# Patient Record
Sex: Male | Born: 1965 | Race: White | Hispanic: No | Marital: Married | Smoking: Current every day smoker
Health system: Southern US, Community
[De-identification: ages and names within clinical notes are randomized; demographics above are authoritative.]

## PROBLEM LIST (undated history)

## (undated) DIAGNOSIS — E119 Type 2 diabetes mellitus without complications: Secondary | ICD-10-CM

---

## 2004-11-13 ENCOUNTER — Emergency Department: Payer: Self-pay | Admitting: Emergency Medicine

## 2012-02-10 ENCOUNTER — Emergency Department: Payer: Self-pay | Admitting: Unknown Physician Specialty

## 2012-02-14 ENCOUNTER — Emergency Department: Payer: Self-pay | Admitting: Emergency Medicine

## 2012-12-30 ENCOUNTER — Emergency Department: Payer: Self-pay | Admitting: Emergency Medicine

## 2012-12-30 LAB — BASIC METABOLIC PANEL
Calcium, Total: 9.1 mg/dL (ref 8.5–10.1)
Chloride: 109 mmol/L — ABNORMAL HIGH (ref 98–107)
Co2: 26 mmol/L (ref 21–32)
Creatinine: 0.87 mg/dL (ref 0.60–1.30)
EGFR (African American): >60
EGFR (Non-African Amer.): >60
Glucose: 134 mg/dL — ABNORMAL HIGH (ref 65–99)
Osmolality: 281 (ref 275–301)
Potassium: 3.7 mmol/L (ref 3.5–5.1)

## 2012-12-30 LAB — CBC
HCT: 47.2 % (ref 40.0–52.0)
MCH: 32.7 pg (ref 26.0–34.0)
RBC: 5.06 10*6/uL (ref 4.40–5.90)

## 2012-12-30 LAB — URINALYSIS, COMPLETE
Bacteria: NONE SEEN
Glucose,UR: NEGATIVE mg/dL (ref 0–75)
Leukocyte Esterase: NEGATIVE
Nitrite: NEGATIVE
RBC,UR: 121 /HPF (ref 0–5)
Specific Gravity: 1.017 (ref 1.003–1.030)
Squamous Epithelial: <1
WBC UR: 10 /HPF (ref 0–5)

## 2012-12-30 LAB — HEPATIC FUNCTION PANEL A (ARMC)
Albumin: 3.9 g/dL (ref 3.4–5.0)
Alkaline Phosphatase: 97 U/L (ref 50–136)
Bilirubin,Total: 0.3 mg/dL (ref 0.2–1.0)
SGOT(AST): 44 U/L — ABNORMAL HIGH (ref 15–37)
Total Protein: 7.7 g/dL (ref 6.4–8.2)

## 2013-01-19 ENCOUNTER — Inpatient Hospital Stay: Payer: Self-pay | Admitting: Internal Medicine

## 2013-01-19 LAB — CBC
HCT: 42.9 % (ref 40.0–52.0)
MCHC: 35 g/dL (ref 32.0–36.0)
MCV: 94 fL (ref 80–100)
Platelet: 190 10*3/uL (ref 150–440)
RDW: 12.8 % (ref 11.5–14.5)
WBC: 13 10*3/uL — ABNORMAL HIGH (ref 3.8–10.6)

## 2013-01-19 LAB — SEDIMENTATION RATE: Erythrocyte Sed Rate: 20 mm/hr — ABNORMAL HIGH (ref 0–15)

## 2013-01-19 LAB — BASIC METABOLIC PANEL
Anion Gap: 7 (ref 7–16)
BUN: 12 mg/dL (ref 7–18)
Calcium, Total: 8.7 mg/dL (ref 8.5–10.1)
Chloride: 112 mmol/L — ABNORMAL HIGH (ref 98–107)
Creatinine: 0.64 mg/dL (ref 0.60–1.30)
EGFR (Non-African Amer.): >60
Potassium: 3.6 mmol/L (ref 3.5–5.1)

## 2013-01-20 LAB — CBC WITH DIFFERENTIAL/PLATELET
Basophil #: 0.1 10*3/uL (ref 0.0–0.1)
Eosinophil %: 3.7 %
MCH: 32.5 pg (ref 26.0–34.0)
MCHC: 34.9 g/dL (ref 32.0–36.0)
MCV: 93 fL (ref 80–100)
Monocyte #: 0.9 x10 3/mm (ref 0.2–1.0)
Monocyte %: 8.8 %
Neutrophil #: 6.4 10*3/uL (ref 1.4–6.5)
Neutrophil %: 65.1 %
RDW: 12.8 % (ref 11.5–14.5)

## 2013-01-24 LAB — CULTURE, BLOOD (SINGLE)

## 2013-10-21 ENCOUNTER — Emergency Department: Payer: Self-pay | Admitting: Emergency Medicine

## 2014-01-09 ENCOUNTER — Emergency Department: Payer: Self-pay | Admitting: Student

## 2014-03-09 IMAGING — CR DG HAND COMPLETE 3+V*L*
1 series · 3 of 3 positions shown · non-contrast
Comparison: none

REASON FOR EXAM: hand pain
COMMENTS:

PROCEDURE:     DXR - DXR HAND LT COMPLETE  W/OBLIQUES  - January 19, 2013  [DATE]
RESULT:     Left hand images demonstrate no fracture, dislocation or
radiopaque foreign body.

[Series 1: pa · 0.17mm/px · 3 of 3 slices shown]
[im 1/3]
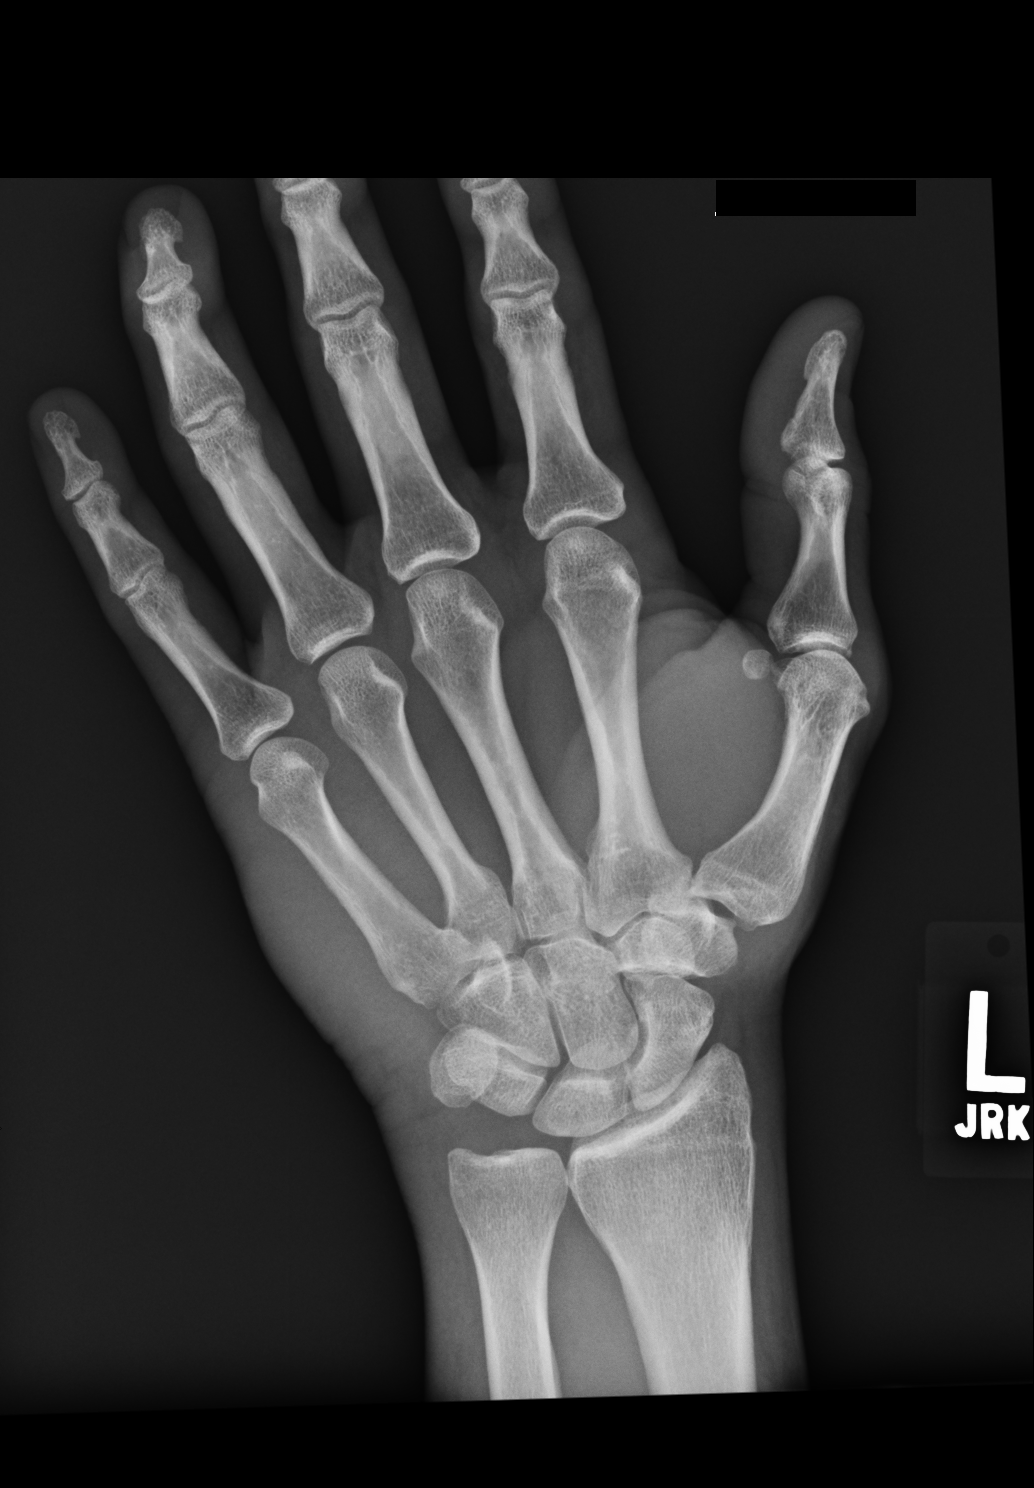
[im 2/3]
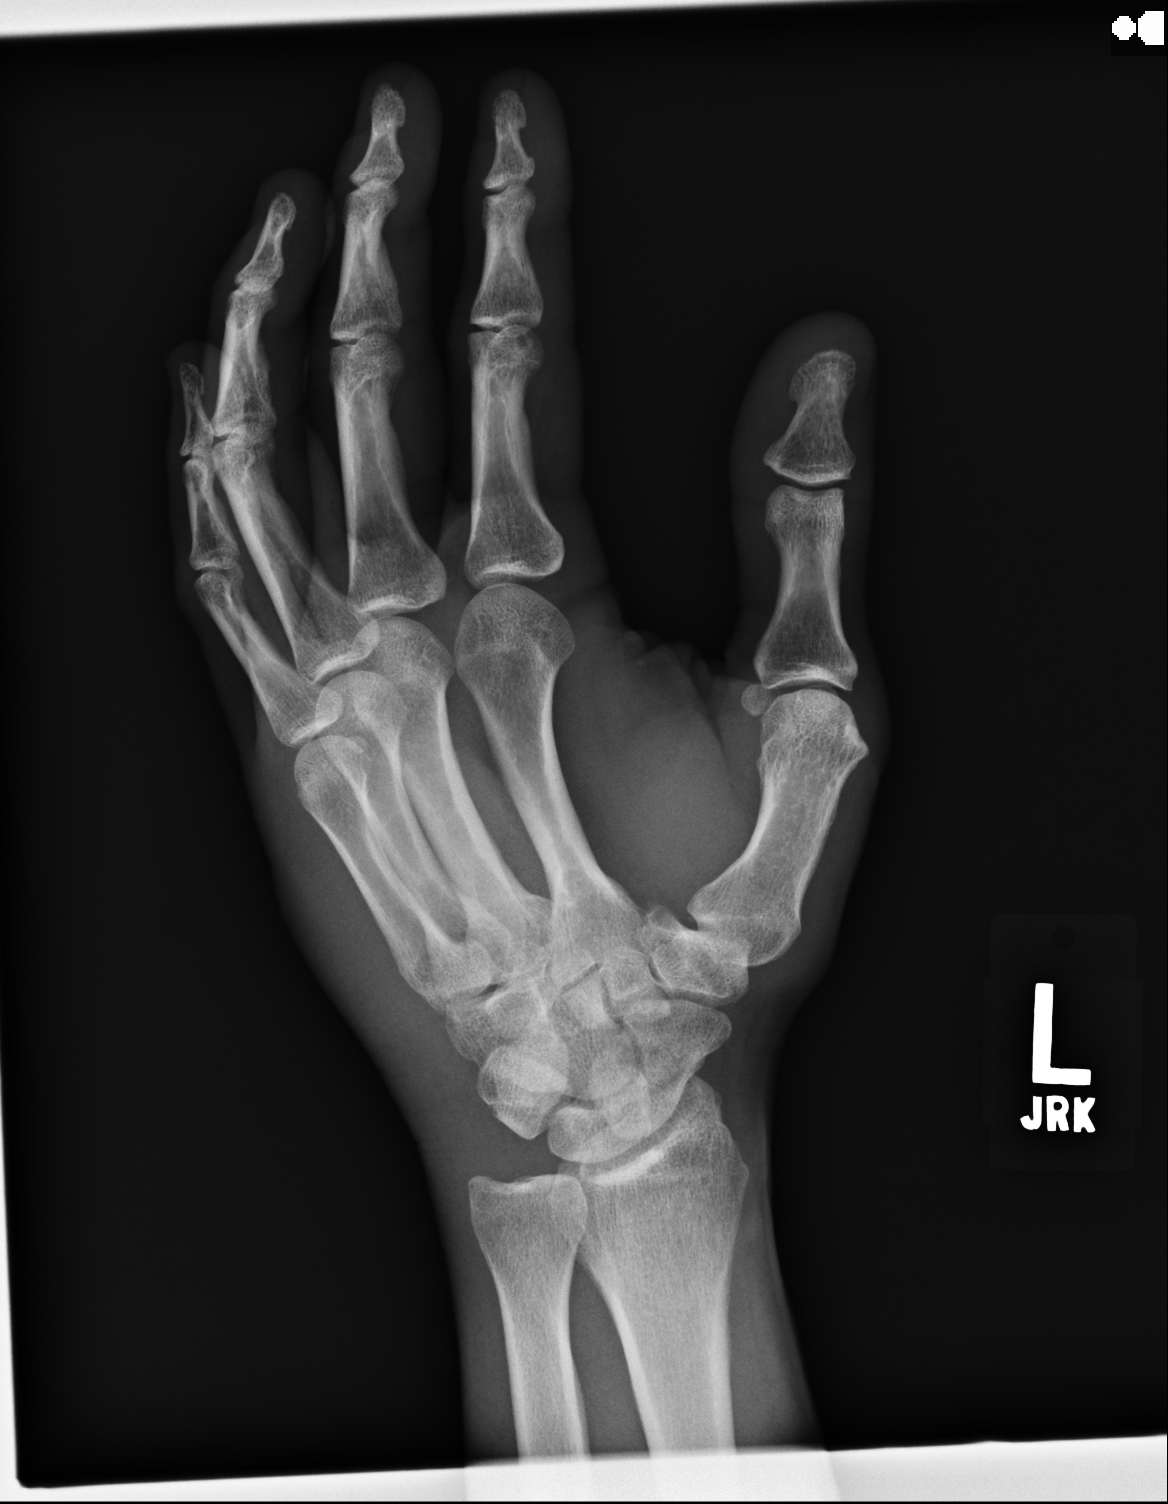
[im 3/3]
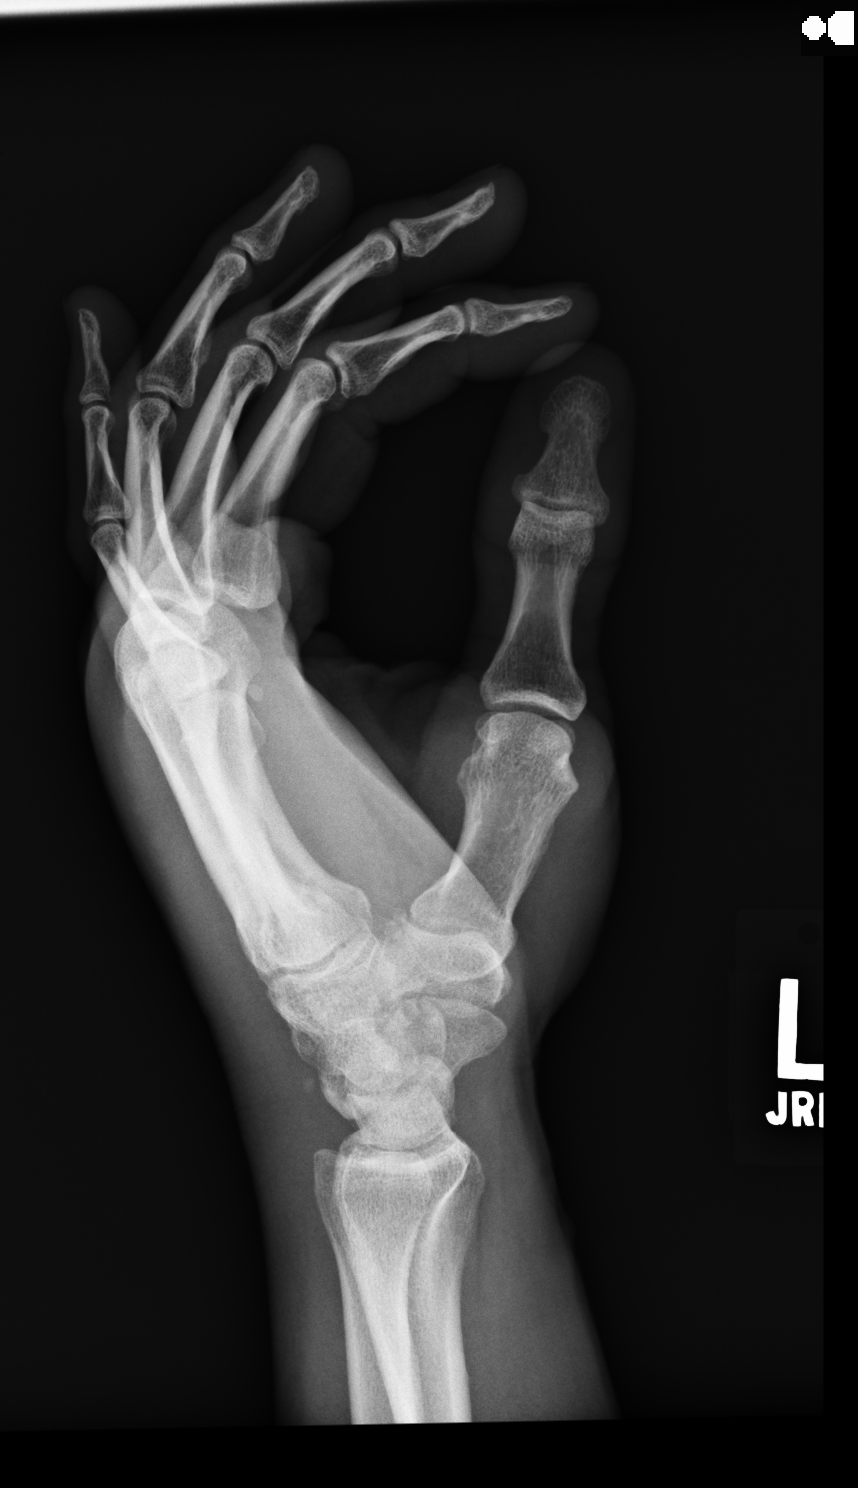

[3 of 3 positions shown; findings below may reference images not displayed]

IMPRESSION: No acute bony abnormality evident.

[REDACTED]

## 2014-09-11 NOTE — H&P (Signed)
PATIENT NAME:  Earl Moreno, Earl Moreno MR#:  [id number] DATE OF BIRTH:  May 25, 1965  DATE OF ADMISSION:  01/19/2013  PRIMARY CARE PHYSICIAN: None.   CHIEF COMPLAINT: Left hand pain.   HISTORY OF PRESENT ILLNESS: This is a 49 year old male who has no significant past medical history. He states Saturday there was a bump that came up on his left hand on the dorsal aspect. Yesterday he popped the bump, squeezed some fluid out of it. Today it became swollen with pain in his hand and his forearm and redness and swelling. He has had no reported fever. We are asked to admit him for IV antibiotic therapy.   PAST MEDICAL HISTORY: No known medical problems.   PAST SURGICAL HISTORY: None.   ALLERGIES: No known drug allergies.   CURRENT MEDICATIONS: None.   SOCIAL HISTORY: He smokes about two cigarettes a day. Does not drink alcohol.   FAMILY HISTORY: Significant for hypertension and diabetes.   REVIEW OF SYSTEMS:  CONSTITUTIONAL: No fever or chills.  EYES: No blurred vision.  ENT: No hearing loss.  CARDIOVASCULAR: No chest pain.  PULMONARY: No shortness of breath.  GI: No nausea, vomiting, or diarrhea.  GU: No dysuria.  ENDOCRINE: No heat or cold intolerance.  INTEGUMENT: No rash.   MUSCULOSKELETAL:  Some joint pain.  NEUROLOGIC: No numbness or weakness.   PHYSICAL EXAMINATION: VITAL SIGNS: Temperature 98.4, pulse 98, respirations 20, blood pressure 158/97.  GENERAL: This is a well-nourished white male in no acute distress.  HEENT: Pupils are equal, round, and reactive to light. Sclerae anicteric. Oral mucosa is moist.  Oropharynx is clear. Nasopharynx is clear.  NECK: Supple. No JVD, lymphadenopathy or thyromegaly.  CARDIOVASCULAR: Regular rate and rhythm. No murmurs, rubs, or gallops.  LUNGS: Clear to auscultation. No dullness to percussion. He is not using accessory muscles.  ABDOMEN: Soft, nontender, nondistended. Bowel sounds are positive. No hepatosplenomegaly. No masses.  EXTREMITIES: The  left hand is edematous. There is an indurated area on the lateral dorsal side of his hand. There is erythema on his forearm with tenderness in the area. He is able to grip and extend and flex all the fingers.  NEUROLOGIC: Cranial nerves II through XII appear to be intact. He is alert and oriented x 4.  SKIN: Moist with no rash. He does have multiple tattoos on both forearms and the changes in the left hand as described above.   LABORATORY, DIAGNOSTIC AND RADIOLOGICAL DATA: BUN 12, creatinine 0.64, sodium 140, potassium 3.6, white blood cells 13, hemoglobin is 15.   ASSESSMENT AND PLAN: 1.  Left hand cellulitis. He still has a mild indurated area for that may have possible abscess. We will go ahead and get a plain films and ultrasound. He still has good motion with that hand and is able to extend and flex the fingers, so I am doubting he has a fasciitis at this point,  but made may need orthopedic follow-up if not improving and also after we get the films, we will discuss with orthopedics. I will start him on IV vancomycin and Zosyn.  Blood cultures have been ordered.  2.  Elevated blood pressure. He has no history of hypertension. I suspect this is from anxiety of being in the hospital and probably from pain in his left hand.   TIME SPENT ON ADMISSION: 35 minutes    ____________________________ Gracelyn Nurse, MD jdj:cc D: 01/19/2013 20:47:04 ET T: 01/19/2013 21:50:02 ET JOB#: [id number]  cc: Gracelyn Nurse, MD, <Dictator> Gracelyn Nurse  MD ELECTRONICALLY SIGNED 01/29/2013 9:42

## 2014-09-11 NOTE — Discharge Summary (Signed)
PATIENT NAME:  Earl Moreno, PULTZ MR#:  [id number] DATE OF BIRTH:  28-Apr-1966  DATE OF ADMISSION:  01/19/2013 DATE OF DISCHARGE:  01/20/2013  The patient left against medical advice 01/20/2013.    HISTORY OF PRESENTING ILLNESS: A 49 year old male with no significant past medical history. He had a bump or a small boil on his left hand, on the dorsal aspect, and he burst the bump and squeezed some fluid out of it, but then it started getting swollen and it was extremely painful on his hand and forearm, redness and swelling, so he decided to come to the Emergency Room, no fever, and so he was admitted for IV antibiotics for possibly some abscess. The patient was started on vancomycin and Zosyn IV for his possible cellulitis and admitted to regular floor for further management. On sonogram of his hand, there was 3 cm x 0.66 cm x 1.43 cm fluid collection, possibly abscess in his hand noticed, and so the plan was to call the orthopedic consult and let them do drainage of the abscess. The patient did not want to stay for it, as it was a holiday weekend, and he has his daughter visiting his home, and he wanted to go home. We advised him to stay because his abscess needed to be drained.  He and his wife were in the room and they agreed that they would not like to stay, but they will go to [facility] ER for this matter within a day or 2 day once their family gathering is over. I examined the patient in the morning in the room, and he had his left hand swallow and redness was up to wrist. He was able to move his finger a little bit but limited due to excessive stretching due to swelling and pain. Sensations were preserved on left arm and fingers.   PHYSICAL EXAMINATION:  GENERAL:  He was alert and oriented.  CARDIOLOGY: He had S1 and S2 present, regular. No murmur.  RESPIRATORY:  There was no wheezing, bilateral clear air entry was present.   VITAL SIGNS: He did not have any fever. Temperature 97.9, pulse was 66 and blood  pressure was 155/90. His pulse ox was 98% on room air.  EXTREMITIES:  He was able to move all 4 limbs and did not have any focal weakness or tremors.   IMPORTANT LABORATORY RESULTS: His creatinine was 0.64 when he came in. Potassium was 3.6. Blood culture was negative. White cell count was 13,000 when he came in. Hemoglobin was 15. ESR was 20. Blood culture was negative. White cell count was 9.9 next day morning, and sonogram result of soft tissue hand as mentioned above. Again, this was a discharge against medical advice.   FINAL DIAGNOSIS: Cellulitis, possible abscess of left hand. No medication were given as the patient signed against medical advice.   TOTAL TIME SPENT ON THIS DISCHARGE: 35 minutes.   ____________________________ Ceasar Lund Anselm Jungling, MD vgv:dmm D: 01/24/2013 10:22:51 ET T: 01/24/2013 10:39:51 ET JOB#: [id number]  cc: Ceasar Lund. Anselm Jungling, MD, <Dictator> Vaughan Basta MD ELECTRONICALLY SIGNED 01/25/2013 15:39

## 2014-09-11 NOTE — Consult Note (Signed)
Brief Consult Note: Diagnosis: Left hand infection.   Comments: Was called at 230pm 01/20/2013 for possible hand infection; came to see Pt after OR case was finished at 320pm; Pt had signed out AMA.  Told By nursing and Dr. Desiree HaneVachani that the Pt wanted to spend the holiday at home and will follow up closer to home.  Electronic Signatures: Weyman PedroGallizzi, Prudencio Velazco A (MD)  (Signed 01-Sep-14 15:32)  Authored: Brief Consult Note   Last Updated: 01-Sep-14 15:32 by Weyman PedroGallizzi, Neilan Rizzo A (MD)

## 2014-10-29 ENCOUNTER — Encounter: Payer: Self-pay | Admitting: Emergency Medicine

## 2014-10-29 ENCOUNTER — Emergency Department: Payer: Self-pay

## 2014-10-29 ENCOUNTER — Emergency Department
Admission: EM | Admit: 2014-10-29 | Discharge: 2014-10-29 | Disposition: A | Discharge status: Home / Self Care | Payer: Self-pay | Attending: Emergency Medicine | Admitting: Emergency Medicine

## 2014-10-29 ENCOUNTER — Other Ambulatory Visit: Payer: Self-pay

## 2014-10-29 DIAGNOSIS — M545 Low back pain, unspecified: Secondary | ICD-10-CM

## 2014-10-29 DIAGNOSIS — Z720 Tobacco use: Secondary | ICD-10-CM | POA: Insufficient documentation

## 2014-10-29 LAB — COMPREHENSIVE METABOLIC PANEL
ALBUMIN: 3.8 g/dL (ref 3.5–5.0)
ALK PHOS: 85 U/L (ref 38–126)
ALT: 13 U/L — ABNORMAL LOW (ref 17–63)
AST: 20 U/L (ref 15–41)
Anion gap: 8 (ref 5–15)
BUN: 8 mg/dL (ref 6–20)
CO2: 24 mmol/L (ref 22–32)
CREATININE: 0.8 mg/dL (ref 0.61–1.24)
Calcium: 9.4 mg/dL (ref 8.9–10.3)
Chloride: 108 mmol/L (ref 101–111)
GFR calc Af Amer: >60 mL/min (ref >60)
GFR calc non Af Amer: >60 mL/min (ref >60)
GLUCOSE: 104 mg/dL — AB (ref 65–99)
Potassium: 4 mmol/L (ref 3.5–5.1)
Sodium: 140 mmol/L (ref 135–145)
TOTAL PROTEIN: 8.1 g/dL (ref 6.5–8.1)
Total Bilirubin: 0.3 mg/dL (ref 0.3–1.2)

## 2014-10-29 LAB — URINALYSIS COMPLETE WITH MICROSCOPIC (ARMC ONLY)
Bacteria, UA: NONE SEEN
Bilirubin Urine: NEGATIVE
GLUCOSE, UA: NEGATIVE mg/dL
Hgb urine dipstick: NEGATIVE
KETONES UR: NEGATIVE mg/dL
Leukocytes, UA: NEGATIVE
Nitrite: NEGATIVE
Protein, ur: NEGATIVE mg/dL
SPECIFIC GRAVITY, URINE: 1.013 (ref 1.005–1.030)
SQUAMOUS EPITHELIAL / LPF: NONE SEEN
pH: 8 (ref 5.0–8.0)

## 2014-10-29 LAB — CBC
HCT: 44.4 % (ref 40.0–52.0)
HEMOGLOBIN: 14.7 g/dL (ref 13.0–18.0)
MCH: 29.8 pg (ref 26.0–34.0)
MCHC: 33.2 g/dL (ref 32.0–36.0)
MCV: 89.7 fL (ref 80.0–100.0)
Platelets: 257 10*3/uL (ref 150–440)
RBC: 4.95 MIL/uL (ref 4.40–5.90)
RDW: 13.5 % (ref 11.5–14.5)
WBC: 7.2 10*3/uL (ref 3.8–10.6)

## 2014-10-29 LAB — TROPONIN I: Troponin I: <0.03 ng/mL (ref <0.031)

## 2014-10-29 MED ORDER — OXYCODONE-ACETAMINOPHEN 5-325 MG PO TABS
ORAL_TABLET | ORAL | Status: AC
  Administered 2014-10-29: 2 via ORAL
  Filled 2014-10-29: qty 2

## 2014-10-29 MED ORDER — OXYCODONE-ACETAMINOPHEN 5-325 MG PO TABS
2.0000 | ORAL_TABLET | Freq: Once | ORAL | Status: AC
  Administered 2014-10-29: 2 via ORAL

## 2014-10-29 MED ORDER — HYDROMORPHONE HCL 1 MG/ML IJ SOLN
1.0000 mg | Freq: Once | INTRAMUSCULAR | Status: AC
Start: 2014-10-29 — End: 2014-10-29
  Administered 2014-10-29: 1 mg via INTRAMUSCULAR

## 2014-10-29 MED ORDER — HYDROCODONE-ACETAMINOPHEN 5-325 MG PO TABS: 1.0000 | ORAL_TABLET | ORAL | Status: DC | PRN

## 2014-10-29 MED ORDER — CYCLOBENZAPRINE HCL 5 MG PO TABS: 5.0000 mg | ORAL_TABLET | Freq: Three times a day (TID) | ORAL | Status: AC | PRN

## 2014-10-29 MED ORDER — HYDROMORPHONE HCL 1 MG/ML IJ SOLN
INTRAMUSCULAR | Status: AC
  Administered 2014-10-29: 1 mg via INTRAMUSCULAR
  Filled 2014-10-29: qty 1

## 2014-10-29 NOTE — ED Provider Notes (Signed)
[facility] Emergency Department Provider Note  Time seen: 3:15 PM  I have reviewed the triage vital signs and the nursing notes.   HISTORY  Chief Complaint Back Pain    HPI EULISES KIJOWSKI is a 49 y.o. male who presents the emergency department with lower back pain. According to the patient he is having lower back pain for the past 3-4 days which is gradually worsened. The pain is much worse when moving, twisting, or trying to bend over and stand up. He does not recall an inciting event. Patient does state some trouble urinating, which she describes as trouble initiating a stream. This has been going on for several years per the patient. Denies any dysuria, hematuria. The patient has had kidney stones in the past which this feels somewhat similar to but this feels much worse. Denies any worsening with food. Denies any vomiting or diarrhea but does state he feels nauseated. He describes his back pain as severe, aching/dull, sharp when he moves.    History reviewed. No pertinent past medical history.  There are no active problems to display for this patient.   History reviewed. No pertinent past surgical history.  No current outpatient prescriptions on file.  Allergies Review of patient's allergies indicates no known allergies.  No family history on file.  Social History History  Substance Use Topics  . Smoking status: Current Every Day Smoker -- 0.50 packs/day  . Smokeless tobacco: Never Used  . Alcohol Use: No    Review of Systems Constitutional: Negative for fever. Cardiovascular: Negative for chest pain. Respiratory: Negative for shortness of breath. Gastrointestinal: Negative for abdominal pain Genitourinary: Negative for dysuria. Positive for trouble initiating urination Musculoskeletal: Positive for lower back pain Neurological: Negative for headaches, focal weakness or numbness.  10-point ROS otherwise  negative.  ____________________________________________   PHYSICAL EXAM:  VITAL SIGNS: ED Triage Vitals  Enc Vitals Group     BP 10/29/14 1344 141/103 mmHg     Pulse Rate 10/29/14 1344 90     Resp 10/29/14 1344 18     Temp 10/29/14 1344 98.2 F (36.8 C)     Temp Source 10/29/14 1344 Oral     SpO2 10/29/14 1344 99 %     Weight 10/29/14 1344 156 lb (70.761 kg)     Height 10/29/14 1344  (1.753 m)     Head Cir --      Peak Flow --      Pain Score 10/29/14 1345 10     Pain Loc --      Pain Edu? --      Excl. in GC? --     Constitutional: Alert and oriented. Well appearing and in no distress. Eyes: Normal exam ENT   Head: Normocephalic and atraumatic.   Mouth/Throat: Mucous membranes are moist. Cardiovascular: Normal rate, regular rhythm. No murmur Respiratory: Normal respiratory effort without tachypnea nor retractions. Breath sounds are clear  Gastrointestinal: Soft and nontender. No distention.  Mild left-sided CVA tenderness palpation Musculoskeletal: Moderate lumbar paraspinal tenderness to palpation. No midline tenderness to palpation. Neurologic:  Normal speech and language. No gross focal neurologic deficits Skin:  Skin is warm, dry and intact.  Psychiatric: Mood and affect are normal. Speech and behavior are normal.   ____________________________________________    EKG  EKG reviewed and interpreted by myself shows normal sinus rhythm at 87 bpm, narrow QRS, normal axis, normal intervals, no concerning ST changes noted.  ____________________________________________    RADIOLOGY  CT scan  shows small amount of free fluid in the pelvis, nonspecific. No acute abnormalities on CT.  ____________________________________________    INITIAL IMPRESSION / ASSESSMENT AND PLAN / ED COURSE  Pertinent labs & imaging results that were available during my care of the patient were reviewed by me and considered in my medical decision making (see chart for  details).  49 year old male with lower back pain 3-4 days also proximally one to 2 years of difficulty urinating. Denies any inciting event that he knows of. Patient's urinalysis appears within normal limits. Currently awaiting blood work, and CT scan results. Labs are within normal limits. CT scan largely within normal limits besides a mild amount of free fluid in the pelvis. I discussed with the patient need to follow up with her primary care doctor and they plan to do so at [facility]. Discussed strict return precautions to which they are agreeable. We will treat as musculoskeletal pain with Norco and Flexeril and have the patient follow-up with a primary care doctor. ____________________________________________   FINAL CLINICAL IMPRESSION(S) / ED DIAGNOSES  Lower back pain   Minna Antis, MD 10/29/14 1558

## 2014-10-29 NOTE — Discharge Instructions (Signed)
Back Pain, Adult °Low back pain is very common. About 1 in 5 people have back pain. The cause of low back pain is rarely dangerous. The pain often gets better over time. About half of people with a sudden onset of back pain feel better in just 2 weeks. About 8 in 10 people feel better by 6 weeks.  °CAUSES °Some common causes of back pain include: °· Strain of the muscles or ligaments supporting the spine. °· Wear and tear (degeneration) of the spinal discs. °· Arthritis. °· Direct injury to the back. °DIAGNOSIS °Most of the time, the direct cause of low back pain is not known. However, back pain can be treated effectively even when the exact cause of the pain is unknown. Answering your caregiver's questions about your overall health and symptoms is one of the most accurate ways to make sure the cause of your pain is not dangerous. If your caregiver needs more information, he or she may order lab work or imaging tests (X-rays or MRIs). However, even if imaging tests show changes in your back, this usually does not require surgery. °HOME CARE INSTRUCTIONS °For many people, back pain returns. Since low back pain is rarely dangerous, it is often a condition that people can learn to manage on their own.  °· Remain active. It is stressful on the back to sit or stand in one place. Do not sit, drive, or stand in one place for more than 30 minutes at a time. Take short walks on level surfaces as soon as pain allows. Try to increase the length of time you walk each day. °· Do not stay in bed. Resting more than 1 or 2 days can delay your recovery. °· Do not avoid exercise or work. Your body is made to move. It is not dangerous to be active, even though your back may hurt. Your back will likely heal faster if you return to being active before your pain is gone. °· Pay attention to your body when you  bend and lift. Many people have less discomfort when lifting if they bend their knees, keep the load close to their bodies, and  avoid twisting. Often, the most comfortable positions are those that put less stress on your recovering back. °· Find a comfortable position to sleep. Use a firm mattress and lie on your side with your knees slightly bent. If you lie on your back, put a pillow under your knees. °· Only take over-the-counter or prescription medicines as directed by your caregiver. Over-the-counter medicines to reduce pain and inflammation are often the most helpful. Your caregiver may prescribe muscle relaxant drugs. These medicines help dull your pain so you can more quickly return to your normal activities and healthy exercise. °· Put ice on the injured area. °¨ Put ice in a plastic bag. °¨ Place a towel between your skin and the bag. °¨ Leave the ice on for 15-20 minutes, 03-04 times a day for the first 2 to 3 days. After that, ice and heat may be alternated to reduce pain and spasms. °· Ask your caregiver about trying back exercises and gentle massage. This may be of some benefit. °· Avoid feeling anxious or stressed. Stress increases muscle tension and can worsen back pain. It is important to recognize when you are anxious or stressed and learn ways to manage it. Exercise is a great option. °SEEK MEDICAL CARE IF: °· You have pain that is not relieved with rest or medicine. °· You have pain that does not improve in 1 week. °· You have new symptoms. °· You are generally not feeling well. °SEEK   IMMEDIATE MEDICAL CARE IF:   You have pain that radiates from your back into your legs.  You develop new bowel or bladder control problems.  You have unusual weakness or numbness in your arms or legs.  You develop nausea or vomiting.  You develop abdominal pain.  You feel faint. Document Released: 05/08/2005 Document Revised: 11/07/2011 Document Reviewed: 09/09/2013 [facility] Patient Information 2015 [place], [place]. This information is not intended to replace advice given to you by your health care provider. Make sure you  discuss any questions you have with your health care provider.     As we have discussed your labs and CT results today show largely normal results. But it is very important he follow-up with a primary care physician for further workup of your urinary complaints, and to further evaluate your prostate to rule out things like prostate cancer. Please return to the emergency department if you have any worsening pain, numbness or weakness of any arm or leg, if you're unable to urinate, or any other personally concerning symptoms.

## 2014-10-29 NOTE — ED Notes (Signed)
Pt states he is having mid back pain, radiating around to his ribs/abd area, also having difficulty urinating occasionally, does have a hx of kidney stones.

## 2015-01-06 ENCOUNTER — Encounter (HOSPITAL_COMMUNITY): Payer: Self-pay | Admitting: Emergency Medicine

## 2015-01-06 ENCOUNTER — Emergency Department (HOSPITAL_COMMUNITY)
Admission: EM | Admit: 2015-01-06 | Discharge: 2015-01-07 | Disposition: A | Discharge status: Home / Self Care | Payer: Self-pay | Attending: Emergency Medicine | Admitting: Emergency Medicine

## 2015-01-06 ENCOUNTER — Emergency Department (HOSPITAL_COMMUNITY): Payer: Self-pay

## 2015-01-06 DIAGNOSIS — Z7982 Long term (current) use of aspirin: Secondary | ICD-10-CM | POA: Insufficient documentation

## 2015-01-06 DIAGNOSIS — R079 Chest pain, unspecified: Secondary | ICD-10-CM | POA: Insufficient documentation

## 2015-01-06 DIAGNOSIS — M79602 Pain in left arm: Secondary | ICD-10-CM | POA: Insufficient documentation

## 2015-01-06 DIAGNOSIS — Z720 Tobacco use: Secondary | ICD-10-CM | POA: Insufficient documentation

## 2015-01-06 DIAGNOSIS — F1410 Cocaine abuse, uncomplicated: Secondary | ICD-10-CM | POA: Insufficient documentation

## 2015-01-06 DIAGNOSIS — R0602 Shortness of breath: Secondary | ICD-10-CM | POA: Insufficient documentation

## 2015-01-06 LAB — BASIC METABOLIC PANEL
Anion gap: 9 (ref 5–15)
BUN: 9 mg/dL (ref 6–20)
CO2: 26 mmol/L (ref 22–32)
CREATININE: 0.98 mg/dL (ref 0.61–1.24)
Calcium: 9.1 mg/dL (ref 8.9–10.3)
Chloride: 106 mmol/L (ref 101–111)
GFR calc non Af Amer: >60 mL/min (ref >60)
Glucose, Bld: 112 mg/dL — ABNORMAL HIGH (ref 65–99)
Potassium: 3.9 mmol/L (ref 3.5–5.1)
Sodium: 141 mmol/L (ref 135–145)

## 2015-01-06 LAB — CBC
HCT: 41.2 % (ref 39.0–52.0)
Hemoglobin: 14.2 g/dL (ref 13.0–17.0)
MCH: 30.9 pg (ref 26.0–34.0)
MCHC: 34.5 g/dL (ref 30.0–36.0)
MCV: 89.8 fL (ref 78.0–100.0)
PLATELETS: 215 10*3/uL (ref 150–400)
RBC: 4.59 MIL/uL (ref 4.22–5.81)
RDW: 13 % (ref 11.5–15.5)
WBC: 7.5 10*3/uL (ref 4.0–10.5)

## 2015-01-06 LAB — I-STAT TROPONIN, ED: Troponin i, poc: 0 ng/mL (ref 0.00–0.08)

## 2015-01-06 NOTE — ED Notes (Signed)
C/o pain from L elbow that radiates up L arm, into L side of neck, and L chest x 3 days.  Also reports sob and dizziness.  Denies nausea and vomiting.  Last used Heroin yesterday.

## 2015-01-07 ENCOUNTER — Encounter (HOSPITAL_COMMUNITY): Payer: Self-pay | Admitting: Emergency Medicine

## 2015-01-07 LAB — RAPID URINE DRUG SCREEN, HOSP PERFORMED
Amphetamines: NOT DETECTED
BENZODIAZEPINES: NOT DETECTED
Barbiturates: NOT DETECTED
Cocaine: POSITIVE — AB
Opiates: POSITIVE — AB
Tetrahydrocannabinol: NOT DETECTED

## 2015-01-07 LAB — I-STAT TROPONIN, ED: Troponin i, poc: 0 ng/mL (ref 0.00–0.08)

## 2015-01-07 MED ORDER — LORAZEPAM 2 MG/ML IJ SOLN
2.0000 mg | Freq: Once | INTRAMUSCULAR | Status: AC
  Administered 2015-01-07: 2 mg via INTRAVENOUS
  Filled 2015-01-07: qty 1

## 2015-01-07 MED ORDER — ASPIRIN 81 MG PO CHEW: 81.0000 mg | CHEWABLE_TABLET | Freq: Every day | ORAL | Status: DC

## 2015-01-07 MED ORDER — ASPIRIN 81 MG PO CHEW
324.0000 mg | CHEWABLE_TABLET | Freq: Once | ORAL | Status: AC
  Administered 2015-01-07: 324 mg via ORAL
  Filled 2015-01-07: qty 4

## 2015-01-07 MED ORDER — IBUPROFEN 600 MG PO TABS: 600.0000 mg | ORAL_TABLET | Freq: Four times a day (QID) | ORAL | Status: DC | PRN

## 2015-01-07 MED ORDER — OXYCODONE-ACETAMINOPHEN 5-325 MG PO TABS
1.0000 | ORAL_TABLET | Freq: Once | ORAL | Status: AC
  Administered 2015-01-07: 1 via ORAL
  Filled 2015-01-07: qty 1

## 2015-01-07 MED ORDER — FENTANYL CITRATE (PF) 100 MCG/2ML IJ SOLN
50.0000 ug | Freq: Once | INTRAMUSCULAR | Status: AC
  Administered 2015-01-07: 50 ug via INTRAVENOUS
  Filled 2015-01-07: qty 2

## 2015-01-07 MED ORDER — KETOROLAC TROMETHAMINE 30 MG/ML IJ SOLN
30.0000 mg | Freq: Once | INTRAMUSCULAR | Status: AC
  Administered 2015-01-07: 30 mg via INTRAVENOUS
  Filled 2015-01-07: qty 1

## 2015-01-07 NOTE — Discharge Instructions (Signed)
We saw you in the ER for the chest pain AND arm pain. All of our cardiac workup is normal, including labs, EKG and chest X-RAY are normal. We are not sure what is causing your discomfort, but we feel comfortable sending you home at this time.  The workup in the ER is not complete, and you should follow up with your primary care doctor and a cardiologist for further evaluation.  RETURN TO THE ER IMMEDIATELY IF THE PAIN GETS WORSE, you start sweating, you have shortness of breath, you pass out.  Refrain from cocaine use, it will cause heart attacks and other life threatening conditions.   Aspirin and Your Heart Aspirin affects the way your blood clots and helps "thin" the blood. Aspirin has many uses in heart disease. It may be used as a primary prevention to help reduce the risk of heart related events. It also can be used as a secondary measure to prevent more heart attacks or to prevent additional damage from blood clots.  ASPIRIN MAY HELP IF YOU:  Have had a heart attack or chest pain.  Have undergone open heart surgery such as CABG (Coronary Artery Bypass Surgery).  Have had coronary angioplasty with or without stents.  Have experienced a stroke or TIA (transient ischemic attack).  Have peripheral vascular disease (PAD).  Have chronic heart rhythm problems such as atrial fibrillation.  Are at risk for heart disease. BEFORE STARTING ASPIRIN Before you start taking aspirin, your caregiver will need to review your medical history. Many things will need to be taken into consideration, such as:  Smoking status.  Blood pressure.  Diabetes.  Gender.  Weight.  Cholesterol level. ASPIRIN DOSES  Aspirin should only be taken on the advice of your caregiver. Talk to your caregiver about how much aspirin you should take. Aspirin comes in different doses such as:  81 mg.  162 mg.  325 mg.  The aspirin dose you take may be affected by many factors, some of which  include:  Your current medications, especially if your are taking blood-thinners or anti-platelet medicine.  Liver function.  Heart disease risk.  Age.  Aspirin comes in two forms:  Non-enteric-coated. This type of aspirin does not have a coating and is absorbed faster. Non-enteric coated aspirin is recommended for patients experiencing chest pain symptoms. This type of aspirin also comes in a chewable form.  Enteric-coated. This means the aspirin has a special coating that releases the medicine very slowly. Enteric-coated aspirin causes less stomach upset. This type of aspirin should not be chewed or crushed. ASPIRIN SIDE EFFECTS Daily use of aspirin can increase your risk of serious side effects. Some of these include:  Increased bleeding. This can range from a cut that does not stop bleeding to more serious problems such as stomach bleeding or bleeding into the brain (Intracerebral bleeding).  Increased bruising.  Stomach upset.  An allergic reaction such as red, itchy skin.  Increased risk of bleeding when combined with non-steroidal anti-inflammatory medicine (NSAIDS).  Alcohol should be drank in moderation when taking aspirin. Alcohol can increase the risk of stomach bleeding when taken with aspirin.  Aspirin should not be given to children less than 14 years of age due to the association of Reye syndrome. Reye syndrome is a serious illness that can affect the brain and liver. Studies have linked Reye syndrome with aspirin use in children.  People that have nasal polyps have an increased risk of developing an aspirin allergy. SEEK MEDICAL CARE IF:  You develop an allergic reaction such as:  Hives.  Itchy skin.  Swelling of the lips, tongue or face.  You develop stomach pain.  You have unusual bleeding or bruising.  You have ringing in your ears. SEEK IMMEDIATE MEDICAL CARE IF:   You have severe chest pain, especially if the pain is crushing or pressure-like  and spreads to the arms, back, neck, or jaw. THIS IS AN EMERGENCY. Do not wait to see if the pain will go away. Get medical help at once. Call your local emergency services (911 in the [place] DO NOT drive yourself to the hospital.  You have stroke-like symptoms such as:  Loss of vision.  Difficulty talking.  Numbness or weakness on one side of your body.  Numbness or weakness in your arm or leg.  Not thinking clearly or feeling confused.  Your bowel movements are bloody, dark red or black in color.  You vomit or cough up blood.  You have blood in your urine.  You have shortness of breath, coughing or wheezing. MAKE SURE YOU:   Understand these instructions.  Will monitor your condition.  Seek immediate medical care if necessary. Document Released: 04/20/2008 Document Revised: 09/02/2012 Document Reviewed: 08/13/2013 [facility] Patient Information 2015 [place], [place]. This information is not intended to replace advice given to you by your health care provider. Make sure you discuss any questions you have with your health care provider.  Chest Pain (Nonspecific) It is often hard to give a specific diagnosis for the cause of chest pain. There is always a chance that your pain could be related to something serious, such as a heart attack or a blood clot in the lungs. You need to follow up with your health care provider for further evaluation. CAUSES   Heartburn.  Pneumonia or bronchitis.  Anxiety or stress.  Inflammation around your heart (pericarditis) or lung (pleuritis or pleurisy).  A blood clot in the lung.  A collapsed lung (pneumothorax). It can develop suddenly on its own (spontaneous pneumothorax) or from trauma to the chest.  Shingles infection (herpes zoster virus). The chest wall is composed of bones, muscles, and cartilage. Any of these can be the source of the pain.  The bones can be bruised by injury.  The muscles or cartilage can be strained by coughing  or overwork.  The cartilage can be affected by inflammation and become sore (costochondritis). DIAGNOSIS  Lab tests or other studies may be needed to find the cause of your pain. Your health care provider may have you take a test called an ambulatory electrocardiogram (ECG). An ECG records your heartbeat patterns over a 24-hour period. You may also have other tests, such as:  Transthoracic echocardiogram (TTE). During echocardiography, sound waves are used to evaluate how blood flows through your heart.  Transesophageal echocardiogram (TEE).  Cardiac monitoring. This allows your health care provider to monitor your heart rate and rhythm in real time.  Holter monitor. This is a portable device that records your heartbeat and can help diagnose heart arrhythmias. It allows your health care provider to track your heart activity for several days, if needed.  Stress tests by exercise or by giving medicine that makes the heart beat faster. TREATMENT   Treatment depends on what may be causing your chest pain. Treatment may include:  Acid blockers for heartburn.  Anti-inflammatory medicine.  Pain medicine for inflammatory conditions.  Antibiotics if an infection is present.  You may be advised to change lifestyle habits. This includes stopping  smoking and avoiding alcohol, caffeine, and chocolate.  You may be advised to keep your head raised (elevated) when sleeping. This reduces the chance of acid going backward from your stomach into your esophagus. Most of the time, nonspecific chest pain will improve within 2-3 days with rest and mild pain medicine.  HOME CARE INSTRUCTIONS   If antibiotics were prescribed, take them as directed. Finish them even if you start to feel better.  For the next few days, avoid physical activities that bring on chest pain. Continue physical activities as directed.  Do not use any tobacco products, including cigarettes, chewing tobacco, or electronic  cigarettes.  Avoid drinking alcohol.  Only take medicine as directed by your health care provider.  Follow your health care provider's suggestions for further testing if your chest pain does not go away.  Keep any follow-up appointments you made. If you do not go to an appointment, you could develop lasting (chronic) problems with pain. If there is any problem keeping an appointment, call to reschedule. SEEK MEDICAL CARE IF:   Your chest pain does not go away, even after treatment.  You have a rash with blisters on your chest.  You have a fever. SEEK IMMEDIATE MEDICAL CARE IF:   You have increased chest pain or pain that spreads to your arm, neck, jaw, back, or abdomen.  You have shortness of breath.  You have an increasing cough, or you cough up blood.  You have severe back or abdominal pain.  You feel nauseous or vomit.  You have severe weakness.  You faint.  You have chills. This is an emergency. Do not wait to see if the pain will go away. Get medical help at once. Call your local emergency services (911 in [place] Do not drive yourself to the hospital. MAKE SURE YOU:   Understand these instructions.  Will watch your condition.  Will get help right away if you are not doing well or get worse. Document Released: 02/15/2005 Document Revised: 05/13/2013 Document Reviewed: 12/12/2007 [facility] Patient Information 2015 [place], [place]. This information is not intended to replace advice given to you by your health care provider. Make sure you discuss any questions you have with your health care provider.  Stimulant Use Disorder-Cocaine Cocaine is one of a group of powerful drugs called stimulants. Cocaine has medical uses for stopping nosebleeds and for pain control before minor nose or dental surgery. However, cocaine is misused because of the effects that it produces. These effects include:   A feeling of extreme pleasure.  Alertness.  High energy. Common street  names for cocaine include coke, crack, blow, snow, and nose candy. Cocaine is snorted, dissolved in water and injected, or smoked.  Stimulants are addictive because they activate regions of the brain that produce both the pleasurable sensation of "reward" and psychological dependence. Together, these actions account for loss of control and the rapid development of drug dependence. This means you become ill without the drug (withdrawal) and need to keep using it to function.  Stimulant use disorder is use of stimulants that disrupts your daily life. It disrupts relationships with family and friends and how you do your job. Cocaine increases your blood pressure and heart rate. It can cause a heart attack or stroke. Cocaine can also cause death from irregular heart rate or seizures. SYMPTOMS Symptoms of stimulant use disorder with cocaine include:  Use of cocaine in larger amounts or over a longer period of time than intended.  Unsuccessful attempts to cut  down or control cocaine use.  A lot of time spent obtaining, using, or recovering from the effects of cocaine.  A strong desire or urge to use cocaine (craving).  Continued use of cocaine in spite of major problems at work, school, or home because of use.  Continued use of cocaine in spite of relationship problems because of use.  Giving up or cutting down on important life activities because of cocaine use.  Use of cocaine over and over in situations when it is physically hazardous, such as driving a car.  Continued use of cocaine in spite of a physical problem that is likely related to use. Physical problems can include:  Malnutrition.  Nosebleeds.  Chest pain.  High blood pressure.  A hole that develops between the part of your nose that separates your nostrils (perforated nasal septum).  Lung and kidney damage.  Continued use of cocaine in spite of a mental problem that is likely related to use. Mental problems can  include:  Schizophrenia-like symptoms.  Depression.  Bipolar mood swings.  Anxiety.  Sleep problems.  Need to use more and more cocaine to get the same effect, or lessened effect over time with use of the same amount of cocaine (tolerance).  Having withdrawal symptoms when cocaine use is stopped, or using cocaine to reduce or avoid withdrawal symptoms. Withdrawal symptoms include:  Depressed or irritable mood.  Low energy or restlessness.  Bad dreams.  Poor or excessive sleep.  Increased appetite. DIAGNOSIS Stimulant use disorder is diagnosed by your health care provider. You may be asked questions about your cocaine use and how it affects your life. A physical exam may be done. A drug screen may be ordered. You may be referred to a mental health professional. The diagnosis of stimulant use disorder requires at least two symptoms within 12 months. The type of stimulant use disorder depends on the number of signs and symptoms you have. The type may be:  Mild. Two or three signs and symptoms.  Moderate. Four or five signs and symptoms.  Severe. Six or more signs and symptoms. TREATMENT Treatment for stimulant use disorder is usually provided by mental health professionals with training in substance use disorders. The following options are available:  Counseling or talk therapy. Talk therapy addresses the reasons you use cocaine and ways to keep you from using again. Goals of talk therapy include:  Identifying and avoiding triggers for use.  Handling cravings.  Replacing use with healthy activities.  Support groups. Support groups provide emotional support, advice, and guidance.  Medicine. Certain medicines may decrease cocaine cravings or withdrawal symptoms. HOME CARE INSTRUCTIONS  Take medicines only as directed by your health care provider.  Identify the people and activities that trigger your cocaine use and avoid them.  Keep all follow-up visits as directed by  your health care provider. SEEK MEDICAL CARE IF:  Your symptoms get worse or you relapse.  You are not able to take medicines as directed. SEEK IMMEDIATE MEDICAL CARE IF:  You have serious thoughts about hurting yourself or others.  You have a seizure, chest pain, sudden weakness, or loss of speech or vision. FOR MORE INFORMATION  National Institute on Drug Abuse: http://www.price-smith.com/  Substance Abuse and Mental Health Services Administration: SkateOasis.com.pt Document Released: 05/05/2000 Document Revised: 09/22/2013 Document Reviewed: 05/21/2013 [facility] Patient Information 2015 [place], [place]. This information is not intended to replace advice given to you by your health care provider. Make sure you discuss any questions you have with your  health care provider.

## 2015-01-07 NOTE — ED Notes (Signed)
Patient refused repeat of vital signs, not being attentive during discharge teaching, left room after IV was removed. Discharge teaching given to the wife. Patient states he's going to "go to [place] to see what the hell is going on with my neck since yal aint done nothing". Verified patient left ER, and walked with wife. Security present during patient's yelling, and assisted with discharge.

## 2015-01-07 NOTE — ED Notes (Signed)
Dr. Nanavanati at the [email or web address]  

## 2015-01-07 NOTE — ED Provider Notes (Signed)
CSN: [id number]     Arrival date & time 01/06/15  2154 History  This chart was scribed for Derwood Kaplan, MD by Octavia Heir, ED Scribe. This patient was seen in room D34C/D34C and the patient's care was started at 2:24 AM.    Chief Complaint  Patient presents with  . Chest Pain  . Arm Pain      The history is provided by the patient. No language interpreter was used.   HPI Comments: Earl Moreno is a 49 y.o. male who presents to the Emergency Department complaining of constant, gradual worsening, left sided chest pain onset 2 days ago. Pt states it is a very sharp pain that radiates down his left arm. He has had associated shortness of breath intermittently. Pt states during the day his pain is not as bad but the pain gets worse when he lays down. Pt states he occasionally does IV heroin use and the last time he used cocaine was 2 days ago. He says he snorts cocaine about once a month. Pt is a daily heavy smoker. Pt denies nausea, vomiting, numbness, tingling, weakness and light headedness.  History reviewed. No pertinent past medical history. History reviewed. No pertinent past surgical history. History reviewed. No pertinent family history. Social History  Substance Use Topics  . Smoking status: Current Every Day Smoker -- 0.50 packs/day  . Smokeless tobacco: Never Used  . Alcohol Use: No    Review of Systems  A complete 10 system review of systems was obtained and all systems are negative except as noted in the HPI and PMH.    Allergies  Review of patient's allergies indicates no known allergies.  Home Medications   Prior to Admission medications   Medication Sig Start Date End Date Taking? Authorizing Provider  aspirin 81 MG chewable tablet Chew 1 tablet (81 mg total) by mouth daily. 01/07/15   Derwood Kaplan, MD  cyclobenzaprine (FLEXERIL) 5 MG tablet Take 1 tablet (5 mg total) by mouth every 8 (eight) hours as needed for muscle spasms. Patient not taking: Reported on  01/06/2015 10/29/14 10/29/15  Minna Antis, MD  HYDROcodone-acetaminophen [facility]) 5-325 MG per tablet Take 1 tablet by mouth every 4 (four) hours as needed for moderate pain. Patient not taking: Reported on 01/06/2015 10/29/14   Minna Antis, MD  ibuprofen (ADVIL,MOTRIN) 600 MG tablet Take 1 tablet (600 mg total) by mouth every 6 (six) hours as needed. 01/07/15   Derwood Kaplan, MD   Triage vitals: BP 125/80 mmHg  Pulse 59  Temp(Src) 97.9 F (36.6 C) (Oral)  Resp 11  Ht 5\' 8"  (1.727 m)  Wt 145 lb (65.772 kg)  BMI 22.05 kg/m2  SpO2 96%  Physical Exam  Constitutional: He is oriented to person, place, and time. He appears well-developed and well-nourished.  HENT:  Head: Normocephalic and atraumatic.  Eyes: EOM are normal.  Neck: Normal range of motion.  Cardiovascular: Normal rate, regular rhythm, normal heart sounds and intact distal pulses.   No murmur heard. No murmurs appreciated  Pulmonary/Chest: Effort normal and breath sounds normal. No respiratory distress.  Mild rhonchus breath sounds at the base of the lungs bilaterally  Abdominal: Soft. He exhibits no distension. There is no tenderness.  Musculoskeletal: Normal range of motion.  Pt left chest and arm tenderness is reproduced with resistance to chest abduction and adduction and with lateral adduction  Neurological: He is alert and oriented to person, place, and time.  2+ and equal femoral pulse, 2+ and equal dorsalis  pedis, gross sensory exam for bilateral upper extremities is normal, 2+ equal radial pulse bilaterally Grip strength is normal Cranial 2-12 intact  Skin: Skin is warm and dry.  Psychiatric: He has a normal mood and affect. Judgment normal.  Nursing note and vitals reviewed.   ED Course  Procedures  DIAGNOSTIC STUDIES: Oxygen Saturation is 96% on RA, normal by my interpretation.  COORDINATION OF CARE: 2:34 AM Discussed treatment plan which includes ativan with pt at bedside and pt agreed to  plan.  Labs Review Labs Reviewed  BASIC METABOLIC PANEL - Abnormal; Notable for the following:    Glucose, Bld 112 (*)    All other components within normal limits  URINE RAPID DRUG SCREEN, HOSP PERFORMED - Abnormal; Notable for the following:    Opiates POSITIVE (*)    Cocaine POSITIVE (*)    All other components within normal limits  CBC  I-STAT TROPOININ, ED  Rosezena Sensor, ED    Imaging Review Dg Chest 2 View  01/06/2015   CLINICAL DATA:  Acute onset of left-sided chest pain and shortness of breath. Initial encounter.  EXAM: CHEST  2 VIEW  COMPARISON:  Chest radiograph performed 02/10/2012  FINDINGS: The lungs are well-aerated. Mild chronic peribronchial thickening is noted. There is no evidence of focal opacification, pleural effusion or pneumothorax.  The heart is normal in size; the mediastinal contour is within normal limits. No acute osseous abnormalities are seen.  IMPRESSION: Mild chronic peribronchial thickening noted; lungs otherwise clear.   Electronically Signed   By: Roanna Raider M.D.   On: 01/06/2015 22:27   I have personally reviewed and evaluated these images and lab results as part of my medical decision-making.   EKG Interpretation   Date/Time:  Wednesday January 06 2015 21:58:54 EDT Ventricular Rate:  83 PR Interval:  132 QRS Duration: 82 QT Interval:  362 QTC Calculation: 425 R Axis:   55 Text Interpretation:  Normal sinus rhythm Right atrial enlargement  Borderline ECG No significant change since last tracing Confirmed by  Tacara Hadlock, MD, Jmichael Gille ([id number]) on 01/07/2015 12:30:47 AM      :30 - pt's symptoms improved post ativan - pain down to 3/10. I explained to him that he might be having vasospasm related chest pain. Smoking cessation instruction/counseling given:  counseled patient on the dangers of tobacco use, advised patient to stop smoking, and reviewed strategies to maximize success. Discussion 2-3 minutes. Also advised to stop using iv heroine  and cocaine.  :15 Informed we can watch him a bit longer and get him pain free with iv toradol. Pt started getting "panicky" as he is in a closed room, and prefers going home. Discussed strict return precautions, and pt will be discharged. He agrees with the plan. Cards f/u given as well.   MDM   Final diagnoses:  Cocaine abuse  Left sided chest pain   I personally performed the services described in this documentation, which was scribed in my presence. The recorded information has been reviewed and is accurate.  Pt comes in with cc of chest pain. Pt has hx of cocaine abuse and smoked cigarettes. No other cardiac risk factors besides that.  Differential diagnosis includes: ACS syndrome CHF exacerbation Valvular disorder Myocarditis Pericarditis Pericardial effusion Pneumonia Musculoskeletal pain Dissection Endocarditis  EKG shows no STEMI. Trops x 2 ordered, 1st one is neg. Pt's hx and exam not suggestive of dissection - no murmur, no tachycardia, no focal neuro complains, normal vascular exam for all 4, no back pain  -  all reduces the pretest probability. Pain also reproduced with resistance movement of the shoulder. Will give ativan and aspirin. Will reassess.      Derwood Kaplan, MD 01/07/15 [phone]

## 2015-01-07 NOTE — ED Notes (Signed)
Patient yelling in hallway, ready to leave. Dr. Shyrl Numbers informed as patient is ready to leave

## 2015-01-07 NOTE — ED Notes (Signed)
Full meal provided as patient requested.

## 2016-02-14 ENCOUNTER — Other Ambulatory Visit: Payer: Self-pay | Admitting: *Deleted

## 2016-02-14 ENCOUNTER — Ambulatory Visit
Admission: RE | Admit: 2016-02-14 | Discharge: 2016-02-14 | Disposition: A | Discharge status: Home / Self Care | Payer: Disability Insurance | Source: Ambulatory Visit | Attending: *Deleted | Admitting: *Deleted

## 2016-02-14 ENCOUNTER — Ambulatory Visit
Admission: RE | Admit: 2016-02-14 | Discharge: 2016-02-14 | Disposition: A | Discharge status: Home / Self Care | Payer: Disability Insurance | Source: Ambulatory Visit | Attending: Obstetrics and Gynecology | Admitting: Obstetrics and Gynecology

## 2016-02-14 DIAGNOSIS — M25639 Stiffness of unspecified wrist, not elsewhere classified: Secondary | ICD-10-CM

## 2016-02-14 DIAGNOSIS — M1990 Unspecified osteoarthritis, unspecified site: Secondary | ICD-10-CM

## 2016-02-14 DIAGNOSIS — M19042 Primary osteoarthritis, left hand: Secondary | ICD-10-CM | POA: Insufficient documentation

## 2016-02-14 DIAGNOSIS — M25662 Stiffness of left knee, not elsewhere classified: Secondary | ICD-10-CM

## 2016-02-14 DIAGNOSIS — I70212 Atherosclerosis of native arteries of extremities with intermittent claudication, left leg: Secondary | ICD-10-CM | POA: Diagnosis not present

## 2016-02-14 DIAGNOSIS — M25531 Pain in right wrist: Secondary | ICD-10-CM | POA: Diagnosis present

## 2016-02-14 DIAGNOSIS — M67431 Ganglion, right wrist: Secondary | ICD-10-CM | POA: Insufficient documentation

## 2016-02-14 DIAGNOSIS — M25542 Pain in joints of left hand: Secondary | ICD-10-CM | POA: Diagnosis present

## 2018-01-28 ENCOUNTER — Other Ambulatory Visit: Payer: Self-pay | Admitting: Internal Medicine

## 2018-01-28 ENCOUNTER — Ambulatory Visit
Admission: RE | Admit: 2018-01-28 | Discharge: 2018-01-28 | Disposition: A | Discharge status: Home / Self Care | Payer: Disability Insurance | Source: Ambulatory Visit | Attending: Internal Medicine | Admitting: Internal Medicine

## 2018-01-28 DIAGNOSIS — M48061 Spinal stenosis, lumbar region without neurogenic claudication: Secondary | ICD-10-CM | POA: Insufficient documentation

## 2018-01-28 DIAGNOSIS — M549 Dorsalgia, unspecified: Secondary | ICD-10-CM | POA: Insufficient documentation

## 2018-01-28 DIAGNOSIS — M545 Low back pain: Secondary | ICD-10-CM

## 2018-01-28 DIAGNOSIS — M19049 Primary osteoarthritis, unspecified hand: Secondary | ICD-10-CM | POA: Diagnosis not present

## 2018-01-28 DIAGNOSIS — M1990 Unspecified osteoarthritis, unspecified site: Secondary | ICD-10-CM

## 2018-01-28 DIAGNOSIS — I700 Atherosclerosis of aorta: Secondary | ICD-10-CM | POA: Diagnosis not present

## 2018-01-28 DIAGNOSIS — M1710 Unilateral primary osteoarthritis, unspecified knee: Secondary | ICD-10-CM | POA: Insufficient documentation

## 2018-06-02 ENCOUNTER — Other Ambulatory Visit: Payer: Self-pay

## 2018-06-02 ENCOUNTER — Inpatient Hospital Stay
Admission: EM | Admit: 2018-06-02 | Discharge: 2018-06-07 | DRG: 581 | Disposition: A | Discharge status: Home / Self Care | Payer: Self-pay | Attending: Internal Medicine | Admitting: Internal Medicine

## 2018-06-02 DIAGNOSIS — L03012 Cellulitis of left finger: Secondary | ICD-10-CM

## 2018-06-02 DIAGNOSIS — F17210 Nicotine dependence, cigarettes, uncomplicated: Secondary | ICD-10-CM | POA: Diagnosis present

## 2018-06-02 DIAGNOSIS — L03114 Cellulitis of left upper limb: Secondary | ICD-10-CM | POA: Diagnosis present

## 2018-06-02 DIAGNOSIS — Z7982 Long term (current) use of aspirin: Secondary | ICD-10-CM

## 2018-06-02 DIAGNOSIS — L02512 Cutaneous abscess of left hand: Principal | ICD-10-CM | POA: Diagnosis present

## 2018-06-02 DIAGNOSIS — E876 Hypokalemia: Secondary | ICD-10-CM | POA: Diagnosis not present

## 2018-06-02 DIAGNOSIS — F1990 Other psychoactive substance use, unspecified, uncomplicated: Secondary | ICD-10-CM | POA: Diagnosis present

## 2018-06-02 LAB — COMPREHENSIVE METABOLIC PANEL
ALBUMIN: 4 g/dL (ref 3.5–5.0)
ALK PHOS: 87 U/L (ref 38–126)
ALT: 22 U/L (ref 0–44)
ANION GAP: 10 (ref 5–15)
AST: 19 U/L (ref 15–41)
BILIRUBIN TOTAL: 0.6 mg/dL (ref 0.3–1.2)
BUN: 6 mg/dL (ref 6–20)
CALCIUM: 8.8 mg/dL — AB (ref 8.9–10.3)
CO2: 24 mmol/L (ref 22–32)
Chloride: 101 mmol/L (ref 98–111)
Creatinine, Ser: 0.66 mg/dL (ref 0.61–1.24)
GFR calc Af Amer: >60 mL/min (ref >60)
GLUCOSE: 120 mg/dL — AB (ref 70–99)
Potassium: 3.5 mmol/L (ref 3.5–5.1)
Sodium: 135 mmol/L (ref 135–145)
TOTAL PROTEIN: 8.4 g/dL — AB (ref 6.5–8.1)

## 2018-06-02 LAB — URINALYSIS, COMPLETE (UACMP) WITH MICROSCOPIC
BILIRUBIN URINE: NEGATIVE
Glucose, UA: NEGATIVE mg/dL
Ketones, ur: NEGATIVE mg/dL
Leukocytes, UA: NEGATIVE
Nitrite: NEGATIVE
Protein, ur: NEGATIVE mg/dL
SPECIFIC GRAVITY, URINE: 1.016 (ref 1.005–1.030)
pH: 6 (ref 5.0–8.0)

## 2018-06-02 LAB — CBC WITH DIFFERENTIAL/PLATELET
Abs Immature Granulocytes: 0.05 10*3/uL (ref 0.00–0.07)
BASOS PCT: 0 %
Basophils Absolute: 0.1 10*3/uL (ref 0.0–0.1)
Eosinophils Absolute: 0.1 10*3/uL (ref 0.0–0.5)
Eosinophils Relative: 1 %
HEMATOCRIT: 41.7 % (ref 39.0–52.0)
Hemoglobin: 13.8 g/dL (ref 13.0–17.0)
IMMATURE GRANULOCYTES: 0 %
Lymphocytes Relative: 17 %
Lymphs Abs: 2 10*3/uL (ref 0.7–4.0)
MCH: 30.1 pg (ref 26.0–34.0)
MCHC: 33.1 g/dL (ref 30.0–36.0)
MCV: 90.8 fL (ref 80.0–100.0)
MONO ABS: 0.9 10*3/uL (ref 0.1–1.0)
MONOS PCT: 7 %
NEUTROS PCT: 75 %
Neutro Abs: 9 10*3/uL — ABNORMAL HIGH (ref 1.7–7.7)
Platelets: 262 10*3/uL (ref 150–400)
RBC: 4.59 MIL/uL (ref 4.22–5.81)
RDW: 12.2 % (ref 11.5–15.5)
WBC: 12.1 10*3/uL — AB (ref 4.0–10.5)
nRBC: 0 % (ref 0.0–0.2)

## 2018-06-02 LAB — CG4 I-STAT (LACTIC ACID): LACTIC ACID, VENOUS: 1.05 mmol/L (ref 0.5–1.9)

## 2018-06-02 MED ORDER — VANCOMYCIN HCL 10 G IV SOLR: 1.0000 g | Freq: Once | INTRAVENOUS | Status: DC

## 2018-06-02 MED ORDER — IBUPROFEN 400 MG PO TABS
400.0000 mg | ORAL_TABLET | Freq: Four times a day (QID) | ORAL | Status: DC | PRN
  Administered 2018-06-03 – 2018-06-04 (×2): 400 mg via ORAL
  Filled 2018-06-02 (×2): qty 1

## 2018-06-02 MED ORDER — ONDANSETRON HCL 4 MG PO TABS: 4.0000 mg | ORAL_TABLET | Freq: Four times a day (QID) | ORAL | Status: DC | PRN

## 2018-06-02 MED ORDER — SODIUM CHLORIDE 0.9 % IV SOLN
3.0000 g | Freq: Four times a day (QID) | INTRAVENOUS | Status: DC
  Administered 2018-06-02 – 2018-06-03 (×2): 3 g via INTRAVENOUS
  Filled 2018-06-02 (×3): qty 3

## 2018-06-02 MED ORDER — VANCOMYCIN HCL IN DEXTROSE 1-5 GM/200ML-% IV SOLN
1000.0000 mg | Freq: Two times a day (BID) | INTRAVENOUS | Status: DC
  Administered 2018-06-03 – 2018-06-05 (×5): 1000 mg via INTRAVENOUS
  Filled 2018-06-02 (×8): qty 200

## 2018-06-02 MED ORDER — VANCOMYCIN HCL IN DEXTROSE 1-5 GM/200ML-% IV SOLN
1000.0000 mg | Freq: Once | INTRAVENOUS | Status: DC
  Filled 2018-06-02: qty 200

## 2018-06-02 MED ORDER — SODIUM CHLORIDE 0.9 % IV SOLN
INTRAVENOUS | Status: DC
  Administered 2018-06-02 – 2018-06-06 (×5): via INTRAVENOUS

## 2018-06-02 MED ORDER — KETOROLAC TROMETHAMINE 15 MG/ML IJ SOLN
15.0000 mg | Freq: Four times a day (QID) | INTRAMUSCULAR | Status: DC | PRN
  Administered 2018-06-02 – 2018-06-07 (×13): 15 mg via INTRAVENOUS
  Filled 2018-06-02 (×14): qty 1

## 2018-06-02 MED ORDER — ACETAMINOPHEN 325 MG PO TABS
650.0000 mg | ORAL_TABLET | Freq: Four times a day (QID) | ORAL | Status: DC | PRN
  Administered 2018-06-03: 650 mg via ORAL
  Filled 2018-06-02: qty 2

## 2018-06-02 MED ORDER — VANCOMYCIN HCL 10 G IV SOLR
1500.0000 mg | Freq: Once | INTRAVENOUS | Status: AC
  Administered 2018-06-02: 1500 mg via INTRAVENOUS
  Filled 2018-06-02: qty 1500

## 2018-06-02 MED ORDER — METHADONE HCL 10 MG PO TABS
50.0000 mg | ORAL_TABLET | Freq: Every day | ORAL | Status: DC
  Administered 2018-06-02 – 2018-06-04 (×3): 50 mg via ORAL
  Filled 2018-06-02 (×3): qty 5

## 2018-06-02 MED ORDER — ENOXAPARIN SODIUM 40 MG/0.4ML ~~LOC~~ SOLN
40.0000 mg | SUBCUTANEOUS | Status: DC
  Administered 2018-06-02 – 2018-06-06 (×5): 40 mg via SUBCUTANEOUS
  Filled 2018-06-02 (×5): qty 0.4

## 2018-06-02 MED ORDER — ACETAMINOPHEN 650 MG RE SUPP: 650.0000 mg | Freq: Four times a day (QID) | RECTAL | Status: DC | PRN

## 2018-06-02 MED ORDER — ONDANSETRON HCL 4 MG/2ML IJ SOLN
4.0000 mg | Freq: Four times a day (QID) | INTRAMUSCULAR | Status: DC | PRN
  Administered 2018-06-05: 4 mg via INTRAVENOUS

## 2018-06-02 NOTE — ED Notes (Signed)
Report called  

## 2018-06-02 NOTE — Progress Notes (Signed)
Pharmacy Antibiotic Note  Earl Moreno is a 53 y.o. male admitted on 06/02/2018 with cellulitis.  Pharmacy has been consulted for Unasyn and vancomycin dosing.  Plan: Will start Unasyn 3 g q6H   Will give vancomycin 1500 mg loading dose (~20 mg/kg). Plan to start a maintenance dose of Vancomycin 1000 mg q12H. Predicted AUC is 411.9 Goal AUC is 400-550. Used Scr 0.8 for calculations. Plan to obtain level in around the 4th or 5th dose.   Vd 53.9 Ke 0.090 T1/2 7.7   Height: 5\' 8"  (172.7 cm) Weight: 165 lb (74.8 kg) IBW/kg (Calculated) : 68.4  Temp (24hrs), Avg:99.4 F (37.4 C), Min:99.4 F (37.4 C), Max:99.4 F (37.4 C)  Recent Labs  Lab 06/02/18 1803 06/02/18 1808  WBC 12.1*  --   CREATININE 0.66  --   LATICACIDVEN  --  1.05    Estimated Creatinine Clearance: 103.3 mL/min (by C-G formula based on SCr of 0.66 mg/dL).    No Known Allergies  Antimicrobials this admission: 1/12 vancomycin >>  1/12 Unasyn >>   Dose adjustments this admission: none  Microbiology results: None  Thank you for allowing pharmacy to be a part of this patient's care.  Ronnald Ramp, PharmD, BCPS Clinical Pharmacist 06/02/2018 7:40 PM

## 2018-06-02 NOTE — H&P (Addendum)
[facility] - [place] at [facility]   PATIENT NAME: Earl Moreno    MR#:  [id number]  DATE OF BIRTH:  [birth year]  DATE OF ADMISSION:  06/02/2018  PRIMARY CARE PHYSICIAN: [facility]   REQUESTING/REFERRING PHYSICIAN: Bayard Males MD  CHIEF COMPLAINT:   Chief Complaint  Patient presents with  . Abscess    HISTORY OF PRESENT ILLNESS: Ahan Beene  is a 53 y.o. male with no known medical history who is presenting to the emergency room with left hand swelling which started few days ago.  Patient states that he used IV heroin 7.  Subsequently few days later his symptoms started.  Patient is noted to have significance cellulitis of his left hand.  Denies any fevers or chills.    PAST MEDICAL HISTORY:  History reviewed. No pertinent past medical history.  PAST SURGICAL HISTORY: History reviewed. No pertinent surgical history.  SOCIAL HISTORY:  Social History   Tobacco Use  . Smoking status: Current Every Day Smoker    Packs/day: 0.50  . Smokeless tobacco: Never Used  Substance Use Topics  . Alcohol use: No    FAMILY HISTORY:  Family History  Family history unknown: Yes    DRUG ALLERGIES: No Known Allergies  REVIEW OF SYSTEMS:   CONSTITUTIONAL: No fever, fatigue or weakness.  EYES: No blurred or double vision.  EARS, NOSE, AND THROAT: No tinnitus or ear pain.  RESPIRATORY: No cough, shortness of breath, wheezing or hemoptysis.  CARDIOVASCULAR: No chest pain, orthopnea, edema.  GASTROINTESTINAL: No nausea, vomiting, diarrhea or abdominal pain.  GENITOURINARY: No dysuria, hematuria.  ENDOCRINE: No polyuria, nocturia,  HEMATOLOGY: No anemia, easy bruising or bleeding SKIN: Left hand swelling MUSCULOSKELETAL: No joint pain or arthritis.   NEUROLOGIC: No tingling, numbness, weakness.  PSYCHIATRY: No anxiety or depression.   MEDICATIONS AT HOME:  Prior to Admission medications   Medication Sig Start Date End Date Taking?  Authorizing Provider  methadone (DOLOPHINE) 10 MG/5ML solution Take 50 mg by mouth daily.    Yes [provider]  aspirin 81 MG chewable tablet Chew 1 tablet (81 mg total) by mouth daily. 01/07/15   Derwood Kaplan, MD  HYDROcodone-acetaminophen (NORCO) 5-325 MG per tablet Take 1 tablet by mouth every 4 (four) hours as needed for moderate pain. Patient not taking: Reported on 01/06/2015 10/29/14   Minna Antis, MD  ibuprofen (ADVIL,MOTRIN) 600 MG tablet Take 1 tablet (600 mg total) by mouth every 6 (six) hours as needed. 01/07/15   Derwood Kaplan, MD      PHYSICAL EXAMINATION:   VITAL SIGNS: Blood pressure (!) 157/80, pulse 91, temperature 99.4 F (37.4 C), temperature source Oral, resp. rate 20, height 5\' 8"  (1.727 m), weight 74.8 kg, SpO2 93 %.  GENERAL:  53 y.o.-year-old patient lying in the bed with no acute distress.  EYES: Pupils equal, round, reactive to light and accommodation. No scleral icterus. Extraocular muscles intact.  HEENT: Head atraumatic, normocephalic. Oropharynx and nasopharynx clear.  NECK:  Supple, no jugular venous distention. No thyroid enlargement, no tenderness.  LUNGS: Normal breath sounds bilaterally, no wheezing, rales,rhonchi or crepitation. No use of accessory muscles of respiration.  CARDIOVASCULAR: S1, S2 normal. No murmurs, rubs, or gallops.  ABDOMEN: Soft, nontender, nondistended. Bowel sounds present. No organomegaly or mass.  EXTREMITIES: No pedal edema, cyanosis, or clubbing.      NEUROLOGIC: Cranial nerves II through XII are intact. Muscle strength 5/5 in all extremities. Sensation intact. Gait not checked.  PSYCHIATRIC: The patient  is alert and oriented x 3.  SKIN: No obvious rash, lesion, or ulcer.   LABORATORY PANEL:   CBC Recent Labs  Lab 06/02/18 1803  WBC 12.1*  HGB 13.8  HCT 41.7  PLT 262  MCV 90.8  MCH 30.1  MCHC 33.1  RDW 12.2  LYMPHSABS 2.0  MONOABS 0.9  EOSABS 0.1  BASOSABS 0.1    ------------------------------------------------------------------------------------------------------------------  Chemistries  Recent Labs  Lab 06/02/18 1803  NA 135  K 3.5  CL 101  CO2 24  GLUCOSE 120*  BUN 6  CREATININE 0.66  CALCIUM 8.8*  AST 19  ALT 22  ALKPHOS 87  BILITOT 0.6   ------------------------------------------------------------------------------------------------------------------ estimated creatinine clearance is 103.3 mL/min (by C-G formula based on SCr of 0.66 mg/dL). ------------------------------------------------------------------------------------------------------------------ No results for input(s): TSH, T4TOTAL, T3FREE, THYROIDAB in the last 72 hours.  Invalid input(s): FREET3   Coagulation profile No results for input(s): INR, PROTIME in the last 168 hours. ------------------------------------------------------------------------------------------------------------------- No results for input(s): DDIMER in the last 72 hours. -------------------------------------------------------------------------------------------------------------------  Cardiac Enzymes No results for input(s): CKMB, TROPONINI, MYOGLOBIN in the last 168 hours.  Invalid input(s): CK ------------------------------------------------------------------------------------------------------------------ Invalid input(s): POCBNP  ---------------------------------------------------------------------------------------------------------------  Urinalysis    Component Value Date/Time   COLORURINE YELLOW (A) 06/02/2018 1803   APPEARANCEUR CLEAR (A) 06/02/2018 1803   APPEARANCEUR Cloudy 12/30/2012 1018   LABSPEC 1.016 06/02/2018 1803   LABSPEC 1.017 12/30/2012 1018   PHURINE 6.0 06/02/2018 1803   GLUCOSEU NEGATIVE 06/02/2018 1803   GLUCOSEU Negative 12/30/2012 1018   HGBUR SMALL (A) 06/02/2018 1803   BILIRUBINUR NEGATIVE 06/02/2018 1803   BILIRUBINUR Negative 12/30/2012 1018    KETONESUR NEGATIVE 06/02/2018 1803   PROTEINUR NEGATIVE 06/02/2018 1803   NITRITE NEGATIVE 06/02/2018 1803   LEUKOCYTESUR NEGATIVE 06/02/2018 1803   LEUKOCYTESUR Negative 12/30/2012 1018     RADIOLOGY: No results found.  EKG: Orders placed or performed during the hospital encounter of 01/06/15  . EKG 12-Lead  . EKG 12-Lead  . ED EKG within 10 minutes  . ED EKG within 10 minutes  . EKG    IMPRESSION AND PLAN: Patient is 53 year old with IV drug use presenting with left hand swelling and cellulitis   1.  Left hand cellulitis and IV drug use patient We will treat with IV vancomycin and Unasyn If patient does not improve by tomorrow recommend orthopedic surgery consult for possible drainage  2.  Nicotine abuse smoking cessation provided strongly recommend he stop smoking nicotine patch is offered minutes spent  3.  Patient is on a methadone program and states that he takes 50 mg which he has 3 more days to take and then is a taper.  He has provided [place] with contact information for the program I will continue methadone for tomorrow   All the records are reviewed and case discussed with ED provider. Management plans discussed with the patient, family and they are in agreement.  CODE STATUS: Full    TOTAL TIME TAKING CARE OF THIS PATIENT:55 minutes.    Auburn Bilberry M.D on 06/02/2018 at 8:42 PM  Between 7am to 6pm - Pager - [phone]  After 6pm go to [email or web address] - Social research officer, government  [employer] [facility] Office  [phone]  CC: Primary care physician; [facility]

## 2018-06-02 NOTE — ED Provider Notes (Signed)
[facility] Emergency Department Provider Note   First MD Initiated Contact with Patient 06/02/18 1905     (approximate)  I have reviewed the triage vital signs and the nursing notes.   HISTORY  Chief Complaint Abscess    HPI Earl Moreno is a 53 y.o. male presents to the emergency department with a 4-day history of left hand pain swelling and redness after injecting IV heroin and cocaine which the patient states that he missed.  Patient states he then subsequently of 10-3 more times but was unsuccessful.  Patient denies any fever afebrile on presentation.  Patient denies any chest pain no shortness of breath.  Patient denies any rash..   Past medical history IV drug abuse There are no active problems to display for this patient.    Prior to Admission medications   Medication Sig Start Date End Date Taking? Authorizing Provider  aspirin 81 MG chewable tablet Chew 1 tablet (81 mg total) by mouth daily. 01/07/15   Derwood KaplanNanavati, Ankit, MD  HYDROcodone-acetaminophen (NORCO) 5-325 MG per tablet Take 1 tablet by mouth every 4 (four) hours as needed for moderate pain. Patient not taking: Reported on 01/06/2015 10/29/14   Minna AntisPaduchowski, Kevin, MD  ibuprofen (ADVIL,MOTRIN) 600 MG tablet Take 1 tablet (600 mg total) by mouth every 6 (six) hours as needed. 01/07/15   Derwood KaplanNanavati, Ankit, MD    Allergies No known drug allergies No family history on file.  Social History Social History   Tobacco Use  . Smoking status: Current Every Day Smoker    Packs/day: 0.50  . Smokeless tobacco: Never Used  Substance Use Topics  . Alcohol use: No  . Drug use: Yes    Types: Cocaine    Comment: Heroin    Review of Systems Constitutional: No fever/chills Eyes: No visual changes. ENT: No sore throat. Cardiovascular: Denies chest pain. Respiratory: Denies shortness of breath. Gastrointestinal: No abdominal pain.  No nausea, no vomiting.  No diarrhea.  No  constipation. Genitourinary: Negative for dysuria. Musculoskeletal: Negative for neck pain.  Negative for back pain.  Positive for left hand pain swelling and redness. Integumentary: Negative for rash. Neurological: Negative for headaches, focal weakness or numbness.   ____________________________________________   PHYSICAL EXAM:  VITAL SIGNS: ED Triage Vitals  Enc Vitals Group     BP 06/02/18 1752 (!) 157/80     Pulse Rate 06/02/18 1752 91     Resp 06/02/18 1752 20     Temp 06/02/18 1752 99.4 F (37.4 C)     Temp Source 06/02/18 1752 Oral     SpO2 06/02/18 1752 93 %     Weight 06/02/18 1753 74.8 kg (165 lb)     Height 06/02/18 1753 1.727 m (5\' 8" )     Head Circumference --      Peak Flow --      Pain Score 06/02/18 1759 10     Pain Loc --      Pain Edu? --      Excl. in GC? --     Constitutional: Alert and oriented. Well appearing and in no acute distress. Eyes: Conjunctivae are normal.  Mouth/Throat: Mucous membranes are moist.  Oropharynx non-erythematous. Neck: No stridor.   Cardiovascular: Normal rate, regular rhythm. Good peripheral circulation. Grossly normal heart sounds. Respiratory: Normal respiratory effort.  No retractions. Lungs CTAB. Gastrointestinal: Soft and nontender. No distention.  Musculoskeletal: Left hand swelling, blanching erythema, painful to touch on the dorsal aspect of the hand extending beyond  the wrists Neurologic:  Normal speech and language. No gross focal neurologic deficits are appreciated.  Skin:  Skin is warm, dry and intact. No rash noted. Psychiatric: Mood and affect are normal. Speech and behavior are normal.  ____________________________________________   LABS (all labs ordered are listed, but only abnormal results are displayed)  Labs Reviewed  CBC WITH DIFFERENTIAL/PLATELET - Abnormal; Notable for the following components:      Result Value   WBC 12.1 (*)    Neutro Abs 9.0 (*)    All other components within normal limits   COMPREHENSIVE METABOLIC PANEL  URINALYSIS, COMPLETE (UACMP) WITH MICROSCOPIC  CG4 I-STAT (LACTIC ACID)  I-STAT CG4 LACTIC ACID, ED   ____________________    Procedure(s) performed:   Procedures   ____________________________________________   INITIAL IMPRESSION / ASSESSMENT AND PLAN / ED COURSE  As part of my medical decision making, I reviewed the following data within the electronic MEDICAL RECORD NUMBER   53 year old male presenting with above-stated history and physical exam insistent with cellulitis of the left hand secondary to IV drug abuse.  Given nature of illness and extent of cellulitis patient given IV vancomycin 1 g.  Patient discussed with Dr. Allena Katz for hospital admission further evaluation and management. ____________________________________________  FINAL CLINICAL IMPRESSION(S) / ED DIAGNOSES  Final diagnoses:  Cellulitis of finger of left hand     MEDICATIONS GIVEN DURING THIS VISIT:  Medications  vancomycin (VANCOCIN) injection 1 g (has no administration in time range)     ED Discharge Orders    None       Note:  This document was prepared using Dragon voice recognition software and may include unintentional dictation errors.    Darci Current, MD 06/02/18 [phone]

## 2018-06-02 NOTE — ED Triage Notes (Addendum)
Pt comes via POV from home with c/o abscess noted to left hand. Pt states this started about 4 days ago.  Pt states he went to shoot up cocaine and heroin and missed about a week ago. Pt states he attempted about 3 times. Pt states two days later he noticed the swelling, redness and tenderness.  Pt has noticeable swelling, redness and pain to left hand and up left arm. Pt states severe throbbing pain. Pt able to move all extremities.

## 2018-06-03 LAB — CBC
HCT: 40.9 % (ref 39.0–52.0)
Hemoglobin: 13.4 g/dL (ref 13.0–17.0)
MCH: 30.1 pg (ref 26.0–34.0)
MCHC: 32.8 g/dL (ref 30.0–36.0)
MCV: 91.9 fL (ref 80.0–100.0)
Platelets: 237 10*3/uL (ref 150–400)
RBC: 4.45 MIL/uL (ref 4.22–5.81)
RDW: 12.2 % (ref 11.5–15.5)
WBC: 11.3 10*3/uL — ABNORMAL HIGH (ref 4.0–10.5)
nRBC: 0 % (ref 0.0–0.2)

## 2018-06-03 LAB — BASIC METABOLIC PANEL
Anion gap: 8 (ref 5–15)
BUN: 7 mg/dL (ref 6–20)
CALCIUM: 8.5 mg/dL — AB (ref 8.9–10.3)
CO2: 26 mmol/L (ref 22–32)
Chloride: 103 mmol/L (ref 98–111)
Creatinine, Ser: 0.73 mg/dL (ref 0.61–1.24)
GFR calc Af Amer: >60 mL/min (ref >60)
GFR calc non Af Amer: >60 mL/min (ref >60)
Glucose, Bld: 89 mg/dL (ref 70–99)
Potassium: 3.3 mmol/L — ABNORMAL LOW (ref 3.5–5.1)
Sodium: 137 mmol/L (ref 135–145)

## 2018-06-03 MED ORDER — POTASSIUM CHLORIDE CRYS ER 20 MEQ PO TBCR
40.0000 meq | EXTENDED_RELEASE_TABLET | Freq: Once | ORAL | Status: AC
  Administered 2018-06-03: 40 meq via ORAL
  Filled 2018-06-03: qty 2

## 2018-06-03 MED ORDER — SODIUM CHLORIDE 0.9 % IV SOLN
3.0000 g | Freq: Four times a day (QID) | INTRAVENOUS | Status: DC
  Administered 2018-06-03 – 2018-06-07 (×17): 3 g via INTRAVENOUS
  Filled 2018-06-03 (×20): qty 3

## 2018-06-03 NOTE — Care Management (Signed)
Met with Patient and Girlfriend to discuss DC needs.  The patient has no DME needs and no Home health needs.  I provided the patient with the Medication Management application and explained the process of applying.  The patient had an appointment today for [name] and had to cancel.  They requested that the Hospital call to make follow up appointment at the time of DC.  I explained we would be happy to make the follow up appointment for them

## 2018-06-03 NOTE — Progress Notes (Signed)
[facility] - [place] at [facility]   PATIENT NAME: Earl Moreno    MR#:  [id number]  DATE OF BIRTH:  August 20, 1965  SUBJECTIVE:  CHIEF COMPLAINT:   Chief Complaint  Patient presents with  . Abscess   No new complaint this morning.  No fevers.  Patient reports significant improvement in swelling and pain involving the left hand.  REVIEW OF SYSTEMS:  Review of Systems  Constitutional: Negative for chills and fever.  HENT: Negative for hearing loss and tinnitus.   Eyes: Negative for blurred vision and double vision.  Respiratory: Negative for cough and hemoptysis.   Cardiovascular: Negative for chest pain and palpitations.  Gastrointestinal: Negative for heartburn and nausea.  Genitourinary: Negative for dysuria.  Musculoskeletal: Negative for myalgias and neck pain.  Skin: Negative for itching.       Swelling and redness of the left hand.  Neurological: Negative for dizziness and headaches.  Psychiatric/Behavioral: Negative for depression and substance abuse.    DRUG ALLERGIES:  No Known Allergies VITALS:  Blood pressure 139/82, pulse 63, temperature 98.5 F (36.9 C), temperature source Oral, resp. rate 18, height 5\' 8"  (1.727 m), weight 69.6 kg, SpO2 96 %. PHYSICAL EXAMINATION:   Physical Exam  Constitutional: He is oriented to person, place, and time. He appears well-developed and well-nourished.  HENT:  Head: Normocephalic and atraumatic.  Eyes: Pupils are equal, round, and reactive to light. Conjunctivae and EOM are normal.  Neck: Normal range of motion. Neck supple.  Cardiovascular: Normal rate and regular rhythm.  Respiratory: Effort normal and breath sounds normal.  GI: Soft. Bowel sounds are normal.  Musculoskeletal:        General: No tenderness or edema.     Comments: Swelling, redness and tenderness on the dorsum of the left hand improving.  Increasing range of motion of the digits.  Neurological: He is alert and oriented to person, place,  and time.  Skin: Skin is warm. No pallor.  Psychiatric: He has a normal mood and affect. Thought content normal.   LABORATORY PANEL:  Male CBC Recent Labs  Lab 06/03/18 0324  WBC 11.3*  HGB 13.4  HCT 40.9  PLT 237   ------------------------------------------------------------------------------------------------------------------ Chemistries  Recent Labs  Lab 06/02/18 1803 06/03/18 0324  NA 135 137  K 3.5 3.3*  CL 101 103  CO2 24 26  GLUCOSE 120* 89  BUN 6 7  CREATININE 0.66 0.73  CALCIUM 8.8* 8.5*  AST 19  --   ALT 22  --   ALKPHOS 87  --   BILITOT 0.6  --    RADIOLOGY:  No results found. ASSESSMENT AND PLAN:   Patient is 53 year old with IV drug use presenting with left hand swelling and cellulitis   1.  Left hand cellulitis and IV drug use patient Patient already started on broad-spectrum IV antibiotics with vancomycin and Unasyn. Remains afebrile.  Reports significant improvement from yesterday.  Significantly improved range of motion in the fingers.  No indication for surgical intervention at this time. Anticipate transitioning to p.o. antibiotics prior to discharge to complete treatment duration.  Monitor clinically. Counseling done to avoid IV drug abuse and patient motivated to quit.  2.  Nicotine abuse smoking cessation provided strongly recommend he stop smoking  nicotine patch is offered  3.  Patient is on a methadone program and states that he takes 50 mg which he has 3 more days to take and then is a taper. Continue methadone while admitted.  Follow-up with  methadone clinic on discharge from the hospital.  4.  Hypokalemia; replaced  All the records are reviewed and case discussed with ED provider. Management plans discussed with the patient, family and they are in agreement.  DVT prophylaxis; Lovenox    All the records are reviewed and case discussed with Care Management/Social Worker. Management plans discussed with the patient, family  and they are in agreement.  CODE STATUS: Full Code  TOTAL TIME TAKING CARE OF THIS PATIENT: 35 minutes.   More than 50% of the time was spent in counseling/coordination of care: YES  POSSIBLE D/C IN 2 DAYS, DEPENDING ON CLINICAL CONDITION.   Earl Moreno M.D on 06/03/2018 at 2:25 PM  Between 7am to 6pm - Pager - [phone]  After 6pm go to [email or web address] - Scientist, research (life [email or web address])password [email or web address]  [facility] [place] [facility]  Office  [phone]  CC: Primary care physician; [facility]  Note: This dictation was prepared with Nurse, children'sDragon dictation along with smaller phrase technology. Any transcriptional errors that result from this process are unintentional.

## 2018-06-03 NOTE — Clinical Social Work Note (Signed)
Clinical Social Work Assessment  Patient Details  Name: Earl Moreno MRN: [id number] Date of Birth: 08-27-1965  Date of referral:  06/03/18               Reason for consult:  Housing Concerns/Homelessness, Substance Use/ETOH Abuse                Permission sought to share information with:    Permission granted to share information::     Name::      RN Case Archivist::     Relationship::     Contact Information:     Housing/Transportation Living arrangements for the past 2 months:  No permanent address Source of Information:  Patient Patient Interpreter Needed:  None Criminal Activity/Legal Involvement Pertinent to Current Situation/Hospitalization:  No - Comment as needed Significant Relationships:  Parents, Siblings, Adult Children, Significant Other Lives with:  Significant Other Do you feel safe going back to the place where you live?  Yes Need for family participation in patient care:  Yes (Comment)  Care giving concerns:  Per patient he has been living on the streets in [place] with his [name] for 3.5 years now.    Social Worker assessment / plan:  Holiday representative ([place]) reviewed chart and noted that patient has a history of substance abuse. CSW met with patient and his significant other [name] was at bedside. Patient asked that [facility] stay in the room during assessment and stated that he feels comfortable talking about his drug use in front of her. Patient was alert and oriented X4 and was laying in the bed. CSW introduced self and explained role of CSW department. Per patient he has been living on the streets in [place] for 3.5 years with [name]. Per patient they sometimes stay with friends on cold or hot nights. Patient reported that he does not want to stay in the shelter because he has panic attacks when separated from [name]. Patient reported that he was using heroin and cocaine however he reported that he stopped 1 week ago when he started  the methadone clinic in [place]. Per [name] she is also going to the methadone clinic. Per patient he goes to [name] in [place] and has several friends that he sometimes stays with in [place]. Patient reported that he has applied for disability/ SSI but has not received it yet. CSW gave patient and [facility] resources including transpiration, housing, food, and outpatient substance abuse treatment options. Patient accepted resources and thanked CSW for visit. Patient reported that he gets rides from friends when needed. Patient reported no other needs or concerns. CSW will continue to follow and assist as needed.   Employment status:  Disabled (Comment on whether or not currently receiving Disability) Insurance information:  Self Pay (Medicaid Pending) PT Recommendations:  Not assessed at this time Information / Referral to community resources:  Outpatient Substance Abuse Treatment Options  Patient/Family's Response to care:  Patient appeared motivated to continue going to the methadone clinic.   Patient/Family's Understanding of and Emotional Response to Diagnosis, Current Treatment, and Prognosis:  Patient and his significant other thanked CSW for visit.   Emotional Assessment Appearance:  Appears stated age Attitude/Demeanor/Rapport:    Affect (typically observed):  Accepting, Adaptable, Pleasant Orientation:  Oriented to Self, Oriented to Place, Oriented to  Time, Oriented to Situation Alcohol / Substance use:  Illicit Drugs Psych involvement (Current and /or in the community):  No (Comment)  Discharge Needs  Concerns  to be addressed:  No discharge needs identified Readmission within the last 30 days:  No Current discharge risk:  Substance Abuse Barriers to Discharge:  Continued Medical Work up   [employer], Veronia Beets, LCSW 06/03/2018, 3:18 PM

## 2018-06-03 NOTE — Plan of Care (Signed)
Discussed with patient and family plan of care for the evening, pain management, reassured about methadone dosing for tonight and in the morning and the importance of elevation to the left arm and foam dressing applied with some teach back displayed.

## 2018-06-04 LAB — CBC
HEMATOCRIT: 36.1 % — AB (ref 39.0–52.0)
Hemoglobin: 11.6 g/dL — ABNORMAL LOW (ref 13.0–17.0)
MCH: 29.6 pg (ref 26.0–34.0)
MCHC: 32.1 g/dL (ref 30.0–36.0)
MCV: 92.1 fL (ref 80.0–100.0)
Platelets: 214 10*3/uL (ref 150–400)
RBC: 3.92 MIL/uL — ABNORMAL LOW (ref 4.22–5.81)
RDW: 12.6 % (ref 11.5–15.5)
WBC: 8 10*3/uL (ref 4.0–10.5)
nRBC: 0 % (ref 0.0–0.2)

## 2018-06-04 LAB — BASIC METABOLIC PANEL
Anion gap: 4 — ABNORMAL LOW (ref 5–15)
BUN: 9 mg/dL (ref 6–20)
CHLORIDE: 108 mmol/L (ref 98–111)
CO2: 25 mmol/L (ref 22–32)
Calcium: 8.1 mg/dL — ABNORMAL LOW (ref 8.9–10.3)
Creatinine, Ser: 0.61 mg/dL (ref 0.61–1.24)
GFR calc Af Amer: >60 mL/min (ref >60)
GFR calc non Af Amer: >60 mL/min (ref >60)
Glucose, Bld: 130 mg/dL — ABNORMAL HIGH (ref 70–99)
Potassium: 3.9 mmol/L (ref 3.5–5.1)
Sodium: 137 mmol/L (ref 135–145)

## 2018-06-04 LAB — MAGNESIUM: Magnesium: 2.3 mg/dL (ref 1.7–2.4)

## 2018-06-04 LAB — HIV ANTIBODY (ROUTINE TESTING W REFLEX): HIV Screen 4th Generation wRfx: NONREACTIVE

## 2018-06-04 MED ORDER — NICOTINE 21 MG/24HR TD PT24
21.0000 mg | MEDICATED_PATCH | Freq: Every day | TRANSDERMAL | Status: DC
  Administered 2018-06-04: 21 mg via TRANSDERMAL
  Filled 2018-06-04 (×4): qty 1

## 2018-06-04 MED ORDER — METHADONE HCL 10 MG PO TABS
60.0000 mg | ORAL_TABLET | Freq: Every day | ORAL | Status: DC
  Administered 2018-06-05 – 2018-06-07 (×3): 60 mg via ORAL
  Filled 2018-06-04 (×3): qty 6

## 2018-06-04 NOTE — Progress Notes (Signed)
Patient's wife stated his methadone treatment schedule has his dosing going up by 10 mg today or tomorrow.  She will try to bring a letter from his Child psychotherapist tomorrow afternoon

## 2018-06-04 NOTE — Progress Notes (Signed)
Pharmacy Antibiotic Note  Earl Moreno is a 53 y.o. male admitted on 06/02/2018 with cellulitis.  Pharmacy has been consulted for Unasyn and vancomycin dosing.  Plan: Will start Unasyn 3 g q6H   Will give vancomycin 1500 mg loading dose (~20 mg/kg). Plan to start a maintenance dose of Vancomycin 1000 mg q12H. Predicted AUC is 411.9 Goal AUC is 400-550. Used Scr 0.8 for calculations- remains stable. Levels ordered for tomorrow.    Vd 53.9 Ke 0.090 T1/2 7.7   Height: 5\' 8"  (172.7 cm) Weight: 153 lb 7 oz (69.6 kg) IBW/kg (Calculated) : 68.4  Temp (24hrs), Avg:98.2 F (36.8 C), Min:97.7 F (36.5 C), Max:98.9 F (37.2 C)  Recent Labs  Lab 06/02/18 1803 06/02/18 1808 06/03/18 0324 06/04/18 0651  WBC 12.1*  --  11.3* 8.0  CREATININE 0.66  --  0.73 0.61  LATICACIDVEN  --  1.05  --   --     Estimated Creatinine Clearance: 103.3 mL/min (by C-G formula based on SCr of 0.61 mg/dL).    No Known Allergies  Antimicrobials this admission: 1/12 vancomycin >>  1/12 Unasyn >>   Dose adjustments this admission: none  Microbiology results: None  Thank you for allowing pharmacy to be a part of this patient's care.  Ronnald Ramp, PharmD, BCPS Clinical Pharmacist 06/04/2018 2:35 PM

## 2018-06-04 NOTE — Progress Notes (Addendum)
Spoke to the [employer] to confirm methadone schedule. Pt is to take 50 mg for 3 days then the dose should be titrated up 10 mg every 3-5 days until the patient reaches 100 mg.    Nykeem Citro S. Allena Katz, PharmD, BCPS Clinical Pharmacist 06/04/2018

## 2018-06-04 NOTE — Progress Notes (Signed)
[employer] [facility] - [place] at [facility]   PATIENT NAME: Earl PearStacy Campanile    MR#:  [id number]  DATE OF BIRTH:  12/18/1965  SUBJECTIVE:  CHIEF COMPLAINT:   Chief Complaint  Patient presents with  . Abscess   No new complaint this morning.  No fevers.  Patient reports significant overall decrease in swelling but swelling has become more localized and opened up with purulent drainage.  Orthopedic consult placed for possible I&D.   REVIEW OF SYSTEMS:  Review of Systems  Constitutional: Negative for chills and fever.  HENT: Negative for hearing loss and tinnitus.   Eyes: Negative for blurred vision and double vision.  Respiratory: Negative for cough and hemoptysis.   Cardiovascular: Negative for chest pain and palpitations.  Gastrointestinal: Negative for heartburn and nausea.  Genitourinary: Negative for dysuria.  Musculoskeletal: Negative for myalgias and neck pain.  Skin: Negative for itching.       Swelling and redness of the left hand.  Neurological: Negative for dizziness and headaches.  Psychiatric/Behavioral: Negative for depression and substance abuse.    DRUG ALLERGIES:  No Known Allergies VITALS:  Blood pressure (!) 142/77, pulse 60, temperature 97.7 F (36.5 C), temperature source Oral, resp. rate 16, height 5\' 8"  (1.727 m), weight 69.6 kg, SpO2 97 %. PHYSICAL EXAMINATION:   Physical Exam  Constitutional: He is oriented to person, place, and time. He appears well-developed and well-nourished.  HENT:  Head: Normocephalic and atraumatic.  Eyes: Pupils are equal, round, and reactive to light. Conjunctivae and EOM are normal.  Neck: Normal range of motion. Neck supple.  Cardiovascular: Normal rate and regular rhythm.  Respiratory: Effort normal and breath sounds normal.  GI: Soft. Bowel sounds are normal.  Musculoskeletal:        General: No tenderness or edema.     Comments: Swelling, redness and tenderness on the dorsum of the left hand improving.   Increasing range of motion of the digits.  Noted purulent drainage when dressing was removed.  Neurological: He is alert and oriented to person, place, and time.  Skin: Skin is warm. No pallor.  Psychiatric: He has a normal mood and affect. Thought content normal.   LABORATORY PANEL:  Male CBC Recent Labs  Lab 06/04/18 0651  WBC 8.0  HGB 11.6*  HCT 36.1*  PLT 214   ------------------------------------------------------------------------------------------------------------------ Chemistries  Recent Labs  Lab 06/02/18 1803  06/04/18 0651  NA 135   < > 137  K 3.5   < > 3.9  CL 101   < > 108  CO2 24   < > 25  GLUCOSE 120*   < > 130*  BUN 6   < > 9  CREATININE 0.66   < > 0.61  CALCIUM 8.8*   < > 8.1*  MG  --   --  2.3  AST 19  --   --   ALT 22  --   --   ALKPHOS 87  --   --   BILITOT 0.6  --   --    < > = values in this interval not displayed.   RADIOLOGY:  No results found. ASSESSMENT AND PLAN:   Patient is 53 year old with IV drug use presenting with left hand swelling and cellulitis   1.  Left hand cellulitis with abscess and IV drug use patient Developed purulent drainage from the swelling overnight.  Overall swelling appears to be gradually improving with IV antibiotics. Patient already started on broad-spectrum IV antibiotics with vancomycin  and Unasyn. Significantly improved range of motion in the fingers.   Orthopedic physician consulted and plan is for irrigation and debridement of the left hand by Dr. Odis Luster tomorrow. Counseling done to avoid IV drug abuse and patient motivated to quit.  2.  Nicotine abuse smoking cessation provided strongly recommend he stop smoking  nicotine patch is offered  3.  Patient is on a methadone program Pharmacist confirmed dose of methadone which was continued during this admission.  Follow-up with methadone clinic on discharge from the hospital.  4.  Hypokalemia; replaced  All the records are reviewed and case discussed  with ED provider. Management plans discussed with the patient, family and they are in agreement.  DVT prophylaxis; Lovenox    All the records are reviewed and case discussed with Care Management/Social Worker. Management plans discussed with the patient, family and they are in agreement.  CODE STATUS: Full Code  TOTAL TIME TAKING CARE OF THIS PATIENT: 38 minutes.   More than 50% of the time was spent in counseling/coordination of care: YES  POSSIBLE D/C IN 2 DAYS, DEPENDING ON CLINICAL CONDITION.   Gerado Nabers M.D on 06/04/2018 at 1:23 PM  Between 7am to 6pm - Pager - [phone]  After 6pm go to [email or web address] - Scientist, research (life sciences) [facility]  Office  [phone]  CC: Primary care physician; Center, [facility]  Note: This dictation was prepared with Nurse, children's dictation along with smaller phrase technology. Any transcriptional errors that result from this process are unintentional.

## 2018-06-04 NOTE — Consult Note (Signed)
ORTHOPAEDIC CONSULTATION  REQUESTING PHYSICIAN: Jama Flavorsjie, Jude, MD  Chief Complaint: left hand pain  HPI: Earl Moreno is a 53 y.o. male who complains of left hand pain. Please see H&P and ED notes for details. Denies any numbness, tingling or constitutional symptoms.  History reviewed. No pertinent past medical history. History reviewed. No pertinent surgical history. Social History   Socioeconomic History  . Marital status: Single    Spouse name: Not on file  . Number of children: Not on file  . Years of education: Not on file  . Highest education level: Not on file  Occupational History  . Not on file  Social Needs  . Financial resource strain: Not on file  . Food insecurity:    Worry: Not on file    Inability: Not on file  . Transportation needs:    Medical: Not on file    Non-medical: Not on file  Tobacco Use  . Smoking status: Current Every Day Smoker    Packs/day: 0.50  . Smokeless tobacco: Never Used  Substance and Sexual Activity  . Alcohol use: No  . Drug use: Yes    Types: Cocaine, IV    Comment: Heroin  . Sexual activity: Not on file    Comment: occasionally  Lifestyle  . Physical activity:    Days per week: Not on file    Minutes per session: Not on file  . Stress: Not on file  Relationships  . Social connections:    Talks on phone: Not on file    Gets together: Not on file    Attends religious service: Not on file    Active member of club or organization: Not on file    Attends meetings of clubs or organizations: Not on file    Relationship status: Not on file  Other Topics Concern  . Not on file  Social History Narrative  . Not on file   Family History  Family history unknown: Yes   No Known Allergies Prior to Admission medications   Medication Sig Start Date End Date Taking? Authorizing Provider  methadone (DOLOPHINE) 10 MG/5ML solution Take 50 mg by mouth daily.    Yes [provider]  aspirin 81 MG chewable tablet Chew 1  tablet (81 mg total) by mouth daily. 01/07/15   Derwood KaplanNanavati, Ankit, MD  HYDROcodone-acetaminophen (NORCO) 5-325 MG per tablet Take 1 tablet by mouth every 4 (four) hours as needed for moderate pain. Patient not taking: Reported on 01/06/2015 10/29/14   Minna AntisPaduchowski, Kevin, MD  ibuprofen (ADVIL,MOTRIN) 600 MG tablet Take 1 tablet (600 mg total) by mouth every 6 (six) hours as needed. 01/07/15   Derwood KaplanNanavati, Ankit, MD   No results found.  Positive ROS: All other systems have been reviewed and were otherwise negative with the exception of those mentioned in the HPI and as above.  Physical Exam: General: Alert, no acute distress Cardiovascular: No pedal edema Respiratory: No cyanosis, no use of accessory musculature GI: No organomegaly, abdomen is soft and non-tender Skin: No lesions in the area of chief complaint Neurologic: Sensation intact distally Psychiatric: Patient is competent for consent with normal mood and affect Lymphatic: No axillary or cervical lymphadenopathy  MUSCULOSKELETAL: left hand with dorsal ulnar swelling, 5 mm diameter wound with thick yellow drainage, mild erythema to proximal forearm. Compartments soft. Good cap refill. Motor and sensory intact distally.  Assessment: Left hand infection  Plan: Plan irrigation and debridement of the left hand tomorrow.  The diagnosis, risks, benefits and alternatives  to treatment are all discussed in detail with the patient and family. Risks include but are not limited to bleeding, infection, deep vein thrombosis, pulmonary embolism, nerve or vascular injury, non-union, repeat operation, persistent pain, weakness, stiffness and death. He understands and is eager to proceed.     Lyndle HerrlichJames R Bowers, MD    06/04/2018 1:05 PM

## 2018-06-04 NOTE — Plan of Care (Signed)
Discussed with patient plan of care for the evening, pain management and taking a bath tonight with some teach back displayed

## 2018-06-05 ENCOUNTER — Inpatient Hospital Stay: Payer: Self-pay | Admitting: Certified Registered"

## 2018-06-05 ENCOUNTER — Encounter: Payer: Self-pay | Admitting: Orthopedic Surgery

## 2018-06-05 ENCOUNTER — Encounter
Admission: EM | Disposition: A | Discharge status: Home / Self Care | Payer: Self-pay | Source: Home / Self Care | Attending: Internal Medicine

## 2018-06-05 HISTORY — PX: I&D EXTREMITY: SHX5045

## 2018-06-05 LAB — BASIC METABOLIC PANEL
Anion gap: 4 — ABNORMAL LOW (ref 5–15)
BUN: 8 mg/dL (ref 6–20)
CO2: 27 mmol/L (ref 22–32)
Calcium: 8.3 mg/dL — ABNORMAL LOW (ref 8.9–10.3)
Chloride: 109 mmol/L (ref 98–111)
Creatinine, Ser: 0.6 mg/dL — ABNORMAL LOW (ref 0.61–1.24)
GFR calc Af Amer: >60 mL/min (ref >60)
GFR calc non Af Amer: >60 mL/min (ref >60)
Glucose, Bld: 83 mg/dL (ref 70–99)
Potassium: 3.8 mmol/L (ref 3.5–5.1)
Sodium: 140 mmol/L (ref 135–145)

## 2018-06-05 LAB — CBC
HEMATOCRIT: 36.1 % — AB (ref 39.0–52.0)
Hemoglobin: 11.7 g/dL — ABNORMAL LOW (ref 13.0–17.0)
MCH: 29.8 pg (ref 26.0–34.0)
MCHC: 32.4 g/dL (ref 30.0–36.0)
MCV: 92.1 fL (ref 80.0–100.0)
Platelets: 205 10*3/uL (ref 150–400)
RBC: 3.92 MIL/uL — ABNORMAL LOW (ref 4.22–5.81)
RDW: 12.1 % (ref 11.5–15.5)
WBC: 5.8 10*3/uL (ref 4.0–10.5)
nRBC: 0 % (ref 0.0–0.2)

## 2018-06-05 LAB — MAGNESIUM: Magnesium: 2.2 mg/dL (ref 1.7–2.4)

## 2018-06-05 LAB — VANCOMYCIN, PEAK: Vancomycin Pk: 25 ug/mL — ABNORMAL LOW (ref 30–40)

## 2018-06-05 LAB — MRSA PCR SCREENING: MRSA by PCR: NEGATIVE

## 2018-06-05 SURGERY — IRRIGATION AND DEBRIDEMENT EXTREMITY
Anesthesia: General | Site: Hand | Laterality: Left

## 2018-06-05 MED ORDER — MIDAZOLAM HCL 2 MG/2ML IJ SOLN
INTRAMUSCULAR | Status: DC | PRN
  Administered 2018-06-05: 2 mg via INTRAVENOUS

## 2018-06-05 MED ORDER — FENTANYL CITRATE (PF) 100 MCG/2ML IJ SOLN
INTRAMUSCULAR | Status: AC
  Filled 2018-06-05: qty 2

## 2018-06-05 MED ORDER — HYDROMORPHONE HCL 1 MG/ML IJ SOLN
INTRAMUSCULAR | Status: AC
  Administered 2018-06-05: 0.5 mg via INTRAVENOUS
  Filled 2018-06-05: qty 1

## 2018-06-05 MED ORDER — HYDROMORPHONE HCL 1 MG/ML IJ SOLN
0.5000 mg | INTRAMUSCULAR | Status: AC | PRN
  Administered 2018-06-05 (×4): 0.5 mg via INTRAVENOUS

## 2018-06-05 MED ORDER — ONDANSETRON HCL 4 MG/2ML IJ SOLN: 4.0000 mg | Freq: Four times a day (QID) | INTRAMUSCULAR | Status: DC | PRN

## 2018-06-05 MED ORDER — DEXAMETHASONE SODIUM PHOSPHATE 10 MG/ML IJ SOLN
INTRAMUSCULAR | Status: DC | PRN
  Administered 2018-06-05: 5 mg via INTRAVENOUS

## 2018-06-05 MED ORDER — ONDANSETRON HCL 4 MG PO TABS: 4.0000 mg | ORAL_TABLET | Freq: Four times a day (QID) | ORAL | Status: DC | PRN

## 2018-06-05 MED ORDER — FENTANYL CITRATE (PF) 100 MCG/2ML IJ SOLN
INTRAMUSCULAR | Status: AC
  Administered 2018-06-05: 25 ug via INTRAVENOUS
  Filled 2018-06-05: qty 2

## 2018-06-05 MED ORDER — LIDOCAINE HCL (PF) 2 % IJ SOLN
INTRAMUSCULAR | Status: AC
  Filled 2018-06-05: qty 10

## 2018-06-05 MED ORDER — DOCUSATE SODIUM 100 MG PO CAPS
100.0000 mg | ORAL_CAPSULE | Freq: Two times a day (BID) | ORAL | Status: DC
  Administered 2018-06-05 – 2018-06-07 (×3): 100 mg via ORAL
  Filled 2018-06-05 (×4): qty 1

## 2018-06-05 MED ORDER — PROPOFOL 10 MG/ML IV BOLUS
INTRAVENOUS | Status: AC
  Filled 2018-06-05: qty 20

## 2018-06-05 MED ORDER — LABETALOL HCL 5 MG/ML IV SOLN
INTRAVENOUS | Status: AC
  Filled 2018-06-05: qty 4

## 2018-06-05 MED ORDER — SODIUM CHLORIDE 0.9 % IR SOLN
Status: DC | PRN
  Administered 2018-06-05: 1000 mL
  Administered 2018-06-05: 07:00:00

## 2018-06-05 MED ORDER — MIDAZOLAM HCL 2 MG/2ML IJ SOLN
INTRAMUSCULAR | Status: AC
  Filled 2018-06-05: qty 2

## 2018-06-05 MED ORDER — PHENYLEPHRINE HCL 10 MG/ML IJ SOLN
INTRAMUSCULAR | Status: AC
  Filled 2018-06-05: qty 1

## 2018-06-05 MED ORDER — FENTANYL CITRATE (PF) 100 MCG/2ML IJ SOLN
25.0000 ug | INTRAMUSCULAR | Status: AC | PRN
  Administered 2018-06-05 (×6): 25 ug via INTRAVENOUS

## 2018-06-05 MED ORDER — LACTATED RINGERS IV SOLN
INTRAVENOUS | Status: DC | PRN
  Administered 2018-06-05: 08:00:00 via INTRAVENOUS

## 2018-06-05 MED ORDER — LABETALOL HCL 5 MG/ML IV SOLN
10.0000 mg | Freq: Once | INTRAVENOUS | Status: AC
  Administered 2018-06-05: 10 mg via INTRAVENOUS

## 2018-06-05 MED ORDER — ONDANSETRON HCL 4 MG/2ML IJ SOLN
INTRAMUSCULAR | Status: AC
  Filled 2018-06-05: qty 2

## 2018-06-05 MED ORDER — ONDANSETRON HCL 4 MG/2ML IJ SOLN: 4.0000 mg | Freq: Once | INTRAMUSCULAR | Status: DC | PRN

## 2018-06-05 MED ORDER — LIDOCAINE HCL (CARDIAC) PF 100 MG/5ML IV SOSY
PREFILLED_SYRINGE | INTRAVENOUS | Status: DC | PRN
  Administered 2018-06-05: 80 mg via INTRAVENOUS

## 2018-06-05 MED ORDER — FENTANYL CITRATE (PF) 100 MCG/2ML IJ SOLN
INTRAMUSCULAR | Status: DC | PRN
  Administered 2018-06-05 (×2): 50 ug via INTRAVENOUS

## 2018-06-05 MED ORDER — METOCLOPRAMIDE HCL 5 MG/ML IJ SOLN: 5.0000 mg | Freq: Three times a day (TID) | INTRAMUSCULAR | Status: DC | PRN

## 2018-06-05 MED ORDER — DEXAMETHASONE SODIUM PHOSPHATE 10 MG/ML IJ SOLN
INTRAMUSCULAR | Status: AC
  Filled 2018-06-05: qty 1

## 2018-06-05 MED ORDER — PROPOFOL 10 MG/ML IV BOLUS
INTRAVENOUS | Status: DC | PRN
  Administered 2018-06-05: 150 mg via INTRAVENOUS

## 2018-06-05 MED ORDER — HYDRALAZINE HCL 20 MG/ML IJ SOLN
10.0000 mg | Freq: Once | INTRAMUSCULAR | Status: AC
  Administered 2018-06-05: 10 mg via INTRAVENOUS

## 2018-06-05 MED ORDER — METOCLOPRAMIDE HCL 10 MG PO TABS: 5.0000 mg | ORAL_TABLET | Freq: Three times a day (TID) | ORAL | Status: DC | PRN

## 2018-06-05 MED ORDER — HYDRALAZINE HCL 20 MG/ML IJ SOLN
INTRAMUSCULAR | Status: AC
  Filled 2018-06-05: qty 1

## 2018-06-05 SURGICAL SUPPLY — 46 items
BNDG COHESIVE 4X5 TAN STRL (GAUZE/BANDAGES/DRESSINGS) ×2 IMPLANT
BNDG GAUZE 4.5X4.1 6PLY STRL (MISCELLANEOUS) ×2 IMPLANT
BRUSH SCRUB EZ  4% CHG (MISCELLANEOUS) ×2
BRUSH SCRUB EZ 4% CHG (MISCELLANEOUS) ×2 IMPLANT
CANISTER SUCT 1200ML W/VALVE (MISCELLANEOUS) ×2 IMPLANT
CANISTER SUCT 3000ML PPV (MISCELLANEOUS) ×2 IMPLANT
CAST PADDING 6X4YD ST 30248 (SOFTGOODS) ×1
CHLORAPREP W/TINT 26ML (MISCELLANEOUS) ×4 IMPLANT
COVER WAND RF STERILE (DRAPES) ×2 IMPLANT
CUFF TOURN 18 STER (MISCELLANEOUS) ×1 IMPLANT
DRAIN PENROSE 1/4X12 LTX (DRAIN) ×1 IMPLANT
DRAPE INCISE IOBAN 66X60 STRL (DRAPES) ×2 IMPLANT
DRAPE SHEET LG 3/4 BI-LAMINATE (DRAPES) IMPLANT
DRAPE SURG 17X11 SM STRL (DRAPES) IMPLANT
DRAPE U-SHAPE 47X51 STRL (DRAPES) IMPLANT
ELECT REM PT RETURN 9FT ADLT (ELECTROSURGICAL) ×2
ELECTRODE REM PT RTRN 9FT ADLT (ELECTROSURGICAL) ×1 IMPLANT
GAUZE SPONGE 4X4 12PLY STRL (GAUZE/BANDAGES/DRESSINGS) ×1 IMPLANT
GAUZE XEROFORM 4X4 STRL (GAUZE/BANDAGES/DRESSINGS) ×1 IMPLANT
GLOVE INDICATOR 8.0 STRL GRN (GLOVE) ×2 IMPLANT
GLOVE SURG ORTHO 8.0 STRL STRW (GLOVE) ×2 IMPLANT
GOWN STRL REUS W/ TWL LRG LVL3 (GOWN DISPOSABLE) ×1 IMPLANT
GOWN STRL REUS W/ TWL XL LVL3 (GOWN DISPOSABLE) ×1 IMPLANT
GOWN STRL REUS W/TWL LRG LVL3 (GOWN DISPOSABLE) ×1
GOWN STRL REUS W/TWL XL LVL3 (GOWN DISPOSABLE) ×1
IV SOD CHL 0.9% 1000ML (IV SOLUTION) ×2 IMPLANT
KIT TURNOVER KIT A (KITS) ×2 IMPLANT
NS IRRIG 1000ML POUR BTL (IV SOLUTION) ×2 IMPLANT
PACK EXTREMITY ARMC (MISCELLANEOUS) ×2 IMPLANT
PAD ABD DERMACEA PRESS 5X9 (GAUZE/BANDAGES/DRESSINGS) ×1 IMPLANT
PAD CAST CTTN 4X4 STRL (SOFTGOODS) IMPLANT
PADDING CAST COTTON 4X4 STRL (SOFTGOODS) ×2
PADDING CAST COTTON 6X4 ST (SOFTGOODS) IMPLANT
PULSAVAC PLUS IRRIG FAN TIP (DISPOSABLE) ×2
SPONGE LAP 18X18 RF (DISPOSABLE) ×1 IMPLANT
STAPLER SKIN PROX 35W (STAPLE) ×2 IMPLANT
STOCKINETTE IMPERVIOUS 9X36 MD (GAUZE/BANDAGES/DRESSINGS) ×2 IMPLANT
SUT ETHILON 2 0 FSLX (SUTURE) IMPLANT
SUT ETHILON NAB PS2 4-0 18IN (SUTURE) IMPLANT
SUT PDS AB 2-0 CT1 27 (SUTURE) IMPLANT
SUT VIC AB 1 CT1 36 (SUTURE) IMPLANT
SUT VIC AB 2-0 CT1 27 (SUTURE)
SUT VIC AB 2-0 CT1 TAPERPNT 27 (SUTURE) IMPLANT
SUT VICRYL AB 3-0 FS1 BRD 27IN (SUTURE) IMPLANT
TIP FAN IRRIG PULSAVAC PLUS (DISPOSABLE) IMPLANT
TOWEL OR 17X26 4PK STRL BLUE (TOWEL DISPOSABLE) ×2 IMPLANT

## 2018-06-05 NOTE — Transfer of Care (Signed)
Immediate Anesthesia Transfer of Care Note  Patient: Earl Moreno  Procedure(s) Performed: IRRIGATION AND DEBRIDEMENT EXTREMITY-LEFT HAND (Left Hand)  Patient Location: PACU  Anesthesia Type:General  Level of Consciousness: awake, alert  and oriented  Airway & Oxygen Therapy: Patient Spontanous Breathing and Patient connected to face mask oxygen  Post-op Assessment: Report given to RN and Post -op Vital signs reviewed and stable  Post vital signs: Reviewed and stable  Last Vitals:  Vitals Value Taken Time  BP    Temp    Pulse    Resp    SpO2      Last Pain:  Vitals:   06/04/18 2343  TempSrc:   PainSc: Asleep      Patients Stated Pain Goal: 2 (06/03/18 2202)  Complications: No apparent anesthesia complications

## 2018-06-05 NOTE — Anesthesia Post-op Follow-up Note (Signed)
Anesthesia QCDR form completed.        

## 2018-06-05 NOTE — Anesthesia Procedure Notes (Signed)
Procedure Name: LMA Insertion Date/Time: 06/05/2018 7:45 AM Performed by: Clyde Lundborg, CRNA Pre-anesthesia Checklist: Patient identified, Emergency Drugs available, Suction available and Patient being monitored Patient Re-evaluated:Patient Re-evaluated prior to induction Oxygen Delivery Method: Circle system utilized Preoxygenation: Pre-oxygenation with 100% oxygen Induction Type: IV induction LMA: LMA inserted LMA Size: 4.0 Number of attempts: 1 Tube secured with: Tape Dental Injury: Teeth and Oropharynx as per pre-operative assessment

## 2018-06-05 NOTE — Progress Notes (Signed)
[employer] - [place] at [facility]   PATIENT NAME: Earl Moreno    MR#:  [id number]  DATE OF BIRTH:  04/02/1966  SUBJECTIVE:  CHIEF COMPLAINT:   Chief Complaint  Patient presents with  . Abscess   No new complaint this morning.  No fevers.   Patient was taken to the OR irrigation and debridement by orthopedic physician today.  REVIEW OF SYSTEMS:  Review of Systems  Constitutional: Negative for chills and fever.  HENT: Negative for hearing loss and tinnitus.   Eyes: Negative for blurred vision and double vision.  Respiratory: Negative for cough and hemoptysis.   Cardiovascular: Negative for chest pain and palpitations.  Gastrointestinal: Negative for heartburn and nausea.  Genitourinary: Negative for dysuria.  Musculoskeletal: Negative for myalgias and neck pain.  Skin: Negative for itching.  Neurological: Negative for dizziness and headaches.  Psychiatric/Behavioral: Negative for depression and substance abuse.    DRUG ALLERGIES:  No Known Allergies VITALS:  Blood pressure (!) 153/83, pulse 69, temperature 98.3 F (36.8 C), temperature source Oral, resp. rate 13, height 5\' 8"  (1.727 m), weight 69.6 kg, SpO2 95 %. PHYSICAL EXAMINATION:   Physical Exam  Constitutional: He is oriented to person, place, and time. He appears well-developed and well-nourished.  HENT:  Head: Normocephalic and atraumatic.  Eyes: Pupils are equal, round, and reactive to light. Conjunctivae and EOM are normal.  Neck: Normal range of motion. Neck supple.  Cardiovascular: Normal rate and regular rhythm.  Respiratory: Effort normal and breath sounds normal.  GI: Soft. Bowel sounds are normal.  Musculoskeletal:        General: No tenderness or edema.     Comments: Dressing in place to left hand status post irrigation and debridement  Neurological: He is alert and oriented to person, place, and time.  Skin: Skin is warm. No pallor.  Psychiatric: He has a normal mood and  affect. Thought content normal.   LABORATORY PANEL:  Male CBC Recent Labs  Lab 06/05/18 0404  WBC 5.8  HGB 11.7*  HCT 36.1*  PLT 205   ------------------------------------------------------------------------------------------------------------------ Chemistries  Recent Labs  Lab 06/02/18 1803  06/05/18 0404  NA 135   < > 140  K 3.5   < > 3.8  CL 101   < > 109  CO2 24   < > 27  GLUCOSE 120*   < > 83  BUN 6   < > 8  CREATININE 0.66   < > 0.60*  CALCIUM 8.8*   < > 8.3*  MG  --    < > 2.2  AST 19  --   --   ALT 22  --   --   ALKPHOS 87  --   --   BILITOT 0.6  --   --    < > = values in this interval not displayed.   RADIOLOGY:  No results found. ASSESSMENT AND PLAN:   Patient is 53 year old with IV drug use presenting with left hand swelling and cellulitis   1.  Left hand cellulitis with abscess and IV drug use patient Developed purulent drainage from the swelling recently Seen by orthopedic physician Dr. Odis LusterBowers and was taken to the OR for irrigation and debridement this morning.  Patient already started on broad-spectrum IV antibiotics with vancomycin and Unasyn. Significantly improved range of motion in the fingers.   Counseling done to avoid IV drug abuse and patient motivated to quit.  HIV test negative.  2.  Nicotine abuse smoking  cessation provided strongly recommend he stop smoking  nicotine patch is offered  3.  Patient is on a methadone program Pharmacist confirmed dose of methadone which was continued during this admission.  Follow-up with methadone clinic on discharge from the hospital.  4.  Hypokalemia; replaced  All the records are reviewed and case discussed with ED provider. Management plans discussed with the patient, family and they are in agreement.  DVT prophylaxis; Lovenox    All the records are reviewed and case discussed with Care Management/Social Worker. Management plans discussed with the patient, family and they are in  agreement.  CODE STATUS: Full Code  TOTAL TIME TAKING CARE OF THIS PATIENT: 35 minutes.   More than 50% of the time was spent in counseling/coordination of care: YES  POSSIBLE D/C IN 1-2 DAYS, DEPENDING ON CLINICAL CONDITION.   Earl Moreno M.D on 06/05/2018 at 1:35 PM  Between 7am to 6pm - Pager - [phone]  After 6pm go to [email or web address] - Scientist, research (life sciences) [facility]  Office  [phone]  CC: Primary care physician; Center, [facility]  Note: This dictation was prepared with Nurse, children's dictation along with smaller phrase technology. Any transcriptional errors that result from this process are unintentional.

## 2018-06-05 NOTE — Anesthesia Preprocedure Evaluation (Addendum)
Anesthesia Evaluation  Patient identified by MRN, date of birth, ID band Patient awake    Reviewed: Allergy & Precautions, H&P , NPO status , Patient's Chart, lab work & pertinent test results, reviewed documented beta blocker date and time   Airway Mallampati: II  TM Distance: >3 FB Neck ROM: full    Dental  (+) Teeth Intact   Pulmonary neg pulmonary ROS, Current Smoker,    Pulmonary exam normal        Cardiovascular Exercise Tolerance: Good negative cardio ROS Normal cardiovascular exam Rate:Normal     Neuro/Psych negative neurological ROS  negative psych ROS   GI/Hepatic negative GI ROS, Neg liver ROS,   Endo/Other  negative endocrine ROS  Renal/GU negative Renal ROS  negative genitourinary   Musculoskeletal   Abdominal   Peds  Hematology negative hematology ROS (+)   Anesthesia Other Findings   Reproductive/Obstetrics negative OB ROS                             Anesthesia Physical Anesthesia Plan  ASA: II and emergent  Anesthesia Plan: General LMA   Post-op Pain Management:    Induction:   PONV Risk Score and Plan:   Airway Management Planned:   Additional Equipment:   Intra-op Plan:   Post-operative Plan:   Informed Consent: I have reviewed the patients History and Physical, chart, labs and discussed the procedure including the risks, benefits and alternatives for the proposed anesthesia with the patient or authorized representative who has indicated his/her understanding and acceptance.       Plan Discussed with: CRNA  Anesthesia Plan Comments:         Anesthesia Quick Evaluation  

## 2018-06-05 NOTE — Op Note (Addendum)
06/05/2018  9:01 AM  PATIENT:  Earl Moreno    PRE-OPERATIVE DIAGNOSIS:  LEFT HAND INFECTION  POST-OPERATIVE DIAGNOSIS:  Same  PROCEDURE:  IRRIGATION AND DEBRIDEMENT EXTREMITY-LEFT HAND  SURGEON:  Lyndle Herrlich, MD  ASSIST: Altamese Cabal, PA-C  ANESTHESIA:   General  PREOPERATIVE INDICATIONS:  [name] is a  53 y.o. male with a diagnosis of LEFT HAND INFECTION who presents for urgent irrigation and debridement secondary to aggressive deep infection.  The risks benefits and alternatives were discussed with the patient preoperatively including but not limited to the risks of infection, bleeding, nerve injury, cardiopulmonary complications, the need for revision surgery, among others, and the patient was willing to proceed.  EBL: less than 25 mL   TOURNIQUET TIME: 13 MIN  OPERATIVE FINDINGS: Warmth, erythema, swelling and purulent drainage from dorsal/ulnar aspect of the left hand  OPERATIVE PROCEDURE:   After informed consent was obtained and the appropriate extremity marked in the pre-operative holding area, the patient was taken to the operating room and placed in the supine position. General anesthesia was induced and the upper extremity was prepped and draped in standard sterile fashion. The extremity was elevated and the tourniquet insufflated to 250 mmHg. An incision was made proximal and distal from infected area. Sharp dissection with the scalpel and iris scissor was utilized to debride the skin edges. Necrotic subcutaneous and fascial tissue was sharply debrided with the iris scissors and currettes. Care was taken to protect the tendinous and nervous structures. Cultures were obtained. The wound was irrigated with more than 3 liters of bacitracin laced normal saline. A penrose drain was left beneath the retinaculum and brought out the proximal portion of the incision. The skin was closed loosely with 4-0 nylon. A sterile dressing was applied followed by a volar resting  splint.   Cassell Smiles, MD

## 2018-06-05 NOTE — Anesthesia Postprocedure Evaluation (Signed)
Anesthesia Post Note  Patient: Earl Moreno  Procedure(s) Performed: IRRIGATION AND DEBRIDEMENT EXTREMITY-LEFT HAND (Left Hand)  Patient location during evaluation: PACU Anesthesia Type: General Level of consciousness: awake and alert Pain management: pain level controlled Vital Signs Assessment: post-procedure vital signs reviewed and stable Respiratory status: spontaneous breathing, nonlabored ventilation, respiratory function stable and patient connected to nasal cannula oxygen Cardiovascular status: blood pressure returned to baseline and stable Postop Assessment: no apparent nausea or vomiting Anesthetic complications: no     Last Vitals:  Vitals:   06/05/18 1211 06/05/18 1300  BP: (!) 152/88 (!) 153/83  Pulse: 75 69  Resp:    Temp: 36.7 C 36.8 C  SpO2: 96% 95%    Last Pain:  Vitals:   06/05/18 1305  TempSrc:   PainSc: 7                  Yevette EdwardsJames G Adams

## 2018-06-05 NOTE — Plan of Care (Signed)
  Problem: Clinical Measurements: Goal: Ability to avoid or minimize complications of infection will improve Outcome: Progressing   Problem: Skin Integrity: Goal: Skin integrity will improve Outcome: Progressing   Problem: Education: Goal: Knowledge of General Education information will improve Description Including pain rating scale, medication(s)/side effects and non-pharmacologic comfort measures Outcome: Progressing   Problem: Activity: Goal: Risk for activity intolerance will decrease Outcome: Progressing   Problem: Pain Managment: Goal: General experience of comfort will improve Outcome: Progressing   Problem: Safety: Goal: Ability to remain free from injury will improve Outcome: Progressing   Problem: Education: Goal: Required Educational Video(s) Outcome: Progressing   Problem: Clinical Measurements: Goal: Postoperative complications will be avoided or minimized Outcome: Progressing   Problem: Skin Integrity: Goal: Demonstration of wound healing without infection will improve Outcome: Progressing

## 2018-06-06 LAB — CBC
HCT: 34.8 % — ABNORMAL LOW (ref 39.0–52.0)
Hemoglobin: 11.5 g/dL — ABNORMAL LOW (ref 13.0–17.0)
MCH: 30.3 pg (ref 26.0–34.0)
MCHC: 33 g/dL (ref 30.0–36.0)
MCV: 91.6 fL (ref 80.0–100.0)
PLATELETS: 242 10*3/uL (ref 150–400)
RBC: 3.8 MIL/uL — ABNORMAL LOW (ref 4.22–5.81)
RDW: 11.9 % (ref 11.5–15.5)
WBC: 12 10*3/uL — ABNORMAL HIGH (ref 4.0–10.5)
nRBC: 0 % (ref 0.0–0.2)

## 2018-06-06 LAB — BASIC METABOLIC PANEL
Anion gap: 7 (ref 5–15)
BUN: 9 mg/dL (ref 6–20)
CALCIUM: 8.3 mg/dL — AB (ref 8.9–10.3)
CO2: 24 mmol/L (ref 22–32)
Chloride: 106 mmol/L (ref 98–111)
Creatinine, Ser: 0.67 mg/dL (ref 0.61–1.24)
GFR calc Af Amer: >60 mL/min (ref >60)
Glucose, Bld: 139 mg/dL — ABNORMAL HIGH (ref 70–99)
Potassium: 4 mmol/L (ref 3.5–5.1)
Sodium: 137 mmol/L (ref 135–145)

## 2018-06-06 LAB — VANCOMYCIN, TROUGH: Vancomycin Tr: 8 ug/mL — ABNORMAL LOW (ref 15–20)

## 2018-06-06 LAB — MAGNESIUM: MAGNESIUM: 2 mg/dL (ref 1.7–2.4)

## 2018-06-06 MED ORDER — VANCOMYCIN HCL IN DEXTROSE 1-5 GM/200ML-% IV SOLN
1000.0000 mg | Freq: Three times a day (TID) | INTRAVENOUS | Status: DC
  Filled 2018-06-06: qty 200

## 2018-06-06 MED ORDER — VANCOMYCIN HCL 10 G IV SOLR
1250.0000 mg | Freq: Two times a day (BID) | INTRAVENOUS | Status: DC
  Administered 2018-06-06 – 2018-06-07 (×3): 1250 mg via INTRAVENOUS
  Filled 2018-06-06 (×5): qty 1250

## 2018-06-06 NOTE — Progress Notes (Signed)
Pharmacy Antibiotic Note  Earl Moreno is a 53 y.o. male admitted on 06/02/2018 with cellulitis.  Pharmacy has been consulted for Unasyn and vancomycin dosing.  Plan: Continue Unasyn 3 g q6H (06/06/2018 is day 4)  Vancomycin (06/06/2018 is day 4) levels returned subtherapeutic (AUC estimate 321 based on steady state pk, 382 if first dose pk used ). Patient missed the morning dose yesterday due to transfer to procedural area. Since levels are low based on both steady state and first dose kinetics we will modestly increase to vancomycin 1.25 gm IV Q12H.   Goal AUC 400-550. Expected AUC: 480 SCr used: N/A - used two level modeling    Height: 5\' 8"  (172.7 cm) Weight: 153 lb 7 oz (69.6 kg) IBW/kg (Calculated) : 68.4  Temp (24hrs), Avg:97.9 F (36.6 C), Min:97 F (36.1 C), Max:98.5 F (36.9 C)  Recent Labs  Lab 06/02/18 1803 06/02/18 1808 06/03/18 0324 06/04/18 0651 06/05/18 0404 06/05/18 2307 06/06/18 0635  WBC 12.1*  --  11.3* 8.0 5.8  --  12.0*  CREATININE 0.66  --  0.73 0.61 0.60*  --  0.67  LATICACIDVEN  --  1.05  --   --   --   --   --   VANCOTROUGH  --   --   --   --   --   --  8*  VANCOPEAK  --   --   --   --   --  25*  --     Estimated Creatinine Clearance: 103.3 mL/min (by C-G formula based on SCr of 0.67 mg/dL).    No Known Allergies  Antimicrobials this admission: 1/12 vancomycin >>  1/12 Unasyn >>   Dose adjustments this admission: none  Microbiology results: None  Thank you for allowing pharmacy to be a part of this patient's care.  Carola Frost, PharmD, BCPS Clinical Pharmacist 06/06/2018 7:28 AM

## 2018-06-06 NOTE — Progress Notes (Signed)
[facility] - [place] at [facility]   PATIENT NAME: Earl PearStacy Goodbar    MR#:  [id number]  DATE OF BIRTH:  05/13/1966  SUBJECTIVE:  CHIEF COMPLAINT:   Chief Complaint  Patient presents with  . Abscess   No new complaint this morning.  No fevers. Patient day 1 status post irrigation and debridement of the left hand by orthopedic physician.  Moving the digits move freely.  REVIEW OF SYSTEMS:  Review of Systems  Constitutional: Negative for chills and fever.  HENT: Negative for hearing loss and tinnitus.   Eyes: Negative for blurred vision and double vision.  Respiratory: Negative for cough and hemoptysis.   Cardiovascular: Negative for chest pain and palpitations.  Gastrointestinal: Negative for heartburn and nausea.  Genitourinary: Negative for dysuria.  Musculoskeletal: Negative for myalgias and neck pain.  Skin: Negative for itching.  Neurological: Negative for dizziness and headaches.  Psychiatric/Behavioral: Negative for depression and substance abuse.    DRUG ALLERGIES:  No Known Allergies VITALS:  Blood pressure (!) 150/68, pulse (!) 52, temperature 97.7 F (36.5 C), temperature source Oral, resp. rate 18, height 5\' 8"  (1.727 m), weight 69.6 kg, SpO2 97 %. PHYSICAL EXAMINATION:   Physical Exam  Constitutional: He is oriented to person, place, and time. He appears well-developed and well-nourished.  HENT:  Head: Normocephalic and atraumatic.  Eyes: Pupils are equal, round, and reactive to light. Conjunctivae and EOM are normal.  Neck: Normal range of motion. Neck supple.  Cardiovascular: Normal rate and regular rhythm.  Respiratory: Effort normal and breath sounds normal.  GI: Soft. Bowel sounds are normal.  Musculoskeletal:        General: No tenderness or edema.     Comments: Dressing in place to left hand status post irrigation and debridement  Neurological: He is alert and oriented to person, place, and time.  Skin: Skin is warm. No pallor.    Psychiatric: He has a normal mood and affect. Thought content normal.   LABORATORY PANEL:  Male CBC Recent Labs  Lab 06/06/18 0635  WBC 12.0*  HGB 11.5*  HCT 34.8*  PLT 242   ------------------------------------------------------------------------------------------------------------------ Chemistries  Recent Labs  Lab 06/02/18 1803  06/06/18 0635  NA 135   < > 137  K 3.5   < > 4.0  CL 101   < > 106  CO2 24   < > 24  GLUCOSE 120*   < > 139*  BUN 6   < > 9  CREATININE 0.66   < > 0.67  CALCIUM 8.8*   < > 8.3*  MG  --    < > 2.0  AST 19  --   --   ALT 22  --   --   ALKPHOS 87  --   --   BILITOT 0.6  --   --    < > = values in this interval not displayed.   RADIOLOGY:  No results found. ASSESSMENT AND PLAN:   Patient is 53 year old with IV drug use presenting with left hand swelling and cellulitis   1.  Left hand cellulitis with abscess and IV drug use patient Developed purulent drainage from the swelling recently Seen by orthopedic physician Dr. Odis LusterBowers and now day 1 status post irrigation and debridement by tabetic physician  Noted elevation in white blood cell count this morning.  Patient however remains afebrile.  May be reactive from recent surgery.   Continue current broad-spectrum IV antibiotics with vancomycin and Unasyn and follow-up on  CBC in a.m. before de-escalation of antibiotics pending culture results as well  Significantly improved range of motion in the fingers.   Counseling done to avoid IV drug abuse and patient motivated to quit.  HIV test negative.  2.  Nicotine abuse smoking cessation provided strongly recommend he stop smoking  nicotine patch is offered  3.  Patient is on a methadone program Pharmacist confirmed dose of methadone which was continued during this admission.  Follow-up with methadone clinic on discharge from the hospital.  4.  Hypokalemia; replaced  DVT prophylaxis; Lovenox  Disposition; possible discharge tomorrow if okay  with orthopedic physician   All the records are reviewed and case discussed with Care Management/Social Worker. Management plans discussed with the patient, family and they are in agreement.  CODE STATUS: Full Code  TOTAL TIME TAKING CARE OF THIS PATIENT: 33 minutes.   More than 50% of the time was spent in counseling/coordination of care: YES  POSSIBLE D/C IN  AM, DEPENDING ON CLINICAL CONDITION.   Shawntel Farnworth M.D on 06/06/2018 at 1:23 PM  Between 7am to 6pm - Pager - [phone]  After 6pm go to [email or web address] - Scientist, research (life sciences) [facility]  Office  [phone]  CC: Primary care physician; Center[facility]  Note: This dictation was prepared with Nurse, children's dictation along with smaller phrase technology. Any transcriptional errors that result from this process are unintentional.

## 2018-06-07 LAB — BASIC METABOLIC PANEL
Anion gap: 7 (ref 5–15)
BUN: 9 mg/dL (ref 6–20)
CO2: 27 mmol/L (ref 22–32)
Calcium: 8.3 mg/dL — ABNORMAL LOW (ref 8.9–10.3)
Chloride: 108 mmol/L (ref 98–111)
Creatinine, Ser: 0.66 mg/dL (ref 0.61–1.24)
GFR calc Af Amer: >60 mL/min (ref >60)
GFR calc non Af Amer: >60 mL/min (ref >60)
Glucose, Bld: 104 mg/dL — ABNORMAL HIGH (ref 70–99)
Potassium: 3.5 mmol/L (ref 3.5–5.1)
Sodium: 142 mmol/L (ref 135–145)

## 2018-06-07 LAB — CBC
HCT: 36.3 % — ABNORMAL LOW (ref 39.0–52.0)
Hemoglobin: 11.5 g/dL — ABNORMAL LOW (ref 13.0–17.0)
MCH: 29.6 pg (ref 26.0–34.0)
MCHC: 31.7 g/dL (ref 30.0–36.0)
MCV: 93.6 fL (ref 80.0–100.0)
Platelets: 231 10*3/uL (ref 150–400)
RBC: 3.88 MIL/uL — ABNORMAL LOW (ref 4.22–5.81)
RDW: 12.1 % (ref 11.5–15.5)
WBC: 8.3 10*3/uL (ref 4.0–10.5)
nRBC: 0 % (ref 0.0–0.2)

## 2018-06-07 MED ORDER — DOXYCYCLINE HYCLATE 100 MG PO TABS
100.0000 mg | ORAL_TABLET | Freq: Two times a day (BID) | ORAL | Status: DC
  Administered 2018-06-07: 100 mg via ORAL
  Filled 2018-06-07: qty 1

## 2018-06-07 MED ORDER — AMOXICILLIN-POT CLAVULANATE 875-125 MG PO TABS: 1.0000 | ORAL_TABLET | Freq: Two times a day (BID) | ORAL | 0 refills | Status: DC

## 2018-06-07 MED ORDER — DOXYCYCLINE HYCLATE 100 MG PO TABS: 100.0000 mg | ORAL_TABLET | Freq: Two times a day (BID) | ORAL | 0 refills | Status: AC

## 2018-06-07 MED ORDER — IBUPROFEN 600 MG PO TABS: 600.0000 mg | ORAL_TABLET | Freq: Four times a day (QID) | ORAL | 0 refills | Status: DC | PRN

## 2018-06-07 MED ORDER — DOXYCYCLINE HYCLATE 100 MG PO TABS: 100.0000 mg | ORAL_TABLET | Freq: Two times a day (BID) | ORAL | Status: DC

## 2018-06-07 MED ORDER — DOXYCYCLINE HYCLATE 100 MG PO TABS: 100.0000 mg | ORAL_TABLET | Freq: Two times a day (BID) | ORAL | 0 refills | Status: DC

## 2018-06-07 MED ORDER — AMOXICILLIN-POT CLAVULANATE 875-125 MG PO TABS: 1.0000 | ORAL_TABLET | Freq: Two times a day (BID) | ORAL | 0 refills | Status: AC

## 2018-06-07 NOTE — Care Management (Signed)
Patient states that as long as he does not have to do any dressing changes he will be able to care for the wound.  He will follow up with Physician Monday

## 2018-06-07 NOTE — Care Management (Signed)
Called medication Mgt. At [phone] [place] [facility] stated that the patient does not need to fill out anything for a 1 time fill, they will be able to help with Medications No cost to the patient.  Med Mgt closes at 430, closed 1230- 130 for lunch, notified Patient

## 2018-06-07 NOTE — Care Management (Signed)
Dr. Allena Katz stated "no need to change dressing and no need to remove the current dressing before the patient is seen in his office on Monday, no need to Delay Discharge" I will notify the patient

## 2018-06-07 NOTE — Progress Notes (Addendum)
Pharmacy Antibiotic Note  Earl Moreno is a 53 y.o. male admitted on 06/02/2018 with cellulitis/abscess of hand.  Pharmacy has been consulted for Unasyn and vancomycin dosing. Hx IV drug use  Plan:  Unasyn 3 g q6H (06/07/2018 is day 5)  Vancomycin (06/07/2018 is day 5) levels from 06/06/2018 returned subtherapeutic (AUC estimate 321 based on steady state pk, 382 if first dose pk used ). Patient missed the morning dose yesterday due to transfer to procedural area. Since levels are low based on both steady state and first dose kinetics we will modestly increase to vancomycin 1.25 gm IV Q12H.   Goal AUC 400-550.  Expected AUC: 480 SCr used: N/A - used two level modeling    Height: 5\' 8"  (172.7 cm) Weight: 153 lb 7 oz (69.6 kg) IBW/kg (Calculated) : 68.4  Temp (24hrs), Avg:98.4 F (36.9 C), Min:98 F (36.7 C), Max:98.7 F (37.1 C)  Recent Labs  Lab 06/02/18 1808 06/03/18 0324 06/04/18 0651 06/05/18 0404 06/05/18 2307 06/06/18 0635 06/07/18 0336  WBC  --  11.3* 8.0 5.8  --  12.0* 8.3  CREATININE  --  0.73 0.61 0.60*  --  0.67 0.66  LATICACIDVEN 1.05  --   --   --   --   --   --   VANCOTROUGH  --   --   --   --   --  8*  --   VANCOPEAK  --   --   --   --  25*  --   --     Estimated Creatinine Clearance: 103.3 mL/min (by C-G formula based on SCr of 0.66 mg/dL).    No Known Allergies  Antimicrobials this admission: 1/12 vancomycin >>  1/12 Unasyn >>   Dose adjustments this admission: none  Microbiology results: None  Thank you for allowing pharmacy to be a part of this patient's care.  Angelique Blonder, PharmD Clinical Pharmacist 06/07/2018 8:44 AM

## 2018-06-07 NOTE — Discharge Summary (Signed)
[facility] - [place] at [facility]  [name], 53 y.o., DOB 01/29/1966, MRN [id number]. Admission date: 06/02/2018 Discharge Date 06/07/2018 Primary MD [facility] Admitting Physician Auburn BilberryShreyang Aneka Fagerstrom, MD  Admission Diagnosis  Abcess  Discharge Diagnosis   Active Problems: Cellulitis of left hand Nicotine abuse History of IV heroin abuse Hypokalemia     Hospital Course  Earl Moreno  is a 53 y.o. male with no known medical history who is presenting to the emergency room with left hand swelling which started few days ago.  Patient states that he used IV heroin 7 days prior and subsequently developed swelling of the left hand.  Patient was noted to have cellulitis and abscess.  He was treated with IV antibiotics the abscess did not drain therefore orthopedic consult was obtained.  He went incision and drainage of the abscess.  Cultures from the abscess showed no growth.  He still has dressing in place.  He will need to follow-up with orthopedics.        Consults  orthopedic surgery  Significant Tests:  See full reports for all details     No results found.     Today   Subjective:   Earl Moreno denies any complaints currently o Objective:   Blood pressure (!) 165/84, pulse (!) 48, temperature 98 F (36.7 C), temperature source Oral, resp. rate 20, height 5\' 8"  (1.727 m), weight 69.6 kg, SpO2 99 %.  .  Intake/Output Summary (Last 24 hours) at 06/07/2018 1242 Last data filed at 06/07/2018 1041 Gross per 24 hour  Intake 6459.29 ml  Output -  Net 6459.29 ml    Exam VITAL SIGNS: Blood pressure (!) 165/84, pulse (!) 48, temperature 98 F (36.7 C), temperature source Oral, resp. rate 20, height 5\' 8"  (1.727 m), weight 69.6 kg, SpO2 99 %.  GENERAL:  53 y.o.-year-old patient lying in the bed with no acute distress.  EYES: Pupils equal, round, reactive to light and accommodation. No scleral icterus. Extraocular muscles intact.   HEENT: Head atraumatic, normocephalic. Oropharynx and nasopharynx clear.  NECK:  Supple, no jugular venous distention. No thyroid enlargement, no tenderness.  LUNGS: Normal breath sounds bilaterally, no wheezing, rales,rhonchi or crepitation. No use of accessory muscles of respiration.  CARDIOVASCULAR: S1, S2 normal. No murmurs, rubs, or gallops.  ABDOMEN: Soft, nontender, nondistended. Bowel sounds present. No organomegaly or mass.  EXTREMITIES: left hand dressing in place NEUROLOGIC: Cranial nerves II through XII are intact. Muscle strength 5/5 in all extremities. Sensation intact. Gait not checked.  PSYCHIATRIC: The patient is alert and oriented x 3.  SKIN: No obvious rash, lesion, or ulcer.   Data Review     CBC w Diff:  Lab Results  Component Value Date   WBC 8.3 06/07/2018   HGB 11.5 (L) 06/07/2018   HGB 14.7 01/20/2013   HCT 36.3 (L) 06/07/2018   HCT 42.0 01/20/2013   PLT 231 06/07/2018   PLT 180 01/20/2013   LYMPHOPCT 17 06/02/2018   LYMPHOPCT 21.7 01/20/2013   MONOPCT 7 06/02/2018   MONOPCT 8.8 01/20/2013   EOSPCT 1 06/02/2018   EOSPCT 3.7 01/20/2013   BASOPCT 0 06/02/2018   BASOPCT 0.7 01/20/2013   CMP:  Lab Results  Component Value Date   NA 142 06/07/2018   NA 140 01/19/2013   K 3.5 06/07/2018   K 3.6 01/19/2013   CL 108 06/07/2018   CL 112 (H) 01/19/2013   CO2 27 06/07/2018   CO2 21 01/19/2013  BUN 9 06/07/2018   BUN 12 01/19/2013   CREATININE 0.66 06/07/2018   CREATININE 0.64 01/19/2013   PROT 8.4 (H) 06/02/2018   PROT 7.7 12/30/2012   ALBUMIN 4.0 06/02/2018   ALBUMIN 3.9 12/30/2012   BILITOT 0.6 06/02/2018   BILITOT 0.3 12/30/2012   ALKPHOS 87 06/02/2018   ALKPHOS 97 12/30/2012   AST 19 06/02/2018   AST 44 (H) 12/30/2012   ALT 22 06/02/2018   ALT 57 12/30/2012  .  Micro Results Recent Results (from the past 240 hour(s))  MRSA PCR Screening     Status: None   Collection Time: 06/04/18 11:41 PM  Result Value Ref Range Status   MRSA by  PCR NEGATIVE NEGATIVE Final    Comment:        The GeneXpert MRSA Assay (FDA approved for NASAL specimens only), is one component of a comprehensive MRSA colonization surveillance program. It is not intended to diagnose MRSA infection nor to guide or monitor treatment for MRSA infections. Performed at [facility], [address] [place], [place] [address]   Aerobic/Anaerobic Culture (surgical/deep wound)     Status: None (Preliminary result)   Collection Time: 06/05/18  8:18 AM  Result Value Ref Range Status   Specimen Description   Final    WOUND LEFT HAND Performed at [facility], [address] [place], [place] [address]    Special Requests   Final    NONE Performed at [facility], [address] [place], [place] [address]    Gram Stain   Final    RARE WBC PRESENT, PREDOMINANTLY PMN NO ORGANISMS SEEN Performed at [facility], [address] [place], [place] [address]    Culture   Final    RARE NORMAL SKIN FLORA NO ANAEROBES ISOLATED; CULTURE IN PROGRESS FOR 5 DAYS    Report Status PENDING  Incomplete        Code Status Orders  (From admission, onward)         Start     Ordered   06/02/18 2044  Full code  Continuous     06/02/18 2043        Code Status History    This patient has a current code status but no historical code status.          Follow-up Information    Lyndle HerrlichBowers, James R, MD On 06/11/2018.   Specialty:  Orthopedic Surgery Why:  3PM Contact information: [address] [place] [address] [phone]        [facility] On 06/24/2018.   Specialty:  General Practice Why:  hosp f/u AT 220 Contact information: [address] [place] [address] [phone]           Discharge Medications   Allergies as of 06/07/2018   No Known Allergies     Medication List    STOP taking these medications   HYDROcodone-acetaminophen 5-325 MG  tablet Commonly known as:  NORCO     TAKE these medications   amoxicillin-clavulanate 875-125 MG tablet Commonly known as:  AUGMENTIN Take 1 tablet by mouth 2 (two) times daily for 7 days.   aspirin 81 MG chewable tablet Chew 1 tablet (81 mg total) by mouth daily.   doxycycline 100 MG tablet Commonly known as:  VIBRA-TABS Take 1 tablet (100 mg total) by mouth every 12 (twelve) hours for 7 days.   ibuprofen 600 MG tablet Commonly known as:  ADVIL,MOTRIN  Take 1 tablet (600 mg total) by mouth every 6 (six) hours as needed.   methadone 10 MG/5ML solution Commonly known as:  DOLOPHINE Take 50 mg by mouth daily.          Total Time in preparing paper work, data evaluation and todays exam - 35 minutes  Auburn Bilberry M.D on 06/07/2018 at 12:42 PM [facility]   Office  [phone]

## 2018-06-07 NOTE — Progress Notes (Signed)
Pt is alert and oriented and able to verbalize needs. No complaints of pain at this time. VSS. PIV removed. Discharge instructions gone over with patient and significant other at bedside. Follow up instructions and appointments gone over with pt. Pt verbalizes understanding. Hard scripts provided for abx and pt will take to med management to get filled. Pt verbalizes understanding of importance of completing all medications.   Suzan Slick, RN

## 2018-06-07 NOTE — Care Management (Signed)
Checked Price of Augmentin with [facility]- $40.25  Good RX 13.50 Printed Coupon for the good RX

## 2018-06-07 NOTE — Care Management (Signed)
Patient states that he is homeless and has no running water, he feels that he is not able to keep the wound clean and care for it without it getting infected.  He feels that he should not discharge until the Drain is pulled.  I notified Dr. Odis Luster and Dr. Allena Katz of what the patient has said.

## 2018-06-10 ENCOUNTER — Telehealth: Payer: Self-pay

## 2018-06-10 LAB — AEROBIC/ANAEROBIC CULTURE (SURGICAL/DEEP WOUND): CULTURE: NORMAL

## 2018-06-10 LAB — AEROBIC/ANAEROBIC CULTURE W GRAM STAIN (SURGICAL/DEEP WOUND)

## 2018-06-10 NOTE — Telephone Encounter (Signed)
EMMI Follow-up: Noted on the report that the patient did know who to contact if there was a change in his condition and responded to no to follow-up appointments.  I called to talk with Earl Moreno but his mother said he wasn't at home but she would look up his number and ask him to call me back.  Before I finished my note, Earl Moreno called [phone]) and said he was taking his antibiotics and and has a follow-up appointment with MD tomorrow to have his hand checked.  [name] he was still having some pain but said he would talk with MD about it tomorrow.  [name] he was aware of his Feb. 3 appointment with PCP. I let him know there would be a second automated call with a different series of questions and to let us know if he had any other concerns at that time.  I thanked for calling me back.

## 2020-01-05 ENCOUNTER — Ambulatory Visit: Payer: Disability Insurance | Attending: Internal Medicine

## 2020-01-05 DIAGNOSIS — Z23 Encounter for immunization: Secondary | ICD-10-CM

## 2020-01-05 NOTE — Progress Notes (Signed)
   Covid-19 Vaccination Clinic  Name:  Earl Moreno    MRN: [id number] DOB: 06/25/1965  01/05/2020  Earl Moreno was observed post Covid-19 immunization for 15 minutes without incident. He was provided with Vaccine Information Sheet and instruction to access the V-Safe system.   Earl Moreno was instructed to call 911 with any severe reactions post vaccine: Marland Kitchen Difficulty breathing  . Swelling of face and throat  . A fast heartbeat  . A bad rash all over body  . Dizziness and weakness   Immunizations Administered    Name Date Dose VIS Date Route   Pfizer COVID-19 Vaccine 01/05/2020  2:13 PM 0.3 mL 07/16/2018 Intramuscular   Manufacturer: [employer], [employer]   Lot: [id number]   NDC: [address]-6

## 2020-02-02 ENCOUNTER — Ambulatory Visit: Payer: Disability Insurance

## 2020-02-08 ENCOUNTER — Other Ambulatory Visit: Payer: Self-pay

## 2020-02-08 DIAGNOSIS — E43 Unspecified severe protein-calorie malnutrition: Secondary | ICD-10-CM | POA: Diagnosis present

## 2020-02-08 DIAGNOSIS — F1290 Cannabis use, unspecified, uncomplicated: Secondary | ICD-10-CM | POA: Diagnosis present

## 2020-02-08 DIAGNOSIS — F1123 Opioid dependence with withdrawal: Secondary | ICD-10-CM | POA: Diagnosis not present

## 2020-02-08 DIAGNOSIS — E872 Acidosis: Secondary | ICD-10-CM | POA: Diagnosis present

## 2020-02-08 DIAGNOSIS — L02414 Cutaneous abscess of left upper limb: Secondary | ICD-10-CM | POA: Diagnosis present

## 2020-02-08 DIAGNOSIS — F17210 Nicotine dependence, cigarettes, uncomplicated: Secondary | ICD-10-CM | POA: Diagnosis present

## 2020-02-08 DIAGNOSIS — F411 Generalized anxiety disorder: Secondary | ICD-10-CM | POA: Diagnosis present

## 2020-02-08 DIAGNOSIS — Z681 Body mass index (BMI) 19 or less, adult: Secondary | ICD-10-CM

## 2020-02-08 DIAGNOSIS — F6089 Other specific personality disorders: Secondary | ICD-10-CM | POA: Diagnosis present

## 2020-02-08 DIAGNOSIS — B1920 Unspecified viral hepatitis C without hepatic coma: Secondary | ICD-10-CM | POA: Diagnosis present

## 2020-02-08 DIAGNOSIS — E1165 Type 2 diabetes mellitus with hyperglycemia: Principal | ICD-10-CM | POA: Diagnosis present

## 2020-02-08 DIAGNOSIS — Z833 Family history of diabetes mellitus: Secondary | ICD-10-CM

## 2020-02-08 DIAGNOSIS — L02413 Cutaneous abscess of right upper limb: Secondary | ICD-10-CM | POA: Diagnosis present

## 2020-02-08 DIAGNOSIS — Z20822 Contact with and (suspected) exposure to covid-19: Secondary | ICD-10-CM | POA: Diagnosis present

## 2020-02-08 DIAGNOSIS — Z23 Encounter for immunization: Secondary | ICD-10-CM

## 2020-02-08 DIAGNOSIS — Z590 Homelessness: Secondary | ICD-10-CM

## 2020-02-08 DIAGNOSIS — I10 Essential (primary) hypertension: Secondary | ICD-10-CM | POA: Diagnosis present

## 2020-02-08 DIAGNOSIS — F4329 Adjustment disorder with other symptoms: Secondary | ICD-10-CM | POA: Diagnosis present

## 2020-02-08 DIAGNOSIS — F064 Anxiety disorder due to known physiological condition: Secondary | ICD-10-CM | POA: Diagnosis present

## 2020-02-08 DIAGNOSIS — R7881 Bacteremia: Secondary | ICD-10-CM | POA: Diagnosis present

## 2020-02-08 DIAGNOSIS — F3160 Bipolar disorder, current episode mixed, unspecified: Secondary | ICD-10-CM | POA: Diagnosis present

## 2020-02-08 DIAGNOSIS — E860 Dehydration: Secondary | ICD-10-CM | POA: Diagnosis present

## 2020-02-08 DIAGNOSIS — B9689 Other specified bacterial agents as the cause of diseases classified elsewhere: Secondary | ICD-10-CM | POA: Diagnosis present

## 2020-02-08 DIAGNOSIS — F1420 Cocaine dependence, uncomplicated: Secondary | ICD-10-CM | POA: Diagnosis present

## 2020-02-08 DIAGNOSIS — Z7982 Long term (current) use of aspirin: Secondary | ICD-10-CM

## 2020-02-08 LAB — CBC WITH DIFFERENTIAL/PLATELET
Abs Immature Granulocytes: 0.03 10*3/uL (ref 0.00–0.07)
Basophils Absolute: 0.1 10*3/uL (ref 0.0–0.1)
Basophils Relative: 1 %
Eosinophils Absolute: 0.1 10*3/uL (ref 0.0–0.5)
Eosinophils Relative: 1 %
HCT: 43.7 % (ref 39.0–52.0)
Hemoglobin: 15.6 g/dL (ref 13.0–17.0)
Immature Granulocytes: 0 %
Lymphocytes Relative: 23 %
Lymphs Abs: 2.1 10*3/uL (ref 0.7–4.0)
MCH: 30.2 pg (ref 26.0–34.0)
MCHC: 35.7 g/dL (ref 30.0–36.0)
MCV: 84.7 fL (ref 80.0–100.0)
Monocytes Absolute: 0.6 10*3/uL (ref 0.1–1.0)
Monocytes Relative: 6 %
Neutro Abs: 6.5 10*3/uL (ref 1.7–7.7)
Neutrophils Relative %: 69 %
Platelets: 288 10*3/uL (ref 150–400)
RBC: 5.16 MIL/uL (ref 4.22–5.81)
RDW: 12.5 % (ref 11.5–15.5)
WBC: 9.4 10*3/uL (ref 4.0–10.5)
nRBC: 0 % (ref 0.0–0.2)

## 2020-02-08 LAB — COMPREHENSIVE METABOLIC PANEL
ALT: 23 U/L (ref 0–44)
AST: 21 U/L (ref 15–41)
Albumin: 3.7 g/dL (ref 3.5–5.0)
Alkaline Phosphatase: 128 U/L — ABNORMAL HIGH (ref 38–126)
Anion gap: 13 (ref 5–15)
BUN: 10 mg/dL (ref 6–20)
CO2: 26 mmol/L (ref 22–32)
Calcium: 9.3 mg/dL (ref 8.9–10.3)
Chloride: 85 mmol/L — ABNORMAL LOW (ref 98–111)
Creatinine, Ser: 0.79 mg/dL (ref 0.61–1.24)
GFR calc Af Amer: >60 mL/min (ref >60)
GFR calc non Af Amer: >60 mL/min (ref >60)
Glucose, Bld: 828 mg/dL (ref 70–99)
Potassium: 4.2 mmol/L (ref 3.5–5.1)
Sodium: 124 mmol/L — ABNORMAL LOW (ref 135–145)
Total Bilirubin: 0.9 mg/dL (ref 0.3–1.2)
Total Protein: 8.5 g/dL — ABNORMAL HIGH (ref 6.5–8.1)

## 2020-02-08 MED ORDER — SODIUM CHLORIDE 0.9 % IV BOLUS
1000.0000 mL | Freq: Once | INTRAVENOUS | Status: AC
  Administered 2020-02-08: 1000 mL via INTRAVENOUS

## 2020-02-08 NOTE — ED Notes (Signed)
Pt ambu to toilet urine collected and sent

## 2020-02-08 NOTE — ED Triage Notes (Signed)
Patient with multiple abscess.  One on left wrist, right wrist and right antecub.

## 2020-02-09 ENCOUNTER — Emergency Department: Payer: Disability Insurance

## 2020-02-09 ENCOUNTER — Inpatient Hospital Stay
Admission: EM | Admit: 2020-02-09 | Discharge: 2020-02-14 | DRG: 622 | Disposition: A | Discharge status: Home / Self Care | Payer: Disability Insurance | Attending: Internal Medicine | Admitting: Internal Medicine

## 2020-02-09 DIAGNOSIS — R739 Hyperglycemia, unspecified: Secondary | ICD-10-CM

## 2020-02-09 DIAGNOSIS — L02414 Cutaneous abscess of left upper limb: Secondary | ICD-10-CM

## 2020-02-09 DIAGNOSIS — E1165 Type 2 diabetes mellitus with hyperglycemia: Secondary | ICD-10-CM | POA: Diagnosis present

## 2020-02-09 DIAGNOSIS — E871 Hypo-osmolality and hyponatremia: Secondary | ICD-10-CM | POA: Diagnosis present

## 2020-02-09 DIAGNOSIS — L0291 Cutaneous abscess, unspecified: Secondary | ICD-10-CM | POA: Diagnosis present

## 2020-02-09 DIAGNOSIS — E1100 Type 2 diabetes mellitus with hyperosmolarity without nonketotic hyperglycemic-hyperosmolar coma (NKHHC): Secondary | ICD-10-CM

## 2020-02-09 DIAGNOSIS — F1120 Opioid dependence, uncomplicated: Secondary | ICD-10-CM | POA: Diagnosis present

## 2020-02-09 DIAGNOSIS — R7989 Other specified abnormal findings of blood chemistry: Secondary | ICD-10-CM

## 2020-02-09 DIAGNOSIS — E119 Type 2 diabetes mellitus without complications: Secondary | ICD-10-CM

## 2020-02-09 DIAGNOSIS — F1910 Other psychoactive substance abuse, uncomplicated: Secondary | ICD-10-CM

## 2020-02-09 DIAGNOSIS — F1920 Other psychoactive substance dependence, uncomplicated: Secondary | ICD-10-CM | POA: Diagnosis present

## 2020-02-09 DIAGNOSIS — E43 Unspecified severe protein-calorie malnutrition: Secondary | ICD-10-CM | POA: Insufficient documentation

## 2020-02-09 DIAGNOSIS — I10 Essential (primary) hypertension: Secondary | ICD-10-CM | POA: Diagnosis present

## 2020-02-09 LAB — GLUCOSE, CAPILLARY
Glucose-Capillary: 422 mg/dL — ABNORMAL HIGH (ref 70–99)
Glucose-Capillary: 507 mg/dL (ref 70–99)
Glucose-Capillary: 374 mg/dL — ABNORMAL HIGH (ref 70–99)
Glucose-Capillary: 325 mg/dL — ABNORMAL HIGH (ref 70–99)
Glucose-Capillary: 426 mg/dL — ABNORMAL HIGH (ref 70–99)
Glucose-Capillary: 383 mg/dL — ABNORMAL HIGH (ref 70–99)
Glucose-Capillary: 195 mg/dL — ABNORMAL HIGH (ref 70–99)
Glucose-Capillary: 224 mg/dL — ABNORMAL HIGH (ref 70–99)
Glucose-Capillary: 298 mg/dL — ABNORMAL HIGH (ref 70–99)
Glucose-Capillary: 278 mg/dL — ABNORMAL HIGH (ref 70–99)
Glucose-Capillary: 192 mg/dL — ABNORMAL HIGH (ref 70–99)
Glucose-Capillary: 417 mg/dL — ABNORMAL HIGH (ref 70–99)

## 2020-02-09 LAB — URINE DRUG SCREEN, QUALITATIVE (ARMC ONLY)
Amphetamines, Ur Screen: NOT DETECTED
Barbiturates, Ur Screen: NOT DETECTED
Benzodiazepine, Ur Scrn: NOT DETECTED
Cannabinoid 50 Ng, Ur ~~LOC~~: NOT DETECTED
Cocaine Metabolite,Ur ~~LOC~~: POSITIVE — AB
MDMA (Ecstasy)Ur Screen: NOT DETECTED
Methadone Scn, Ur: NOT DETECTED
Opiate, Ur Screen: POSITIVE — AB
Phencyclidine (PCP) Ur S: NOT DETECTED
Tricyclic, Ur Screen: NOT DETECTED

## 2020-02-09 LAB — BASIC METABOLIC PANEL
Anion gap: 12 (ref 5–15)
BUN: 9 mg/dL (ref 6–20)
CO2: 26 mmol/L (ref 22–32)
Calcium: 8.9 mg/dL (ref 8.9–10.3)
Chloride: 94 mmol/L — ABNORMAL LOW (ref 98–111)
Creatinine, Ser: 0.62 mg/dL (ref 0.61–1.24)
GFR calc Af Amer: >60 mL/min (ref >60)
GFR calc non Af Amer: >60 mL/min (ref >60)
Glucose, Bld: 519 mg/dL (ref 70–99)
Potassium: 3.8 mmol/L (ref 3.5–5.1)
Sodium: 132 mmol/L — ABNORMAL LOW (ref 135–145)

## 2020-02-09 LAB — URINALYSIS, COMPLETE (UACMP) WITH MICROSCOPIC
Bilirubin Urine: NEGATIVE
Glucose, UA: >=500 mg/dL — AB
Hgb urine dipstick: NEGATIVE
Ketones, ur: NEGATIVE mg/dL
Nitrite: NEGATIVE
Protein, ur: NEGATIVE mg/dL
Specific Gravity, Urine: 1.031 — ABNORMAL HIGH (ref 1.005–1.030)
WBC, UA: >50 WBC/hpf — ABNORMAL HIGH (ref 0–5)
pH: 8 (ref 5.0–8.0)

## 2020-02-09 LAB — HEPATITIS PANEL, ACUTE
HCV Ab: REACTIVE — AB
Hep A IgM: NONREACTIVE
Hep B C IgM: NONREACTIVE
Hepatitis B Surface Ag: NONREACTIVE

## 2020-02-09 LAB — ETHANOL: Alcohol, Ethyl (B): <10 mg/dL (ref <10)

## 2020-02-09 LAB — OSMOLALITY: Osmolality: 304 mOsm/kg — ABNORMAL HIGH (ref 275–295)

## 2020-02-09 LAB — HIV ANTIBODY (ROUTINE TESTING W REFLEX): HIV Screen 4th Generation wRfx: NONREACTIVE

## 2020-02-09 LAB — BETA-HYDROXYBUTYRIC ACID: Beta-Hydroxybutyric Acid: 1.83 mmol/L — ABNORMAL HIGH (ref 0.05–0.27)

## 2020-02-09 LAB — SARS CORONAVIRUS 2 BY RT PCR (HOSPITAL ORDER, PERFORMED IN ~~LOC~~ HOSPITAL LAB): SARS Coronavirus 2: NEGATIVE

## 2020-02-09 MED ORDER — ENOXAPARIN SODIUM 40 MG/0.4ML ~~LOC~~ SOLN
40.0000 mg | SUBCUTANEOUS | Status: DC
  Administered 2020-02-09 – 2020-02-13 (×5): 40 mg via SUBCUTANEOUS
  Filled 2020-02-09 (×5): qty 0.4

## 2020-02-09 MED ORDER — ONDANSETRON HCL 4 MG/2ML IJ SOLN: 4.0000 mg | Freq: Four times a day (QID) | INTRAMUSCULAR | Status: DC | PRN

## 2020-02-09 MED ORDER — LIVING WELL WITH DIABETES BOOK
Freq: Once | Status: AC
  Filled 2020-02-09: qty 1

## 2020-02-09 MED ORDER — DEXTROSE IN LACTATED RINGERS 5 % IV SOLN: INTRAVENOUS | Status: DC

## 2020-02-09 MED ORDER — SODIUM CHLORIDE 0.9% FLUSH
10.0000 mL | Freq: Two times a day (BID) | INTRAVENOUS | Status: DC
  Administered 2020-02-09 – 2020-02-10 (×3): 10 mL
  Administered 2020-02-10 – 2020-02-11 (×2): 20 mL
  Administered 2020-02-12 – 2020-02-13 (×3): 10 mL

## 2020-02-09 MED ORDER — POTASSIUM CHLORIDE 10 MEQ/100ML IV SOLN: 10.0000 meq | INTRAVENOUS | Status: AC

## 2020-02-09 MED ORDER — INSULIN ASPART 100 UNIT/ML ~~LOC~~ SOLN
SUBCUTANEOUS | Status: AC
  Administered 2020-02-09: 3 [IU] via SUBCUTANEOUS
  Filled 2020-02-09: qty 1

## 2020-02-09 MED ORDER — ACETAMINOPHEN 650 MG RE SUPP: 650.0000 mg | Freq: Four times a day (QID) | RECTAL | Status: DC | PRN

## 2020-02-09 MED ORDER — TETANUS-DIPHTH-ACELL PERTUSSIS 5-2.5-18.5 LF-MCG/0.5 IM SUSP
0.5000 mL | Freq: Once | INTRAMUSCULAR | Status: AC
  Administered 2020-02-09: 0.5 mL via INTRAMUSCULAR
  Filled 2020-02-09: qty 0.5

## 2020-02-09 MED ORDER — INSULIN ASPART 100 UNIT/ML ~~LOC~~ SOLN
0.0000 [IU] | Freq: Three times a day (TID) | SUBCUTANEOUS | Status: DC
  Administered 2020-02-10: 8 [IU] via SUBCUTANEOUS
  Administered 2020-02-10: 5 [IU] via SUBCUTANEOUS
  Administered 2020-02-10 – 2020-02-11 (×2): 8 [IU] via SUBCUTANEOUS
  Administered 2020-02-11: 11 [IU] via SUBCUTANEOUS
  Administered 2020-02-11: 8 [IU] via SUBCUTANEOUS
  Administered 2020-02-12 (×2): 15 [IU] via SUBCUTANEOUS
  Administered 2020-02-12: 8 [IU] via SUBCUTANEOUS
  Filled 2020-02-09 (×9): qty 1

## 2020-02-09 MED ORDER — AMLODIPINE BESYLATE 10 MG PO TABS
10.0000 mg | ORAL_TABLET | Freq: Every day | ORAL | Status: DC
  Administered 2020-02-10 – 2020-02-14 (×5): 10 mg via ORAL
  Filled 2020-02-09: qty 1
  Filled 2020-02-09: qty 2
  Filled 2020-02-09 (×4): qty 1

## 2020-02-09 MED ORDER — INSULIN ASPART 100 UNIT/ML ~~LOC~~ SOLN
5.0000 [IU] | Freq: Once | SUBCUTANEOUS | Status: AC
  Administered 2020-02-09: 5 [IU] via SUBCUTANEOUS
  Filled 2020-02-09: qty 1

## 2020-02-09 MED ORDER — HYDROMORPHONE HCL 1 MG/ML IJ SOLN
1.0000 mg | Freq: Once | INTRAMUSCULAR | Status: AC | PRN
  Administered 2020-02-09: 1 mg via INTRAVENOUS
  Filled 2020-02-09 (×2): qty 1

## 2020-02-09 MED ORDER — INSULIN REGULAR(HUMAN) IN NACL 100-0.9 UT/100ML-% IV SOLN: INTRAVENOUS | Status: DC

## 2020-02-09 MED ORDER — MORPHINE SULFATE (PF) 2 MG/ML IV SOLN
2.0000 mg | INTRAVENOUS | Status: DC | PRN
  Administered 2020-02-09: 2 mg via INTRAVENOUS
  Administered 2020-02-09: 4 mg via INTRAVENOUS
  Filled 2020-02-09: qty 2

## 2020-02-09 MED ORDER — INSULIN DETEMIR 100 UNIT/ML ~~LOC~~ SOLN
20.0000 [IU] | Freq: Every day | SUBCUTANEOUS | Status: DC
  Administered 2020-02-09 – 2020-02-10 (×2): 20 [IU] via SUBCUTANEOUS
  Filled 2020-02-09 (×3): qty 0.2

## 2020-02-09 MED ORDER — ENOXAPARIN SODIUM 40 MG/0.4ML ~~LOC~~ SOLN: 40.0000 mg | SUBCUTANEOUS | Status: DC

## 2020-02-09 MED ORDER — ACETAMINOPHEN 500 MG PO TABS
1000.0000 mg | ORAL_TABLET | Freq: Four times a day (QID) | ORAL | Status: DC | PRN
  Administered 2020-02-10 – 2020-02-12 (×2): 1000 mg via ORAL
  Filled 2020-02-09 (×2): qty 2

## 2020-02-09 MED ORDER — DEXTROSE 50 % IV SOLN: 0.0000 mL | INTRAVENOUS | Status: DC | PRN

## 2020-02-09 MED ORDER — ACETAMINOPHEN 325 MG PO TABS: 650.0000 mg | ORAL_TABLET | Freq: Four times a day (QID) | ORAL | Status: DC | PRN

## 2020-02-09 MED ORDER — ONDANSETRON HCL 4 MG PO TABS: 4.0000 mg | ORAL_TABLET | Freq: Four times a day (QID) | ORAL | Status: DC | PRN

## 2020-02-09 MED ORDER — DOXYCYCLINE HYCLATE 100 MG PO TABS
100.0000 mg | ORAL_TABLET | Freq: Two times a day (BID) | ORAL | Status: DC
  Administered 2020-02-09 (×2): 100 mg via ORAL
  Filled 2020-02-09 (×2): qty 1

## 2020-02-09 MED ORDER — LACTATED RINGERS IV SOLN: INTRAVENOUS | Status: DC

## 2020-02-09 MED ORDER — INSULIN REGULAR(HUMAN) IN NACL 100-0.9 UT/100ML-% IV SOLN
INTRAVENOUS | Status: DC
  Administered 2020-02-09: 5.5 [IU]/h via INTRAVENOUS
  Filled 2020-02-09: qty 100

## 2020-02-09 MED ORDER — VANCOMYCIN HCL IN DEXTROSE 1-5 GM/200ML-% IV SOLN
1000.0000 mg | Freq: Once | INTRAVENOUS | Status: AC
  Administered 2020-02-09: 1000 mg via INTRAVENOUS
  Filled 2020-02-09: qty 200

## 2020-02-09 MED ORDER — KETOROLAC TROMETHAMINE 15 MG/ML IJ SOLN
15.0000 mg | Freq: Four times a day (QID) | INTRAMUSCULAR | Status: AC | PRN
  Administered 2020-02-09 – 2020-02-14 (×16): 15 mg via INTRAVENOUS
  Filled 2020-02-09 (×18): qty 1

## 2020-02-09 MED ORDER — LIDOCAINE HCL (PF) 1 % IJ SOLN
INTRAMUSCULAR | Status: AC
  Filled 2020-02-09: qty 10

## 2020-02-09 MED ORDER — LIDOCAINE-EPINEPHRINE 1 %-1:100000 IJ SOLN
20.0000 mL | Freq: Once | INTRAMUSCULAR | Status: AC
  Administered 2020-02-09: 20 mL via INTRADERMAL
  Filled 2020-02-09: qty 20

## 2020-02-09 MED ORDER — LIDOCAINE HCL (PF) 1 % IJ SOLN
INTRAMUSCULAR | Status: AC
  Filled 2020-02-09: qty 5

## 2020-02-09 MED ORDER — HYDROMORPHONE HCL 1 MG/ML IJ SOLN
1.0000 mg | INTRAMUSCULAR | Status: DC | PRN
  Administered 2020-02-10 (×3): 1 mg via INTRAVENOUS
  Filled 2020-02-09 (×3): qty 1

## 2020-02-09 MED ORDER — OXYCODONE HCL 5 MG PO TABS
5.0000 mg | ORAL_TABLET | ORAL | Status: DC | PRN
  Administered 2020-02-09: 5 mg via ORAL
  Filled 2020-02-09: qty 1

## 2020-02-09 MED ORDER — HYDROMORPHONE HCL 1 MG/ML IJ SOLN
1.0000 mg | Freq: Once | INTRAMUSCULAR | Status: AC | PRN
  Administered 2020-02-09: 1 mg via INTRAVENOUS
  Filled 2020-02-09: qty 1

## 2020-02-09 MED ORDER — NICOTINE POLACRILEX 2 MG MT GUM
2.0000 mg | CHEWING_GUM | OROMUCOSAL | Status: DC | PRN
  Administered 2020-02-09 – 2020-02-14 (×22): 2 mg via ORAL
  Filled 2020-02-09 (×27): qty 1

## 2020-02-09 MED ORDER — LACTATED RINGERS IV BOLUS
1000.0000 mL | Freq: Once | INTRAVENOUS | Status: AC
  Administered 2020-02-09: 1000 mL via INTRAVENOUS

## 2020-02-09 MED ORDER — INSULIN STARTER KIT- PEN NEEDLES (ENGLISH)
1.0000 | Freq: Once | Status: DC
  Filled 2020-02-09: qty 1

## 2020-02-09 MED ORDER — MORPHINE SULFATE (PF) 2 MG/ML IV SOLN
2.0000 mg | INTRAVENOUS | Status: DC | PRN
  Filled 2020-02-09: qty 1

## 2020-02-09 MED ORDER — SODIUM CHLORIDE 0.9% FLUSH: 10.0000 mL | INTRAVENOUS | Status: DC | PRN

## 2020-02-09 NOTE — H&P (Signed)
History and Physical    KROSS SWALLOWS YTK:[id number] DOB: November 08, 1965 DOA: 02/09/2020  PCP: Patient, No Pcp Per   Patient coming from: Home  I have personally briefly reviewed patient's old medical records in [facility]  Chief Complaint: Multiple abscesses  HPI: CURVIN HUNGER is a 54 y.o. male with medical history significant for IV drug use (patient admits to being a heroin addict) and uses intranasal cocaine as well.  He was brought into the emergency room by his wife for evaluation of multiple abscesses involving both left and right wrist as well as left forearm.  He has had these abscesses for about a week and the one on his left forearm appears to be draining.  His wife states that he has had about a 30 pound weight loss over the last couple of weeks associated with increasing thirst, urinary frequency and worsening fatigue.  He denies having any fever chills, he has no shortness of breath, no abdominal pain, no chest pain, no changes in his bowel habits or any urinary symptoms. Patient has a strong family history of diabetes mellitus His last IV drug use was 1 day prior to his admission. Labs show sodium 124, potassium 4.2, chloride 85, bicarb 26, serum glucose 828, BUN 9, creatinine 0.79, calcium 9.3, albumin 3.7, AST 21, ALT 23, white count 9.4, hemoglobin 15.6, hematocrit 43.7, MCV 84.7, RDW 12.5, platelet count 288 Chest x-ray reviewed by me shows no infiltrate or effusion Left forearm x-ray shows no soft tissue gas, retained needle, or bony erosion. Twelve-lead EKG reviewed by me shows sinus tachycardia   ED Course: Patient is a 54 year old Caucasian male who is a heroin addict and who presented to the ER for evaluation of multiple abscesses on both wrist as well as his left forearm.  He also gives a history of significant weight loss, increasing thirst, urinary frequency and increased fatigue.  His labs revealed a glucose of greater than 800 and normal serum bicarbonate  level.  Patient was started on aggressive IV fluid hydration and received a dose of IV vancomycin.  He is currently on regular insulin infusion will be admitted to the hospital for further evaluation.  Review of Systems: As per HPI otherwise 10 point review of systems negative.    No past medical history on file.  Past Surgical History:  Procedure Laterality Date   I & D EXTREMITY Left 06/05/2018   Procedure: IRRIGATION AND DEBRIDEMENT EXTREMITY-LEFT HAND;  Surgeon: Lyndle Herrlich, MD;  Location: [facility] ORS;  Service: Orthopedics;  Laterality: Left;     reports that he has been smoking. He has been smoking about 0.50 packs per day. He has never used smokeless tobacco. He reports current drug use. Drugs: Cocaine and IV. He reports that he does not drink alcohol.  No Known Allergies  Family History  Family history unknown: Yes     Prior to Admission medications   Not on File    Physical Exam: Vitals:   02/08/20 2048 02/09/20 0700 02/09/20 0730 02/09/20 0745  BP: (!) 134/102 (!) 131/92 (!) 140/100   Pulse: (!) 103 66 70 73  Resp: 20 17 20 15   Temp: 98.7 F (37.1 C)     TempSrc: Oral     SpO2: 97% 99% 99% 100%  Weight: 59 kg     Height: 5\' 8"  (1.727 m)        Vitals:   02/08/20 2048 02/09/20 0700 02/09/20 0730 02/09/20 0745  BP: (!) 134/102 (!) 131/92 02/11/20)  140/100   Pulse: (!) 103 66 70 73  Resp: 20 17 20 15   Temp: 98.7 F (37.1 C)     TempSrc: Oral     SpO2: 97% 99% 99% 100%  Weight: 59 kg     Height: 5\' 8"  (1.727 m)       Constitutional: NAD, alert and oriented x 3.  Eyes: PERRL, lids and conjunctivae normal ENMT: Mucous membranes are dry.  Neck: normal, supple, no masses, no thyromegaly Respiratory: clear to auscultation bilaterally, no wheezing, no crackles. Normal respiratory effort. No accessory muscle use.  Cardiovascular: Tachycardic, no murmurs / rubs / gallops. No extremity edema. 2+ pedal pulses. No carotid bruits.  Abdomen: no tenderness, no masses  palpated. No hepatosplenomegaly. Bowel sounds positive.  Musculoskeletal: no clubbing / cyanosis.  Abscesses over both wrists and left forearm Skin: Abscesses over both wrist and left forearm Neurologic: No gross focal neurologic deficit. Psychiatric: Irritable   Labs on Admission: I have personally reviewed following labs and imaging studies  CBC: Recent Labs  Lab 02/08/20 2129  WBC 9.4  NEUTROABS 6.5  HGB 15.6  HCT 43.7  MCV 84.7  PLT 288   Basic Metabolic Panel: Recent Labs  Lab 02/08/20 2129 02/09/20 0345  NA 124* 132*  K 4.2 3.8  CL 85* 94*  CO2 26 26  GLUCOSE 828* 519*  BUN 10 9  CREATININE 0.79 0.62  CALCIUM 9.3 8.9   GFR: Estimated Creatinine Clearance: 87.1 mL/min (by C-G formula based on SCr of 0.62 mg/dL). Liver Function Tests: Recent Labs  Lab 02/08/20 2129  AST 21  ALT 23  ALKPHOS 128*  BILITOT 0.9  PROT 8.5*  ALBUMIN 3.7   No results for input(s): LIPASE, AMYLASE in the last 168 hours. No results for input(s): AMMONIA in the last 168 hours. Coagulation Profile: No results for input(s): INR, PROTIME in the last 168 hours. Cardiac Enzymes: No results for input(s): CKTOTAL, CKMB, CKMBINDEX, TROPONINI in the last 168 hours. BNP (last 3 results) No results for input(s): PROBNP in the last 8760 hours. HbA1C: No results for input(s): HGBA1C in the last 72 hours. CBG: Recent Labs  Lab 02/09/20 0548 02/09/20 0625 02/09/20 0654 02/09/20 0821  GLUCAP 507* 422* 374* 325*   Lipid Profile: No results for input(s): CHOL, HDL, LDLCALC, TRIG, CHOLHDL, LDLDIRECT in the last 72 hours. Thyroid Function Tests: No results for input(s): TSH, T4TOTAL, FREET4, T3FREE, THYROIDAB in the last 72 hours. Anemia Panel: No results for input(s): VITAMINB12, FOLATE, FERRITIN, TIBC, IRON, RETICCTPCT in the last 72 hours. Urine analysis:    Component Value Date/Time   COLORURINE YELLOW (A) 06/02/2018 1803   APPEARANCEUR CLEAR (A) 06/02/2018 1803   APPEARANCEUR  Cloudy 12/30/2012 1018   LABSPEC 1.016 06/02/2018 1803   LABSPEC 1.017 12/30/2012 1018   PHURINE 6.0 06/02/2018 1803   GLUCOSEU NEGATIVE 06/02/2018 1803   GLUCOSEU Negative 12/30/2012 1018   HGBUR SMALL (A) 06/02/2018 1803   BILIRUBINUR NEGATIVE 06/02/2018 1803   BILIRUBINUR Negative 12/30/2012 1018   KETONESUR NEGATIVE 06/02/2018 1803   PROTEINUR NEGATIVE 06/02/2018 1803   NITRITE NEGATIVE 06/02/2018 1803   LEUKOCYTESUR NEGATIVE 06/02/2018 1803   LEUKOCYTESUR Negative 12/30/2012 1018    Radiological Exams on Admission: DG Chest 2 View  Result Date: 02/09/2020 CLINICAL DATA:  Tachycardia EXAM: CHEST - 2 VIEW COMPARISON:  01/06/2015 FINDINGS: The heart size and mediastinal contours are within normal limits. Both lungs are clear. The visualized skeletal structures are unremarkable. IMPRESSION: No active cardiopulmonary disease. Electronically Signed  By: Helyn Numbers MD   On: 02/09/2020 04:55   DG Forearm Left  Result Date: 02/09/2020 CLINICAL DATA:  Intravenous drug abuse.  Abscess EXAM: LEFT FOREARM - 2 VIEW COMPARISON:  02/10/2012 FINDINGS: Focal soft tissue swelling along the proximal ventral forearm. No soft tissue emphysema is seen. No metallic foreign body to suggest retained needle fragment. A vague high-density over this area is nonspecific. No bony erosion or elbow joint effusion. IMPRESSION: History of abscess with focal soft tissue swelling at the ventral and proximal forearm. No soft tissue gas, retained needle, or bony erosion. Electronically Signed   By: Marnee Spring M.D.   On: 02/09/2020 04:55   DG Wrist Complete Left  Result Date: 02/09/2020 CLINICAL DATA:  Left hand abscess. EXAM: LEFT WRIST - COMPLETE 3+ VIEW COMPARISON:  None. FINDINGS: There is no evidence of fracture or dislocation. There is no evidence of arthropathy or other focal bone abnormality. Soft tissues are unremarkable. IMPRESSION: Negative. Electronically Signed   By: Marnee Spring M.D.   On:  02/09/2020 04:53   DG Wrist Complete Right  Result Date: 02/09/2020 CLINICAL DATA:  Left hand abscess.  IV drug abuse. EXAM: RIGHT WRIST - COMPLETE 3+ VIEW COMPARISON:  None. FINDINGS: There is no evidence of fracture or dislocation. Possible mild skin irregularity to the dorsal right hand. Negative for bony erosion or soft tissue gas. Ganglia/cystic type lucencies at the scaphoid and hamate, incidental. IMPRESSION: No acute osseous finding or soft tissue emphysema. Electronically Signed   By: Marnee Spring M.D.   On: 02/09/2020 04:53    EKG: Independently reviewed.  Sinus tachycardia  Assessment/Plan Principal Problem:   Hyperglycemia due to type 2 diabetes mellitus (HCC) Active Problems:   New onset type 2 diabetes mellitus (HCC)   Abscess   Heroin addiction (HCC)   Hyponatremia   Essential hypertension     Hyperglycemia due to type 2 diabetes mellitus Patient appears to have new onset diabetes mellitus and presents with significant weight loss, increased thirst, urinary frequency and fatigue Initial blood sugar was greater than 800g/dl  Continue aggressive IV fluid hydration Continue insulin drip Obtain hemoglobin A1c levels Patient will require diabetic education and nutrition evaluation   Multiple abscesses Patient with skin/soft tissue infection involving both wrists and left forearm Place patient on doxycycline for MRSA coverage Consult surgery for incision and drainage   Hypertension Uncontrolled We will start patient on amlodipine 10 mg daily   Hyponatremia Secondary to significant hyperglycemia Expect improvement in serum sodium levels with hydration and resolution of hyperglycemia   Polysubstance dependence Patient admits to heroin and cocaine use Patient has been counseled on the need to abstain from illicit drug use   DVT prophylaxis: Lovenox Code Status: Full code Family Communication: Greater than 50% of time was spent discussing plan of care  with patient and his wife at the bedside.  All questions and concerns have been addressed.  He verbalizes understanding and agrees with the plan. Disposition Plan: Back to previous home environment Consults called: Surgery    Elsie Sakuma MD [facility]     02/09/2020, 9:29 AM

## 2020-02-09 NOTE — ED Notes (Signed)
Report called to receiving RN, awaiting pt transportation to inpatient ready bed

## 2020-02-09 NOTE — Progress Notes (Signed)
PHARMACY -  BRIEF ANTIBIOTIC NOTE   Pharmacy has received consult(s) for Vancomycin from an ED provider.  The patient's profile has been reviewed for ht/wt/allergies/indication/available labs.    One time order(s) placed for Vancomycin 1gm  Further antibiotics/pharmacy consults should be ordered by admitting physician if indicated.                       Thank you, Valrie Hart A 02/09/2020  4:02 AM

## 2020-02-09 NOTE — ED Notes (Signed)
This RN and Sam RN to bedside at this time.

## 2020-02-09 NOTE — ED Notes (Signed)
Pt given a warm blanket 

## 2020-02-09 NOTE — ED Notes (Signed)
Meal tray given to pt at this time.

## 2020-02-09 NOTE — ED Notes (Signed)
MD messaged regarding ativan for COWS assessment and stronger pain medication

## 2020-02-09 NOTE — ED Notes (Signed)
Pharmacy contacted to send toradol and nicorette gum. Pharmacy technician said they will send.

## 2020-02-09 NOTE — ED Notes (Signed)
MD Para March message regarding COWS scale and request for ativan for pt at this time. Will wait for orders.

## 2020-02-09 NOTE — ED Notes (Signed)
Messaged MD regarding pt pain medication

## 2020-02-09 NOTE — ED Notes (Signed)
IV team at bedside 

## 2020-02-09 NOTE — ED Notes (Signed)
Assumed care of pt, complaining of 10/10 pain. Pt medicated per MAR. Wife at bedside. AO x4. Pt talking in full sentences with regular and unlabored breathing.

## 2020-02-09 NOTE — ED Notes (Signed)
Pt screaming and cussing at this time. Pt stated "this is a bunch of bullshit, I want something for pain"   Pt advised that we messaged the doctor for pain medicine at this time, and are waiting for new orders.

## 2020-02-09 NOTE — ED Notes (Signed)
Pt yelling and cussing ringing call bell. Pt stating he has been told hours ago he would get something for pain. Pt advised that we have not heard back from MD yet.

## 2020-02-09 NOTE — ED Notes (Signed)
Pt transported to xray 

## 2020-02-09 NOTE — ED Notes (Signed)
Zach PA at bedside at this time for bedside I&D.

## 2020-02-09 NOTE — ED Notes (Signed)
Pt sitting up, eating breakfast. L arm bending while eating, IV pump occluded as a result. Reminded pt to straighten arm, but he states it hurts to keep it straight. Continuing to assess.

## 2020-02-09 NOTE — Procedures (Addendum)
PROCEDURE NOTE  Date: 02/09/20 10:54 AM  Procedure: Incision and Drainage; abscess Left Forearm  Indications: Left Forearm Abscess  Performing Provider: Lynden Oxford, PA-C  Anesthesia: 17 ccs of 1% lidocaine with epinephrine, intradermally  Details of Procedure: All risks, benefits, and alternatives to the above procedure were discussed with the patient. All questions answered and informed consent obtained. Patient's left forearm was prepped and draped in standard sterile fashion. 17 ccs of 1% lidocaine was injected intradermally and adequate anesthesia achieve. A 2 cm incision was made over the area of fluctuance and purulent drainage was expressed. Wound cultures were obtained. Overlaying liquefactive necrotic tissue was sharply debrided with scissors. Once all purulence expressed, 30 ccs of NS was irrigated throughout the wound bed. The wound was than packed with 1/2 inch iodoform gauze and covered with gauze and tape. The patient tolerated this well without immediate complicated. All sharps were accounted for and disposed of properly.   Findings: Liquefactive necrosis, purulent drainage  Complications: None apparent  EBL: Minimal, <5 ccs  -- Lynden Oxford, PA-C [facility] 02/09/2020, 10:15 AM [phone] M-F: 7am - 4pm  I was immediately available for and supervised the key portion of the procedure.

## 2020-02-09 NOTE — Plan of Care (Signed)

## 2020-02-09 NOTE — ED Provider Notes (Signed)
Daily  [facility] Emergency Department Provider Note  ____________________________________________   First MD Initiated Contact with Patient 02/09/20 [phone]     (approximate)  I have reviewed the triage vital signs and the nursing notes.   HISTORY  Chief Complaint Abscess   HPI Earl Moreno is a 54 y.o. 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  Patient states he last injected heroin yesterday.  He denies any EtOH use.  Denies any fevers, chills, cough, shortness of breath, abdominal pain, chest pain, nausea, vomiting, diarrhea, dysuria, recent traumatic injuries or any other rashes or lesions.  No prior similar episodes.  No clear alleviating aggravating factors.         No past medical history on file.  Patient Active Problem List   Diagnosis Date Noted  . New onset type 2 diabetes mellitus (HCC) 02/09/2020  . Hyperglycemia due to type 2 diabetes mellitus (HCC) 02/09/2020  . Hyperglycemia due to diabetes mellitus (HCC) 02/09/2020  . Cellulitis of left hand 06/02/2018    Past Surgical History:  Procedure Laterality Date  . I & D EXTREMITY Left 06/05/2018   Procedure: IRRIGATION AND DEBRIDEMENT EXTREMITY-LEFT HAND;  Surgeon: Lyndle HerrlichBowers, James R, MD;  Location: [facility] ORS;  Service: Orthopedics;  Laterality: Left;    Prior to Admission medications   Medication Sig Start Date End Date Taking? Authorizing Provider  aspirin 81 MG chewable tablet Chew 1 tablet (81 mg total) by mouth daily. 01/07/15   Derwood KaplanNanavati, Ankit, MD  ibuprofen (ADVIL,MOTRIN) 600 MG tablet Take 1 tablet (600 mg total) by mouth every 6 (six) hours as needed. 06/07/18    Auburn BilberryPatel, Shreyang, MD  methadone (DOLOPHINE) 10 MG/5ML solution Take 50 mg by mouth daily.     [provider]    Allergies Patient has no known allergies.  Family History  Family history unknown: Yes    Social History Social History   Tobacco Use  . Smoking status: Current Every Day Smoker    Packs/day: 0.50  . Smokeless tobacco: Never Used  Substance Use Topics  . Alcohol use: No  . Drug use: Yes    Types: Cocaine, IV    Comment: Heroin    Review of Systems  Review of Systems  Constitutional: Positive for malaise/fatigue. Negative for chills and fever.  HENT: Negative for sore throat.   Eyes: Positive for blurred vision. Negative for pain.  Respiratory: Negative for cough and stridor.   Cardiovascular: Negative for chest pain.  Gastrointestinal: Negative for vomiting.  Genitourinary: Positive for frequency. Negative for dysuria.  Musculoskeletal: Positive for myalgias ( L forearm, L wrist, and R wrist).  Skin: Negative for rash.  Neurological: Negative for seizures, loss of consciousness and headaches.  Endo/Heme/Allergies: Positive for polydipsia.  Psychiatric/Behavioral: Negative for suicidal ideas.  All other systems reviewed and are negative.     ____________________________________________   PHYSICAL EXAM:  VITAL SIGNS: ED Triage Vitals [02/08/20 2048]  Enc Vitals Group     BP (!) 134/102     Pulse Rate (!) 103     Resp 20     Temp 98.7 F (37.1 C)     Temp Source Oral     SpO2  97 %     Weight 130 lb (59 kg)     Height 5\' 8"  (1.727 m)     Head Circumference      Peak Flow      Pain Score 10     Pain Loc      Pain Edu?      Excl. in GC?    Vitals:   02/08/20 2048  BP: (!) 134/102  Pulse: (!) 103  Resp: 20  Temp: 98.7 F (37.1 C)  SpO2: 97%   Physical Exam Vitals and nursing note reviewed.  Constitutional:      Appearance: He is well-developed. He is ill-appearing.  HENT:     Head: Normocephalic and atraumatic.     Right  Ear: External ear normal.     Left Ear: External ear normal.     Nose: Nose normal.     Mouth/Throat:     Mouth: Mucous membranes are dry.  Eyes:     Conjunctiva/sclera: Conjunctivae normal.  Cardiovascular:     Rate and Rhythm: Regular rhythm. Tachycardia present.     Heart sounds: No murmur heard.   Pulmonary:     Effort: Pulmonary effort is normal. No respiratory distress.     Breath sounds: Normal breath sounds.  Abdominal:     Palpations: Abdomen is soft.     Tenderness: There is no abdominal tenderness.  Musculoskeletal:     Cervical back: Neck supple.     Right lower leg: No edema.     Left lower leg: No edema.  Skin:    General: Skin is warm and dry.     Capillary Refill: Capillary refill takes more than 3 seconds.     Findings: Abscess ( L proximal forearm, base of L thumb, R radial wrist. ) present.  Neurological:     General: No focal deficit present.     Mental Status: He is alert.     Sensation intact light distribution of the ulnar median nerves in bilateral hands.  2+ radial pulse bilaterally. ____________________________________________   LABS (all labs ordered are listed, but only abnormal results are displayed)  Labs Reviewed  COMPREHENSIVE METABOLIC PANEL - Abnormal; Notable for the following components:      Result Value   Sodium 124 (*)    Chloride 85 (*)    Glucose, Bld 828 (*)    Total Protein 8.5 (*)    Alkaline Phosphatase 128 (*)    All other components within normal limits  BETA-HYDROXYBUTYRIC ACID - Abnormal; Notable for the following components:   Beta-Hydroxybutyric Acid 1.83 (*)    All other components within normal limits  BASIC METABOLIC PANEL - Abnormal; Notable for the following components:   Sodium 132 (*)    Chloride 94 (*)    Glucose, Bld 519 (*)    All other components within normal limits  OSMOLALITY - Abnormal; Notable for the following components:   Osmolality 304 (*)    All other components within normal limits    GLUCOSE, CAPILLARY - Abnormal; Notable for the following components:   Glucose-Capillary 507 (*)    All other components within normal limits  SARS CORONAVIRUS 2 BY RT PCR (HOSPITAL ORDER, PERFORMED IN [facility])  CULTURE, BLOOD (ROUTINE X 2)  CULTURE, BLOOD (ROUTINE X 2)  AEROBIC CULTURE (SUPERFICIAL SPECIMEN)  CBC WITH DIFFERENTIAL/PLATELET  ETHANOL  URINALYSIS, COMPLETE (UACMP) WITH MICROSCOPIC  BLOOD GAS, VENOUS  URINE DRUG SCREEN, QUALITATIVE ([facility] ONLY)  HEPATITIS PANEL, ACUTE  HEMOGLOBIN  A1C  HIV ANTIBODY (ROUTINE TESTING W REFLEX)  CBC  CBG MONITORING, ED  CBG MONITORING, ED   ____________________________________________  ___________________________________  RADIOLOGY  ED MD interpretation: All plain films were reviewed by myself and taken into consideration during medical decision making.  Chest x-ray is unremarkable there is no evidence of fracture or retained foreign body on remainder of plain films.  Official radiology report(s): DG Chest 2 View  Result Date: 02/09/2020 CLINICAL DATA:  Tachycardia EXAM: CHEST - 2 VIEW COMPARISON:  01/06/2015 FINDINGS: The heart size and mediastinal contours are within normal limits. Both lungs are clear. The visualized skeletal structures are unremarkable. IMPRESSION: No active cardiopulmonary disease. Electronically Signed   By: Helyn Numbers MD   On: 02/09/2020 04:55   DG Forearm Left  Result Date: 02/09/2020 CLINICAL DATA:  Intravenous drug abuse.  Abscess EXAM: LEFT FOREARM - 2 VIEW COMPARISON:  02/10/2012 FINDINGS: Focal soft tissue swelling along the proximal ventral forearm. No soft tissue emphysema is seen. No metallic foreign body to suggest retained needle fragment. A vague high-density over this area is nonspecific. No bony erosion or elbow joint effusion. IMPRESSION: History of abscess with focal soft tissue swelling at the ventral and proximal forearm. No soft tissue gas, retained needle, or bony erosion.  Electronically Signed   By: Marnee Spring M.D.   On: 02/09/2020 04:55   DG Wrist Complete Left  Result Date: 02/09/2020 CLINICAL DATA:  Left hand abscess. EXAM: LEFT WRIST - COMPLETE 3+ VIEW COMPARISON:  None. FINDINGS: There is no evidence of fracture or dislocation. There is no evidence of arthropathy or other focal bone abnormality. Soft tissues are unremarkable. IMPRESSION: Negative. Electronically Signed   By: Marnee Spring M.D.   On: 02/09/2020 04:53   DG Wrist Complete Right  Result Date: 02/09/2020 CLINICAL DATA:  Left hand abscess.  IV drug abuse. EXAM: RIGHT WRIST - COMPLETE 3+ VIEW COMPARISON:  None. FINDINGS: There is no evidence of fracture or dislocation. Possible mild skin irregularity to the dorsal right hand. Negative for bony erosion or soft tissue gas. Ganglia/cystic type lucencies at the scaphoid and hamate, incidental. IMPRESSION: No acute osseous finding or soft tissue emphysema. Electronically Signed   By: Marnee Spring M.D.   On: 02/09/2020 04:53    ____________________________________________   PROCEDURES  Procedure(s) performed (including Critical Care):  .Critical Care Performed by: Gilles Chiquito, MD Authorized by: Gilles Chiquito, MD   Critical care provider statement:    Critical care time (minutes):  75   Critical care was necessary to treat or prevent imminent or life-threatening deterioration of the following conditions:  Endocrine crisis and dehydration   Critical care was time spent personally by me on the following activities:  Discussions with consultants, evaluation of patient's response to treatment, examination of patient, ordering and performing treatments and interventions, ordering and review of laboratory studies, ordering and review of radiographic studies, pulse oximetry, re-evaluation of patient's condition, obtaining history from patient or surrogate, review of old charts and development of treatment plan with patient or  surrogate .1-3 Lead EKG Interpretation Performed by: Gilles Chiquito, MD Authorized by: Gilles Chiquito, MD     Interpretation: abnormal     ECG rate assessment: tachycardic     Rhythm: sinus rhythm     Ectopy: none     Conduction: normal       ____________________________________________   INITIAL IMPRESSION / ASSESSMENT AND PLAN / ED COURSE        Patient presents  with left to history exam for assessment of several abscesses in the setting of over a month of worsening polydipsia, polyphagia, weight loss, fatigue, and blurry vision.  This is in the setting of IV drug use and no formal diagnosis of diabetes.  Patient is tachycardic and dry appearing on exam with otherwise stable vital signs.  Initial work-up concerning for HHS given patient is hyperglycemic with sugars greater than 800 with a bicarb of 26 and anion gap of 13.  Patient has an NA of 124 which is likely pseudohyponatremia in the setting of hyperglycemia.  No other significant metabolic derangements on CMP.  CBC unremarkable.  Patient does not appear septic but does appear significantly dehydrated.  He was started on IV fluids and insulin drip for HHS.  With regards to abscesses described above concern for possible bacteremia versus local inoculation from IV drug use.  Blood cultures as well as wound culture was obtained and IV antibiotics were started.  I did also send for HIV and hepatitis studies.  I will plan to admit to medicine service for further evaluation management.  ____________________________________________   FINAL CLINICAL IMPRESSION(S) / ED DIAGNOSES  Final diagnoses:  Abscess of multiple sites  Hyperglycemia  Polysubstance abuse (HCC)  Hyperosmolar hyperglycemic state (HHS) (HCC)    Medications  insulin regular, human (MYXREDLIN) 100 units/ 100 mL infusion (5.5 Units/hr Intravenous New Bag/Given 02/09/20 0550)  lactated ringers infusion (has no administration in time range)  dextrose 5 % in  lactated ringers infusion (has no administration in time range)  dextrose 50 % solution 0-50 mL (has no administration in time range)  potassium chloride 10 mEq in 100 mL IVPB (has no administration in time range)  vancomycin (VANCOCIN) IVPB 1000 mg/200 mL premix (has no administration in time range)  acetaminophen (TYLENOL) tablet 1,000 mg (has no administration in time range)  sodium chloride flush (NS) 0.9 % injection 10-40 mL (has no administration in time range)  sodium chloride flush (NS) 0.9 % injection 10-40 mL (has no administration in time range)  enoxaparin (LOVENOX) injection 40 mg (has no administration in time range)  acetaminophen (TYLENOL) tablet 650 mg (has no administration in time range)    Or  acetaminophen (TYLENOL) suppository 650 mg (has no administration in time range)  ondansetron (ZOFRAN) tablet 4 mg (has no administration in time range)    Or  ondansetron (ZOFRAN) injection 4 mg (has no administration in time range)  sodium chloride 0.9 % bolus 1,000 mL (0 mLs Intravenous Stopped 02/08/20 2330)  lactated ringers bolus 1,000 mL (1,000 mLs Intravenous New Bag/Given 02/09/20 0544)  Tdap (BOOSTRIX) injection 0.5 mL (0.5 mLs Intramuscular Given 02/09/20 0551)     ED Discharge Orders    None       Note:  This document was prepared using Dragon voice recognition software and may include unintentional dictation errors.   Gilles Chiquito, MD 02/09/20 [phone]

## 2020-02-09 NOTE — ED Notes (Signed)
Admitting MD Hendricks Limes regarding pt request for pain medicine at this time.

## 2020-02-09 NOTE — ED Notes (Signed)
Pt cussing and yelling wanting pain medication. Pt advised that we messaged MD and were waiting for any new orders. Pt also advised that it was not time for his morphine yet. FAmily at bedside and aware

## 2020-02-09 NOTE — ED Notes (Signed)
ED secretary to call admitting provider regarding pt pain and COWS score.

## 2020-02-09 NOTE — Progress Notes (Signed)
Inpatient Diabetes Program Recommendations  AACE/ADA: New Consensus Statement on Inpatient Glycemic Control   Target Ranges:  Prepandial:   less than 140 mg/dL      Peak postprandial:   less than 180 mg/dL (1-2 hours)      Critically ill patients:  140 - 180 mg/dL   Results for CLAYBURN, WEEKLY (MRN [id number]) as of 02/09/2020 09:56  Ref. Range 02/09/2020 05:48 02/09/2020 06:25 02/09/2020 06:54 02/09/2020 08:21  Glucose-Capillary Latest Ref Range: 70 - 99 mg/dL 507 (HH) 422 (H) 374 (H) 325 (H)  Results for MARINO, ROGERSON (MRN [id number]) as of 02/09/2020 09:56  Ref. Range 02/08/2020 21:29  Glucose Latest Ref Range: 70 - 99 mg/dL 828 (HH)   Review of Glycemic Control  Diabetes history: No Outpatient Diabetes medications: NA Current orders for Inpatient glycemic control: IV insulin  Inpatient Diabetes Program Recommendations:    Insulin at transition from IV to SQ: Once provider is ready to transition from IV to SQ insulin, please consider ordering Lantus 12 units Q24H (based on 59 kg x 0.2 units), CBGs Q4H, Novolog 0-9 units Q4H.  Labs: Please consider adding a c-peptide and GAD antibodies to labs to help determine type of diabetes.  NOTE: Noted consult for diabetes coordinator. Chart reviewed. Noted patient in Emergency Department with hyperglycemia and multiple abscesses. Initial glucose 828 mg/dl on 02/08/20 and patient started on IV insulin and is currently is still on IV insulin. Per chart, patient has family hx of DM. Ordered Living Well with DM, patient education by RNs, insulin starter kit, RD consult, and TOC consult for assistance with follow up and medications. Will plan to talk with patient today.  Addendum 02/09/20_0 :48-Patient still in Emergency Room this afternoon on IV insulin.  Spoke with patient about new diabetes diagnosis.  Patient reports he has never been told he had DM and has not seen a doctor in over 5 years. Patient states that his mother has DM and 2 half sisters have  DM as well.  Patient reports that he is unemployed and has filed for disability multiple times and been denied. Patient also states that he uses about 1 gm of heroin a day and notes that he gets "sick" if he does not use the heroin.  Patient was tearful and stated that he was able to go to a methadone clinic for 1 year and able to stop using for that year but when his wife's car messed up he was unable to get to the clnic everyday and start using heroin again.  Patient notes that he has lost over 50 pounds in the past couple of months and that he has been having symptoms of hyperglycemia (polyuria, polydipsia, increased tiredness) for about 4 weeks.  Discussed initial glucose 828 mg/dl on 02/08/20 and use of IV insulin to improve glucose gradually and help determine insulin needs.   Explained what an A1C is and informed patient that his current A1C in in process at this time. Discussed basic pathophysiology of DM Type 2, basic home care, importance of checking CBGs and maintaining good CBG control to prevent long-term and short-term complications. Reviewed glucose and A1C goals.   Discussed impact of nutrition, exercise, stress, sickness, and medications on diabetes control. Patient has Living Well with diabetes booklet at bedside but notes that he can not read but his wife can read and will read the information to him.  Informed patient that information discussed will be in the book as well and encouraged patient to have his  wife read through entire book with him.  Patient states that he would like his wife to be educated on DM as well because she will be helping him at home and will have him understand everything. Explained that at this time, IV insulin will be continued and he will be transitioned to SQ insulin when appropriate. Explained that he may need insulin as an outpatient. Patient notes that his wife is gone home to take care of grandchildren but will be back tomorrow morning.  Patient is concerned about  how he will afford to see providers for follow up and afford medications. Informed patient that TOC has been consulted and will assist with follow up and medication needs. Informed patient that diabetes coordinator will come back to talk with him and his wife in the morning to discuss DM and insulin further.  Patient verbalized understanding of information discussed and he states that he has no further questions at this time related to diabetes.   RNs to provide ongoing basic DM education at bedside with this patient and engage patient to actively check blood glucose and administer insulin injections.    Thanks, Barnie Alderman, RN, MSN, CDE Diabetes Coordinator Inpatient Diabetes Program [phone] (Team Pager from 8am to 5pm)

## 2020-02-09 NOTE — ED Notes (Signed)
Pt is alert and oriented and ambulatory to the room with complaint of abscesses.  Abscess noted on left hand, left forearm, right wrist, open and bleeding.

## 2020-02-09 NOTE — Consult Note (Addendum)
[facility] SURGICAL CONSULTATION NOTE (initial) - cpt: 65035   HISTORY OF PRESENT ILLNESS (HPI):  54 y.o. male presented to [facility] ED overnight for evaluation of abscess(es). Patient reported that he has noticed multiple abscess to his upper extremities including his left wrist, left forearm, and right wrist. These have waxed any waned over the last 3 weeks. He tried hydrogen peroxide at home for these. His bilateral wrist abscessed have improved but his left forearm abscess continues to worsen. It has started to drain spontaneously in the last 24 hours. 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 No previous trauma or injury to the area. No fever, chills. He has history of similar abscesses in the past and required surgical intervention for these. Also endorses intranasal cocaine use, which he last used 24 hours ago. Of note, he also complained of increasing fatigue, vision changes, polydipsia, and polyuria over the last several weeks to months. He does have a strong FHx of diabetes but was never diagnosed himself. Otherwise, no complaints of shortness of breath, abdominal pain, chest pain, nausea, vomiting, diarrhea. Work up in the ED was concerning for significant hyperglycemia to 828 without acidosis, hyponatremia to 124 in setting of hyperglycemia, Beta-hydroxybutyric acid was elevated at 1.83, and his serum osmolarity was high at 304. He was without leukocytosis. BCx obtained are without growth <12 hours. XRs of his left wrist, forearm, and right wrist were negative for subcutaneous emphysema, retained foreign bodies, or osteomyelitis. He was admitted to the medicine service and started on PO doxycycline.   Surgery is consulted by hospitlaist physician Dr. Lindajo Royal, MD in this context for evaluation and management of multiple upper extremity abscesses in the setting of IVDA and HHS.   PAST MEDICAL HISTORY (PMH):  No past medical history  on file.   PAST SURGICAL HISTORY [facility]):  Past Surgical History:  Procedure Laterality Date   I & D EXTREMITY Left 06/05/2018   Procedure: IRRIGATION AND DEBRIDEMENT EXTREMITY-LEFT HAND;  Surgeon: Lyndle Herrlich, MD;  Location: [facility] ORS;  Service: Orthopedics;  Laterality: Left;     MEDICATIONS:  Prior to Admission medications   Not on File     ALLERGIES:  No Known Allergies   SOCIAL HISTORY:  Social History   Socioeconomic History   Marital status: Single    Spouse name: Not on file   Number of children: Not on file   Years of education: Not on file   Highest education level: Not on file  Occupational History   Not on file  Tobacco Use   Smoking status: Current Every Day Smoker    Packs/day: 0.50   Smokeless tobacco: Never Used  Substance and Sexual Activity   Alcohol use: No   Drug use: Yes    Types: Cocaine, IV    Comment: Heroin   Sexual activity: Not on file    Comment: occasionally  Other Topics Concern   Not on file  Social History Narrative   Not on file   Social Determinants of Health   Financial Resource Strain:    Difficulty of Paying Living Expenses: Not on file  Food Insecurity:    Worried About Running Out of Food in the Last Year: Not on file   Ran Out of Food in the Last Year: Not on file  Transportation Needs:    Lack of Transportation (Medical): Not on file   Lack of Transportation (Non-Medical): Not on file  Physical Activity:  Days of Exercise per Week: Not on file   Minutes of Exercise per Session: Not on file  Stress:    Feeling of Stress : Not on file  Social Connections:    Frequency of Communication with Friends and Family: Not on file   Frequency of Social Gatherings with Friends and Family: Not on file   Attends Religious Services: Not on file   Active Member of Clubs or Organizations: Not on file   Attends Banker Meetings: Not on file   Marital Status: Not on file  Intimate Partner Violence:    Fear of  Current or Ex-Partner: Not on file   Emotionally Abused: Not on file   Physically Abused: Not on file   Sexually Abused: Not on file     FAMILY HISTORY:  Family History  Family history unknown: Yes      REVIEW OF SYSTEMS:  Review of Systems  Constitutional: Positive for malaise/fatigue and weight loss. Negative for chills and fever.  HENT:       + vision Changes  Respiratory: Negative for cough and shortness of breath.   Cardiovascular: Negative for chest pain and palpitations.  Gastrointestinal: Negative for abdominal pain, nausea and vomiting.  Genitourinary: Positive for frequency. Negative for dysuria and hematuria.  Skin:       + Abscesses   Endo/Heme/Allergies: Positive for polydipsia.  All other systems reviewed and are negative.   VITAL SIGNS:  Temp:  [98.7 F (37.1 C)] 98.7 F (37.1 C) (09/19 2048) Pulse Rate:  [66-103] 73 (09/20 0745) Resp:  [15-20] 15 (09/20 0745) BP: (131-140)/(92-102) 140/100 (09/20 0730) SpO2:  [97 %-100 %] 100 % (09/20 0745) Weight:  [59 kg] 59 kg (09/19 2048)     Height: 5\' 8"  (172.7 cm) Weight: 59 kg BMI (Calculated): 19.77   INTAKE/OUTPUT:  09/19 0701 - 09/20 0700 In: 1015.7 [IV Piggyback:1015.7] Out: -   PHYSICAL EXAM:  Physical Exam Vitals and nursing note reviewed. Exam conducted with a chaperone present.  Constitutional:      General: He is not in acute distress.    Appearance: Normal appearance. He is not ill-appearing.  HENT:     Head: Normocephalic and atraumatic.     Mouth/Throat:     Comments: Poor Dentition Eyes:     General: No scleral icterus.    Conjunctiva/sclera: Conjunctivae normal.  Cardiovascular:     Rate and Rhythm: Normal rate and regular rhythm.     Pulses: Normal pulses.     Heart sounds: No murmur heard.   Pulmonary:     Effort: Pulmonary effort is normal. No respiratory distress.  Abdominal:     General: Abdomen is flat.     Palpations: Abdomen is soft.     Tenderness: There is no abdominal  tenderness. There is no guarding or rebound.  Genitourinary:    Comments: Deferred Musculoskeletal:     Right lower leg: No edema.     Left lower leg: No edema.  Skin:    General: Skin is warm and dry.     Findings: Abscess and erythema present.       Neurological:     General: No focal deficit present.     Mental Status: He is alert and oriented to person, place, and time.  Psychiatric:        Mood and Affect: Mood normal.        Behavior: Behavior normal.      Labs:  CBC Latest Ref Rng & Units 02/08/2020  06/07/2018 06/06/2018  WBC 4.0 - 10.5 K/uL 9.4 8.3 12.0(H)  Hemoglobin 13.0 - 17.0 g/dL 74.1 11.5(L) 11.5(L)  Hematocrit 39 - 52 % 43.7 36.3(L) 34.8(L)  Platelets 150 - 400 K/uL 288 231 242   CMP Latest Ref Rng & Units 02/09/2020 02/08/2020 06/07/2018  Glucose 70 - 99 mg/dL 287(OM) 767(MC) 947(S)  BUN 6 - 20 mg/dL 9 10 9   Creatinine 0.61 - 1.24 mg/dL 9.62 8.36  Sodium 135 - 145 mmol/L 132(L) 124(L) 142  Potassium 3.5 - 5.1 mmol/L 3.8 4.2 3.5  Chloride 98 - 111 mmol/L 94(L) 85(L) 108  CO2 22 - 32 mmol/L 26 26 27   Calcium 8.9 - 10.3 mg/dL 8.9 9.3 6.29)  Total Protein 6.5 - 8.1 g/dL - 8.5(H) -  Total Bilirubin 0.3 - 1.2 mg/dL - 0.9 -  Alkaline Phos 38 - 126 U/L - 128(H) -  AST 15 - 41 U/L - 21 -  ALT 0 - 44 U/L - 23 -    Imaging studies:   XR Left Forearm (02/09/2020) personally reviewed without subcutaneous air nor foreign body, and radiologist report reviewed:  IMPRESSION: History of abscess with focal soft tissue swelling at the ventral and proximal forearm. No soft tissue gas, retained needle, or bony erosion.   XR Left Wrist (02/09/2020) personally reviewed and unremarkable, and radiologist report reviewed:  IMPRESSION: Negative.   XR Right wrist (02/09/2020) personally reviewed and unremarkable, and radiologist report reviewed:  IMPRESSION: No acute osseous finding or soft tissue emphysema.   Assessment/Plan: (ICD-10's: L30.91) 54 y.o. male with left  mid-forearm abscess and two areas or induration without abscess to the bilateral thumb/wrist secondary to IVDA, complicated by pertinent comorbidities including IVDA and HSS in the setting of undiagnosed DM.   - Given his recent cocaine use and the fact that he ate this morning, he would be unable to go to the OR for a few days. I have offered him bedside I&D of his left forearm abscess vs observation, and he and his wife have elected for bedside I&D. All risks, benefits, and alternatives to above procedure(s) were discussed with the patient and his wife, all of their questions were answered to their expressed satisfaction, patient expresses he wishes to proceed, and informed consent was obtained. Please refer to procedure note for complete documentation.    - Okay to continue diet  - Continue Abx (Doxycycline); I will obtain cultures with I&D  - Pain control prn; I explained difficultly with pain control secondary to his chronic heroin use  - Monitor indurated areas on bilateral thumb/wrist; no intervention currently  - Further management per primary service   All of the above findings and recommendations were discussed with the patient and his wife at bedside, and all of their questions were answered to their expressed satisfaction.  Thank you for the opportunity to participate in this patient's care.   -- L12.98, PA-C [facility] 02/09/2020, 9:29 AM [phone] M-F: 7am - 4pm  I saw and evaluated the patient.  I agree with the above documentation, exam, and plan, which I have edited where appropriate. 02/11/2020  10:49 AM

## 2020-02-10 DIAGNOSIS — R739 Hyperglycemia, unspecified: Secondary | ICD-10-CM

## 2020-02-10 LAB — BASIC METABOLIC PANEL
Anion gap: 12 (ref 5–15)
BUN: 11 mg/dL (ref 6–20)
CO2: 24 mmol/L (ref 22–32)
Calcium: 8.8 mg/dL — ABNORMAL LOW (ref 8.9–10.3)
Chloride: 98 mmol/L (ref 98–111)
Creatinine, Ser: 0.55 mg/dL — ABNORMAL LOW (ref 0.61–1.24)
GFR calc Af Amer: >60 mL/min (ref >60)
GFR calc non Af Amer: >60 mL/min (ref >60)
Glucose, Bld: 279 mg/dL — ABNORMAL HIGH (ref 70–99)
Potassium: 3.2 mmol/L — ABNORMAL LOW (ref 3.5–5.1)
Sodium: 134 mmol/L — ABNORMAL LOW (ref 135–145)

## 2020-02-10 LAB — GLUCOSE, CAPILLARY
Glucose-Capillary: 260 mg/dL — ABNORMAL HIGH (ref 70–99)
Glucose-Capillary: 234 mg/dL — ABNORMAL HIGH (ref 70–99)
Glucose-Capillary: 296 mg/dL — ABNORMAL HIGH (ref 70–99)
Glucose-Capillary: 313 mg/dL — ABNORMAL HIGH (ref 70–99)

## 2020-02-10 LAB — HEMOGLOBIN A1C
Hgb A1c MFr Bld: >15.5 % — ABNORMAL HIGH (ref 4.8–5.6)
Mean Plasma Glucose: >398 mg/dL

## 2020-02-10 LAB — PROCALCITONIN: Procalcitonin: 0.1 ng/mL

## 2020-02-10 LAB — LACTIC ACID, PLASMA: Lactic Acid, Venous: 2.7 mmol/L (ref 0.5–1.9)

## 2020-02-10 MED ORDER — INSULIN STARTER KIT- PEN NEEDLES (ENGLISH)
1.0000 | Freq: Once | Status: AC
  Administered 2020-02-11: 1
  Filled 2020-02-10: qty 1

## 2020-02-10 MED ORDER — SODIUM CHLORIDE 0.9 % IV SOLN
2.0000 g | Freq: Three times a day (TID) | INTRAVENOUS | Status: DC
  Administered 2020-02-10 – 2020-02-12 (×8): 2 g via INTRAVENOUS
  Filled 2020-02-10 (×10): qty 2

## 2020-02-10 MED ORDER — HALOPERIDOL 0.5 MG PO TABS
0.5000 mg | ORAL_TABLET | Freq: Three times a day (TID) | ORAL | Status: DC
  Filled 2020-02-10 (×2): qty 1

## 2020-02-10 MED ORDER — HALOPERIDOL LACTATE 5 MG/ML IJ SOLN
0.5000 mg | Freq: Three times a day (TID) | INTRAMUSCULAR | Status: DC
  Filled 2020-02-10 (×2): qty 1

## 2020-02-10 MED ORDER — SODIUM CHLORIDE 0.9 % IV SOLN
INTRAVENOUS | Status: DC | PRN
  Administered 2020-02-10 (×2): 500 mL via INTRAVENOUS
  Administered 2020-02-11: 250 mL via INTRAVENOUS

## 2020-02-10 MED ORDER — INSULIN STARTER KIT- PEN NEEDLES (ENGLISH): 1.0000 | Freq: Once | 0 refills | Status: DC

## 2020-02-10 MED ORDER — METFORMIN HCL 500 MG PO TABS
1000.0000 mg | ORAL_TABLET | Freq: Two times a day (BID) | ORAL | Status: DC
  Administered 2020-02-10 – 2020-02-11 (×3): 1000 mg via ORAL
  Filled 2020-02-10 (×3): qty 2

## 2020-02-10 MED ORDER — DOXYCYCLINE HYCLATE 50 MG PO CAPS: 100.0000 mg | ORAL_CAPSULE | Freq: Two times a day (BID) | ORAL | 0 refills | Status: DC

## 2020-02-10 MED ORDER — ADULT MULTIVITAMIN W/MINERALS CH
1.0000 | ORAL_TABLET | Freq: Every day | ORAL | Status: DC
  Administered 2020-02-11 – 2020-02-14 (×4): 1 via ORAL
  Filled 2020-02-10 (×3): qty 1

## 2020-02-10 MED ORDER — ENSURE MAX PROTEIN PO LIQD
11.0000 [oz_av] | Freq: Two times a day (BID) | ORAL | Status: DC
  Administered 2020-02-10 – 2020-02-14 (×8): 11 [oz_av] via ORAL
  Filled 2020-02-10: qty 330

## 2020-02-10 MED ORDER — GABAPENTIN 600 MG PO TABS
600.0000 mg | ORAL_TABLET | Freq: Three times a day (TID) | ORAL | Status: DC
  Administered 2020-02-10 – 2020-02-14 (×12): 600 mg via ORAL
  Filled 2020-02-10 (×14): qty 1

## 2020-02-10 MED ORDER — METFORMIN HCL 1000 MG PO TABS: 1000.0000 mg | ORAL_TABLET | Freq: Two times a day (BID) | ORAL | 5 refills | Status: DC

## 2020-02-10 MED ORDER — RISAQUAD PO CAPS
2.0000 | ORAL_CAPSULE | Freq: Three times a day (TID) | ORAL | Status: DC
  Administered 2020-02-10 – 2020-02-14 (×13): 2 via ORAL
  Filled 2020-02-10 (×15): qty 2

## 2020-02-10 MED ORDER — ALPRAZOLAM 0.5 MG PO TABS
1.0000 mg | ORAL_TABLET | Freq: Three times a day (TID) | ORAL | Status: DC | PRN
  Administered 2020-02-10 – 2020-02-13 (×6): 1 mg via ORAL
  Filled 2020-02-10 (×8): qty 2

## 2020-02-10 MED ORDER — INSULIN DETEMIR 100 UNIT/ML ~~LOC~~ SOLN: 20.0000 [IU] | Freq: Every day | SUBCUTANEOUS | 11 refills | Status: DC

## 2020-02-10 MED ORDER — HALOPERIDOL 0.5 MG PO TABS
0.5000 mg | ORAL_TABLET | Freq: Three times a day (TID) | ORAL | Status: DC
  Administered 2020-02-10 – 2020-02-14 (×12): 0.5 mg via ORAL
  Filled 2020-02-10 (×14): qty 1

## 2020-02-10 MED ORDER — MORPHINE SULFATE (PF) 2 MG/ML IV SOLN
1.0000 mg | INTRAVENOUS | Status: DC | PRN
  Administered 2020-02-10 – 2020-02-14 (×19): 1 mg via INTRAVENOUS
  Filled 2020-02-10 (×19): qty 1

## 2020-02-10 NOTE — Discharge Summary (Signed)
Physician Discharge Summary [facility]    Patient: Earl Moreno                   Admit date: 02/09/2020   DOB: [birth year]             Discharge date:02/10/2020/3:17 PM QMV:[id number]                          PCP: Patient, No Pcp Per  Disposition: Patient leaving AMA Diabetic medication including Metformin call Levemir doxycycline -was sent to pharmacy.  Recommendations for Outpatient Follow-up:   . Follow up: in 1 week  Discharge Condition: Stable   Code Status:   Code Status: Full Code  Diet recommendation: Diabetic diet   Discharge Diagnoses:    Principal Problem:   Hyperglycemia due to type 2 diabetes mellitus ([place]) Active Problems:   New onset type 2 diabetes mellitus ([place])   Hyperglycemia due to diabetes mellitus ([place])   Abscess   Heroin addiction ([place])   Hyponatremia   Essential hypertension   History of Present Illness/ Hospital Course Earl Moreno Summary:     HPI: Earl Remo Danielsis a 54 y.o.malewith medical history significant forIV drug use cocaine/heroin (patient admits to being a heroin addict)and uses intranasal cocaine as well.  Injects through his arms, left and right wrist, left forearm legs--multiple abscess He has had these abscesses for about a week and the one on his left forearm appears to be draining.   Increasing thirst, urinary frequency and worsening fatigue, weight loss. Patient has a strong family history of diabetes mellitus His last IV drug use was 1 day prior to his admission.  Blood sugars on admission greater than 800, Labs show sodium 124, potassium 4.2, chloride 85, bicarb 26, serum glucose 828, BUN 9, creatinine 0.79, calcium 9.3, albumin 3.7, AST 21, ALT 23, white count 9.4, hemoglobin 15.6, hematocrit 43.7, MCV 84.7, RDW 12.5, platelet count 288 Chest x-ray reviewed by me shows no infiltrate or effusion Left forearm x-ray showsno soft tissue gas, retained needle, or bony erosion. Twelve-lead EKG reviewed by me  shows sinus tachycardia    Admitted for newly diabetes mellitus type 2, hyperglycemia nonketotic, multiple skin abscess, IV drug use.  Assessment & Plan:    Hyperglycemia due to type 2 diabetes mellitus NEW onset diabetes mellitus type 2 Anion gap closed at 12, closed, DKA ruled out  Patient appears to have new onset diabetes mellitus and presents with significant weight loss, increased thirst, urinary frequency and fatigue  Initialblood sugar was greater than 800g/dl CBG improving 519, 260 this a.m.  Continue aggressive IV fluid hydration Continue insulin drip >>> improving anticipating discontinuing drip -Initiating long-acting insulin 20 units nightly, SSI coverage -We will initiate Metformin -A1c:  Patient will require diabetic education and nutrition evaluation   Bacteremia / multiple abscesses/  SIRS/sepsis ruled out -1 out of 2 blood cultures growing entire factor -Patient is initiated on IV antibiotics of cefepime -Doxycycline for possible MRSA coverage -Repeating blood cultures in the morning of 02/11/2020  Patient with skin/soft tissue infection involving both wrists and left forearm General surgery consult following, continue wound care, no plan for I&D at this time Continue local wound care daily + prn; Pack with iodoform gauze, cover, secure with tape-->   Hypertension - Uncontrolled We will start patient on amlodipine 10 mg daily -Monitoring very closely, titrating meds, as needed hydralazine   Hyponatremia -Transient secondary to significant hyperglycemia -Monitoring sodium level closely,  improving with IV fluid hydration and correction of hyperglycemia Secondary to significant hyperglycemia    Polysubstance dependence -Patient is urine drug screen positive for heroine/cocaine/opiates Patient has been counseled on the need to abstain from illicit drug use  Hepatitis  C Monitoring LFTs Hepatitis C reactive, quantitive RNA  hepatitis C ordered Likely need to follow-up as an outpatient with hematologist    Family Communication: his wife Disposition Plan:Likely home patient starting to leave [place] It was recommended to stay to continue current care as above. Consults called:Surgery    Nutritional status:          Discharge Instructions:   Discharge Instructions    Activity as tolerated - No restrictions   Complete by: As directed    Diet - low sodium heart healthy   Complete by: As directed    Increase activity slowly   Complete by: As directed        Medication List    TAKE these medications   doxycycline 50 MG capsule Commonly known as: VIBRAMYCIN Take 2 capsules (100 mg total) by mouth 2 (two) times daily for 14 days.   insulin detemir 100 UNIT/ML injection Commonly known as: LEVEMIR Inject 0.2 mLs (20 Units total) into the skin at bedtime.   insulin starter kit- pen needles Misc 1 kit by Other route once for 1 dose.   insulin starter kit- pen needles Misc 1 kit by Other route once for 1 dose.   metFORMIN 1000 MG tablet Commonly known as: GLUCOPHAGE Take 1 tablet (1,000 mg total) by mouth 2 (two) times daily with a meal.       No Known Allergies   Procedures /Studies:   DG Chest 2 View  Result Date: 02/09/2020 CLINICAL DATA:  Tachycardia EXAM: CHEST - 2 VIEW COMPARISON:  01/06/2015 FINDINGS: The heart size and mediastinal contours are within normal limits. Both lungs are clear. The visualized skeletal structures are unremarkable. IMPRESSION: No active cardiopulmonary disease. Electronically Signed   By: Fidela Salisbury MD   On: 02/09/2020 04:55   DG Forearm Left  Result Date: 02/09/2020 CLINICAL DATA:  Intravenous drug abuse.  Abscess EXAM: LEFT FOREARM - 2 VIEW COMPARISON:  02/10/2012 FINDINGS: Focal soft tissue swelling along the proximal ventral forearm. No soft tissue emphysema is seen. No metallic foreign body to suggest retained needle  fragment. A vague high-density over this area is nonspecific. No bony erosion or elbow joint effusion. IMPRESSION: History of abscess with focal soft tissue swelling at the ventral and proximal forearm. No soft tissue gas, retained needle, or bony erosion. Electronically Signed   By: Monte Fantasia M.D.   On: 02/09/2020 04:55   DG Wrist Complete Left  Result Date: 02/09/2020 CLINICAL DATA:  Left hand abscess. EXAM: LEFT WRIST - COMPLETE 3+ VIEW COMPARISON:  None. FINDINGS: There is no evidence of fracture or dislocation. There is no evidence of arthropathy or other focal bone abnormality. Soft tissues are unremarkable. IMPRESSION: Negative. Electronically Signed   By: Monte Fantasia M.D.   On: 02/09/2020 04:53   DG Wrist Complete Right  Result Date: 02/09/2020 CLINICAL DATA:  Left hand abscess.  IV drug abuse. EXAM: RIGHT WRIST - COMPLETE 3+ VIEW COMPARISON:  None. FINDINGS: There is no evidence of fracture or dislocation. Possible mild skin irregularity to the dorsal right hand. Negative for bony erosion or soft tissue gas. Ganglia/cystic type lucencies at the scaphoid and hamate, incidental. IMPRESSION: No acute osseous finding or soft tissue emphysema. Electronically Signed  By: Monte Fantasia M.D.   On: 02/09/2020 04:53     Subjective:   Patient was seen and examined 02/10/2020, 3:17 PM Patient stable today. No acute distress.  No issues overnight Stable for discharge.  Discharge Exam:    Vitals:   02/09/20 2026 02/10/20 0024 02/10/20 0349 02/10/20 0834  BP:  (!) 151/100 130/72 (!) 145/94  Pulse:  (!) 59 (!) 55 62  Resp:  _0 Temp:  98.1 F (36.7 C) 98.8 F (37.1 C) 98.2 F (36.8 C)  TempSrc:  Oral  Oral  SpO2:  99% 98% 100%  Weight: 61.1 kg     Height:        General: Pt lying comfortably in bed & appears in no obvious distress. Cardiovascular: S1 & S2 heard, RRR, S1/S2 +. No murmurs, rubs, gallops or clicks. No JVD or pedal edema. Respiratory: Clear to  auscultation without wheezing, rhonchi or crackles. No increased work of breathing. Abdominal:  Non-distended, non-tender & soft. No organomegaly or masses appreciated. Normal bowel sounds heard. CNS: Alert and oriented. No focal deficits. Extremities: no edema, no cyanosis    The results of significant diagnostics from this hospitalization (including imaging, microbiology, ancillary and laboratory) are listed below for reference.      Microbiology:   Recent Results (from the past 240 hour(s))  SARS Coronavirus 2 by RT PCR (hospital order, performed in [facility] hospital lab) Nasopharyngeal Nasopharyngeal Swab     Status: None   Collection Time: 02/09/20  3:45 AM   Specimen: Nasopharyngeal Swab  Result Value Ref Range Status   SARS Coronavirus 2 NEGATIVE NEGATIVE Final    Comment: (NOTE) SARS-CoV-2 target nucleic acids are NOT DETECTED.  The SARS-CoV-2 RNA is generally detectable in upper and lower respiratory specimens during the acute phase of infection. The lowest concentration of SARS-CoV-2 viral copies this assay can detect is 250 copies / mL. A negative result does not preclude SARS-CoV-2 infection and should not be used as the sole basis for treatment or other patient management decisions.  A negative result may occur with improper specimen collection / handling, submission of specimen other than nasopharyngeal swab, presence of viral mutation(s) within the areas targeted by this assay, and inadequate number of viral copies (<250 copies / mL). A negative result must be combined with clinical observations, patient history, and epidemiological information.  Fact Sheet for Patients:   StrictlyIdeas.no  Fact Sheet for Healthcare Providers: BankingDealers.co.za  This test is not yet approved or  cleared by the [place] FDA and has been authorized for detection and/or diagnosis of SARS-CoV-2 by FDA under an Emergency Use  Authorization (EUA).  This EUA will remain in effect (meaning this test can be used) for the duration of the COVID-19 declaration under Section 564(b)(1) of the Act, 21 U.S.C. section 360bbb-3(b)(1), unless the authorization is terminated or revoked sooner.  Performed at [facility], [address] [place], [place] [address]   Blood culture (routine x 2)     Status: None (Preliminary result)   Collection Time: 02/09/20  3:55 AM   Specimen: BLOOD  Result Value Ref Range Status   Specimen Description BLOOD RIGHT ARM  Final   Special Requests   Final    BOTTLES DRAWN AEROBIC AND ANAEROBIC Blood Culture results may not be optimal due to an inadequate volume of blood received in culture bottles   Culture  Setup Time   Final    GRAM NEGATIVE RODS ANAEROBIC BOTTLE ONLY Organism  ID to follow CRITICAL RESULT CALLED TO, READ BACK BY AND VERIFIED WITHVioleta Gelinas @ [phone] 02/10/20 RH Performed at [facility], [address] [place], [place] [address]    Culture GRAM NEGATIVE RODS  Final   Report Status PENDING  Incomplete  Wound or Superficial Culture     Status: None (Preliminary result)   Collection Time: 02/09/20  3:55 AM   Specimen: Arm; Wound  Result Value Ref Range Status   Specimen Description   Final    ARM LEFT FOREARM Performed at [facility], [address], [place], [place] [address]    Special Requests   Final    NONE Performed at [facility], [address] [place], [place] [address]    Gram Stain   Final    FEW WBC PRESENT, PREDOMINANTLY PMN RARE GRAM POSITIVE RODS RARE GRAM POSITIVE COCCI Performed at [facility], [place] [address], [place], [place] [address]    Culture FEW SERRATIA MARCESCENS  Final   Report Status PENDING  Incomplete  Blood Culture ID Panel (Reflexed)     Status: Abnormal   Collection Time: 02/09/20  3:55 AM  Result Value Ref Range Status   Enterococcus faecalis NOT DETECTED NOT DETECTED Final     Enterococcus Faecium NOT DETECTED NOT DETECTED Final   Listeria monocytogenes NOT DETECTED NOT DETECTED Final   Staphylococcus species NOT DETECTED NOT DETECTED Final   Staphylococcus aureus (BCID) NOT DETECTED NOT DETECTED Final   Staphylococcus epidermidis NOT DETECTED NOT DETECTED Final   Staphylococcus lugdunensis NOT DETECTED NOT DETECTED Final   Streptococcus species NOT DETECTED NOT DETECTED Final   Streptococcus agalactiae NOT DETECTED NOT DETECTED Final   Streptococcus pneumoniae NOT DETECTED NOT DETECTED Final   Streptococcus pyogenes NOT DETECTED NOT DETECTED Final   A.calcoaceticus-baumannii NOT DETECTED NOT DETECTED Final   Bacteroides fragilis NOT DETECTED NOT DETECTED Final   Enterobacterales DETECTED (A) NOT DETECTED Final    Comment: Enterobacterales represent a large order of gram negative bacteria, not a single organism.   Enterobacter cloacae complex NOT DETECTED NOT DETECTED Final   Escherichia coli NOT DETECTED NOT DETECTED Final   Klebsiella aerogenes NOT DETECTED NOT DETECTED Final   Klebsiella oxytoca NOT DETECTED NOT DETECTED Final   Klebsiella pneumoniae NOT DETECTED NOT DETECTED Final   Proteus species NOT DETECTED NOT DETECTED Final   Salmonella species NOT DETECTED NOT DETECTED Final   Serratia marcescens DETECTED (A) NOT DETECTED Final    Comment: CRITICAL RESULT CALLED TO, READ BACK BY AND VERIFIED WITH: JASON ROBBINS @ 0814 02/10/2020 RH    Haemophilus influenzae NOT DETECTED NOT DETECTED Final   Neisseria meningitidis NOT DETECTED NOT DETECTED Final   Pseudomonas aeruginosa NOT DETECTED NOT DETECTED Final   Stenotrophomonas maltophilia NOT DETECTED NOT DETECTED Final   Candida albicans NOT DETECTED NOT DETECTED Final   Candida auris NOT DETECTED NOT DETECTED Final   Candida glabrata NOT DETECTED NOT DETECTED Final   Candida krusei NOT DETECTED NOT DETECTED Final   Candida parapsilosis NOT DETECTED NOT DETECTED Final   Candida tropicalis NOT  DETECTED NOT DETECTED Final   Cryptococcus neoformans/gattii NOT DETECTED NOT DETECTED Final   CTX-M ESBL NOT DETECTED NOT DETECTED Final   Carbapenem resistance IMP NOT DETECTED NOT DETECTED Final   Carbapenem resistance KPC NOT DETECTED NOT DETECTED Final   Carbapenem resistance NDM NOT DETECTED NOT DETECTED Final   Carbapenem resist OXA 48 LIKE NOT DETECTED NOT DETECTED Final   Carbapenem resistance  VIM NOT DETECTED NOT DETECTED Final    Comment: Performed at [facility], [address] [place], [place] [address]  Aerobic/Anaerobic Culture (surgical/deep wound)     Status: None (Preliminary result)   Collection Time: 02/09/20 10:48 AM   Specimen: Abscess  Result Value Ref Range Status   Specimen Description ABSCESS LEFT ARM  Final   Special Requests NONE  Final   Gram Stain   Final    MODERATE WBC PRESENT,BOTH PMN AND MONONUCLEAR MODERATE GRAM POSITIVE RODS RARE GRAM NEGATIVE RODS RARE YEAST    Culture   Final    FEW GRAM NEGATIVE RODS IDENTIFICATION AND SUSCEPTIBILITIES TO FOLLOW Performed at [facility], [place] [address], [place], [place] [address]    Report Status PENDING  Incomplete     Labs:   CBC: Recent Labs  Lab 02/08/20 2129  WBC 9.4  NEUTROABS 6.5  HGB 15.6  HCT 43.7  MCV 84.7  PLT 333   Basic Metabolic Panel: Recent Labs  Lab 02/08/20 2129 02/09/20 0345 02/10/20 1324  NA 124* 132* 134*  K 4.2 3.8 3.2*  CL 85* 94* 98  CO2 _0 GLUCOSE 828* 519* 279*  BUN _1 CREATININE 0.79 0.62 0.55*  CALCIUM 9.3 8.9 8.8*   Liver Function Tests: Recent Labs  Lab 02/08/20 2129  AST 21  ALT 23  ALKPHOS 128*  BILITOT 0.9  PROT 8.5*  ALBUMIN 3.7   BNP (last 3 results) No results for input(s): BNP in the last 8760 hours. Cardiac Enzymes: No results for input(s): CKTOTAL, CKMB, CKMBINDEX, TROPONINI in the last 168 hours. CBG: Recent Labs  Lab 02/09/20 1615 02/09/20 1716 02/09/20 2029 02/10/20 0758 02/10/20 1152  GLUCAP  278* 192* 417* 260* 234*   Hgb A1c Recent Labs    02/09/20 0355  HGBA1C >15.5*   Lipid Profile No results for input(s): CHOL, HDL, LDLCALC, TRIG, CHOLHDL, LDLDIRECT in the last 72 hours. Thyroid function studies No results for input(s): TSH, T4TOTAL, T3FREE, THYROIDAB in the last 72 hours.  Invalid input(s): FREET3 Anemia work up No results for input(s): VITAMINB12, FOLATE, FERRITIN, TIBC, IRON, RETICCTPCT in the last 72 hours. Urinalysis    Component Value Date/Time   COLORURINE YELLOW (A) 02/09/2020 1153   APPEARANCEUR HAZY (A) 02/09/2020 1153   APPEARANCEUR Cloudy 12/30/2012 1018   LABSPEC 1.031 (H) 02/09/2020 1153   LABSPEC 1.017 12/30/2012 1018   PHURINE 8.0 02/09/2020 1153   GLUCOSEU >=500 (A) 02/09/2020 1153   GLUCOSEU Negative 12/30/2012 1018   HGBUR NEGATIVE 02/09/2020 1153   BILIRUBINUR NEGATIVE 02/09/2020 1153   BILIRUBINUR Negative 12/30/2012 1018   KETONESUR NEGATIVE 02/09/2020 1153   PROTEINUR NEGATIVE 02/09/2020 1153   NITRITE NEGATIVE 02/09/2020 1153   LEUKOCYTESUR MODERATE (A) 02/09/2020 1153   LEUKOCYTESUR Negative 12/30/2012 1018         Time coordinating discharge: Over 45 minutes  SIGNED: Deatra James, MD, FACP, FHM. [facility],  Please use [email or web address] to Page If 7PM-7AM, please contact night-coverage [email or web address] [email or web address] [facility] 02/10/2020, 3:17 PM

## 2020-02-10 NOTE — Progress Notes (Addendum)
[facility] SURGICAL PROGRESS NOTE  Hospital Day(s): 1.   Post op day(s): 1 day s/p bedside I&D   Interval History:  Patient seen and examined no acute events or new complaints overnight.  Patient reports he continues to have pain in his left forearm at I&D site, he is adamant that the dilaudid does not help his pain and he needs more.  No fever and chills No new labs this morning Initial BCx growing enterobacterales and Serratia marcescens  Wound Cx from 09/20 growing gram positive rods, gram negative rods Antibiotics switched to Cefepime early this morning   Vital signs in last 24 hours: [min-max] current  Temp:  [98 F (36.7 C)-98.8 F (37.1 C)] 98.8 F (37.1 C) (09/21 0349) Pulse Rate:  [55-73] 55 (09/21 0349) Resp:  [13-21] 14 (09/21 0349) BP: (127-161)/(70-100) 130/72 (09/21 0349) SpO2:  [97 %-100 %] 98 % (09/21 0349) Weight:  [61.1 kg] 61.1 kg (09/20 2026)     Height: 5\' 8"  (172.7 cm) Weight: 61.1 kg BMI (Calculated): 20.49   Intake/Output last 2 shifts:  09/20 0701 - 09/21 0700 In: 990.8 [I.V.:686.8; IV Piggyback:304] Out: 650 [Urine:650]   Physical Exam:  Constitutional: alert, cooperative and no distress  Respiratory: breathing non-labored at rest  Cardiovascular: regular rate and sinus rhythm  Integumentary: 2x2 cm I&D to the left forearm, there is surrounding induration, no undrained areas of fluctuance. He has two indurated areas to the bilateral wrist/thumb, these are indurated without fluctuance  Labs:  CBC Latest Ref Rng & Units 02/08/2020 06/07/2018 06/06/2018  WBC 4.0 - 10.5 K/uL 9.4 8.3 12.0(H)  Hemoglobin 13.0 - 17.0 g/dL 06/08/2018 11.5(L) 11.5(L)  Hematocrit 39 - 52 % 43.7 36.3(L) 34.8(L)  Platelets 150 - 400 K/uL 288 231 242   CMP Latest Ref Rng & Units 02/09/2020 02/08/2020 06/07/2018  Glucose 70 - 99 mg/dL 06/09/2018) 517(OH) 607(PX)  BUN 6 - 20 mg/dL 9 10 9   Creatinine 0.61 - 1.24 mg/dL 106(Y 6.94  Sodium 135 - 145 mmol/L 132(L) 124(L)  142  Potassium 3.5 - 5.1 mmol/L 3.8 4.2 3.5  Chloride 98 - 111 mmol/L 94(L) 85(L) 108  CO2 22 - 32 mmol/L 26 26 27   Calcium 8.9 - 10.3 mg/dL 8.9 9.3 8.54)  Total Protein 6.5 - 8.1 g/dL - 8.5(H) -  Total Bilirubin 0.3 - 1.2 mg/dL - 0.9 -  Alkaline Phos 38 - 126 U/L - 128(H) -  AST 15 - 41 U/L - 21 -  ALT 0 - 44 U/L - 23 -     Imaging studies: No new pertinent imaging studies   Assessment/Plan:  54 y.o. male with bacteremia, 1 day s/p bedside I&D for left mid-forearm abscess secondary to IVDA, complicated by pertinent comorbidities including IVDA and HSS in the setting of undiagnosed DM   - Okay for diet   - Continue Abx (Cefepime --> Day 1); BCx with enterobacterales and Serratia marcescens  - Continue local wound care daily + prn; Pack with iodoform gauze, cover, secure with tape --> I completed this this morning             - Pain control prn; I explained difficultly with pain control secondary to his chronic heroin use however he remains adamant on needing more narcotics             - Monitor indurated areas on bilateral thumb/wrist; no intervention currently             - Further management per primary service  All of the above findings and recommendations were discussed with the patient, and the medical team, and all of patient's questions were answered to his expressed satisfaction.  -- Lynden Oxford, PA-C [facility] 02/10/2020, 7:24 AM [phone] M-F: 7am - 4pm  Patient not available at the time I attempted to see him.

## 2020-02-10 NOTE — Plan of Care (Signed)
  Problem: Education: Goal: Knowledge of General Education information will improve Description: Including pain rating scale, medication(s)/side effects and non-pharmacologic comfort measures 02/10/2020 0454 by Deirdre Pippins, RN Outcome: Progressing 02/09/2020 2242 by Deirdre Pippins, RN Outcome: Progressing   Problem: Pain Managment: Goal: General experience of comfort will improve 02/10/2020 0454 by Deirdre Pippins, RN Outcome: Progressing 02/09/2020 2242 by Deirdre Pippins, RN Outcome: Progressing   Problem: Safety: Goal: Ability to remain free from injury will improve 02/10/2020 0454 by Deirdre Pippins, RN Outcome: Progressing 02/09/2020 2242 by Deirdre Pippins, RN Outcome: Progressing   Problem: Skin Integrity: Goal: Risk for impaired skin integrity will decrease 02/10/2020 0454 by Deirdre Pippins, RN Outcome: Progressing 02/09/2020 2242 by Deirdre Pippins, RN Outcome: Progressing

## 2020-02-10 NOTE — Progress Notes (Signed)
Pharmacy Antibiotic Note  Earl Moreno is a 54 y.o. male admitted on 02/09/2020 with bacteremia/absces.  Serratia marcescens growing in blood cx.   Pharmacy has been consulted for Cefepime dosing.  Plan: Cefepime 2 gm IV Q8H to start on 9/21 @ 0130  Height: 5\' 8"  (172.7 cm) Weight: 61.1 kg (134 lb 11.2 oz) IBW/kg (Calculated) : 68.4  Temp (24hrs), Avg:98.2 F (36.8 C), Min:98 F (36.7 C), Max:98.5 F (36.9 C)  Recent Labs  Lab 02/08/20 2129 02/09/20 0345  WBC 9.4  --   CREATININE 0.79 0.62    Estimated Creatinine Clearance: 90.2 mL/min (by C-G formula based on SCr of 0.62 mg/dL).    No Known Allergies  Antimicrobials this admission:   >>    >>   Dose adjustments this admission:   Microbiology results:  BCx:  Serratia Marcescens in 1 of 2 bottles (anaerobic)  UCx:    Sputum:    MRSA PCR:  Thank you for allowing pharmacy to be a part of this patient's care.  Dyron Kawano D 02/10/2020 1:19 AM

## 2020-02-10 NOTE — Progress Notes (Addendum)
Initial Nutrition Assessment  DOCUMENTATION CODES:   Severe malnutrition in context of social or environmental circumstances  INTERVENTION:  Provide Ensure Max Protein po BID, each supplement provides 150 kcal and 30 grams of protein. Patient prefers vanilla.  Provide MVI po daily.  Monitor magnesium, potassium, and phosphorus daily for at least 3 days, MD to replete as needed, as pt is at risk for refeeding syndrome given severe malnutrition.  RD provided "Carbohydrate Counting for People with Diabetes" handout from the Academy of Nutrition and Dietetics. Discussed different food groups and their effects on blood sugar, emphasizing carbohydrate-containing foods. Provided list of carbohydrates and recommended serving sizes of common foods. Discussed importance of controlled and consistent carbohydrate intake throughout the day. Provided examples of ways to balance meals/snacks and encouraged intake of high-fiber, whole grain complex carbohydrates. Teach back method used. Expect fair compliance.  NUTRITION DIAGNOSIS:   Severe Malnutrition related to social / environmental circumstances (IV drug use, homelessness, food insecurity) as evidenced by severe fat depletion, moderate muscle depletion, severe muscle depletion.  GOAL:   Patient will meet greater than or equal to 90% of their needs  MONITOR:   PO intake, Supplement acceptance, Labs, Weight trends, Skin, I & O's  REASON FOR ASSESSMENT:   Consult Assessment of nutrition requirement/status, Diet education  ASSESSMENT:   54 year old male with PMHx of IV drug use (cocaine/heroin) and intranasal cocaine use admitted with newly diagnosed DM type 2, multiple skin abscesses.   9/20 s/p I&D to abscess on left forearm  Met with patient and his wife at bedside. Patient reports he has had a poor appetite for the past month and has not been able to eat well. He reports he and his wife struggle with homelessness. They live in a car and  have difficulty obtaining food. He reports he used to go to a shelter/soup kitchen and eat 1-2 good meals per day plus have snacks between meals. Now he may go days between eating anything and when he does eat it is a small meal. He has been feeling too hot and thirsty to eat and also too weak to try to get to the soup kitchen. His wife states he also tends to use drugs in the afternoon and will be unable to access food once he has used. He reports he has been very thirsty and overnight will drink one gallon of water and 1-2 liters of soda. Patient reports his appetite has improved and he is eating well here. He is eating 90-100% of his meals. Patient is amenable to drinking Ensure Max to help meet protein needs. Discussed patient's malnutrition status with patient and wife. Stressed importance of obtaining adequate intake of calories and protein. Patient's wife reports they are familiar with food/nutrition resources in [place] but patient has difficulty getting to the resources after he has used. RD did provide diet education to patient and wife but main nutrition priority at this time is addressing patient's malnutrition. Of note patient cannot read handout but his wife can. RD left contact information on handout.  Patient's UBW was 170-175 lbs. Wife reports he has lost weight over time and is now around 130 lbs. Per chart patient was 69.6 kg on 06/02/2018. He is now 61.1 kg (134.7 lbs). He has lost 8.5 kg (12.2% body weight) over the past 8 months, which is not significant for time frame but is still concerning.  Medications reviewed and include: acidophilus 2 capsules TID, gabapentin, Novolog 0-15 units TID, Levemir 20 units   QHS, metformin 1000 mg BID, cefepime.  Labs reviewed: CBG 192-417, Sodium 134, Potassium 3.2, Creatinine 0.55.  NUTRITION - FOCUSED PHYSICAL EXAM:    Most Recent Value  Orbital Region Severe depletion  Upper Arm Region Severe depletion  Thoracic and Lumbar Region Moderate  depletion  Buccal Region Severe depletion  Temple Region Severe depletion  Clavicle Bone Region Severe depletion  Clavicle and Acromion Bone Region Severe depletion  Scapular Bone Region Severe depletion  Dorsal Hand Severe depletion  Patellar Region Moderate depletion  Anterior Thigh Region Moderate depletion  Posterior Calf Region Moderate depletion  Edema (RD Assessment) None  Hair Reviewed  Eyes Reviewed  Mouth Reviewed  Skin Reviewed  Nails Reviewed     Diet Order:   Diet Order            Diet - low sodium heart healthy           Diet Carb Modified Fluid consistency: Thin; Room service appropriate? Yes  Diet effective now                EDUCATION NEEDS:   No education needs have been identified at this time  Skin:  Skin Assessment: Skin Integrity Issues: (abscess to left arm s/p I&D)  Last BM:  Unknown  Height:   Ht Readings from Last 1 Encounters:  02/08/20 5' 8" (1.727 m)   Weight:   Wt Readings from Last 1 Encounters:  02/09/20 61.1 kg   Ideal Body Weight:  70 kg  BMI:  Body mass index is 20.48 kg/m.  Estimated Nutritional Needs:   Kcal:  1800-2000  Protein:  90-100 grams  Fluid:  1.8-2 L/day   King, MS, RD, LDN Pager number available on Amion 

## 2020-02-10 NOTE — Progress Notes (Signed)
Pt refused labdraw

## 2020-02-10 NOTE — Progress Notes (Addendum)
PROGRESS NOTE    Patient: Earl Moreno                            PCP: Patient, No Pcp Per                    DOB: [birth year]            DOA: 02/09/2020 QAS:[id number]             DOS: 02/10/2020, 8:24 AM   LOS: 1 day   Date of Service: The patient was seen and examined on 02/10/2020  Subjective:   The patient was seen and examined this Am. Remain hemodynamically stable, requesting for more pain medication, he does not like Dilaudid he is requesting morphine... He is confronted with his IV drug use stating he has been using over 40 years now ...   Brief Narrative:   HPI:  Earl Moreno is a 54 y.o. male with medical history significant for IV drug use  cocaine/heroin (patient admits to being a heroin addict) and uses intranasal cocaine as well.  Injects through his arms, left and right wrist, left forearm legs--multiple abscess He has had these abscesses for about a week and the one on his left forearm appears to be draining.   Increasing thirst, urinary frequency and worsening fatigue, weight loss. Patient has a strong family history of diabetes mellitus His last IV drug use was 1 day prior to his admission.  Blood sugars on admission greater than 800, Labs show sodium 124, potassium 4.2, chloride 85, bicarb 26, serum glucose 828, BUN 9, creatinine 0.79, calcium 9.3, albumin 3.7, AST 21, ALT 23, white count 9.4, hemoglobin 15.6, hematocrit 43.7, MCV 84.7, RDW 12.5, platelet count 288 Chest x-ray reviewed by me shows no infiltrate or effusion Left forearm x-ray shows no soft tissue gas, retained needle, or bony erosion. Twelve-lead EKG reviewed by me shows sinus tachycardia    Admitted for newly diabetes mellitus type 2, hyperglycemia nonketotic, multiple skin abscess, IV drug use.  Assessment & Plan:   Principal Problem:   Hyperglycemia due to type 2 diabetes mellitus ([place]) Active Problems:   New onset type 2 diabetes mellitus ([place])   Hyperglycemia due to diabetes  mellitus ([place])   Abscess   Heroin addiction ([place])   Hyponatremia   Essential hypertension    Hyperglycemia due to type 2 diabetes mellitus NEW onset diabetes mellitus type 2 Anion gap closed at 12, closed, DKA ruled out  Patient appears to have new onset diabetes mellitus and presents with significant weight loss, increased thirst, urinary frequency and fatigue  Initial blood sugar was greater than 800g/dl  CBG improving 519, 260 this a.m.  Continue aggressive IV fluid hydration Continue insulin drip >>> improving anticipating discontinuing drip -Initiating long-acting insulin 20 units nightly, SSI coverage -We will initiate Metformin -A1c:  Patient will require diabetic education and nutrition evaluation   Bacteremia / multiple abscesses/  SIRS/sepsis ruled out -1 out of 2 blood cultures growing entire factor -Patient is initiated on IV antibiotics of cefepime -Doxycycline for possible MRSA coverage -Repeating blood cultures in the morning of 02/11/2020  Patient with skin/soft tissue infection involving both wrists and left forearm General surgery consult following, continue wound care, no plan for I&D at this time Continue local wound care daily + prn; Pack with iodoform gauze, cover, secure with tape -->   Hypertension - Uncontrolled We will start patient on amlodipine  10 mg daily -Monitoring very closely, titrating meds, as needed hydralazine   Hyponatremia -Transient secondary to significant hyperglycemia -Monitoring sodium level closely, improving with IV fluid hydration and correction of hyperglycemia Secondary to significant hyperglycemia    Polysubstance dependence -Patient is urine drug screen positive for heroine/cocaine/opiates Patient has been counseled on the need to abstain from illicit drug use  Hepatitis  C Monitoring LFTs Hepatitis C reactive, quantitive RNA hepatitis C ordered Likely need to follow-up as an outpatient with  hematologist   DVT prophylaxis: Lovenox Code Status: Full code Family Communication: NONE - discussing plan of care with patient and his wife at the bedside.   Disposition Plan: Back to previous home environment Consults called: Surgery      Nutritional status:            Cultures; Blood Cultures x 2 >>    Antimicrobials: IV cefepime 9/21 >>>  P.o. doxycycline 9/21>>      Admission status:    Status is: Inpatient  Remains inpatient appropriate because:Inpatient level of care appropriate due to severity of illness   Dispo: The patient is from: Home              Anticipated d/c is to: Home              Anticipated d/c date is: > 3 days              Patient currently is not medically stable to d/c.        Procedures:   No admission procedures for hospital encounter.    Antimicrobials:  Anti-infectives (From admission, onward)   Start     Dose/Rate Route Frequency Ordered Stop   02/10/20 0130  ceFEPIme (MAXIPIME) 2 g in sodium chloride 0.9 % 100 mL IVPB        2 g 200 mL/hr over 30 Minutes Intravenous Every 8 hours 02/10/20 0116     02/09/20 1000  doxycycline (VIBRA-TABS) tablet 100 mg  Status:  Discontinued        100 mg Oral Every 12 hours 02/09/20 0824 02/10/20 0115   02/09/20 0415  vancomycin (VANCOCIN) IVPB 1000 mg/200 mL premix        1,000 mg 200 mL/hr over 60 Minutes Intravenous  Once 02/09/20 0401 02/09/20 0830       Medication:  . amLODipine  10 mg Oral Daily  . enoxaparin (LOVENOX) injection  40 mg Subcutaneous Q24H  . insulin aspart  0-15 Units Subcutaneous TID WC  . insulin detemir  20 Units Subcutaneous QHS  . insulin starter kit- pen needles  1 kit Other Once  . lactobacillus acidophilus  2 tablet Oral TID  . sodium chloride flush  10-40 mL Intracatheter Q12H    acetaminophen **OR** acetaminophen, acetaminophen, dextrose, HYDROmorphone (DILAUDID) injection, ketorolac, nicotine polacrilex, ondansetron **OR** ondansetron  (ZOFRAN) IV, sodium chloride flush   Objective:   Vitals:   02/09/20 2024 02/09/20 2026 02/10/20 0024 02/10/20 0349  BP: 135/87  (!) 151/100 130/72  Pulse: 71  (!) 59 (!) 55  Resp: $Remo'20  17 14  'kLcNo$ Temp: 98.5 F (36.9 C)  98.1 F (36.7 C) 98.8 F (37.1 C)  TempSrc: Oral  Oral   SpO2: 98%  99% 98%  Weight:  61.1 kg    Height:        Intake/Output Summary (Last 24 hours) at 02/10/2020 0824 Last data filed at 02/10/2020 0600 Gross per 24 hour  Intake 990.83 ml  Output 650 ml  Net 340.83  ml   Filed Weights   02/08/20 2048 02/09/20 2026  Weight: 59 kg 61.1 kg     Examination:   Physical Exam  Constitution:  Alert, cooperative, no distress,  Appears calm and comfortable  Psychiatric: Normal and stable mood and affect, cognition intact,   HEENT: Normocephalic, PERRL, otherwise with in Normal limits  Chest:Chest symmetric Cardio vascular:  S1/S2, RRR, No murmure, No Rubs or Gallops  pulmonary: Clear to auscultation bilaterally, respirations unlabored, negative wheezes / crackles Abdomen: Soft, non-tender, non-distended, bowel sounds,no masses, no organomegaly Muscular skeletal: Limited exam - in bed, able to move all 4 extremities, Normal strength,  Neuro: CNII-XII intact. , normal motor and sensation, reflexes intact  Extremities: No pitting edema lower extremities, +2 pulses  Skin: Dry, warm to touch, negative for any Rashes,  Multiple visible abscesses - left and right wrist, left forearm legs-  Wounds: per nursing documentation-for detailed description    ------------------------------------------------------------------------------------------------------------------------------------------    LABs:  CBC Latest Ref Rng & Units 02/08/2020 06/07/2018 06/06/2018  WBC 4.0 - 10.5 K/uL 9.4 8.3 12.0(H)  Hemoglobin 13.0 - 17.0 g/dL 15.6 11.5(L) 11.5(L)  Hematocrit 39 - 52 % 43.7 36.3(L) 34.8(L)  Platelets 150 - 400 K/uL 288 231 242   CMP Latest Ref Rng & Units 02/09/2020  02/08/2020 06/07/2018  Glucose 70 - 99 mg/dL 519(HH) 828(HH) 104(H)  BUN 6 - 20 mg/dL $Remove'9 10 9  'VfNMXvm$ Creatinine 0.61 - 1.24 mg/dL 0.62 0.79 0.66  Sodium 135 - 145 mmol/L 132(L) 124(L) 142  Potassium 3.5 - 5.1 mmol/L 3.8 4.2 3.5  Chloride 98 - 111 mmol/L 94(L) 85(L) 108  CO2 22 - 32 mmol/L $RemoveB'26 26 27  'iMqQjunb$ Calcium 8.9 - 10.3 mg/dL 8.9 9.3 8.3(L)  Total Protein 6.5 - 8.1 g/dL - 8.5(H) -  Total Bilirubin 0.3 - 1.2 mg/dL - 0.9 -  Alkaline Phos 38 - 126 U/L - 128(H) -  AST 15 - 41 U/L - 21 -  ALT 0 - 44 U/L - 23 -       Micro Results Recent Results (from the past 240 hour(s))  SARS Coronavirus 2 by RT PCR (hospital order, performed in [facility] hospital lab) Nasopharyngeal Nasopharyngeal Swab     Status: None   Collection Time: 02/09/20  3:45 AM   Specimen: Nasopharyngeal Swab  Result Value Ref Range Status   SARS Coronavirus 2 NEGATIVE NEGATIVE Final    Comment: (NOTE) SARS-CoV-2 target nucleic acids are NOT DETECTED.  The SARS-CoV-2 RNA is generally detectable in upper and lower respiratory specimens during the acute phase of infection. The lowest concentration of SARS-CoV-2 viral copies this assay can detect is 250 copies / mL. A negative result does not preclude SARS-CoV-2 infection and should not be used as the sole basis for treatment or other patient management decisions.  A negative result may occur with improper specimen collection / handling, submission of specimen other than nasopharyngeal swab, presence of viral mutation(s) within the areas targeted by this assay, and inadequate number of viral copies (<250 copies / mL). A negative result must be combined with clinical observations, patient history, and epidemiological information.  Fact Sheet for Patients:   StrictlyIdeas.no  Fact Sheet for Healthcare Providers: BankingDealers.co.za  This test is not yet approved or  cleared by the [place] FDA and has been authorized for  detection and/or diagnosis of SARS-CoV-2 by FDA under an Emergency Use Authorization (EUA).  This EUA will remain in effect (meaning this test can be used) for the duration of  the COVID-19 declaration under Section 564(b)(1) of the Act, 21 U.S.C. section 360bbb-3(b)(1), unless the authorization is terminated or revoked sooner.  Performed at [facility], [address] [place], [place] [address]   Blood culture (routine x 2)     Status: None (Preliminary result)   Collection Time: 02/09/20  3:55 AM   Specimen: BLOOD  Result Value Ref Range Status   Specimen Description BLOOD RIGHT ARM  Final   Special Requests   Final    BOTTLES DRAWN AEROBIC AND ANAEROBIC Blood Culture results may not be optimal due to an inadequate volume of blood received in culture bottles   Culture  Setup Time   Final    GRAM NEGATIVE RODS ANAEROBIC BOTTLE ONLY Organism ID to follow CRITICAL RESULT CALLED TO, READ BACK BY AND VERIFIED WITHVioleta Gelinas @ 0057 02/10/20 RH Performed at [facility], [address], [place], [place] [address]    Culture GRAM NEGATIVE RODS  Final   Report Status PENDING  Incomplete  Wound or Superficial Culture     Status: None (Preliminary result)   Collection Time: 02/09/20  3:55 AM   Specimen: Arm; Wound  Result Value Ref Range Status   Specimen Description   Final    ARM LEFT FOREARM Performed at [facility], [address] [place], [place] [address]    Special Requests   Final    NONE Performed at [facility], [address] [place], [place] [address]    Gram Stain   Final    FEW WBC PRESENT, PREDOMINANTLY PMN RARE GRAM POSITIVE RODS RARE GRAM POSITIVE COCCI Performed at [facility], [place] [address], [place], [place] [address]    Culture PENDING  Incomplete   Report Status PENDING  Incomplete  Blood Culture ID Panel (Reflexed)     Status: Abnormal   Collection Time: 02/09/20  3:55 AM  Result Value Ref Range  Status   Enterococcus faecalis NOT DETECTED NOT DETECTED Final   Enterococcus Faecium NOT DETECTED NOT DETECTED Final   Listeria monocytogenes NOT DETECTED NOT DETECTED Final   Staphylococcus species NOT DETECTED NOT DETECTED Final   Staphylococcus aureus (BCID) NOT DETECTED NOT DETECTED Final   Staphylococcus epidermidis NOT DETECTED NOT DETECTED Final   Staphylococcus lugdunensis NOT DETECTED NOT DETECTED Final   Streptococcus species NOT DETECTED NOT DETECTED Final   Streptococcus agalactiae NOT DETECTED NOT DETECTED Final   Streptococcus pneumoniae NOT DETECTED NOT DETECTED Final   Streptococcus pyogenes NOT DETECTED NOT DETECTED Final   A.calcoaceticus-baumannii NOT DETECTED NOT DETECTED Final   Bacteroides fragilis NOT DETECTED NOT DETECTED Final   Enterobacterales DETECTED (A) NOT DETECTED Final    Comment: Enterobacterales represent a large order of gram negative bacteria, not a single organism.   Enterobacter cloacae complex NOT DETECTED NOT DETECTED Final   Escherichia coli NOT DETECTED NOT DETECTED Final   Klebsiella aerogenes NOT DETECTED NOT DETECTED Final   Klebsiella oxytoca NOT DETECTED NOT DETECTED Final   Klebsiella pneumoniae NOT DETECTED NOT DETECTED Final   Proteus species NOT DETECTED NOT DETECTED Final   Salmonella species NOT DETECTED NOT DETECTED Final   Serratia marcescens DETECTED (A) NOT DETECTED Final    Comment: CRITICAL RESULT CALLED TO, READ BACK BY AND VERIFIED WITH: JASON ROBBINS @ 0626 02/10/2020 RH    Haemophilus influenzae NOT DETECTED NOT DETECTED Final   Neisseria meningitidis NOT DETECTED NOT DETECTED Final   Pseudomonas aeruginosa NOT DETECTED NOT DETECTED Final   Stenotrophomonas  maltophilia NOT DETECTED NOT DETECTED Final   Candida albicans NOT DETECTED NOT DETECTED Final   Candida auris NOT DETECTED NOT DETECTED Final   Candida glabrata NOT DETECTED NOT DETECTED Final   Candida krusei NOT DETECTED NOT DETECTED Final   Candida parapsilosis  NOT DETECTED NOT DETECTED Final   Candida tropicalis NOT DETECTED NOT DETECTED Final   Cryptococcus neoformans/gattii NOT DETECTED NOT DETECTED Final   CTX-M ESBL NOT DETECTED NOT DETECTED Final   Carbapenem resistance IMP NOT DETECTED NOT DETECTED Final   Carbapenem resistance KPC NOT DETECTED NOT DETECTED Final   Carbapenem resistance NDM NOT DETECTED NOT DETECTED Final   Carbapenem resist OXA 48 LIKE NOT DETECTED NOT DETECTED Final   Carbapenem resistance VIM NOT DETECTED NOT DETECTED Final    Comment: Performed at [facility], [address] [place], [place] [address]  Aerobic/Anaerobic Culture (surgical/deep wound)     Status: None (Preliminary result)   Collection Time: 02/09/20 10:48 AM   Specimen: Abscess  Result Value Ref Range Status   Specimen Description ABSCESS LEFT ARM  Final   Special Requests NONE  Final   Gram Stain   Final    MODERATE WBC PRESENT,BOTH PMN AND MONONUCLEAR MODERATE GRAM POSITIVE RODS RARE GRAM NEGATIVE RODS RARE YEAST Performed at [facility], [place] [address] [place], [place] [address]    Culture PENDING  Incomplete   Report Status PENDING  Incomplete    Radiology Reports DG Chest 2 View  Result Date: 02/09/2020 CLINICAL DATA:  Tachycardia EXAM: CHEST - 2 VIEW COMPARISON:  01/06/2015 FINDINGS: The heart size and mediastinal contours are within normal limits. Both lungs are clear. The visualized skeletal structures are unremarkable. IMPRESSION: No active cardiopulmonary disease. Electronically Signed   By: Fidela Salisbury MD   On: 02/09/2020 04:55   DG Forearm Left  Result Date: 02/09/2020 CLINICAL DATA:  Intravenous drug abuse.  Abscess EXAM: LEFT FOREARM - 2 VIEW COMPARISON:  02/10/2012 FINDINGS: Focal soft tissue swelling along the proximal ventral forearm. No soft tissue emphysema is seen. No metallic foreign body to suggest retained needle fragment. A vague high-density over this area is nonspecific. No bony erosion or elbow  joint effusion. IMPRESSION: History of abscess with focal soft tissue swelling at the ventral and proximal forearm. No soft tissue gas, retained needle, or bony erosion. Electronically Signed   By: Monte Fantasia M.D.   On: 02/09/2020 04:55   DG Wrist Complete Left  Result Date: 02/09/2020 CLINICAL DATA:  Left hand abscess. EXAM: LEFT WRIST - COMPLETE 3+ VIEW COMPARISON:  None. FINDINGS: There is no evidence of fracture or dislocation. There is no evidence of arthropathy or other focal bone abnormality. Soft tissues are unremarkable. IMPRESSION: Negative. Electronically Signed   By: Monte Fantasia M.D.   On: 02/09/2020 04:53   DG Wrist Complete Right  Result Date: 02/09/2020 CLINICAL DATA:  Left hand abscess.  IV drug abuse. EXAM: RIGHT WRIST - COMPLETE 3+ VIEW COMPARISON:  None. FINDINGS: There is no evidence of fracture or dislocation. Possible mild skin irregularity to the dorsal right hand. Negative for bony erosion or soft tissue gas. Ganglia/cystic type lucencies at the scaphoid and hamate, incidental. IMPRESSION: No acute osseous finding or soft tissue emphysema. Electronically Signed   By: Monte Fantasia M.D.   On: 02/09/2020 04:53    SIGNED: Deatra James, MD, FACP, FHM. [facility],  Pager (please use [email or web address] to page/text)  If 7PM-7AM, please contact night-coverage [email or web address] Ota [facility] 02/10/2020, 8:24  AM     

## 2020-02-10 NOTE — Progress Notes (Signed)
PHARMACY - PHYSICIAN COMMUNICATION CRITICAL VALUE ALERT - BLOOD CULTURE IDENTIFICATION (BCID)  TERRIE GRAJALES is an 54 y.o. male who presented to [facility] on 02/09/2020 with a chief complaint of Abscess   Assessment:  Serratian marcesens in Blood Cx (1 of 2 bottles)  (include suspected source if known)  Name of physician (or Provider) Contacted: [place]   Current antibiotics: Doxycycline 100 mg PO BID   Changes to prescribed antibiotics recommended:  Yes, will d/c doxy and start cefepime 2 gm IV Q8H  Results for orders placed or performed during the hospital encounter of 02/09/20  Blood Culture ID Panel (Reflexed) (Collected: 02/09/2020  3:55 AM)  Result Value Ref Range   Enterococcus faecalis NOT DETECTED NOT DETECTED   Enterococcus Faecium NOT DETECTED NOT DETECTED   Listeria monocytogenes NOT DETECTED NOT DETECTED   Staphylococcus species NOT DETECTED NOT DETECTED   Staphylococcus aureus (BCID) NOT DETECTED NOT DETECTED   Staphylococcus epidermidis NOT DETECTED NOT DETECTED   Staphylococcus lugdunensis NOT DETECTED NOT DETECTED   Streptococcus species NOT DETECTED NOT DETECTED   Streptococcus agalactiae NOT DETECTED NOT DETECTED   Streptococcus pneumoniae NOT DETECTED NOT DETECTED   Streptococcus pyogenes NOT DETECTED NOT DETECTED   A.calcoaceticus-baumannii NOT DETECTED NOT DETECTED   Bacteroides fragilis NOT DETECTED NOT DETECTED   Enterobacterales DETECTED (A) NOT DETECTED   Enterobacter cloacae complex NOT DETECTED NOT DETECTED   Escherichia coli NOT DETECTED NOT DETECTED   Klebsiella aerogenes NOT DETECTED NOT DETECTED   Klebsiella oxytoca NOT DETECTED NOT DETECTED   Klebsiella pneumoniae NOT DETECTED NOT DETECTED   Proteus species NOT DETECTED NOT DETECTED   Salmonella species NOT DETECTED NOT DETECTED   Serratia marcescens DETECTED (A) NOT DETECTED   Haemophilus influenzae NOT DETECTED NOT DETECTED   Neisseria meningitidis NOT DETECTED NOT DETECTED   Pseudomonas  aeruginosa NOT DETECTED NOT DETECTED   Stenotrophomonas maltophilia NOT DETECTED NOT DETECTED   Candida albicans NOT DETECTED NOT DETECTED   Candida auris NOT DETECTED NOT DETECTED   Candida glabrata NOT DETECTED NOT DETECTED   Candida krusei NOT DETECTED NOT DETECTED   Candida parapsilosis NOT DETECTED NOT DETECTED   Candida tropicalis NOT DETECTED NOT DETECTED   Cryptococcus neoformans/gattii NOT DETECTED NOT DETECTED   CTX-M ESBL NOT DETECTED NOT DETECTED   Carbapenem resistance IMP NOT DETECTED NOT DETECTED   Carbapenem resistance KPC NOT DETECTED NOT DETECTED   Carbapenem resistance NDM NOT DETECTED NOT DETECTED   Carbapenem resist OXA 48 LIKE NOT DETECTED NOT DETECTED   Carbapenem resistance VIM NOT DETECTED NOT DETECTED    Deania Siguenza D 02/10/2020  1:17 AM

## 2020-02-10 NOTE — Progress Notes (Addendum)
Inpatient Diabetes Program Recommendations  AACE/ADA: New Consensus Statement on Inpatient Glycemic Control   Target Ranges:  Prepandial:   less than 140 mg/dL      Peak postprandial:   less than 180 mg/dL (1-2 hours)      Critically ill patients:  140 - 180 mg/dL   Results for Earl Moreno, Earl Moreno (MRN [id number]) as of 02/10/2020 08:21  Ref. Range 02/09/2020 08:21 02/09/2020 10:10 02/09/2020 11:09 02/09/2020 12:42 02/09/2020 12:48 02/09/2020 14:40 02/09/2020 16:15 02/09/2020 17:16 02/09/2020 20:29 02/10/2020 07:58  Glucose-Capillary Latest Ref Range: 70 - 99 mg/dL 166 (H) 063 (H) 016 (H) 195 (H) 224 (H) 298 (H) 278 (H) 192 (H) 417 (H) 260 (H)   Review of Glycemic Control  Diabetes history: NO Outpatient Diabetes medications: NA Current orders for Inpatient glycemic control: Levemir 20 units QHS, Novolog 0-15 units TID with meals  Inpatient Diabetes Program Recommendations:    Insulin: Levemir 20 units was not given until 21:08 on 02/09/20 (should have been given 1-2 hours prior to stopping IV insulin which was stopped at 17:09 on 02/09/20). Please consider increasing Levemir to 24 units QHS, add Novolog 4 units TID with meals for meal coverage if patient eats at least 50% of meals, and add Novolog 0-5 units QHS for bedtime correction.  Addendum 02/10/20@10 :55-Called patient over the phone to see if his wife had arrived to the hospital yet for DM education and he stated that she was not here yet but would be later. Will plan to talk with patient and his wife again today regarding new DM dx.  Addendum 02/10/20@14 :00-Spoke with patient and his wife about new diabetes diagnosis.  Patient's wife reports that she has some knowledge about DM as she has several family members with DM and patient's mother and 2 half sisters have DM as well.  Patient has Living Well with DM book at bedside and has not looked over the book yet (as he can not read). Encouraged patient's wife to read over the entire book with the patient  to increase knowledge about DM and self-management.  Discussed initial glucose of 828 mg/dl on 0/10/93. Explained what an A1C is and informed patient that his current A1C has not resulted yet. Discussed basic pathophysiology of DM Type 1 and Type 2, basic home care, importance of checking CBGs and maintaining good CBG control to prevent long-term and short-term complications. Reviewed glucose and A1C goals.  Reviewed signs and symptoms of hyperglycemia and hypoglycemia along with treatment for both. Discussed impact of nutrition, exercise, stress, sickness, and medications on diabetes control.  Discussed carbohydrate modified diet and informed patient that RD should be talking more in depth about carb modified diet.  Explained that patient is currently ordered Lantus and Novolog insulin but at this time, MD has not indicated if patient will discharge on insulin or not.  Discussed Lantus and Novolog insulin and how each insulin works. Discussed insulin vials and insulin pens. Patient states that he would prefer to use insulin pens if possible. TOC is consulted as patient will need medication assistance and will also need a glucometer and testing supplies.  Educated patient and spouse on insulin pen use at home. Reviewed all steps of insulin pen including attachment of needle, 2-unit air shot, dialing up dose, giving injection, removing needle, disposal of sharps, storage of unused insulin, disposal of insulin etc. Patient able to provide successful return demonstration. Encouraged patient to check his glucose 4 times per day (before meals and at bedtime) and to keep a  log book of glucose readings.  Explained how the doctor he follows up with can use the log book to continue to make adjustments with DM medications if needed. RN will be asking patient to self-administer insulin to ensure proper technique and ability to administer self insulin shots.   Patient verbalized understanding of information discussed and he  states that he has no further questions at this time related to diabetes.   RNs to provide ongoing basic DM education at bedside with this patient and engage patient to actively check blood glucose and administer insulin injections.  At discharge, if patient is discharged on insulin he will need insulins available at [facility]. Therefore, please prescribe Nona Dell [phone]), Humalog Kwikpens [phone]) and insulin pen needles [phone]).   Thanks, [name], RN, MSN, CDE Diabetes Coordinator Inpatient Diabetes Program [phone] (Team Pager from 8am to 5pm)

## 2020-02-11 DIAGNOSIS — E119 Type 2 diabetes mellitus without complications: Secondary | ICD-10-CM

## 2020-02-11 DIAGNOSIS — L0291 Cutaneous abscess, unspecified: Secondary | ICD-10-CM

## 2020-02-11 DIAGNOSIS — E43 Unspecified severe protein-calorie malnutrition: Secondary | ICD-10-CM | POA: Insufficient documentation

## 2020-02-11 LAB — BLOOD CULTURE ID PANEL (REFLEXED) - BCID2

## 2020-02-11 LAB — CBC WITH DIFFERENTIAL/PLATELET
Abs Immature Granulocytes: 0.03 10*3/uL (ref 0.00–0.07)
Basophils Absolute: 0.1 10*3/uL (ref 0.0–0.1)
Basophils Relative: 1 %
Eosinophils Absolute: 0.1 10*3/uL (ref 0.0–0.5)
Eosinophils Relative: 2 %
HCT: 47.7 % (ref 39.0–52.0)
Hemoglobin: 16.4 g/dL (ref 13.0–17.0)
Immature Granulocytes: 0 %
Lymphocytes Relative: 19 %
Lymphs Abs: 1.5 10*3/uL (ref 0.7–4.0)
MCH: 30.2 pg (ref 26.0–34.0)
MCHC: 34.4 g/dL (ref 30.0–36.0)
MCV: 87.8 fL (ref 80.0–100.0)
Monocytes Absolute: 0.4 10*3/uL (ref 0.1–1.0)
Monocytes Relative: 5 %
Neutro Abs: 6.1 10*3/uL (ref 1.7–7.7)
Neutrophils Relative %: 73 %
Platelets: 318 10*3/uL (ref 150–400)
RBC: 5.43 MIL/uL (ref 4.22–5.81)
RDW: 12.5 % (ref 11.5–15.5)
WBC: 8.3 10*3/uL (ref 4.0–10.5)
nRBC: 0 % (ref 0.0–0.2)

## 2020-02-11 LAB — MAGNESIUM: Magnesium: 1.7 mg/dL (ref 1.7–2.4)

## 2020-02-11 LAB — AEROBIC CULTURE W GRAM STAIN (SUPERFICIAL SPECIMEN)

## 2020-02-11 LAB — COMPREHENSIVE METABOLIC PANEL
ALT: 22 U/L (ref 0–44)
AST: 34 U/L (ref 15–41)
Albumin: 3.4 g/dL — ABNORMAL LOW (ref 3.5–5.0)
Alkaline Phosphatase: 92 U/L (ref 38–126)
Anion gap: 14 (ref 5–15)
BUN: 12 mg/dL (ref 6–20)
CO2: 22 mmol/L (ref 22–32)
Calcium: 9.7 mg/dL (ref 8.9–10.3)
Chloride: 100 mmol/L (ref 98–111)
Creatinine, Ser: 0.58 mg/dL — ABNORMAL LOW (ref 0.61–1.24)
GFR calc Af Amer: >60 mL/min (ref >60)
GFR calc non Af Amer: >60 mL/min (ref >60)
Glucose, Bld: 227 mg/dL — ABNORMAL HIGH (ref 70–99)
Potassium: 3.5 mmol/L (ref 3.5–5.1)
Sodium: 136 mmol/L (ref 135–145)
Total Bilirubin: 0.5 mg/dL (ref 0.3–1.2)
Total Protein: 8.3 g/dL — ABNORMAL HIGH (ref 6.5–8.1)

## 2020-02-11 LAB — GLUCOSE, CAPILLARY
Glucose-Capillary: 286 mg/dL — ABNORMAL HIGH (ref 70–99)
Glucose-Capillary: 337 mg/dL — ABNORMAL HIGH (ref 70–99)
Glucose-Capillary: 257 mg/dL — ABNORMAL HIGH (ref 70–99)
Glucose-Capillary: 321 mg/dL — ABNORMAL HIGH (ref 70–99)

## 2020-02-11 LAB — HCV RNA QUANT
HCV Quantitative Log: 6.354 log10 IU/mL (ref >1.70)
HCV Quantitative: 2260000 IU/mL (ref >50)

## 2020-02-11 LAB — LACTIC ACID, PLASMA: Lactic Acid, Venous: 4.2 mmol/L (ref 0.5–1.9)

## 2020-02-11 LAB — PHOSPHORUS: Phosphorus: 2 mg/dL — ABNORMAL LOW (ref 2.5–4.6)

## 2020-02-11 MED ORDER — INSULIN ASPART PROT & ASPART (70-30 MIX) 100 UNIT/ML ~~LOC~~ SUSP
18.0000 [IU] | Freq: Two times a day (BID) | SUBCUTANEOUS | Status: DC
  Administered 2020-02-11 (×2): 18 [IU] via SUBCUTANEOUS
  Filled 2020-02-11 (×3): qty 10

## 2020-02-11 MED ORDER — POTASSIUM PHOSPHATES 15 MMOLE/5ML IV SOLN
20.0000 mmol | Freq: Once | INTRAVENOUS | Status: AC
  Administered 2020-02-11: 20 mmol via INTRAVENOUS
  Filled 2020-02-11: qty 6.67

## 2020-02-11 MED ORDER — SODIUM CHLORIDE 0.9 % IV BOLUS
1000.0000 mL | Freq: Once | INTRAVENOUS | Status: AC
  Administered 2020-02-11: 1000 mL via INTRAVENOUS

## 2020-02-11 MED ORDER — SODIUM CHLORIDE 0.9 % IV SOLN: INTRAVENOUS | Status: DC

## 2020-02-11 MED ORDER — MAGNESIUM SULFATE IN D5W 1-5 GM/100ML-% IV SOLN
1.0000 g | Freq: Once | INTRAVENOUS | Status: AC
  Administered 2020-02-11: 1 g via INTRAVENOUS
  Filled 2020-02-11: qty 100

## 2020-02-11 NOTE — Progress Notes (Addendum)
Inpatient Diabetes Program Recommendations  AACE/ADA: New Consensus Statement on Inpatient Glycemic Control   Target Ranges:  Prepandial:   less than 140 mg/dL      Peak postprandial:   less than 180 mg/dL (1-2 hours)      Critically ill patients:  140 - 180 mg/dL   Results for Earl Moreno, Earl Moreno (MRN [id number]) as of 02/11/2020 07:33  Ref. Range 02/10/2020 07:58 02/10/2020 11:52 02/10/2020 16:33 02/10/2020 20:27  Glucose-Capillary Latest Ref Range: 70 - 99 mg/dL 631 (H) 497 (H) 026 (H) 313 (H)  Results for Earl Moreno, Earl Moreno (MRN [id number]) as of 02/11/2020 07:33  Ref. Range 02/09/2020 03:55  Hemoglobin A1C Latest Ref Range: 4.8 - 5.6 % >15.5 (H)   Review of Glycemic Control  Diabetes history: NO Outpatient Diabetes medications: NA Current orders for Inpatient glycemic control: Levemir 20 units QHS, Novolog 0-15 units TID with meals, Metformin 1000 mg BID  Inpatient Diabetes Program Recommendations:    Insulin: With postprandial glucose being consistently elevated and to simplify insulin regimen, consider discontinuing Levemir and ordering 70/30 18 units BID with breakfast and supper (dose will provide a total of 25.2 units for basal and 10.8 units for meal coverage per day).   HbgA1C:  A1C >15.5% on 02/09/20 indicating an average glucose of greater than 398 mg/dl over the past 2-3 months.  Addendum 02/11/20@11 :14-Spoke with patient over the phone regarding A1C and likely will need insulin outpatient. Reviewed what an A1C is again and discussed A1C of >15.5% which indicates an average glucose over 398 mg/dl over the past 2-3 months. Explained that given elevated A1C and insulin needs, he will most likely be discharged on insulin. Informed patient that since it appears he needs meal coverage insulin in addition to basal insulin, it has been requested that his insulin regimen be changed to Novolog 70/30. Discussed 70/30 insulin in more detail and how it is typically taken BID with breakfast and  supper. Informed patient that [facility] has Humalog 75/25 in pens so it would be requested that he be prescribed Humalog Kwikpen 75/25 insulin pens and insulin pen needles at time of discharge. Verbally reviewed how to use the insulin pen (which patient done very well with yesterday when practicing) and reminded patient to be sure to roll the insulin pen between palms prior to each use to mix the insulins. Patient verbalized understanding of information discussed and he states that he has no further questions at this time. At time of discharge, please provide Rx for: Humalog Kwikpen 75/25 [phone]) and insulin pen needles [phone]).  Thanks, [name], RN, MSN, CDE Diabetes Coordinator Inpatient Diabetes Program [phone] (Team Pager from 8am to 5pm)

## 2020-02-11 NOTE — Progress Notes (Addendum)
[facility] SURGICAL PROGRESS NOTE  Hospital Day(s): 2.   Interval History:  Patient seen and examined no acute events or new complaints overnight.  Patient resting in bed, does not want to participate in exam or history Patient continues to refuse lab draws, no labs Initial BCx growing enterobacterales and Serratia marcescens  Wound Cx from 09/20 growing gram positive rods, gram negative rods Antibiotics switched to Cefepime   Vital signs in last 24 hours: [min-max] current  Temp:  [98 F (36.7 C)-98.3 F (36.8 C)] 98.3 F (36.8 C) (09/21 2344) Pulse Rate:  [62-68] 65 (09/21 2344) Resp:  [15-17] 16 (09/21 2344) BP: (124-145)/(64-94) 124/71 (09/21 2344) SpO2:  [100 %] 100 % (09/21 2344)     Height: 5\' 8"  (172.7 cm) Weight: 61.1 kg BMI (Calculated): 20.49   Intake/Output last 2 shifts:  09/21 0701 - 09/22 0700 In: 1087.5 [P.O.:940; I.V.:42; IV Piggyback:105.4] Out: 1050 [Urine:1050]   Physical Exam:  Constitutional: sleeping, refuses to participate  Respiratory: breathing non-labored at rest  Cardiovascular: regular rate and sinus rhythm  Integumentary: dressing to left extremity, refusing to left me change this   Labs:  CBC Latest Ref Rng & Units 02/08/2020 06/07/2018 06/06/2018  WBC 4.0 - 10.5 K/uL 9.4 8.3 12.0(H)  Hemoglobin 13.0 - 17.0 g/dL 06/08/2018 11.5(L) 11.5(L)  Hematocrit 39 - 52 % 43.7 36.3(L) 34.8(L)  Platelets 150 - 400 K/uL 288 231 242   CMP Latest Ref Rng & Units 02/10/2020 02/09/2020 02/08/2020  Glucose 70 - 99 mg/dL 02/10/2020) 412(I) 786(VE)  BUN 6 - 20 mg/dL 11 9 10   Creatinine 0.61 - 1.24 mg/dL 720(NO) 7.09(G  Sodium 135 - 145 mmol/L 134(L) 132(L) 124(L)  Potassium 3.5 - 5.1 mmol/L 3.2(L) 3.8 4.2  Chloride 98 - 111 mmol/L 98 94(L) 85(L)  CO2 22 - 32 mmol/L 24 26 26   Calcium 8.9 - 10.3 mg/dL 2.83) 8.9 9.3  Total Protein 6.5 - 8.1 g/dL - - 8.5(H)  Total Bilirubin 0.3 - 1.2 mg/dL - - 0.9  Alkaline Phos 38 - 126 U/L - - 128(H)  AST 15 - 41  U/L - - 21  ALT 0 - 44 U/L - - 23     Imaging studies: No new pertinent imaging studies   Assessment/Plan:  54 y.o. 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, complicated by pertinent comorbidities including IVDA and HSS in the setting of undiagnosed DM   - Okay for diet              - Continue Abx (Cefepime --> Day 2); BCx with enterobacterales and Serratia marcescens             - Continue local wound care daily + prn; Pack with iodoform gauze, cover, secure with tape              - Pain control prn;  In previous day, I explained difficultly with pain control secondary to his chronic heroin use however he remains adamant on needing more narcotics             - Monitor indurated areas on bilateral thumb/wrist; no intervention currently             - Further management per primary service    - Discharge Planning; No further surgical intervention, we will sign off. Continue daily wound care as outlined above, deescalate ABx per cultures and sensitivities, he will benefit from 10-14 days total. I will be  happy to see him in my clinic once weekly as needed, in the past he states he has seen the wound care center as well, but I am unsure how reliable he is.     All of the above findings and recommendations were discussed with the patient, and the medical team, and all of patient's questions were answered to his expressed satisfaction.  -- Lynden Oxford, PA-C [facility] 02/11/2020, 7:16 AM [phone] M-F: 7am - 4pm  I agree with the above documentation.

## 2020-02-11 NOTE — TOC Initial Note (Signed)
Transition of Care [facility]) - Initial/Assessment Note    Patient Details  Name: Earl Moreno MRN: [id number] Date of Birth: 1965-09-20  Transition of Care [facility]) CM/SW Contact:    Shelbie Ammons, RN Phone Number: 02/11/2020, 8:45 AM  Clinical Narrative:    RNCM met with patient in room, patient sitting up in bed eating breakfast. Patient was provided with Glucometer kit as well as being instructed on application process for both [facility] and [facility]. Patient denies any questions about either place reporting his wife is very familiar with them. Patient requests that [facility] be [facility] to [facility]. Patient verbalizing questions about when he will be discharged.  RNCM updated pharmacy in computer.  RNCM sent message to [facility] regarding referral.                Expected Discharge Plan: Home/Self Care Barriers to Discharge: Continued Medical Work up   Patient Goals and CMS Choice        Expected Discharge Plan and Services Expected Discharge Plan: Home/Self Care       Living arrangements for the past 2 months: Single Family Home                                      Prior Living Arrangements/Services Living arrangements for the past 2 months: Single Family Home Lives with:: Spouse Patient language and need for interpreter reviewed:: Yes Do you feel safe going back to the place where you live?: Yes      Need for Family Participation in Patient Care: Yes (Comment) Care giver support system in place?: Yes (comment)   Criminal Activity/Legal Involvement Pertinent to Current Situation/Hospitalization: No - Comment as needed  Activities of Daily Living Home Assistive Devices/Equipment: None ADL Screening (condition at time of admission) Patient's cognitive ability adequate to safely complete daily activities?: Yes Is the patient deaf or have difficulty hearing?: No Does the patient have difficulty seeing, even when wearing  glasses/contacts?: No Does the patient have difficulty concentrating, remembering, or making decisions?: No Patient able to express need for assistance with ADLs?: No Does the patient have difficulty dressing or bathing?: No Independently performs ADLs?: Yes (appropriate for developmental age) Does the patient have difficulty walking or climbing stairs?: No Weakness of Legs: None Weakness of Arms/Hands: None  Permission Sought/Granted                  Emotional Assessment Appearance:: Appears older than stated age Attitude/Demeanor/Rapport: Complaining Affect (typically observed): Appropriate Orientation: : Oriented to Self, Oriented to Place, Oriented to  Time, Oriented to Situation   Psych Involvement: No (comment)  Admission diagnosis:  Hyperglycemia [R73.9] Polysubstance abuse ([place]) [F19.10] Abscess of multiple sites [L02.91] Hyperglycemia due to diabetes mellitus ([place]) [E11.65] Hyperosmolar hyperglycemic state (HHS) ([place]) [E11.00, E11.65] Patient Active Problem List   Diagnosis Date Noted  . Protein-calorie malnutrition, severe 02/11/2020  . New onset type 2 diabetes mellitus ([place]) 02/09/2020  . Hyperglycemia due to type 2 diabetes mellitus ([place]) 02/09/2020  . Hyperglycemia due to diabetes mellitus ([place]) 02/09/2020  . Abscess 02/09/2020  . Heroin addiction ([place]) 02/09/2020  . Hyponatremia 02/09/2020  . Essential hypertension 02/09/2020  . Cellulitis of left hand 06/02/2018   PCP:  Patient, No Pcp Per Pharmacy:   [facility] [address]), [place] ([place])  [address] Phone:  [phone] Fax: [phone]     Social Determinants of Health (SDOH) Interventions    Readmission Risk Interventions Readmission Risk Prevention Plan 06/07/2018  Post Dischage Appt Complete  Medication Screening Complete  Transportation Screening Complete  PCP follow-up Complete  Some recent data might be  hidden

## 2020-02-11 NOTE — Progress Notes (Signed)
Informed by Lab tech that patient refused morning labs

## 2020-02-11 NOTE — Progress Notes (Signed)
Informed by Lab tech that patient refused labs.

## 2020-02-11 NOTE — Progress Notes (Signed)
PROGRESS NOTE    Earl Moreno  MAU:[id number] DOB: 25-Jun-1965 DOA: 02/09/2020 PCP: Patient, No Pcp Per   Brief Narrative:  HPI per Dr. Royce Macadamia Agbata on 02/09/20 KNIGHT OELKERS is a 54 y.o. male with medical history significant for IV drug use (patient admits to being a heroin addict) and uses intranasal cocaine as well.  He was brought into the emergency room by his wife for evaluation of multiple abscesses involving both left and right wrist as well as left forearm.  He has had these abscesses for about a week and the one on his left forearm appears to be draining.  His wife states that he has had about a 30 pound weight loss over the last couple of weeks associated with increasing thirst, urinary frequency and worsening fatigue.  He denies having any fever chills, he has no shortness of breath, no abdominal pain, no chest pain, no changes in his bowel habits or any urinary symptoms. Patient has a strong family history of diabetes mellitus His last IV drug use was 1 day prior to his admission. Labs show sodium 124, potassium 4.2, chloride 85, bicarb 26, serum glucose 828, BUN 9, creatinine 0.79, calcium 9.3, albumin 3.7, AST 21, ALT 23, white count 9.4, hemoglobin 15.6, hematocrit 43.7, MCV 84.7, RDW 12.5, platelet count 288 Chest x-ray reviewed by me shows no infiltrate or effusion Left forearm x-ray shows no soft tissue gas, retained needle, or bony erosion. Twelve-lead EKG reviewed by me shows sinus tachycardia   ED Course: Patient is a 54 year old Caucasian male who is a heroin addict and who presented to the ER for evaluation of multiple abscesses on both wrist as well as his left forearm.  He also gives a history of significant weight loss, increasing thirst, urinary frequency and increased fatigue.  His labs revealed a glucose of greater than 800 and normal serum bicarbonate level.  Patient was started on aggressive IV fluid hydration and received a dose of IV vancomycin.  He is  currently on regular insulin infusion will be admitted to the hospital for further evaluation.  **Interim History General surgery was consulted and incision and drainage in the abscess of the left forearm.  Initial blood cultures were growing enterobacterales of Serratia marcescens and wound culture from 920 started growing gram-positive rods and gram-negative rods and antibiotics were switched to cefepime.  Patient threatened to leave AMA yesterday and was very belligerent with staff and refused labs morning.  He is finally agreeable to obtain labs and he has a lactic acidosis now and we have discontinued Metformin given him normal saline bolus and maintenance IV fluids.  Patient continues to ask for increase in his pain medication stating that he is a heroin addict and that he is getting "dope sick" and feels like he is hallucinating however states that he appears comfortable in no acute distress.  We will continue his current pain regimen at this time as well as his Toradol and have to be judicious with his IV narcotics given his IV drug abuse.  Assessment & Plan:   Principal Problem:   Hyperglycemia due to type 2 diabetes mellitus ([place]) Active Problems:   New onset type 2 diabetes mellitus ([place])   Hyperglycemia due to diabetes mellitus ([place])   Abscess   Heroin addiction ([place])   Hyponatremia   Essential hypertension   Protein-calorie malnutrition, severe  Hyperglycemia due to type 2 diabetes mellitus NEW onset diabetes mellitus type 2 -Anion gap closed at 12, closed, DKA ruled  out -Patient appears to have new onset diabetes mellitus and presents with significant weight loss, increased thirst, urinary frequency and fatigue -Initialblood sugar was greater than 800g/dl CBG improving 519, 260 this a.m. -Continue aggressive IV fluid hydration; resumed IV fluid hydration given his lactic acidosis -Continue insulin drip >>> improving anticipating discontinuing drip -Initiating long-acting  insulin 20 units nightly this is a change at 7030 and will need to be changed to 7525 in the outpatient setting, SSI coverage -We will initiate Metformin however it caused a lactic acidosis and will stop now -A1c:  Greater than 15.5 -Patient will require diabetic education and nutrition evaluation -CBG's ranging from 234-337  Bacteremia / multiple Abscesses  SIRS/sepsis ruled out -1 out of 2 blood cultures growing Serratia marcescens susceptibilities to follow -Patient is initiated on IV antibiotics of cefepime -Doxycycline for possible MRSA coverage -Repeating blood cultures in the morning of 02/11/2020 and this was not done as patient refused this morning -Patient with skin/soft tissue infection involving both wrists and left forearm -General surgery consult following, continue wound care, no plan for I&D at this time -Continue local wound care daily + prn; Pack with iodoform gauze, cover, secure with tape-->  -Appreciate surgery evaluation and he will need at least 10 to 14 days of antibiotics when sensitivities are obtained -General surgery recommending continuing daily wound care as outlined and de-escalating antibiotics per culture sensitivities and will see him in the outpatient setting if necessary  Hypertension - Uncontrolled -We will start patient on amlodipine 10 mg daily -Monitoring very closely, titrating meds, as needed hydralazine  Hyponatremia -Transient secondary to significant hyperglycemia -Monitoring sodium level closely, improving with IV fluid hydration and correction of hyperglycemia -Secondary to significant hyperglycemia -Sodium is 136 this afternoon Polysubstance dependence -Patient is urine drug screen positive for heroine/cocaine/opiates -Patient has been counseled on the need to abstain from illicit drug use -Judicious use of opiates given his IV drug abuse past  Hepatitis  C -Continue monitoring LFTs -Hepatitis C reactive, quantitive RNA hepatitis  C ordered -Likely need to follow-up as an outpatient with infectious disease  Lactic acidosis -Lactic acid level has trended all the way to 4.2 -Given a liter of normal saline bolus -We will stop his Metformin -Start maintenance IV fluids at 75 MLS per hour -Continue monitor and trend lactic acid levels if patient allows  Hypophosphatemia -Patient's phosphorus level was 2.0 and replete with IV K-Phos 20 mmol -Continue monitor and replete as necessary  Severe protein calorie malnutrition -In the setting of his heroin abuse -Nutritionist consulted for further evaluation and recommendations and recommending continuing supplements   DVT prophylaxis: Enoxaparin 40 g subcu every 24 Code Status: FULL CODE Family Communication: Discussed with spouse at bedside Disposition Plan: Pending further improvement of blood sugars as well as evaluation and treatment of his gram-negative bacteremia  Status is: Inpatient  Remains inpatient appropriate because:Ongoing diagnostic testing needed not appropriate for outpatient work up and Inpatient level of care appropriate due to severity of illness   Dispo: The patient is from: Home              Anticipated d/c is to: Home               Anticipated d/c date is: 2 days              Patient currently is not medically stable to d/c.  Consultants:   General Surgery   Procedures: I and D by General Surgery  Antimicrobials:  Anti-infectives (From  admission, onward)   Start     Dose/Rate Route Frequency Ordered Stop   02/10/20 0130  ceFEPIme (MAXIPIME) 2 g in sodium chloride 0.9 % 100 mL IVPB        2 g 200 mL/hr over 30 Minutes Intravenous Every 8 hours 02/10/20 0116     02/10/20 0000  doxycycline (VIBRAMYCIN) 50 MG capsule        100 mg Oral 2 times daily 02/10/20 1505 02/24/20 2359   02/09/20 1000  doxycycline (VIBRA-TABS) tablet 100 mg  Status:  Discontinued        100 mg Oral Every 12 hours 02/09/20 0824 02/10/20 0115   02/09/20 0415   vancomycin (VANCOCIN) IVPB 1000 mg/200 mL premix        1,000 mg 200 mL/hr over 60 Minutes Intravenous  Once 02/09/20 0401 02/09/20 0830        Subjective: And examined at bedside he was complaining of pain still.  No nausea or vomiting but feels like he was hallucinating.  Also states that he has been sneezing some.  No chest pain, lightheadedness or dizziness.  Nursing reports that he remains quite comfortable despite asking for increased pain medication dosing.  Objective: Vitals:   02/10/20 1630 02/10/20 2344 02/11/20 0746 02/11/20 1619  BP: 128/64 124/71 139/89 110/76  Pulse: 68 65 61 73  Resp: 15 16 18 16   Temp: 98 F (36.7 C) 98.3 F (36.8 C) (!) 97.5 F (36.4 C) 98.1 F (36.7 C)  TempSrc: Oral Oral Oral Oral  SpO2: 100% 100% 99% 95%  Weight:      Height:        Intake/Output Summary (Last 24 hours) at 02/11/2020 1835 Last data filed at 02/11/2020 1802 Gross per 24 hour  Intake 2472.42 ml  Output 1975 ml  Net 497.42 ml   Filed Weights   02/08/20 2048 02/09/20 2026  Weight: 59 kg 61.1 kg   Examination: Physical Exam:  Constitutional: Thin chronically ill-appearing Caucasian disheveled male in, NAD and appears calm and comfortable Eyes: Lids and conjunctivae normal, sclerae anicteric  ENMT: External Ears, Nose appear normal. Grossly normal hearing.  Neck: Appears normal, supple, no cervical masses, normal ROM, no appreciable thyromegaly; no JVD Respiratory: Diminished to auscultation bilaterally with coarse breath sounds, no wheezing, rales, rhonchi or crackles. Normal respiratory effort and patient is not tachypenic. No accessory muscle use.  Unlabored breathing Cardiovascular: RRR, no murmurs / rubs / gallops. S1 and S2 auscultated.  Abdomen: Soft, non-tender, non-distended. Bowel sounds positive.  GU: Deferred. Musculoskeletal: No clubbing / cyanosis of digits/nails. No joint deformity upper and lower extremities.  Skin: Has multiple skin lesions from where he  is injected himself with heroin.  Also has multiple abscesses and induration on his hands and left arm is wrapped. No induration; Warm and dry.  Neurologic: CN 2-12 grossly intact with no focal deficits. Romberg sign and cerebellar reflexes not assessed.  Psychiatric: Normal judgment and insight. Alert and oriented x 3.  Normal mood and appropriate affect.   Data Reviewed: I have personally reviewed following labs and imaging studies  CBC: Recent Labs  Lab 02/08/20 2129 02/11/20 1317  WBC 9.4 8.3  NEUTROABS 6.5 6.1  HGB 15.6 16.4  HCT 43.7 47.7  MCV 84.7 87.8  PLT 288 213   Basic Metabolic Panel: Recent Labs  Lab 02/08/20 2129 02/09/20 0345 02/10/20 1324 02/11/20 1317  NA 124* 132* 134* 136  K 4.2 3.8 3.2* 3.5  CL 85* 94* 98 100  CO2  26 26 24 22   GLUCOSE 828* 519* 279* 227*  BUN 10 9 11 12   CREATININE 0.79 0.62 0.55* 0.58*  CALCIUM 9.3 8.9 8.8* 9.7  MG  --   --   --  1.7  PHOS  --   --   --  2.0*   GFR: Estimated Creatinine Clearance: 90.2 mL/min (A) (by C-G formula based on SCr of 0.58 mg/dL (L)). Liver Function Tests: Recent Labs  Lab 02/08/20 2129 02/11/20 1317  AST 21 34  ALT 23 22  ALKPHOS 128* 92  BILITOT 0.9 0.5  PROT 8.5* 8.3*  ALBUMIN 3.7 3.4*   No results for input(s): LIPASE, AMYLASE in the last 168 hours. No results for input(s): AMMONIA in the last 168 hours. Coagulation Profile: No results for input(s): INR, PROTIME in the last 168 hours. Cardiac Enzymes: No results for input(s): CKTOTAL, CKMB, CKMBINDEX, TROPONINI in the last 168 hours. BNP (last 3 results) No results for input(s): PROBNP in the last 8760 hours. HbA1C: Recent Labs    02/09/20 0355  HGBA1C >15.5*   CBG: Recent Labs  Lab 02/10/20 1633 02/10/20 2027 02/11/20 0744 02/11/20 1151 02/11/20 1636  GLUCAP 296* 313* 286* 337* 257*   Lipid Profile: No results for input(s): CHOL, HDL, LDLCALC, TRIG, CHOLHDL, LDLDIRECT in the last 72 hours. Thyroid Function Tests: No  results for input(s): TSH, T4TOTAL, FREET4, T3FREE, THYROIDAB in the last 72 hours. Anemia Panel: No results for input(s): VITAMINB12, FOLATE, FERRITIN, TIBC, IRON, RETICCTPCT in the last 72 hours. Sepsis Labs: Recent Labs  Lab 02/10/20 1324 02/11/20 1317  PROCALCITON 0.10  --   LATICACIDVEN 2.7* 4.2*    Recent Results (from the past 240 hour(s))  SARS Coronavirus 2 by RT PCR (hospital order, performed in [facility] hospital lab) Nasopharyngeal Nasopharyngeal Swab     Status: None   Collection Time: 02/09/20  3:45 AM   Specimen: Nasopharyngeal Swab  Result Value Ref Range Status   SARS Coronavirus 2 NEGATIVE NEGATIVE Final    Comment: (NOTE) SARS-CoV-2 target nucleic acids are NOT DETECTED.  The SARS-CoV-2 RNA is generally detectable in upper and lower respiratory specimens during the acute phase of infection. The lowest concentration of SARS-CoV-2 viral copies this assay can detect is 250 copies / mL. A negative result does not preclude SARS-CoV-2 infection and should not be used as the sole basis for treatment or other patient management decisions.  A negative result may occur with improper specimen collection / handling, submission of specimen other than nasopharyngeal swab, presence of viral mutation(s) within the areas targeted by this assay, and inadequate number of viral copies (<250 copies / mL). A negative result must be combined with clinical observations, patient history, and epidemiological information.  Fact Sheet for Patients:   StrictlyIdeas.no  Fact Sheet for Healthcare Providers: BankingDealers.co.za  This test is not yet approved or  cleared by the [place] FDA and has been authorized for detection and/or diagnosis of SARS-CoV-2 by FDA under an Emergency Use Authorization (EUA).  This EUA will remain in effect (meaning this test can be used) for the duration of the COVID-19 declaration under Section  564(b)(1) of the Act, 21 U.S.C. section 360bbb-3(b)(1), unless the authorization is terminated or revoked sooner.  Performed at [facility], [address] [place], [place] [address]   Blood culture (routine x 2)     Status: Abnormal (Preliminary result)   Collection Time: 02/09/20  3:55 AM   Specimen: BLOOD  Result Value Ref Range Status  Specimen Description   Final    BLOOD RIGHT ARM Performed at [facility], [address] [place], [place] [address]    Special Requests   Final    BOTTLES DRAWN AEROBIC AND ANAEROBIC Blood Culture results may not be optimal due to an inadequate volume of blood received in culture bottles Performed at [facility], [address] [place], [place] [address]    Culture  Setup Time   Final    GRAM NEGATIVE RODS ANAEROBIC BOTTLE ONLY Organism ID to follow CRITICAL RESULT CALLED TO, READ BACK BY AND VERIFIED WITHVioleta Gelinas @ 0057 02/10/20 RH Performed at [facility], [address] [place], [place] [address]    Culture (A)  Final    SERRATIA MARCESCENS SUSCEPTIBILITIES TO FOLLOW Performed at [facility], [place] [address], [place], [place] [address]    Report Status PENDING  Incomplete  Wound or Superficial Culture     Status: None   Collection Time: 02/09/20  3:55 AM   Specimen: Arm; Wound  Result Value Ref Range Status   Specimen Description   Final    ARM LEFT FOREARM Performed at [facility], [address] [place], [place] [address]    Special Requests   Final    NONE Performed at [facility], [address] [place], [place] [address]    Gram Stain   Final    FEW WBC PRESENT, PREDOMINANTLY PMN RARE GRAM POSITIVE RODS RARE GRAM POSITIVE COCCI Performed at [facility], [place] [address] [place], [place] [address]    Culture FEW SERRATIA MARCESCENS  Final   Report Status 02/11/2020 FINAL  Final   Organism ID, Bacteria SERRATIA MARCESCENS  Final       Susceptibility   Serratia marcescens - MIC*    CEFAZOLIN >=64 RESISTANT Resistant     CEFEPIME <=0.12 SENSITIVE Sensitive     CEFTAZIDIME <=1 SENSITIVE Sensitive     CEFTRIAXONE <=0.25 SENSITIVE Sensitive     CIPROFLOXACIN <=0.25 SENSITIVE Sensitive     GENTAMICIN <=1 SENSITIVE Sensitive     TRIMETH/SULFA <=20 SENSITIVE Sensitive     * FEW SERRATIA MARCESCENS  Blood Culture ID Panel (Reflexed)     Status: Abnormal   Collection Time: 02/09/20  3:55 AM  Result Value Ref Range Status   Enterococcus faecalis NOT DETECTED NOT DETECTED Final   Enterococcus Faecium NOT DETECTED NOT DETECTED Final   Listeria monocytogenes NOT DETECTED NOT DETECTED Final   Staphylococcus species NOT DETECTED NOT DETECTED Final   Staphylococcus aureus (BCID) NOT DETECTED NOT DETECTED Final   Staphylococcus epidermidis NOT DETECTED NOT DETECTED Final   Staphylococcus lugdunensis NOT DETECTED NOT DETECTED Final   Streptococcus species NOT DETECTED NOT DETECTED Final   Streptococcus agalactiae NOT DETECTED NOT DETECTED Final   Streptococcus pneumoniae NOT DETECTED NOT DETECTED Final   Streptococcus pyogenes NOT DETECTED NOT DETECTED Final   A.calcoaceticus-baumannii NOT DETECTED NOT DETECTED Final   Bacteroides fragilis NOT DETECTED NOT DETECTED Final   Enterobacterales DETECTED (A) NOT DETECTED Final    Comment: Enterobacterales represent a large order of gram negative bacteria, not a single organism. CRITICAL RESULT CALLED TO, READ BACK BY AND VERIFIED WITH: JASON ROBBINS @0057  02/10/20 RH    Enterobacter cloacae complex NOT DETECTED NOT DETECTED Final   Escherichia coli NOT DETECTED NOT DETECTED Final   Klebsiella aerogenes NOT DETECTED NOT DETECTED Final   Klebsiella oxytoca NOT DETECTED NOT DETECTED Final   Klebsiella pneumoniae NOT  DETECTED NOT DETECTED Final   Proteus species NOT DETECTED NOT DETECTED Final   Salmonella species NOT DETECTED NOT DETECTED Final   Serratia marcescens DETECTED (A) NOT  DETECTED Final    Comment: CRITICAL RESULT CALLED TO, READ BACK BY AND VERIFIED WITH: JASON ROBBINS @ 2409 02/10/2020 RH    Haemophilus influenzae NOT DETECTED NOT DETECTED Final   Neisseria meningitidis NOT DETECTED NOT DETECTED Final   Pseudomonas aeruginosa NOT DETECTED NOT DETECTED Final   Stenotrophomonas maltophilia NOT DETECTED NOT DETECTED Final   Candida albicans NOT DETECTED NOT DETECTED Final   Candida auris NOT DETECTED NOT DETECTED Final   Candida glabrata NOT DETECTED NOT DETECTED Final   Candida krusei NOT DETECTED NOT DETECTED Final   Candida parapsilosis NOT DETECTED NOT DETECTED Final   Candida tropicalis NOT DETECTED NOT DETECTED Final   Cryptococcus neoformans/gattii NOT DETECTED NOT DETECTED Final   CTX-M ESBL NOT DETECTED NOT DETECTED Final   Carbapenem resistance IMP NOT DETECTED NOT DETECTED Final   Carbapenem resistance KPC NOT DETECTED NOT DETECTED Final   Carbapenem resistance NDM NOT DETECTED NOT DETECTED Final   Carbapenem resist OXA 48 LIKE NOT DETECTED NOT DETECTED Final   Carbapenem resistance VIM NOT DETECTED NOT DETECTED Final    Comment: Performed at [facility], [address] [place], [place] [address]  Aerobic/Anaerobic Culture (surgical/deep wound)     Status: None (Preliminary result)   Collection Time: 02/09/20 10:48 AM   Specimen: Abscess  Result Value Ref Range Status   Specimen Description ABSCESS LEFT ARM  Final   Special Requests NONE  Final   Gram Stain   Final    MODERATE WBC PRESENT,BOTH PMN AND MONONUCLEAR MODERATE GRAM POSITIVE RODS RARE GRAM NEGATIVE RODS RARE YEAST Performed at [facility], [address], [place], [place] [address]    Culture   Final    FEW SERRATIA MARCESCENS FEW CANDIDA ALBICANS NO ANAEROBES ISOLATED; CULTURE IN PROGRESS FOR 5 DAYS    Report Status PENDING  Incomplete   Organism ID, Bacteria SERRATIA MARCESCENS  Final      Susceptibility   Serratia marcescens - MIC*    CEFAZOLIN >=64  RESISTANT Resistant     CEFEPIME <=0.12 SENSITIVE Sensitive     CEFTAZIDIME <=1 SENSITIVE Sensitive     CEFTRIAXONE <=0.25 SENSITIVE Sensitive     CIPROFLOXACIN <=0.25 SENSITIVE Sensitive     GENTAMICIN <=1 SENSITIVE Sensitive     TRIMETH/SULFA <=20 SENSITIVE Sensitive     * FEW SERRATIA MARCESCENS     RN Pressure Injury Documentation:     Estimated body mass index is 20.48 kg/m as calculated from the following:   Height as of this encounter: 5' 8"  (1.727 m).   Weight as of this encounter: 61.1 kg.  Malnutrition Type:  Nutrition Problem: Severe Malnutrition Etiology: social / environmental circumstances (IV drug use, homelessness, food insecurity)   Malnutrition Characteristics:  Signs/Symptoms: severe fat depletion, moderate muscle depletion, severe muscle depletion   Nutrition Interventions:  Interventions: Refer to RD note for recommendations   Radiology Studies: No results found.  Scheduled Meds: . acidophilus  2 capsule Oral TID  . amLODipine  10 mg Oral Daily  . enoxaparin (LOVENOX) injection  40 mg Subcutaneous Q24H  . gabapentin  600 mg Oral TID  . haloperidol lactate  0.5 mg Intramuscular TID   Or  . haloperidol  0.5 mg Oral TID  . insulin aspart  0-15 Units Subcutaneous TID WC  . insulin aspart protamine-  aspart  18 Units Subcutaneous BID WC  . insulin starter kit- pen needles  1 kit Other Once  . multivitamin with minerals  1 tablet Oral Daily  . Ensure Max Protein  11 oz Oral BID BM  . sodium chloride flush  10-40 mL Intracatheter Q12H   Continuous Infusions: . sodium chloride 250 mL (02/11/20 1720)  . ceFEPime (MAXIPIME) IV 2 g (02/11/20 1724)  . lactated ringers Stopped (02/09/20 1308)  . magnesium sulfate bolus IVPB 1 g (02/11/20 1802)  . potassium PHOSPHATE IVPB (in mmol)      LOS: 2 days   Kerney Elbe, DO [facility] PAGER is on [place]  If 7PM-7AM, please contact night-coverage [email or web address]

## 2020-02-12 ENCOUNTER — Telehealth: Payer: Self-pay | Admitting: Gerontology

## 2020-02-12 DIAGNOSIS — E1100 Type 2 diabetes mellitus with hyperosmolarity without nonketotic hyperglycemic-hyperosmolar coma (NKHHC): Secondary | ICD-10-CM

## 2020-02-12 DIAGNOSIS — F1910 Other psychoactive substance abuse, uncomplicated: Secondary | ICD-10-CM

## 2020-02-12 DIAGNOSIS — L0291 Cutaneous abscess, unspecified: Secondary | ICD-10-CM

## 2020-02-12 LAB — COMPREHENSIVE METABOLIC PANEL
ALT: 23 U/L (ref 0–44)
AST: 32 U/L (ref 15–41)
Albumin: 2.8 g/dL — ABNORMAL LOW (ref 3.5–5.0)
Alkaline Phosphatase: 96 U/L (ref 38–126)
Anion gap: 6 (ref 5–15)
BUN: 14 mg/dL (ref 6–20)
CO2: 25 mmol/L (ref 22–32)
Calcium: 8.7 mg/dL — ABNORMAL LOW (ref 8.9–10.3)
Chloride: 97 mmol/L — ABNORMAL LOW (ref 98–111)
Creatinine, Ser: 0.59 mg/dL — ABNORMAL LOW (ref 0.61–1.24)
GFR calc Af Amer: >60 mL/min (ref >60)
GFR calc non Af Amer: >60 mL/min (ref >60)
Glucose, Bld: 522 mg/dL (ref 70–99)
Potassium: 3.8 mmol/L (ref 3.5–5.1)
Sodium: 128 mmol/L — ABNORMAL LOW (ref 135–145)
Total Bilirubin: 0.4 mg/dL (ref 0.3–1.2)
Total Protein: 6.4 g/dL — ABNORMAL LOW (ref 6.5–8.1)

## 2020-02-12 LAB — CBC WITH DIFFERENTIAL/PLATELET
Abs Immature Granulocytes: 0.02 10*3/uL (ref 0.00–0.07)
Basophils Absolute: 0.1 10*3/uL (ref 0.0–0.1)
Basophils Relative: 1 %
Eosinophils Absolute: 0.2 10*3/uL (ref 0.0–0.5)
Eosinophils Relative: 3 %
HCT: 38.3 % — ABNORMAL LOW (ref 39.0–52.0)
Hemoglobin: 13.1 g/dL (ref 13.0–17.0)
Immature Granulocytes: 0 %
Lymphocytes Relative: 25 %
Lymphs Abs: 1.4 10*3/uL (ref 0.7–4.0)
MCH: 30.5 pg (ref 26.0–34.0)
MCHC: 34.2 g/dL (ref 30.0–36.0)
MCV: 89.1 fL (ref 80.0–100.0)
Monocytes Absolute: 0.3 10*3/uL (ref 0.1–1.0)
Monocytes Relative: 5 %
Neutro Abs: 3.7 10*3/uL (ref 1.7–7.7)
Neutrophils Relative %: 66 %
Platelets: 230 10*3/uL (ref 150–400)
RBC: 4.3 MIL/uL (ref 4.22–5.81)
RDW: 12.6 % (ref 11.5–15.5)
WBC: 5.6 10*3/uL (ref 4.0–10.5)
nRBC: 0 % (ref 0.0–0.2)

## 2020-02-12 LAB — MAGNESIUM: Magnesium: 1.7 mg/dL (ref 1.7–2.4)

## 2020-02-12 LAB — GLUCOSE, CAPILLARY
Glucose-Capillary: 487 mg/dL — ABNORMAL HIGH (ref 70–99)
Glucose-Capillary: 535 mg/dL (ref 70–99)
Glucose-Capillary: 459 mg/dL — ABNORMAL HIGH (ref 70–99)
Glucose-Capillary: 352 mg/dL — ABNORMAL HIGH (ref 70–99)
Glucose-Capillary: 261 mg/dL — ABNORMAL HIGH (ref 70–99)
Glucose-Capillary: 219 mg/dL — ABNORMAL HIGH (ref 70–99)

## 2020-02-12 LAB — PHOSPHORUS: Phosphorus: 3.5 mg/dL (ref 2.5–4.6)

## 2020-02-12 MED ORDER — FLUCONAZOLE 100 MG PO TABS
200.0000 mg | ORAL_TABLET | Freq: Every day | ORAL | Status: DC
  Administered 2020-02-13 – 2020-02-14 (×2): 200 mg via ORAL
  Filled 2020-02-12 (×2): qty 2

## 2020-02-12 MED ORDER — INSULIN ASPART 100 UNIT/ML ~~LOC~~ SOLN
0.0000 [IU] | Freq: Every day | SUBCUTANEOUS | Status: DC
  Administered 2020-02-12: 2 [IU] via SUBCUTANEOUS
  Filled 2020-02-12: qty 1

## 2020-02-12 MED ORDER — HYDROCORTISONE 1 % EX CREA
TOPICAL_CREAM | Freq: Two times a day (BID) | CUTANEOUS | Status: DC
  Administered 2020-02-12: 1 via TOPICAL
  Filled 2020-02-12: qty 28

## 2020-02-12 MED ORDER — INSULIN ASPART PROT & ASPART (70-30 MIX) 100 UNIT/ML ~~LOC~~ SUSP
25.0000 [IU] | Freq: Two times a day (BID) | SUBCUTANEOUS | Status: DC
  Administered 2020-02-12 – 2020-02-13 (×3): 25 [IU] via SUBCUTANEOUS
  Filled 2020-02-12 (×3): qty 10

## 2020-02-12 MED ORDER — LEVOFLOXACIN 750 MG PO TABS
750.0000 mg | ORAL_TABLET | Freq: Every day | ORAL | Status: DC
  Administered 2020-02-12 – 2020-02-14 (×3): 750 mg via ORAL
  Filled 2020-02-12 (×3): qty 1

## 2020-02-12 MED ORDER — DIPHENHYDRAMINE HCL 50 MG/ML IJ SOLN: 12.5000 mg | Freq: Four times a day (QID) | INTRAMUSCULAR | Status: DC | PRN

## 2020-02-12 MED ORDER — MAGNESIUM SULFATE 2 GM/50ML IV SOLN
2.0000 g | Freq: Once | INTRAVENOUS | Status: AC
  Administered 2020-02-12: 2 g via INTRAVENOUS
  Filled 2020-02-12: qty 50

## 2020-02-12 MED ORDER — FLUCONAZOLE 100 MG PO TABS
400.0000 mg | ORAL_TABLET | Freq: Once | ORAL | Status: AC
  Administered 2020-02-12: 400 mg via ORAL
  Filled 2020-02-12: qty 4
  Filled 2020-02-12: qty 2

## 2020-02-12 NOTE — Progress Notes (Addendum)
Inpatient Diabetes Program Recommendations  AACE/ADA: New Consensus Statement on Inpatient Glycemic Control   Target Ranges:  Prepandial:   less than 140 mg/dL      Peak postprandial:   less than 180 mg/dL (1-2 hours)      Critically ill patients:  140 - 180 mg/dL  Results for Earl Moreno, Earl Moreno (MRN [id number]) as of 02/12/2020 07:16  Ref. Range 02/12/2020 07:42  Glucose-Capillary Latest Ref Range: 70 - 99 mg/dL 782 (H)   Results for Earl Moreno, Earl Moreno (MRN [id number]) as of 02/12/2020 07:16  Ref. Range 02/11/2020 07:44 02/11/2020 11:51 02/11/2020 16:36 02/11/2020 20:41  Glucose-Capillary Latest Ref Range: 70 - 99 mg/dL 315 (H) 400 (H) 867 (H) 321 (H)  Results for Earl Moreno, Earl Moreno (MRN [id number]) as of 02/12/2020 07:16  Ref. Range 02/09/2020 03:55  Hemoglobin A1C Latest Ref Range: 4.8 - 5.6 % >15.5 (H)   Review of Glycemic Control  Diabetes history:NO Outpatient Diabetes medications:NA Current orders for Inpatient glycemic control:70/30 18 units BID, Novolog 0-15 units TID with meals, Metformin 1000 mg BID  Inpatient Diabetes Program Recommendations:  Insulin: Please consider increasing 70/30 to 25 units BID with breakfast and supper (dose will provide a total of 35 units for basal and 15 units for meal coverage per day) and adding Novolog 0-5 units QHS for bedtime correction.   HbgA1C:  A1C >15.5% on 02/09/20 indicating an average glucose of greater than 398 mg/dl over the past 2-3 months. At time of discharge, please provide Rx for: Humalog Kwikpen 75/25 [phone]) and insulin pen needles [phone]).  Thanks, [name], RN, MSN, CDE Diabetes Coordinator Inpatient Diabetes Program [phone] (Team Pager from 8am to 5pm)

## 2020-02-12 NOTE — Progress Notes (Signed)
PROGRESS NOTE    Earl Moreno  EPP:[id number] DOB: 1966-04-04 DOA: 02/09/2020 PCP: Patient, No Pcp Per   Brief Narrative:  HPI per Dr. Royce Macadamia Agbata on 02/09/20 Earl Moreno is a 54 y.o. male with medical history significant for IV drug use (patient admits to being a heroin addict) and uses intranasal cocaine as well.  He was brought into the emergency room by his wife for evaluation of multiple abscesses involving both left and right wrist as well as left forearm.  He has had these abscesses for about a week and the one on his left forearm appears to be draining.  His wife states that he has had about a 30 pound weight loss over the last couple of weeks associated with increasing thirst, urinary frequency and worsening fatigue.  He denies having any fever chills, he has no shortness of breath, no abdominal pain, no chest pain, no changes in his bowel habits or any urinary symptoms. Patient has a strong family history of diabetes mellitus His last IV drug use was 1 day prior to his admission. Labs show sodium 124, potassium 4.2, chloride 85, bicarb 26, serum glucose 828, BUN 9, creatinine 0.79, calcium 9.3, albumin 3.7, AST 21, ALT 23, white count 9.4, hemoglobin 15.6, hematocrit 43.7, MCV 84.7, RDW 12.5, platelet count 288 Chest x-ray reviewed by me shows no infiltrate or effusion Left forearm x-ray shows no soft tissue gas, retained needle, or bony erosion. Twelve-lead EKG reviewed by me shows sinus tachycardia   ED Course: Patient is a 54 year old Caucasian male who is a heroin addict and who presented to the ER for evaluation of multiple abscesses on both wrist as well as his left forearm.  He also gives a history of significant weight loss, increasing thirst, urinary frequency and increased fatigue.  His labs revealed a glucose of greater than 800 and normal serum bicarbonate level.  Patient was started on aggressive IV fluid hydration and received a dose of IV vancomycin.  He is  currently on regular insulin infusion will be admitted to the hospital for further evaluation.  **Interim History General surgery was consulted and he had an incision and drainage of the abscess of the left forearm.  Initial blood cultures were growing  Serratia marcescens and wound culture from 9/20 started growing gram-positive rods and gram-negative rods and antibiotics were switched to cefepime.  Patient threatened to leave AMA the day before yesterday and was very belligerent with staff and refused labs yesterday morning but He was finally agreeable to obtain labs. He has a lactic acidosis now and we have discontinued Metformin given him normal saline bolus and maintenance IV fluids.  Patient continues to ask for increase in his pain medication stating that he is a heroin addict and that he is getting "dope sick" and feels like he is hallucinating however states that he appears comfortable in no acute distress.  We will continue his current pain regimen at this time as well as his Toradol and have to be judicious with his IV narcotics given his IV drug abuse.  Culture sensitivities are still pending however the likely Gram stain from the wound culture and so we will change him over to IV Levaquin in anticipation of discharge.  Given that has wound Gram stain and culture show some Candida albicans we will also give him some fluconazole 400 mg times once and then will continue for 4 more days p.o.  Assessment & Plan:   Principal Problem:   Hyperglycemia due  to type 2 diabetes mellitus ([place]) Active Problems:   New onset type 2 diabetes mellitus ([place])   Hyperglycemia due to diabetes mellitus ([place])   Abscess   Heroin addiction ([place])   Hyponatremia   Essential hypertension   Protein-calorie malnutrition, severe  Hyperglycemia due to type 2 diabetes mellitus NEW onset diabetes mellitus type 2 -Anion gap closed at 12, closed, DKA ruled out -Patient appears to have new onset diabetes mellitus and  presents with significant weight loss, increased thirst, urinary frequency and fatigue -Initialblood sugar was greater than 800g/dl CBG's are improving however unfortunately did not receive any sliding scale last night and blood sugars this morning were elevated and went over 500 and was 535.  Now CBGs are ranging from 257-352 and will continue to adjust his medications appropriately -Continue aggressive IV fluid hydration; resumed IV fluid hydration given his lactic acidosis -Continued insulin drip >>> improving anticipating discontinuing drip -Initiating long-acting insulin 20 units nightly this is a change at 70/30 and will need to be changed to 75/25 in the outpatient setting, SSI coverage -We will initiate Metformin however it caused a lactic acidosis and will stop now -A1c:  Greater than 15.5 -Patient will require diabetic education and nutrition evaluation -CBG's ranging from 234-337 yesterday and have been a little bit higher today so we have added sliding scale nightly and increased his 70/30 from 18 units to 25 units twice daily  Bacteremia / multiple Abscesses  SIRS/sepsis ruled out -1 out of 2 blood cultures growing Serratia marcescens susceptibilities to follow still; wound culture growing out Serratia marcescens which is relatively pansensitive -Patient is initiated on IV antibiotics of cefepime -Doxycycline for possible MRSA coverage now discontinued -Repeating blood cultures in the morning of 02/11/2020 and this is fine no growth to date -Patient with skin/soft tissue infection involving both wrists and left forearm -General surgery consult following, continue wound care, no plan for I&D at this time -Continue local wound care daily + prn; Pack with iodoform gauze, cover, secure with tape-->  -Appreciate surgery evaluation and he will need at least 10 to 14 days of antibiotics when sensitivities are obtained -General surgery recommending continuing daily wound care as outlined  and de-escalating antibiotics per culture sensitivities and will see him in the outpatient setting if necessary -Awaiting on final sensitivities for his blood cultures but likely they are the same as his wound culture.  His wound culture also did grow out some Candida albicans so we will give him some fluconazole p.o. -We will de-escalate IV Levaquin today -Complaining of itching near abscess sites and will add Hydrocortisone 1% cream Topically Daily and Diphenhydramine 12.5 mg IV q6hprn Itching   Hypertension - Uncontrolled -We will start patient on amlodipine 10 mg daily -Monitoring very closely, titrating meds, as needed hydralazine -Continue to Monitor BP per Protocol -Last BP reading was 142/95  Hyponatremia -Transient secondary to significant hyperglycemia -Monitoring sodium level closely, improving with IV fluid hydration and correction of hyperglycemia -Secondary to significant hyperglycemia -Sodium this morning is back down again to 128 given that his blood sugar was 522 -Patient's sodium corrected for his glucose is 135 with the Caryl Comes and is 138 with the Hiller equation -Continue to monitor and repeat BMP in a.m.  Polysubstance dependence -Patient is urine drug screen positive for heroine/cocaine/opiates -Patient has been counseled on the need to abstain from illicit drug use -Judicious use of opiates given his IV drug abuse past -C/w Nicotine Polacrilex 2 mg po PRN smoking Cessation  Hepatitis C -Continue monitoring LFTs -Hepatitis C reactive, quantitive RNA hepatitis C ordered -Likely need to follow-up as an outpatient with infectious disease  Lactic acidosis -Lactic acid level has trended all the way to 4.2 yesterday -Given a liter of normal saline bolus -We will stop his Metformin -Start maintenance IV fluids at 75 MLS per hour and will continue through today and stop tomorrow -Continue monitor and trend lactic acid levels if patient allows and repeat in  the AM  Hypophosphatemia -Patient's phosphorus level was 2.0 and replete with IV K-Phos 20 mmol yesterday and is now improved to 3.5 -Continue monitor and replete as necessary  Severe protein calorie malnutrition -In the setting of his heroin abuse -Nutritionist consulted for further evaluation and recommendations and recommending continuing supplements -C/w Ensure Maxx 11 ounce po BID   DVT prophylaxis: Enoxaparin 40 g subcu every 24 Code Status: FULL CODE  Family Communication: No family at bedside  Disposition Plan: Pending further improvement of blood sugars as well as evaluation and treatment of his gram-negative bacteremia  Status is: Inpatient  Remains inpatient appropriate because:Ongoing diagnostic testing needed not appropriate for outpatient work up and Inpatient level of care appropriate due to severity of illness   Dispo: The patient is from: Home              Anticipated d/c is to: Home               Anticipated d/c date is: 2 days              Patient currently is not medically stable to d/c.  Consultants:   General Surgery   Procedures: I and D by General Surgery on 02/09/20  Antimicrobials:  Anti-infectives (From admission, onward)   Start     Dose/Rate Route Frequency Ordered Stop   02/13/20 1000  fluconazole (DIFLUCAN) tablet 200 mg        200 mg Oral Daily 02/12/20 1010 02/17/20 0959   02/12/20 1400  fluconazole (DIFLUCAN) tablet 400 mg        400 mg Oral  Once 02/12/20 1010 02/12/20 1400   02/12/20 1400  levofloxacin (LEVAQUIN) tablet 750 mg        750 mg Oral Daily 02/12/20 1010 02/20/20 0959   02/10/20 0130  ceFEPIme (MAXIPIME) 2 g in sodium chloride 0.9 % 100 mL IVPB  Status:  Discontinued        2 g 200 mL/hr over 30 Minutes Intravenous Every 8 hours 02/10/20 0116 02/12/20 1010   02/10/20 0000  doxycycline (VIBRAMYCIN) 50 MG capsule  Status:  Discontinued        100 mg Oral 2 times daily 02/10/20 1505 02/12/20    02/09/20 1000  doxycycline  (VIBRA-TABS) tablet 100 mg  Status:  Discontinued        100 mg Oral Every 12 hours 02/09/20 0824 02/10/20 0115   02/09/20 0415  vancomycin (VANCOCIN) IVPB 1000 mg/200 mL premix        1,000 mg 200 mL/hr over 60 Minutes Intravenous  Once 02/09/20 0401 02/09/20 0830        Subjective: And examined at bedside and he states that he is feeling better today.  States that he had a good night and finally had a bowel movement and states that he has had 4 bowel movements since yesterday and does not feel is constipated.  No nausea or vomiting.  Denies any chest pain, lightheadedness or dizziness.  Only complains of some itching near his abscess  sites and states that his pain is fairly well-controlled today.  No other concerns or complaints at this time.  Objective: Vitals:   02/11/20 0746 02/11/20 1619 02/11/20 2343 02/12/20 0744  BP: 139/89 110/76 118/83 (!) 142/95  Pulse: 61 73 63 (!) 57  Resp: _0 Temp: (!) 97.5 F (36.4 C) 98.1 F (36.7 C) 97.9 F (36.6 C) 97.8 F (36.6 C)  TempSrc: Oral Oral Oral   SpO2: 99% 95% 98% 98%  Weight:      Height:        Intake/Output Summary (Last 24 hours) at 02/12/2020 1537 Last data filed at 02/12/2020 1422 Gross per 24 hour  Intake 2132.42 ml  Output 2225 ml  Net -92.58 ml   Filed Weights   02/08/20 2048 02/09/20 2026  Weight: 59 kg 61.1 kg   Examination: Physical Exam:  Constitutional: Patient is a thin chronically ill-appearing disheveled Caucasian male currently in no acute distress appears calm and comfortable today. Eyes: Lids and conjunctivae normal, sclerae anicteric  ENMT: External Ears, Nose appear normal. Grossly normal hearing.  Neck: Appears normal, supple, no cervical masses, normal ROM, no appreciable thyromegaly; no JVD Respiratory: Diminished to auscultation bilaterally, no wheezing, rales, rhonchi or crackles. Normal respiratory effort and patient is not tachypenic. No accessory muscle use.  Unlabored  breathing Cardiovascular: RRR, no murmurs / rubs / gallops. S1 and S2 auscultated. No extremity edema. 2+ pedal pulses. No carotid bruits.  Abdomen: Soft, non-tender, non-distended.  Bowel sounds positive.  GU: Deferred. Musculoskeletal: No clubbing / cyanosis of digits/nails. No joint deformity upper and lower extremities, however left arm is wrapped where he had his I&D.  Has multiple abscesses on his hand Skin: He has tattoos diffusely scattered throughout his body and did have some multiple skin lesions from where he was injected himself with heroin.  Also has multiple abscesses induration on his hand and left arm is wrapped.. No induration; Warm and dry.  Neurologic: CN 2-12 grossly intact with no focal deficits. Romberg sign and cerebellar reflexes not assessed.  Psychiatric: Normal judgment and insight. Alert and oriented x 3. Normal mood and appropriate affect.   Data Reviewed: I have personally reviewed following labs and imaging studies  CBC: Recent Labs  Lab 02/08/20 2129 02/11/20 1317 02/12/20 0917  WBC 9.4 8.3 5.6  NEUTROABS 6.5 6.1 3.7  HGB 15.6 16.4 13.1  HCT 43.7 47.7 38.3*  MCV 84.7 87.8 89.1  PLT 288 318 683   Basic Metabolic Panel: Recent Labs  Lab 02/08/20 2129 02/09/20 0345 02/10/20 1324 02/11/20 1317 02/12/20 0917  NA 124* 132* 134* 136 128*  K 4.2 3.8 3.2* 3.5 3.8  CL 85* 94* 98 100 97*  CO2 _1 GLUCOSE 828* 519* 279* 227* 522*  BUN _2 CREATININE 0.79 0.62 0.55* 0.58* 0.59*  CALCIUM 9.3 8.9 8.8* 9.7 8.7*  MG  --   --   --  1.7 1.7  PHOS  --   --   --  2.0* 3.5   GFR: Estimated Creatinine Clearance: 90.2 mL/min (A) (by C-G formula based on SCr of 0.59 mg/dL (L)). Liver Function Tests: Recent Labs  Lab 02/08/20 2129 02/11/20 1317 02/12/20 0917  AST 21 34 32  ALT _3 ALKPHOS 128* 92 96  BILITOT 0.9 0.5 0.4  PROT 8.5* 8.3* 6.4*  ALBUMIN 3.7 3.4* 2.8*   No results for input(s): LIPASE, AMYLASE in the last 168  hours. No results for input(s): AMMONIA in the last 168 hours. Coagulation Profile: No results for input(s): INR, PROTIME in the last 168 hours. Cardiac Enzymes: No results for input(s): CKTOTAL, CKMB, CKMBINDEX, TROPONINI in the last 168 hours. BNP (last 3 results) No results for input(s): PROBNP in the last 8760 hours. HbA1C: No results for input(s): HGBA1C in the last 72 hours. CBG: Recent Labs  Lab 02/11/20 2041 02/12/20 0742 02/12/20 0905 02/12/20 1001 02/12/20 1132  GLUCAP 321* 487* 535* 459* 352*   Lipid Profile: No results for input(s): CHOL, HDL, LDLCALC, TRIG, CHOLHDL, LDLDIRECT in the last 72 hours. Thyroid Function Tests: No results for input(s): TSH, T4TOTAL, FREET4, T3FREE, THYROIDAB in the last 72 hours. Anemia Panel: No results for input(s): VITAMINB12, FOLATE, FERRITIN, TIBC, IRON, RETICCTPCT in the last 72 hours. Sepsis Labs: Recent Labs  Lab 02/10/20 1324 02/11/20 1317  PROCALCITON 0.10  --   LATICACIDVEN 2.7* 4.2*    Recent Results (from the past 240 hour(s))  SARS Coronavirus 2 by RT PCR (hospital order, performed in [facility] hospital lab) Nasopharyngeal Nasopharyngeal Swab     Status: None   Collection Time: 02/09/20  3:45 AM   Specimen: Nasopharyngeal Swab  Result Value Ref Range Status   SARS Coronavirus 2 NEGATIVE NEGATIVE Final    Comment: (NOTE) SARS-CoV-2 target nucleic acids are NOT DETECTED.  The SARS-CoV-2 RNA is generally detectable in upper and lower respiratory specimens during the acute phase of infection. The lowest concentration of SARS-CoV-2 viral copies this assay can detect is 250 copies / mL. A negative result does not preclude SARS-CoV-2 infection and should not be used as the sole basis for treatment or other patient management decisions.  A negative result may occur with improper specimen collection / handling, submission of specimen other than nasopharyngeal swab, presence of viral mutation(s) within the areas  targeted by this assay, and inadequate number of viral copies (<250 copies / mL). A negative result must be combined with clinical observations, patient history, and epidemiological information.  Fact Sheet for Patients:   StrictlyIdeas.no  Fact Sheet for Healthcare Providers: BankingDealers.co.za  This test is not yet approved or  cleared by the [place] FDA and has been authorized for detection and/or diagnosis of SARS-CoV-2 by FDA under an Emergency Use Authorization (EUA).  This EUA will remain in effect (meaning this test can be used) for the duration of the COVID-19 declaration under Section 564(b)(1) of the Act, 21 U.S.C. section 360bbb-3(b)(1), unless the authorization is terminated or revoked sooner.  Performed at [facility], [address] [place], [place] [address]   Blood culture (routine x 2)     Status: Abnormal (Preliminary result)   Collection Time: 02/09/20  3:55 AM   Specimen: BLOOD  Result Value Ref Range Status   Specimen Description   Final    BLOOD RIGHT ARM Performed at [facility], [address], [place], [place] [address]    Special Requests   Final    BOTTLES DRAWN AEROBIC AND ANAEROBIC Blood Culture results may not be optimal due to an inadequate volume of blood received in culture bottles Performed at [facility] [address] [place], [place] [address]    Culture  Setup Time   Final    GRAM NEGATIVE RODS ANAEROBIC BOTTLE ONLY Organism ID to follow CRITICAL RESULT CALLED TO, READ BACK BY AND VERIFIED WITHVioleta Gelinas @ 0057 02/10/20 RH Performed at [facility], [address], [place], [place] [address]  Culture (A)  Final    SERRATIA MARCESCENS SUSCEPTIBILITIES TO FOLLOW Performed at [facility], [place] [address], [place], [place] [address]    Report Status PENDING  Incomplete  Wound or Superficial Culture     Status: None    Collection Time: 02/09/20  3:55 AM   Specimen: Arm; Wound  Result Value Ref Range Status   Specimen Description   Final    ARM LEFT FOREARM Performed at [facility], [address], [place], [place] [address]    Special Requests   Final    NONE Performed at [facility], [address] [place], [place] [address]    Gram Stain   Final    FEW WBC PRESENT, PREDOMINANTLY PMN RARE GRAM POSITIVE RODS RARE GRAM POSITIVE COCCI Performed at [facility], [place] [address], [place], [place] [address]    Culture FEW SERRATIA MARCESCENS  Final   Report Status 02/11/2020 FINAL  Final   Organism ID, Bacteria SERRATIA MARCESCENS  Final      Susceptibility   Serratia marcescens - MIC*    CEFAZOLIN >=64 RESISTANT Resistant     CEFEPIME <=0.12 SENSITIVE Sensitive     CEFTAZIDIME <=1 SENSITIVE Sensitive     CEFTRIAXONE <=0.25 SENSITIVE Sensitive     CIPROFLOXACIN <=0.25 SENSITIVE Sensitive     GENTAMICIN <=1 SENSITIVE Sensitive     TRIMETH/SULFA <=20 SENSITIVE Sensitive     * FEW SERRATIA MARCESCENS  Blood Culture ID Panel (Reflexed)     Status: Abnormal   Collection Time: 02/09/20  3:55 AM  Result Value Ref Range Status   Enterococcus faecalis NOT DETECTED NOT DETECTED Final   Enterococcus Faecium NOT DETECTED NOT DETECTED Final   Listeria monocytogenes NOT DETECTED NOT DETECTED Final   Staphylococcus species NOT DETECTED NOT DETECTED Final   Staphylococcus aureus (BCID) NOT DETECTED NOT DETECTED Final   Staphylococcus epidermidis NOT DETECTED NOT DETECTED Final   Staphylococcus lugdunensis NOT DETECTED NOT DETECTED Final   Streptococcus species NOT DETECTED NOT DETECTED Final   Streptococcus agalactiae NOT DETECTED NOT DETECTED Final   Streptococcus pneumoniae NOT DETECTED NOT DETECTED Final   Streptococcus pyogenes NOT DETECTED NOT DETECTED Final   A.calcoaceticus-baumannii NOT DETECTED NOT DETECTED Final   Bacteroides fragilis NOT DETECTED NOT DETECTED Final    Enterobacterales DETECTED (A) NOT DETECTED Final    Comment: Enterobacterales represent a large order of gram negative bacteria, not a single organism. CRITICAL RESULT CALLED TO, READ BACK BY AND VERIFIED WITH: JASON ROBBINS $[email or web address]$  02/10/20 RH    Enterobacter cloacae complex NOT DETECTED NOT DETECTED Final   Escherichia coli NOT DETECTED NOT DETECTED Final   Klebsiella aerogenes NOT DETECTED NOT DETECTED Final   Klebsiella oxytoca NOT DETECTED NOT DETECTED Final   Klebsiella pneumoniae NOT DETECTED NOT DETECTED Final   Proteus species NOT DETECTED NOT DETECTED Final   Salmonella species NOT DETECTED NOT DETECTED Final   Serratia marcescens DETECTED (A) NOT DETECTED Final    Comment: CRITICAL RESULT CALLED TO, READ BACK BY AND VERIFIED WITH: JASON ROBBINS @ 3790 02/10/2020 RH    Haemophilus influenzae NOT DETECTED NOT DETECTED Final   Neisseria meningitidis NOT DETECTED NOT DETECTED Final   Pseudomonas aeruginosa NOT DETECTED NOT DETECTED Final   Stenotrophomonas maltophilia NOT DETECTED NOT DETECTED Final   Candida albicans NOT DETECTED NOT DETECTED Final   Candida auris NOT DETECTED NOT DETECTED Final   Candida glabrata NOT DETECTED NOT DETECTED Final   Candida krusei NOT DETECTED NOT  DETECTED Final   Candida parapsilosis NOT DETECTED NOT DETECTED Final   Candida tropicalis NOT DETECTED NOT DETECTED Final   Cryptococcus neoformans/gattii NOT DETECTED NOT DETECTED Final   CTX-M ESBL NOT DETECTED NOT DETECTED Final   Carbapenem resistance IMP NOT DETECTED NOT DETECTED Final   Carbapenem resistance KPC NOT DETECTED NOT DETECTED Final   Carbapenem resistance NDM NOT DETECTED NOT DETECTED Final   Carbapenem resist OXA 48 LIKE NOT DETECTED NOT DETECTED Final   Carbapenem resistance VIM NOT DETECTED NOT DETECTED Final    Comment: Performed at [facility], [address] [place], [place] [address]  Aerobic/Anaerobic Culture (surgical/deep wound)     Status: None (Preliminary  result)   Collection Time: 02/09/20 10:48 AM   Specimen: Abscess  Result Value Ref Range Status   Specimen Description ABSCESS LEFT ARM  Final   Special Requests NONE  Final   Gram Stain   Final    MODERATE WBC PRESENT,BOTH PMN AND MONONUCLEAR MODERATE GRAM POSITIVE RODS RARE GRAM NEGATIVE RODS RARE YEAST Performed at [facility], [place] [address], [place], [place] [address]    Culture   Final    FEW SERRATIA MARCESCENS FEW CANDIDA ALBICANS NO ANAEROBES ISOLATED; CULTURE IN PROGRESS FOR 5 DAYS    Report Status PENDING  Incomplete   Organism ID, Bacteria SERRATIA MARCESCENS  Final      Susceptibility   Serratia marcescens - MIC*    CEFAZOLIN >=64 RESISTANT Resistant     CEFEPIME <=0.12 SENSITIVE Sensitive     CEFTAZIDIME <=1 SENSITIVE Sensitive     CEFTRIAXONE <=0.25 SENSITIVE Sensitive     CIPROFLOXACIN <=0.25 SENSITIVE Sensitive     GENTAMICIN <=1 SENSITIVE Sensitive     TRIMETH/SULFA <=20 SENSITIVE Sensitive     * FEW SERRATIA MARCESCENS  CULTURE, BLOOD (ROUTINE X 2) w Reflex to ID Panel     Status: None (Preliminary result)   Collection Time: 02/11/20  1:17 PM   Specimen: BLOOD  Result Value Ref Range Status   Specimen Description BLOOD BLOOD LEFT HAND  Final   Special Requests   Final    BOTTLES DRAWN AEROBIC AND ANAEROBIC Blood Culture adequate volume   Culture   Final    NO GROWTH < 24 HOURS Performed at [facility], [address], [place], [place] [address]    Report Status PENDING  Incomplete     RN Pressure Injury Documentation:     Estimated body mass index is 20.48 kg/m as calculated from the following:   Height as of this encounter: $RemoveBeforeD'5\' 8"'[id number]$  (1.727 m).   Weight as of this encounter: 61.1 kg.  Malnutrition Type:  Nutrition Problem: Severe Malnutrition Etiology: social / environmental circumstances (IV drug use, homelessness, food insecurity)   Malnutrition Characteristics:  Signs/Symptoms: severe fat depletion, moderate muscle  depletion, severe muscle depletion   Nutrition Interventions:  Interventions: Refer to RD note for recommendations   Radiology Studies: No results found.  Scheduled Meds: . acidophilus  2 capsule Oral TID  . amLODipine  10 mg Oral Daily  . enoxaparin (LOVENOX) injection  40 mg Subcutaneous Q24H  . [START ON 02/13/2020] fluconazole  200 mg Oral Daily  . gabapentin  600 mg Oral TID  . haloperidol lactate  0.5 mg Intramuscular TID   Or  . haloperidol  0.5 mg Oral TID  . hydrocortisone cream   Topical BID  . insulin aspart  0-15 Units Subcutaneous TID WC  . insulin aspart  0-5 Units Subcutaneous  QHS  . insulin aspart protamine- aspart  25 Units Subcutaneous BID WC  . insulin starter kit- pen needles  1 kit Other Once  . levofloxacin  750 mg Oral Daily  . multivitamin with minerals  1 tablet Oral Daily  . Ensure Max Protein  11 oz Oral BID BM  . sodium chloride flush  10-40 mL Intracatheter Q12H   Continuous Infusions: . sodium chloride 250 mL (02/11/20 1720)  . sodium chloride 75 mL/hr at 02/12/20 0131  . lactated ringers Stopped (02/09/20 1308)    LOS: 3 days   Kerney Elbe, DO [facility] PAGER is on [place]  If 7PM-7AM, please contact night-coverage [email or web address]

## 2020-02-12 NOTE — Telephone Encounter (Signed)
-----   Message from Benjamin Stain, [place] sent at 02/11/2020  8:46 AM EDT ----- Regarding: FW: New Patient referral  ----- Message ----- From: Trenton Founds, RN Sent: 02/11/2020   8:42 AM EDT To: Benjamin Stain, CMA Subject: New Patient referral                           Good Morning,   I provided patient with information as well as [facility] application, I expect he will be reaching out.   Thanks,  [employer]

## 2020-02-13 LAB — COMPREHENSIVE METABOLIC PANEL WITH GFR
ALT: 36 U/L (ref 0–44)
AST: 57 U/L — ABNORMAL HIGH (ref 15–41)
Albumin: 3.2 g/dL — ABNORMAL LOW (ref 3.5–5.0)
Alkaline Phosphatase: 86 U/L (ref 38–126)
Anion gap: 10 (ref 5–15)
BUN: 16 mg/dL (ref 6–20)
CO2: 24 mmol/L (ref 22–32)
Calcium: 8.9 mg/dL (ref 8.9–10.3)
Chloride: 99 mmol/L (ref 98–111)
Creatinine, Ser: 0.61 mg/dL (ref 0.61–1.24)
GFR calc Af Amer: >60 mL/min
GFR calc non Af Amer: >60 mL/min
Glucose, Bld: 406 mg/dL — ABNORMAL HIGH (ref 70–99)
Potassium: 4.1 mmol/L (ref 3.5–5.1)
Sodium: 133 mmol/L — ABNORMAL LOW (ref 135–145)
Total Bilirubin: 0.4 mg/dL (ref 0.3–1.2)
Total Protein: 7.2 g/dL (ref 6.5–8.1)

## 2020-02-13 LAB — GLUCOSE, CAPILLARY
Glucose-Capillary: 408 mg/dL — ABNORMAL HIGH (ref 70–99)
Glucose-Capillary: 458 mg/dL — ABNORMAL HIGH (ref 70–99)
Glucose-Capillary: 166 mg/dL — ABNORMAL HIGH (ref 70–99)
Glucose-Capillary: 111 mg/dL — ABNORMAL HIGH (ref 70–99)

## 2020-02-13 LAB — PHOSPHORUS: Phosphorus: 3.6 mg/dL (ref 2.5–4.6)

## 2020-02-13 LAB — CBC
HCT: 39.2 % (ref 39.0–52.0)
Hemoglobin: 14 g/dL (ref 13.0–17.0)
MCH: 31.1 pg (ref 26.0–34.0)
MCHC: 35.7 g/dL (ref 30.0–36.0)
MCV: 87.1 fL (ref 80.0–100.0)
Platelets: 267 10*3/uL (ref 150–400)
RBC: 4.5 MIL/uL (ref 4.22–5.81)
RDW: 12.5 % (ref 11.5–15.5)
WBC: 6.7 10*3/uL (ref 4.0–10.5)
nRBC: 0 % (ref 0.0–0.2)

## 2020-02-13 LAB — MAGNESIUM: Magnesium: 2.2 mg/dL (ref 1.7–2.4)

## 2020-02-13 LAB — LACTIC ACID, PLASMA: Lactic Acid, Venous: 1.8 mmol/L (ref 0.5–1.9)

## 2020-02-13 MED ORDER — CLONIDINE HCL 0.1 MG PO TABS: 0.1000 mg | ORAL_TABLET | ORAL | Status: DC

## 2020-02-13 MED ORDER — CLONIDINE HCL 0.1 MG PO TABS
0.0500 mg | ORAL_TABLET | Freq: Two times a day (BID) | ORAL | Status: DC
  Administered 2020-02-13 – 2020-02-14 (×2): 0.05 mg via ORAL
  Filled 2020-02-13 (×2): qty 1

## 2020-02-13 MED ORDER — INSULIN ASPART 100 UNIT/ML ~~LOC~~ SOLN
0.0000 [IU] | SUBCUTANEOUS | Status: DC
  Administered 2020-02-13: 20 [IU] via SUBCUTANEOUS
  Administered 2020-02-13 – 2020-02-14 (×3): 4 [IU] via SUBCUTANEOUS
  Administered 2020-02-14 (×2): 11 [IU] via SUBCUTANEOUS
  Filled 2020-02-13 (×6): qty 1

## 2020-02-13 MED ORDER — ENSURE MAX PROTEIN PO LIQD: 11.0000 [oz_av] | Freq: Two times a day (BID) | ORAL | 0 refills | Status: DC

## 2020-02-13 MED ORDER — METHOCARBAMOL 500 MG PO TABS: 500.0000 mg | ORAL_TABLET | Freq: Three times a day (TID) | ORAL | Status: DC | PRN

## 2020-02-13 MED ORDER — CLONIDINE HCL 0.1 MG PO TABS: 0.1000 mg | ORAL_TABLET | Freq: Every day | ORAL | Status: DC

## 2020-02-13 MED ORDER — LEVOFLOXACIN 750 MG PO TABS
750.0000 mg | ORAL_TABLET | Freq: Every day | ORAL | 0 refills | Status: DC
Start: 2020-02-14 — End: 2020-02-14

## 2020-02-13 MED ORDER — DICYCLOMINE HCL 20 MG PO TABS
20.0000 mg | ORAL_TABLET | Freq: Four times a day (QID) | ORAL | Status: DC | PRN
  Filled 2020-02-13: qty 1

## 2020-02-13 MED ORDER — DULOXETINE HCL 20 MG PO CPEP
20.0000 mg | ORAL_CAPSULE | Freq: Every day | ORAL | Status: DC
  Administered 2020-02-13 – 2020-02-14 (×2): 20 mg via ORAL
  Filled 2020-02-13 (×2): qty 1

## 2020-02-13 MED ORDER — INSULIN ASPART 100 UNIT/ML ~~LOC~~ SOLN: 0.0000 [IU] | Freq: Three times a day (TID) | SUBCUTANEOUS | Status: DC

## 2020-02-13 MED ORDER — AMLODIPINE BESYLATE 10 MG PO TABS
10.0000 mg | ORAL_TABLET | Freq: Every day | ORAL | 0 refills | Status: DC
Start: 2020-02-14 — End: 2020-02-14

## 2020-02-13 MED ORDER — INSULIN ASPART 100 UNIT/ML ~~LOC~~ SOLN: 0.0000 [IU] | Freq: Every day | SUBCUTANEOUS | Status: DC

## 2020-02-13 MED ORDER — INSULIN ASPART PROT & ASPART (70-30 MIX) 100 UNIT/ML ~~LOC~~ SUSP
35.0000 [IU] | Freq: Two times a day (BID) | SUBCUTANEOUS | Status: DC
  Administered 2020-02-13 – 2020-02-14 (×2): 35 [IU] via SUBCUTANEOUS
  Filled 2020-02-13 (×2): qty 10

## 2020-02-13 MED ORDER — ADULT MULTIVITAMIN W/MINERALS CH: 1.0000 | ORAL_TABLET | Freq: Every day | ORAL | 0 refills | Status: DC

## 2020-02-13 MED ORDER — TRIHEXYPHENIDYL HCL 2 MG PO TABS
1.0000 mg | ORAL_TABLET | Freq: Two times a day (BID) | ORAL | Status: DC
  Administered 2020-02-14: 1 mg via ORAL
  Filled 2020-02-13 (×3): qty 1

## 2020-02-13 MED ORDER — ONDANSETRON 4 MG PO TBDP
4.0000 mg | ORAL_TABLET | Freq: Four times a day (QID) | ORAL | Status: DC | PRN
  Filled 2020-02-13: qty 1

## 2020-02-13 MED ORDER — LOPERAMIDE HCL 2 MG PO CAPS
2.0000 mg | ORAL_CAPSULE | ORAL | Status: DC | PRN
  Filled 2020-02-13: qty 2

## 2020-02-13 MED ORDER — NAPROXEN 500 MG PO TABS
500.0000 mg | ORAL_TABLET | Freq: Two times a day (BID) | ORAL | Status: DC | PRN
  Filled 2020-02-13: qty 1

## 2020-02-13 MED ORDER — HYDROXYZINE HCL 25 MG PO TABS
25.0000 mg | ORAL_TABLET | Freq: Three times a day (TID) | ORAL | Status: DC | PRN
  Filled 2020-02-13: qty 1

## 2020-02-13 MED ORDER — INSULIN ASPART 100 UNIT/ML ~~LOC~~ SOLN
8.0000 [IU] | Freq: Once | SUBCUTANEOUS | Status: AC
  Administered 2020-02-13: 8 [IU] via SUBCUTANEOUS
  Filled 2020-02-13: qty 1

## 2020-02-13 MED ORDER — NICOTINE POLACRILEX 2 MG MT GUM: 2.0000 mg | CHEWING_GUM | OROMUCOSAL | 0 refills | Status: DC | PRN

## 2020-02-13 MED ORDER — HYDROXYZINE HCL 25 MG PO TABS
25.0000 mg | ORAL_TABLET | Freq: Four times a day (QID) | ORAL | Status: DC | PRN
  Filled 2020-02-13: qty 1

## 2020-02-13 MED ORDER — METOPROLOL TARTRATE 5 MG/5ML IV SOLN: 5.0000 mg | Freq: Once | INTRAVENOUS | Status: DC

## 2020-02-13 NOTE — Progress Notes (Signed)
Inpatient Diabetes Program Recommendations  AACE/ADA: New Consensus Statement on Inpatient Glycemic Control (2015)  Target Ranges:  Prepandial:   less than 140 mg/dL      Peak postprandial:   less than 180 mg/dL (1-2 hours)      Critically ill patients:  140 - 180 mg/dL   Results for JACOBS, GOLAB (MRN [id number]) as of 02/13/2020 08:45  Ref. Range 02/12/2020 07:42 02/12/2020 09:05 02/12/2020 10:01 02/12/2020 11:32 02/12/2020 16:33 02/12/2020 21:04  Glucose-Capillary Latest Ref Range: 70 - 99 mg/dL 487 (H)  15 units NOVOLOG +  25 units 70/30 Insulin  535 (HH) 459 (H) 352 (H)  15 units NOVOLOG  261 (H)  8 units NOVOLOG +  25 units 70/30 Insulin  219 (H)  2 units NOVOLOG    Results for STRYDER, POITRA (MRN [id number]) as of 02/13/2020 08:45  Ref. Range 02/13/2020 07:49  Glucose-Capillary Latest Ref Range: 70 - 99 mg/dL 408 (H)     Diabetes history:NO  New Diagnosis of Diabetes made this admission   Current Insulin Orders: 70/30 Insulin 25 units BID          Novolog 0-15 units TID with meals         A1C>15.5% on 02/09/20 Pt and wife counseled at length by the Diabetes Coordinator on 09/21 and pt was shown how to use Insulin pen at home   MD- Note CBG 408 this AM.  Please consider:  1. Increase 70/30 Insulin to 35 units BID with Breakfast and Dinner  2. Increase the Novolog SSI to the 0-20 unit scale (Resistant)  At time of discharge, please provide Rx for:  Humalog Kwikpen 75/25 [id number]) Insulin pen needles (#[id number]) TOC team provided pt with CBG meter kit 09/22    --Will follow patient during hospitalization--  Wyn Quaker RN, MSN, CDE Diabetes Coordinator Inpatient Glycemic Control Team Team Pager: [phone] (8a-5p)

## 2020-02-13 NOTE — Consult Note (Signed)
[facility] Face-to-Face Psychiatry Consult   Reason for Consult:   Dual Diagnosis  Bipolar Mixed Heroin dependence withdrawal ----Social issues --homeless with his wife    Referring Physician:  Chana Bode MD  Patient Identification: Earl Moreno MRN:  [id number] Principal Diagnosis: Hyperglycemia due to type 2 diabetes mellitus ([place]) Diagnosis:  Principal Problem:   Hyperglycemia due to type 2 diabetes mellitus ([place]) Active Problems:   New onset type 2 diabetes mellitus ([place])   Hyperglycemia due to diabetes mellitus ([place])   Abscess   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 male patient admitted with   Elevated Blood sugars, uncontrolled DM not managed prior ---Skin abscesses mainly from IV chronic heroin use ----no previous  Treatment Issues of bipolar disorder mixed never treated   HPI:   As above --homeless Male of cherokee descent mixed ethnicity  Here for above issues ---with untreated bipolar disorder mixed, Generalized anxiety and mainly heroin dependence   Ups and downs, mood swings, lability, highs and lows, at times loud pressured speech ---IED (explosive episodes with cursing and rage)--episodes ---narcissistic rage as well mixed in. He has at times decreased need for sleep, fast and speeded thoughts and actions racing in his head ----on the go at times cannot sit still and in these states is impulsive and can be reckless in general   Mixed together he as  depressed mood crying spells lack of energy concentration motivation, attention, weight loss of 40 lbs ---anhedonia at times ---decreased appetite   Also has generalized anxiety with excessive nervousness, tension frustration, worry,  Stress ongoing ---doom and gloom dread feelings on his chest ----sudden severe fear and panic mixed with various somatic and autonomic symptoms    Primary care gave him xanax before but not a good longer term choice secondary to addictive personality issues.      Continues oral opiate use since he had back surgery and now with IV needle use on relatively daily basis     He also has cocaine crack dependence as well as marijuana use as well   Seeks referrals for outpatient centers  And wants some form of medicine for bipolar   He has never had psych meds and feels haldol is working well on current doses   Past Psychiatric History:  No previous treatment at all over time     Risk to Self:  none  Risk to Others:  none  Prior Inpatient Therapy:  none  Prior Outpatient Therapy:  none  No active Detox, rehab or psych follow up        Past Medical History: No past medical history on file.  Past Surgical History:  Procedure Laterality Date  . I & D EXTREMITY Left 06/05/2018   Procedure: IRRIGATION AND DEBRIDEMENT EXTREMITY-LEFT HAND;  Surgeon: Lovell Sheehan, MD;  Location: [facility] ORS;  Service: Orthopedics;  Laterality: Left;   Family History:   Parents with bipolar disorder, psychosis substance drug and etoh issues along with IED and chaotic family dysfunctional raising  He has 5-8 kids (was not clear from his dialogue from a few relationships all grown and do not keep in touch with he and current wife.      Family History  Family history unknown: Yes   Family Psychiatric  History: Social History:  Social History   Substance and Sexual Activity  Alcohol Use No     Social History   Substance and Sexual Activity  Drug Use Yes  . Types:  Cocaine, IV   Comment: Heroin    Social History   Socioeconomic History  . Marital status: Single    Spouse name: Not on file  . Number of children: Not on file  . Years of education: Not on file  . Highest education level: Not on file  Occupational History  . Not on file  Tobacco Use  . Smoking status: Current Every Day Smoker    Packs/day: 0.50  . Smokeless tobacco: Never Used  Substance and Sexual Activity  . Alcohol use: No  . Drug use: Yes    Types: Cocaine, IV    Comment: Heroin  . Sexual activity: Not on file    Comment: occasionally  Other Topics Concern  . Not on file  Social History Narrative  . Not on file   Social Determinants of Health   Financial Resource Strain:   . Difficulty of Paying Living Expenses: Not on file  Food Insecurity:   . Worried About Programme researcher, broadcasting/film/video in the Last Year: Not on file  . Ran Out of Food in the Last Year: Not on file  Transportation Needs:   . Lack of Transportation (Medical): Not on file  . Lack of Transportation (Non-Medical): Not on file  Physical Activity:   . Days of Exercise per Week: Not on file  . Minutes of Exercise per Session: Not on file  Stress:   . Feeling of Stress : Not on file  Social Connections:   . Frequency of Communication with Friends and Family: Not on file  . Frequency of Social Gatherings with Friends and Family: Not on file  . Attends Religious Services: Not on file  . Active Member of Clubs or Organizations: Not on file  . Attends Banker Meetings: Not on file  . Marital Status: Not on file   Additional Social History:  Homeless over some months living out of car--with wife  Not working ----trying to get disability for back pain, injuries --as well as for bipolarism   Allergies:  No Known Allergies  Labs:  Results for orders placed or performed during the hospital encounter of 02/09/20 (from the past 48 hour(s))  Glucose, capillary     Status: Abnormal   Collection  Time: 02/11/20  4:36 PM  Result Value Ref Range   Glucose-Capillary 257 (H) 70 - 99 mg/dL    Comment: Glucose reference range applies only to samples taken after fasting for at least 8 hours.  Glucose, capillary     Status: Abnormal   Collection Time: 02/11/20  8:41 PM  Result Value Ref Range   Glucose-Capillary 321 (H) 70 - 99 mg/dL    Comment: Glucose reference range applies only to samples taken after fasting for at least 8 hours.  Glucose, capillary     Status: Abnormal   Collection Time: 02/12/20  7:42 AM  Result Value Ref Range   Glucose-Capillary 487 (H) 70 - 99 mg/dL    Comment: Glucose reference range applies only to samples taken after fasting for  at least 8 hours.   Comment 1 Notify RN    Comment 2 Document in Chart   Glucose, capillary     Status: Abnormal   Collection Time: 02/12/20  9:05 AM  Result Value Ref Range   Glucose-Capillary 535 (HH) 70 - 99 mg/dL    Comment: Glucose reference range applies only to samples taken after fasting for at least 8 hours.   Comment 1 Notify RN    Comment 2 Document in Chart   CBC with Differential/Platelet     Status: Abnormal   Collection Time: 02/12/20  9:17 AM  Result Value Ref Range   WBC 5.6 4.0 - 10.5 K/uL   RBC 4.30 4.22 - 5.81 MIL/uL   Hemoglobin 13.1 13.0 - 17.0 g/dL   HCT 38.3 (L) 39 - 52 %   MCV 89.1 80.0 - 100.0 fL   MCH 30.5 26.0 - 34.0 pg   MCHC 34.2 30.0 - 36.0 g/dL   RDW 12.6 11.5 - 15.5 %   Platelets 230 150 - 400 K/uL   nRBC 0.0 0.0 - 0.2 %   Neutrophils Relative % 66 %   Neutro Abs 3.7 1.7 - 7.7 K/uL   Lymphocytes Relative 25 %   Lymphs Abs 1.4 0.7 - 4.0 K/uL   Monocytes Relative 5 %   Monocytes Absolute 0.3 0 - 1 K/uL   Eosinophils Relative 3 %   Eosinophils Absolute 0.2 0 - 0 K/uL   Basophils Relative 1 %   Basophils Absolute 0.1 0 - 0 K/uL   Immature Granulocytes 0 %   Abs Immature Granulocytes 0.02 0.00 - 0.07 K/uL    Comment: Performed at [facility], [address] [place],  [place] [address]  Comprehensive metabolic panel     Status: Abnormal   Collection Time: 02/12/20  9:17 AM  Result Value Ref Range   Sodium 128 (L) 135 - 145 mmol/L   Potassium 3.8 3.5 - 5.1 mmol/L   Chloride 97 (L) 98 - 111 mmol/L   CO2 25 22 - 32 mmol/L   Glucose, Bld 522 (HH) 70 - 99 mg/dL    Comment: CRITICAL RESULT CALLED TO, READ BACK BY AND VERIFIED WITH CANDIS SUMMERS AT 1122 02/12/20 DAS Glucose reference range applies only to samples taken after fasting for at least 8 hours.    BUN 14 6 - 20 mg/dL   Creatinine, Ser 0.59 (L) 0.61 - 1.24 mg/dL   Calcium 8.7 (L) 8.9 - 10.3 mg/dL   Total Protein 6.4 (L) 6.5 - 8.1 g/dL   Albumin 2.8 (L) 3.5 - 5.0 g/dL   AST 32 15 - 41 U/L   ALT 23 0 - 44 U/L   Alkaline Phosphatase 96 38 - 126 U/L   Total Bilirubin 0.4 0.3 - 1.2 mg/dL   GFR calc non Af Amer >60 >60 mL/min   GFR calc Af Amer >60 >60 mL/min   Anion gap 6 5 - 15    Comment: Performed at [facility], [address], [place], [place] [address]  Phosphorus     Status: None   Collection Time: 02/12/20  9:17 AM  Result Value Ref Range   Phosphorus 3.5 2.5 - 4.6 mg/dL    Comment: Performed at [facility], [address] [place], [place] [address]  Magnesium     Status: None   Collection Time: 02/12/20  9:17 AM  Result Value Ref Range   Magnesium 1.7 1.7 - 2.4 mg/dL  Comment: Performed at [facility], [address] [place], [place] [address]  Glucose, capillary     Status: Abnormal   Collection Time: 02/12/20 10:01 AM  Result Value Ref Range   Glucose-Capillary 459 (H) 70 - 99 mg/dL    Comment: Glucose reference range applies only to samples taken after fasting for at least 8 hours.   Comment 1 Notify RN    Comment 2 Document in Chart   Glucose, capillary     Status: Abnormal   Collection Time: 02/12/20 11:32 AM  Result Value Ref Range   Glucose-Capillary 352 (H) 70 - 99 mg/dL    Comment: Glucose reference range applies only to samples taken after  fasting for at least 8 hours.   Comment 1 Notify RN    Comment 2 Document in Chart   Glucose, capillary     Status: Abnormal   Collection Time: 02/12/20  4:33 PM  Result Value Ref Range   Glucose-Capillary 261 (H) 70 - 99 mg/dL    Comment: Glucose reference range applies only to samples taken after fasting for at least 8 hours.   Comment 1 Notify RN    Comment 2 Document in Chart   Glucose, capillary     Status: Abnormal   Collection Time: 02/12/20  9:04 PM  Result Value Ref Range   Glucose-Capillary 219 (H) 70 - 99 mg/dL    Comment: Glucose reference range applies only to samples taken after fasting for at least 8 hours.   Comment 1 Notify RN   CBC     Status: None   Collection Time: 02/13/20  5:46 AM  Result Value Ref Range   WBC 6.7 4.0 - 10.5 K/uL   RBC 4.50 4.22 - 5.81 MIL/uL   Hemoglobin 14.0 13.0 - 17.0 g/dL   HCT 39.2 39 - 52 %   MCV 87.1 80.0 - 100.0 fL   MCH 31.1 26.0 - 34.0 pg   MCHC 35.7 30.0 - 36.0 g/dL   RDW 12.5 11.5 - 15.5 %   Platelets 267 150 - 400 K/uL   nRBC 0.0 0.0 - 0.2 %    Comment: Performed at [facility], [address] [place], [place] [address]  Magnesium     Status: None   Collection Time: 02/13/20  5:46 AM  Result Value Ref Range   Magnesium 2.2 1.7 - 2.4 mg/dL    Comment: Performed at [facility], [address] [place], [place] [address]  Glucose, capillary     Status: Abnormal   Collection Time: 02/13/20  7:49 AM  Result Value Ref Range   Glucose-Capillary 408 (H) 70 - 99 mg/dL    Comment: Glucose reference range applies only to samples taken after fasting for at least 8 hours.   Comment 1 Notify RN   Lactic acid, plasma     Status: None   Collection Time: 02/13/20  9:16 AM  Result Value Ref Range   Lactic Acid, Venous 1.8 0.5 - 1.9 mmol/L    Comment: Performed at [facility], [address] [place],  [address]  Comprehensive metabolic panel     Status: Abnormal   Collection Time: 02/13/20  9:16  AM  Result Value Ref Range   Sodium 133 (L) 135 - 145 mmol/L   Potassium 4.1 3.5 - 5.1 mmol/L   Chloride 99 98 - 111 mmol/L   CO2 24 22 - 32 mmol/L   Glucose, Bld 406 (H) 70 - 99 mg/dL  Comment: Glucose reference range applies only to samples taken after fasting for at least 8 hours.   BUN 16 6 - 20 mg/dL   Creatinine, Ser 0.61 0.61 - 1.24 mg/dL   Calcium 8.9 8.9 - 10.3 mg/dL   Total Protein 7.2 6.5 - 8.1 g/dL   Albumin 3.2 (L) 3.5 - 5.0 g/dL   AST 57 (H) 15 - 41 U/L   ALT 36 0 - 44 U/L   Alkaline Phosphatase 86 38 - 126 U/L   Total Bilirubin 0.4 0.3 - 1.2 mg/dL   GFR calc non Af Amer >60 >60 mL/min   GFR calc Af Amer >60 >60 mL/min   Anion gap 10 5 - 15    Comment: Performed at [facility], [address] [place], [place] [address]  Phosphorus     Status: None   Collection Time: 02/13/20  9:16 AM  Result Value Ref Range   Phosphorus 3.6 2.5 - 4.6 mg/dL    Comment: Performed at [facility], [address] [place], [place] [address]  Glucose, capillary     Status: Abnormal   Collection Time: 02/13/20 11:58 AM  Result Value Ref Range   Glucose-Capillary 458 (H) 70 - 99 mg/dL    Comment: Glucose reference range applies only to samples taken after fasting for at least 8 hours.    Current Facility-Administered Medications  Medication Dose Route Frequency Provider Last Rate Last Admin  . 0.9 %  sodium chloride infusion   Intravenous PRN Skipper Cliche A, MD 10 mL/hr at 02/11/20 1720 250 mL at 02/11/20 1720  . acetaminophen (TYLENOL) tablet 650 mg  650 mg Oral Q6H PRN Agbata, Tochukwu, MD       Or  . acetaminophen (TYLENOL) suppository 650 mg  650 mg Rectal Q6H PRN Agbata, Tochukwu, MD      . acetaminophen (TYLENOL) tablet 1,000 mg  1,000 mg Oral Q6H PRN Lucrezia Starch, MD   1,000 mg at 02/12/20 0135  . acidophilus (RISAQUAD) capsule 2 capsule  2 capsule Oral TID Skipper Cliche A, MD   2 capsule at 02/13/20 1512  . ALPRAZolam Duanne Moron) tablet 1 mg  1 mg  Oral TID PRN Skipper Cliche A, MD   1 mg at 02/12/20 2147  . amLODipine (NORVASC) tablet 10 mg  10 mg Oral Daily Agbata, Tochukwu, MD   10 mg at 02/13/20 0921  . diphenhydrAMINE (BENADRYL) injection 12.5 mg  12.5 mg Intravenous Q6H PRN Raiford Noble [place], DO      . DULoxetine (CYMBALTA) DR capsule 20 mg  20 mg Oral Daily Eulas Post, MD      . enoxaparin (LOVENOX) injection 40 mg  40 mg Subcutaneous Q24H Agbata, Tochukwu, MD   40 mg at 02/12/20 1625  . fluconazole (DIFLUCAN) tablet 200 mg  200 mg Oral Daily Raiford Noble [place], DO   200 mg at 02/13/20 3825  . gabapentin (NEURONTIN) tablet 600 mg  600 mg Oral TID Skipper Cliche A, MD   600 mg at 02/13/20 1512  . haloperidol lactate (HALDOL) injection 0.5 mg  0.5 mg Intramuscular TID Skipper Cliche A, MD       Or  . haloperidol (HALDOL) tablet 0.5 mg  0.5 mg Oral TID Skipper Cliche A, MD   0.5 mg at 02/13/20 1512  . hydrocortisone cream 1 %   Topical BID Raiford Noble [place], DO   1 application at 05/39/76 2149  . hydrOXYzine (ATARAX/VISTARIL) tablet 25 mg  25 mg Oral TID PRN  [name], Omair Latif, DO      . insulin aspart (novoLOG) injection 0-20 Units  0-20 Units Subcutaneous Q4H Raiford Noble [place], [place]   20 Units at 02/13/20 1239  . insulin aspart protamine- aspart (NOVOLOG MIX 70/30) injection 35 Units  35 Units Subcutaneous BID WC [name], Omair Latif, DO      . insulin starter kit- pen needles (English) 1 kit  1 kit Other Once Athena Masse, MD      . ketorolac (TORADOL) 15 MG/ML injection 15 mg  15 mg Intravenous Q6H PRN Edison Simon R, PA-C   15 mg at 02/13/20 1247  . levofloxacin (LEVAQUIN) tablet 750 mg  750 mg Oral Daily Raiford Noble [place], DO   750 mg at 02/13/20 8185  . morphine 2 MG/ML injection 1 mg  1 mg Intravenous Q4H PRN Shahmehdi, Seyed A, MD   1 mg at 02/13/20 1512  . multivitamin with minerals tablet 1 tablet  1 tablet Oral Daily Skipper Cliche A, MD   1 tablet at 02/13/20 6314  . nicotine polacrilex  (NICORETTE) gum 2 mg  2 mg Oral PRN Tylene Fantasia, PA-C   2 mg at 02/13/20 1513  . ondansetron (ZOFRAN) tablet 4 mg  4 mg Oral Q6H PRN Athena Masse, MD       Or  . ondansetron [facility]) injection 4 mg  4 mg Intravenous Q6H PRN Athena Masse, MD      . protein supplement (ENSURE MAX) liquid  11 oz Oral BID BM Shahmehdi, Seyed A, MD   11 oz at 02/13/20 1240  . sodium chloride flush (NS) 0.9 % injection 10-40 mL  10-40 mL Intracatheter Q12H Lucrezia Starch, MD   10 mL at 02/13/20 0925  . sodium chloride flush (NS) 0.9 % injection 10-40 mL  10-40 mL Intracatheter PRN Lucrezia Starch, MD        Musculoskeletal: Strength & Muscle Tone:  wnl  Gait & Station: slow somewhat needs assistance at times Patient leans:  Na   Psychiatric Specialty Exam: Physical Exam  Review of Systems  Blood pressure 128/71, pulse 70, temperature 97.9 F (36.6 C), resp. rate 17, height _0  (1.727 m), weight 61.1 kg, SpO2 99 %.Body mass index is 20.48 kg/m.    Mental Status  Alert cooperative oriented times four Consciousness not clouded or fluctuant Mood depressed anxious affect blunted Rapport and eye contact okay Easily angered  No frank shakes tics tremors Concentration and attention ---fair Speech loud somewhat pressured Thought process and content --no severe mania or psychosis Preoccupied with drugs themes and survival themes Memory remote recent and immediate okay  Judgement insight reliability all poor Intelligence fund of knowledge average to below averge Abstraction fair  Si and HI contracts for safety     He says he has bone pain, runny nose and eyes  Diarrhea, muscle pain ---slight shake and tension GI upset and nausea as he comes down from heroin                                                          Recall fair to poor Language --[place] --- Akathisia none Handedness not known Aims not done Assets --not clear  ADL's---limited because they  are living out of their car and he is continuously using IV  heroin Cognition declining  Sleep at times erratic Psychomotor activity markedly elevated when angry and will use severe curse words and demeaning methods with people when is angry or frustrated           Treatment Plan Summary:  Mixed cherokee ethnic male and his spouse also --in very dysfunctional state and circumstances with severe addiction and subsequent issues  He refuses dual diagnosis inpatient care and does not seem to have enough insight to go to outpatient substance program  Best we can do is refer to [facility] for outpatient care   For at least the two meds   He feels marked difference --with tid Haldol and that is an okay dose Cymbalta started also for anxiety depression and overall pain  Artane added as a side effect prevention  Best to avoid benzos  At discharge    I put him on the COW opiate detox method protocol here (the one we have)  But clonidine in this series makes people sleepy  See order to hold if sedated   Meds for his withdrawal are in that protocol    TOC will hopefully help them with homeless issues    Disposition: Eulas Post, MD 02/13/2020 3:38 PM     [facility] referral --no active SI or HI at this time   Not cooperative with other interventons now   He can go home on    Haldol 0.5 tid  Artane 1 bid  And Cymbalta 20 daily   As discharge meds

## 2020-02-13 NOTE — Progress Notes (Signed)
PROGRESS NOTE    Earl Moreno  ZTI:[id number] DOB: 12-11-1965 DOA: 02/09/2020 PCP: Patient, No Pcp Per   Brief Narrative:  HPI per Dr. Royce Macadamia Agbata on 02/09/20 Earl Moreno is a 54 y.o. male with medical history significant for IV drug use (patient admits to being a heroin addict) and uses intranasal cocaine as well.  He was brought into the emergency room by his wife for evaluation of multiple abscesses involving both left and right wrist as well as left forearm.  He has had these abscesses for about a week and the one on his left forearm appears to be draining.  His wife states that he has had about a 30 pound weight loss over the last couple of weeks associated with increasing thirst, urinary frequency and worsening fatigue.  He denies having any fever chills, he has no shortness of breath, no abdominal pain, no chest pain, no changes in his bowel habits or any urinary symptoms. Patient has a strong family history of diabetes mellitus His last IV drug use was 1 day prior to his admission. Labs show sodium 124, potassium 4.2, chloride 85, bicarb 26, serum glucose 828, BUN 9, creatinine 0.79, calcium 9.3, albumin 3.7, AST 21, ALT 23, white count 9.4, hemoglobin 15.6, hematocrit 43.7, MCV 84.7, RDW 12.5, platelet count 288 Chest x-ray reviewed by me shows no infiltrate or effusion Left forearm x-ray shows no soft tissue gas, retained needle, or bony erosion. Twelve-lead EKG reviewed by me shows sinus tachycardia   ED Course: Patient is a 54 year old Caucasian male who is a heroin addict and who presented to the ER for evaluation of multiple abscesses on both wrist as well as his left forearm.  He also gives a history of significant weight loss, increasing thirst, urinary frequency and increased fatigue.  His labs revealed a glucose of greater than 800 and normal serum bicarbonate level.  Patient was started on aggressive IV fluid hydration and received a dose of IV vancomycin.  He is  currently on regular insulin infusion will be admitted to the hospital for further evaluation.  **Interim History General surgery was consulted and he had an incision and drainage of the abscess of the left forearm.  Initial blood cultures were growing  Serratia marcescens and wound culture from 9/20 started growing gram-positive rods and gram-negative rods and antibiotics were switched to cefepime.  Patient threatened to leave AMA the day before yesterday and was very belligerent with staff and refused labs yesterday morning but He was finally agreeable to obtain labs. He has a lactic acidosis now and we have discontinued Metformin given him normal saline bolus and maintenance IV fluids.  Patient continues to ask for increase in his pain medication stating that he is a heroin addict and that he is getting "dope sick" and feels like he is hallucinating however states that he appears comfortable in no acute distress.  We will continue his current pain regimen at this time as well as his Toradol and have to be judicious with his IV narcotics given his IV drug abuse.  Culture sensitivities are still pending however the likely Gram stain from the wound culture and so we will change him over to IV Levaquin in anticipation of discharge.  Given that has wound Gram stain and culture show some Candida albicans we will also give him some fluconazole 400 mg times once and then will continue for 4 more days p.o.  [facility] was consulted given his history and Dr. Davis Gourd made  some recommendations and started the patient on Cymbalta, Artane and start him on a clonidine taper as well for his opiate withdrawal.  Assessment & Plan:   Principal Problem:   Hyperglycemia due to type 2 diabetes mellitus ([place]) Active Problems:   New onset type 2 diabetes mellitus ([place])   Hyperglycemia due to diabetes mellitus ([place])   Abscess   Heroin addiction ([place])   Hyponatremia   Essential hypertension   Protein-calorie malnutrition,  severe   Abscess of multiple sites   Hyperosmolar hyperglycemic state (HHS) ([employer])   Polysubstance abuse ([place])  Hyperglycemia due to type 2 diabetes mellitus NEW onset diabetes mellitus type 2 -Anion gap closed at 12, closed, DKA ruled out -Patient appears to have new onset diabetes mellitus and presents with significant weight loss, increased thirst, urinary frequency and fatigue -Initialblood sugar was greater than 800g/dl CBG's are improving however unfortunately did not receive any sliding scale last night and blood sugars this morning were elevated and went over 500 and was 535.  Now CBGs are ranging from 166-458 -Continue aggressive IV fluid hydration; resumed IV fluid hydration given his lactic acidosis -Continued insulin drip >>> improving anticipating discontinuing drip -Initiating long-acting insulin 20 units nightly this is a change at 70/30 and will need to be changed to 75/25 in the outpatient setting, SSI coverage -We will initiate Metformin however it caused a lactic acidosis and will stop now -A1c:  Greater than 15.5 -Patient will require diabetic education and nutrition evaluation -CBG's ranging from 234-337 yesterday and have been a little bit higher today so we have added sliding scale nightly and increased his 70/30 from 18 units to 25 units twice daily and was further increased to 35 units subcu twice daily -Sliding scale insulin has been changed to every 4 resistant scale  Bacteremia / multiple Abscesses  SIRS/sepsis ruled out -1 out of 2 blood cultures growing Serratia marcescens susceptibilities to follow still; wound culture growing out Serratia marcescens which is relatively pansensitive -Patient is initiated on IV antibiotics of cefepime -Doxycycline for possible MRSA coverage now discontinued -Repeating blood cultures in the morning of 02/11/2020 and this is fine no growth to date -Patient with skin/soft tissue infection involving both wrists and left  forearm -General surgery consult following, continue wound care, no plan for I&D at this time -Continue local wound care daily + prn; Pack with iodoform gauze, cover, secure with tape-->  -Appreciate surgery evaluation and he will need at least 10 to 14 days of antibiotics when sensitivities are obtained -General surgery recommending continuing daily wound care as outlined and de-escalating antibiotics per culture sensitivities and will see him in the outpatient setting if necessary -Awaiting on final sensitivities for his blood cultures but likely they are the same as his wound culture.  His wound culture also did grow out some Candida albicans so we will give him some fluconazole p.o. -We will de-escalate IV Levaquin yesterday -Complaining of itching near abscess sites and will add Hydrocortisone 1% cream Topically Daily and Diphenhydramine 12.5 mg IV q6hprn Itching   Hypertension - Uncontrolled -We will start patient on amlodipine 10 mg daily -Monitoring very closely, titrating meds, as needed hydralazine -Continue to Monitor BP per Protocol -Last BP reading was 128/71  Hyponatremia -Transient secondary to significant hyperglycemia -Monitoring sodium level closely, improving with IV fluid hydration and correction of hyperglycemia -Secondary to significant hyperglycemia -Sodium is 133 again but his blood sugar was 406 -Continue to monitor and repeat BMP in a.m.  Polysubstance dependence -Patient  is urine drug screen positive for heroine/cocaine/opiates -he had some opiate withdrawal symptoms and psychiatry was consulted and started him on the COWS protocol and he is on a clonidine 0.05 twice daily for 4 doses -Patient has been counseled on the need to abstain from illicit drug use -Judicious use of opiates given his IV drug abuse past -C/w Nicotine Polacrilex 2 mg po PRN smoking Cessation  -Psychiatry recommending avoiding benzos at discharge recommending starting Cymbalta 20 mg p.o.  daily, continuing Haldol 0.5 mg p.o. 3 times daily, and starting Artane -Dr. Janese Banks recommended dual diagnosis inpatient care and patient did not have enough insight to go to the outpatient substance program and the best he feels that we can do is refer the patient to [facility] for outpatient care for at least 2 medication refills  Hepatitis C -Continue monitoring LFTs; AST is slightly elevated and went from 32-57 today -Hepatitis C reactive, quantitive RNA hepatitis C ordered -Likely need to follow-up as an outpatient with infectious disease  Lactic acidosis -Lactic acid level has trended all the way to 4.2 the day before yesterday and this morning 1.8 -Given a liter of normal saline bolus -We will stop his Metformin -We will stop IV maintenance fluids. -Continue monitor and trend lactic acid levels if patient allows and repeat in the AM  Hypophosphatemia -Patient's phosphorus level was 2.0 and replete with IV K-Phos 20 mmol yesterday and is now improved to 3.6 -Continue monitor and replete as necessary  Severe protein calorie malnutrition -In the setting of his heroin abuse -Nutritionist consulted for further evaluation and recommendations and recommending continuing supplements -C/w Ensure Maxx 11 ounce po BID   DVT prophylaxis: Enoxaparin 40 g subcu every 24 Code Status: FULL CODE  Family Communication: No family at bedside  Disposition Plan: Pending further improvement of blood sugars as well as evaluation and treatment of his gram-negative bacteremia; anticipating discharge in the next 24 to 40 hours  Status is: Inpatient  Remains inpatient appropriate because:Ongoing diagnostic testing needed not appropriate for outpatient work up and Inpatient level of care appropriate due to severity of illness   Dispo: The patient is from: Home              Anticipated d/c is to: Home               Anticipated d/c date is: 1 day              Patient currently is not medically stable to  d/c.  Consultants:   General Surgery  Psychiatry   Procedures: I and D by General Surgery on 02/09/20  Antimicrobials:  Anti-infectives (From admission, onward)   Start     Dose/Rate Route Frequency Ordered Stop   02/14/20 0000  levofloxacin (LEVAQUIN) 750 MG tablet        750 mg Oral Daily 02/13/20 1220     02/13/20 1000  fluconazole (DIFLUCAN) tablet 200 mg        200 mg Oral Daily 02/12/20 1010 02/17/20 0959   02/12/20 1400  fluconazole (DIFLUCAN) tablet 400 mg        400 mg Oral  Once 02/12/20 1010 02/12/20 1400   02/12/20 1400  levofloxacin (LEVAQUIN) tablet 750 mg        750 mg Oral Daily 02/12/20 1010 02/20/20 0959   02/10/20 0130  ceFEPIme (MAXIPIME) 2 g in sodium chloride 0.9 % 100 mL IVPB  Status:  Discontinued        2 g 200 mL/hr  over 30 Minutes Intravenous Every 8 hours 02/10/20 0116 02/12/20 1010   02/10/20 0000  doxycycline (VIBRAMYCIN) 50 MG capsule  Status:  Discontinued        100 mg Oral 2 times daily 02/10/20 1505 02/12/20    02/09/20 1000  doxycycline (VIBRA-TABS) tablet 100 mg  Status:  Discontinued        100 mg Oral Every 12 hours 02/09/20 0824 02/10/20 0115   02/09/20 0415  vancomycin (VANCOCIN) IVPB 1000 mg/200 mL premix        1,000 mg 200 mL/hr over 60 Minutes Intravenous  Once 02/09/20 0401 02/09/20 0830        Subjective: Seen and examined at bedside and he feels a little difference being on Haldol.  Denies any chest pain, lightheadedness or dizziness.  No nausea or vomiting.  States that he is feeling much better and states that his itching is subsiding with the hydrocortisone cream.  Denies any lightheadedness or dizziness but wanted to speak with psychiatry.  Feels like he is improving and appreciated of of the care care.  Objective: Vitals:   02/12/20 1632 02/13/20 0026 02/13/20 0748 02/13/20 1537  BP: (!) 145/86 133/89 120/74 128/71  Pulse: 75 60 (!) 55 70  Resp: 18 16 14 17   Temp: 98 F (36.7 C) 97.6 F (36.4 C) 97.6 F (36.4 C) 97.9  F (36.6 C)  TempSrc: Oral Oral Oral   SpO2: 99% 99% 100% 99%  Weight:      Height:        Intake/Output Summary (Last 24 hours) at 02/13/2020 1652 Last data filed at 02/13/2020 0900 Gross per 24 hour  Intake 480 ml  Output 0 ml  Net 480 ml   Filed Weights   02/08/20 2048 02/09/20 2026  Weight: 59 kg 61.1 kg   Examination: Physical Exam:  Constitutional: The patient is a thin chronically ill-appearing Caucasian slightly disheveled male in no acute distress appears calm comfortable today Eyes: Lids and conjunctivae normal, sclerae anicteric  ENMT: External Ears, Nose appear normal. Grossly normal hearing.  Neck: Appears normal, supple, no cervical masses, normal ROM, no appreciable thyromegaly neck: No JVD Respiratory: Diminished to auscultation bilaterally, no wheezing, rales, rhonchi or crackles. Normal respiratory effort and patient is not tachypenic. No accessory muscle use.  Unlabored breathing Cardiovascular: RRR, no murmurs / rubs / gallops. S1 and S2 auscultated.  Abdomen: Soft, non-tender, non-distended. Bowel sounds positive.  GU: Deferred. Musculoskeletal: No clubbing / cyanosis of digits/nails. No joint deformity upper and lower extremities.   Skin: Patient is diffusely tattoo throughout his entire body and has multiple skin lesions from his inject himself with heroin.  Has multiple abscesses and induration on his wrists bilaterally left arm is wrapped.. No induration; Warm and dry.  Neurologic: CN 2-12 grossly intact with no focal deficits. Romberg sign and cerebellar reflexes not assessed.  Psychiatric: Normal judgment and insight. Alert and oriented x 3. Normal mood and appropriate affect.   Data Reviewed: I have personally reviewed following labs and imaging studies  CBC: Recent Labs  Lab 02/08/20 2129 02/11/20 1317 02/12/20 0917 02/13/20 0546  WBC 9.4 8.3 5.6 6.7  NEUTROABS 6.5 6.1 3.7  --   HGB 15.6 16.4 13.1 14.0  HCT 43.7 47.7 38.3* 39.2  MCV 84.7 87.8  89.1 87.1  PLT 288 318 230 892   Basic Metabolic Panel: Recent Labs  Lab 02/09/20 0345 02/10/20 1324 02/11/20 1317 02/12/20 0917 02/13/20 0546 02/13/20 0916  NA 132* 134* 136 128*  --  133*  K 3.8 3.2* 3.5 3.8  --  4.1  CL 94* 98 100 97*  --  99  CO2 26 24 22 25   --  24  GLUCOSE 519* 279* 227* 522*  --  406*  BUN 9 11 12 14   --  16  CREATININE 0.62 0.55* 0.58* 0.59*  --  0.61  CALCIUM 8.9 8.8* 9.7 8.7*  --  8.9  MG  --   --  1.7 1.7 2.2  --   PHOS  --   --  2.0* 3.5  --  3.6   GFR: Estimated Creatinine Clearance: 90.2 mL/min (by C-G formula based on SCr of 0.61 mg/dL). Liver Function Tests: Recent Labs  Lab 02/08/20 2129 02/11/20 1317 02/12/20 0917 02/13/20 0916  AST 21 34 32 57*  ALT 23 22 23  36  ALKPHOS 128* 92 96 86  BILITOT 0.9 0.5 0.4 0.4  PROT 8.5* 8.3* 6.4* 7.2  ALBUMIN 3.7 3.4* 2.8* 3.2*   No results for input(s): LIPASE, AMYLASE in the last 168 hours. No results for input(s): AMMONIA in the last 168 hours. Coagulation Profile: No results for input(s): INR, PROTIME in the last 168 hours. Cardiac Enzymes: No results for input(s): CKTOTAL, CKMB, CKMBINDEX, TROPONINI in the last 168 hours. BNP (last 3 results) No results for input(s): PROBNP in the last 8760 hours. HbA1C: No results for input(s): HGBA1C in the last 72 hours. CBG: Recent Labs  Lab 02/12/20 1633 02/12/20 2104 02/13/20 0749 02/13/20 1158 02/13/20 1614  GLUCAP 261* 219* 408* 458* 166*   Lipid Profile: No results for input(s): CHOL, HDL, LDLCALC, TRIG, CHOLHDL, LDLDIRECT in the last 72 hours. Thyroid Function Tests: No results for input(s): TSH, T4TOTAL, FREET4, T3FREE, THYROIDAB in the last 72 hours. Anemia Panel: No results for input(s): VITAMINB12, FOLATE, FERRITIN, TIBC, IRON, RETICCTPCT in the last 72 hours. Sepsis Labs: Recent Labs  Lab 02/10/20 1324 02/11/20 1317 02/13/20 0916  PROCALCITON 0.10  --   --   LATICACIDVEN 2.7* 4.2* 1.8    Recent Results (from the past 240  hour(s))  SARS Coronavirus 2 by RT PCR (hospital order, performed in [facility] hospital lab) Nasopharyngeal Nasopharyngeal Swab     Status: None   Collection Time: 02/09/20  3:45 AM   Specimen: Nasopharyngeal Swab  Result Value Ref Range Status   SARS Coronavirus 2 NEGATIVE NEGATIVE Final    Comment: (NOTE) SARS-CoV-2 target nucleic acids are NOT DETECTED.  The SARS-CoV-2 RNA is generally detectable in upper and lower respiratory specimens during the acute phase of infection. The lowest concentration of SARS-CoV-2 viral copies this assay can detect is 250 copies / mL. A negative result does not preclude SARS-CoV-2 infection and should not be used as the sole basis for treatment or other patient management decisions.  A negative result may occur with improper specimen collection / handling, submission of specimen other than nasopharyngeal swab, presence of viral mutation(s) within the areas targeted by this assay, and inadequate number of viral copies (<250 copies / mL). A negative result must be combined with clinical observations, patient history, and epidemiological information.  Fact Sheet for Patients:   StrictlyIdeas.no  Fact Sheet for Healthcare Providers: BankingDealers.co.za  This test is not yet approved or  cleared by the [place] FDA and has been authorized for detection and/or diagnosis of SARS-CoV-2 by FDA under an Emergency Use Authorization (EUA).  This EUA will remain in effect (meaning this test can be used) for the duration of the COVID-19 declaration under  Section 564(b)(1) of the Act, 21 U.S.C. section 360bbb-3(b)(1), unless the authorization is terminated or revoked sooner.  Performed at [facility], [address] [place], [place] [address]   Blood culture (routine x 2)     Status: Abnormal (Preliminary result)   Collection Time: 02/09/20  3:55 AM   Specimen: BLOOD  Result Value Ref Range  Status   Specimen Description   Final    BLOOD RIGHT ARM Performed at [facility], [address], [place], [place] [address]    Special Requests   Final    BOTTLES DRAWN AEROBIC AND ANAEROBIC Blood Culture results may not be optimal due to an inadequate volume of blood received in culture bottles Performed at [facility], [address], [place], [place] [address]    Culture  Setup Time   Final    GRAM NEGATIVE RODS ANAEROBIC BOTTLE ONLY Organism ID to follow CRITICAL RESULT CALLED TO, READ BACK BY AND VERIFIED WITHVioleta Gelinas @ 0057 02/10/20 RH Performed at [facility], [address], [place], [place] [address]    Culture (A)  Final    SERRATIA MARCESCENS SUSCEPTIBILITIES TO FOLLOW REPEATING Performed at [facility], [place] [address] [place], [place] [address]    Report Status PENDING  Incomplete  Wound or Superficial Culture     Status: None   Collection Time: 02/09/20  3:55 AM   Specimen: Arm; Wound  Result Value Ref Range Status   Specimen Description   Final    ARM LEFT FOREARM Performed at [facility], [address], [place], [place] [address]    Special Requests   Final    NONE Performed at [facility], [address] [place], [place] [address]    Gram Stain   Final    FEW WBC PRESENT, PREDOMINANTLY PMN RARE GRAM POSITIVE RODS RARE GRAM POSITIVE COCCI Performed at [facility], [place] [address], [place], [place] [address]    Culture FEW SERRATIA MARCESCENS  Final   Report Status 02/11/2020 FINAL  Final   Organism ID, Bacteria SERRATIA MARCESCENS  Final      Susceptibility   Serratia marcescens - MIC*    CEFAZOLIN >=64 RESISTANT Resistant     CEFEPIME <=0.12 SENSITIVE Sensitive     CEFTAZIDIME <=1 SENSITIVE Sensitive     CEFTRIAXONE <=0.25 SENSITIVE Sensitive     CIPROFLOXACIN <=0.25 SENSITIVE Sensitive     GENTAMICIN <=1 SENSITIVE Sensitive     TRIMETH/SULFA <=20 SENSITIVE Sensitive      * FEW SERRATIA MARCESCENS  Blood Culture ID Panel (Reflexed)     Status: Abnormal   Collection Time: 02/09/20  3:55 AM  Result Value Ref Range Status   Enterococcus faecalis NOT DETECTED NOT DETECTED Final   Enterococcus Faecium NOT DETECTED NOT DETECTED Final   Listeria monocytogenes NOT DETECTED NOT DETECTED Final   Staphylococcus species NOT DETECTED NOT DETECTED Final   Staphylococcus aureus (BCID) NOT DETECTED NOT DETECTED Final   Staphylococcus epidermidis NOT DETECTED NOT DETECTED Final   Staphylococcus lugdunensis NOT DETECTED NOT DETECTED Final   Streptococcus species NOT DETECTED NOT DETECTED Final   Streptococcus agalactiae NOT DETECTED NOT DETECTED Final   Streptococcus pneumoniae NOT DETECTED NOT DETECTED Final   Streptococcus pyogenes NOT DETECTED NOT DETECTED Final   A.calcoaceticus-baumannii NOT DETECTED NOT DETECTED Final   Bacteroides fragilis NOT DETECTED NOT DETECTED Final   Enterobacterales DETECTED (A) NOT DETECTED Final    Comment: Enterobacterales represent a large  order of gram negative bacteria, not a single organism. CRITICAL RESULT CALLED TO, READ BACK BY AND VERIFIED WITH: JASON ROBBINS @0057  02/10/20 RH    Enterobacter cloacae complex NOT DETECTED NOT DETECTED Final   Escherichia coli NOT DETECTED NOT DETECTED Final   Klebsiella aerogenes NOT DETECTED NOT DETECTED Final   Klebsiella oxytoca NOT DETECTED NOT DETECTED Final   Klebsiella pneumoniae NOT DETECTED NOT DETECTED Final   Proteus species NOT DETECTED NOT DETECTED Final   Salmonella species NOT DETECTED NOT DETECTED Final   Serratia marcescens DETECTED (A) NOT DETECTED Final    Comment: CRITICAL RESULT CALLED TO, READ BACK BY AND VERIFIED WITH: JASON ROBBINS @ 7001 02/10/2020 RH    Haemophilus influenzae NOT DETECTED NOT DETECTED Final   Neisseria meningitidis NOT DETECTED NOT DETECTED Final   Pseudomonas aeruginosa NOT DETECTED NOT DETECTED Final   Stenotrophomonas maltophilia NOT DETECTED NOT  DETECTED Final   Candida albicans NOT DETECTED NOT DETECTED Final   Candida auris NOT DETECTED NOT DETECTED Final   Candida glabrata NOT DETECTED NOT DETECTED Final   Candida krusei NOT DETECTED NOT DETECTED Final   Candida parapsilosis NOT DETECTED NOT DETECTED Final   Candida tropicalis NOT DETECTED NOT DETECTED Final   Cryptococcus neoformans/gattii NOT DETECTED NOT DETECTED Final   CTX-M ESBL NOT DETECTED NOT DETECTED Final   Carbapenem resistance IMP NOT DETECTED NOT DETECTED Final   Carbapenem resistance KPC NOT DETECTED NOT DETECTED Final   Carbapenem resistance NDM NOT DETECTED NOT DETECTED Final   Carbapenem resist OXA 48 LIKE NOT DETECTED NOT DETECTED Final   Carbapenem resistance VIM NOT DETECTED NOT DETECTED Final    Comment: Performed at [facility], [address] [place], [place] [address]  Aerobic/Anaerobic Culture (surgical/deep wound)     Status: None (Preliminary result)   Collection Time: 02/09/20 10:48 AM   Specimen: Abscess  Result Value Ref Range Status   Specimen Description ABSCESS LEFT ARM  Final   Special Requests NONE  Final   Gram Stain   Final    MODERATE WBC PRESENT,BOTH PMN AND MONONUCLEAR MODERATE GRAM POSITIVE RODS RARE GRAM NEGATIVE RODS RARE YEAST Performed at [facility], [address], [place], [place] [address]    Culture   Final    FEW SERRATIA MARCESCENS FEW CANDIDA ALBICANS NO ANAEROBES ISOLATED; CULTURE IN PROGRESS FOR 5 DAYS    Report Status PENDING  Incomplete   Organism ID, Bacteria SERRATIA MARCESCENS  Final      Susceptibility   Serratia marcescens - MIC*    CEFAZOLIN >=64 RESISTANT Resistant     CEFEPIME <=0.12 SENSITIVE Sensitive     CEFTAZIDIME <=1 SENSITIVE Sensitive     CEFTRIAXONE <=0.25 SENSITIVE Sensitive     CIPROFLOXACIN <=0.25 SENSITIVE Sensitive     GENTAMICIN <=1 SENSITIVE Sensitive     TRIMETH/SULFA <=20 SENSITIVE Sensitive     * FEW SERRATIA MARCESCENS  CULTURE, BLOOD (ROUTINE X 2) w Reflex  to ID Panel     Status: None (Preliminary result)   Collection Time: 02/11/20  1:17 PM   Specimen: BLOOD  Result Value Ref Range Status   Specimen Description BLOOD BLOOD LEFT HAND  Final   Special Requests   Final    BOTTLES DRAWN AEROBIC AND ANAEROBIC Blood Culture adequate volume   Culture   Final    NO GROWTH 2 DAYS Performed at [facility], [address], [place], [place] [address]    Report Status PENDING  Incomplete  RN Pressure Injury Documentation:     Estimated body mass index is 20.48 kg/m as calculated from the following:   Height as of this encounter: 5' 8"  (1.727 m).   Weight as of this encounter: 61.1 kg.  Malnutrition Type:  Nutrition Problem: Severe Malnutrition Etiology: social / environmental circumstances (IV drug use, homelessness, food insecurity)   Malnutrition Characteristics:  Signs/Symptoms: severe fat depletion, moderate muscle depletion, severe muscle depletion   Nutrition Interventions:  Interventions: Refer to RD note for recommendations   Radiology Studies: No results found.  Scheduled Meds: . acidophilus  2 capsule Oral TID  . amLODipine  10 mg Oral Daily  . cloNIDine  0.05 mg Oral BID  . DULoxetine  20 mg Oral Daily  . enoxaparin (LOVENOX) injection  40 mg Subcutaneous Q24H  . fluconazole  200 mg Oral Daily  . gabapentin  600 mg Oral TID  . haloperidol lactate  0.5 mg Intramuscular TID   Or  . haloperidol  0.5 mg Oral TID  . hydrocortisone cream   Topical BID  . insulin aspart  0-20 Units Subcutaneous Q4H  . insulin aspart protamine- aspart  35 Units Subcutaneous BID WC  . insulin starter kit- pen needles  1 kit Other Once  . levofloxacin  750 mg Oral Daily  . metoprolol tartrate  5 mg Intravenous Once  . multivitamin with minerals  1 tablet Oral Daily  . Ensure Max Protein  11 oz Oral BID BM  . sodium chloride flush  10-40 mL Intracatheter Q12H   Continuous Infusions: . sodium chloride 250 mL (02/11/20  1720)    LOS: 4 days   Kerney Elbe, DO [facility] PAGER is on AMION  If 7PM-7AM, please contact night-coverage [email or web address]

## 2020-02-14 ENCOUNTER — Other Ambulatory Visit: Payer: Self-pay | Admitting: Internal Medicine

## 2020-02-14 ENCOUNTER — Encounter: Payer: Self-pay | Admitting: Internal Medicine

## 2020-02-14 DIAGNOSIS — B1710 Acute hepatitis C without hepatic coma: Secondary | ICD-10-CM

## 2020-02-14 DIAGNOSIS — R945 Abnormal results of liver function studies: Secondary | ICD-10-CM

## 2020-02-14 LAB — CBC WITH DIFFERENTIAL/PLATELET
Abs Immature Granulocytes: 0.03 10*3/uL (ref 0.00–0.07)
Basophils Absolute: 0.1 10*3/uL (ref 0.0–0.1)
Basophils Relative: 1 %
Eosinophils Absolute: 0.3 10*3/uL (ref 0.0–0.5)
Eosinophils Relative: 4 %
HCT: 43.1 % (ref 39.0–52.0)
Hemoglobin: 14.8 g/dL (ref 13.0–17.0)
Immature Granulocytes: 0 %
Lymphocytes Relative: 30 %
Lymphs Abs: 2.2 10*3/uL (ref 0.7–4.0)
MCH: 30.7 pg (ref 26.0–34.0)
MCHC: 34.3 g/dL (ref 30.0–36.0)
MCV: 89.4 fL (ref 80.0–100.0)
Monocytes Absolute: 0.5 10*3/uL (ref 0.1–1.0)
Monocytes Relative: 7 %
Neutro Abs: 4.2 10*3/uL (ref 1.7–7.7)
Neutrophils Relative %: 58 %
Platelets: 289 10*3/uL (ref 150–400)
RBC: 4.82 MIL/uL (ref 4.22–5.81)
RDW: 12.9 % (ref 11.5–15.5)
WBC: 7.2 10*3/uL (ref 4.0–10.5)
nRBC: 0 % (ref 0.0–0.2)

## 2020-02-14 LAB — CULTURE, BLOOD (ROUTINE X 2)

## 2020-02-14 LAB — COMPREHENSIVE METABOLIC PANEL
ALT: 51 U/L — ABNORMAL HIGH (ref 0–44)
AST: 82 U/L — ABNORMAL HIGH (ref 15–41)
Albumin: 3.2 g/dL — ABNORMAL LOW (ref 3.5–5.0)
Alkaline Phosphatase: 89 U/L (ref 38–126)
Anion gap: 7 (ref 5–15)
BUN: 14 mg/dL (ref 6–20)
CO2: 26 mmol/L (ref 22–32)
Calcium: 9.3 mg/dL (ref 8.9–10.3)
Chloride: 106 mmol/L (ref 98–111)
Creatinine, Ser: 0.4 mg/dL — ABNORMAL LOW (ref 0.61–1.24)
GFR calc Af Amer: >60 mL/min (ref >60)
GFR calc non Af Amer: >60 mL/min (ref >60)
Glucose, Bld: 111 mg/dL — ABNORMAL HIGH (ref 70–99)
Potassium: 3.7 mmol/L (ref 3.5–5.1)
Sodium: 139 mmol/L (ref 135–145)
Total Bilirubin: 0.4 mg/dL (ref 0.3–1.2)
Total Protein: 7.4 g/dL (ref 6.5–8.1)

## 2020-02-14 LAB — GLUCOSE, CAPILLARY
Glucose-Capillary: 172 mg/dL — ABNORMAL HIGH (ref 70–99)
Glucose-Capillary: 193 mg/dL — ABNORMAL HIGH (ref 70–99)
Glucose-Capillary: 284 mg/dL — ABNORMAL HIGH (ref 70–99)
Glucose-Capillary: 298 mg/dL — ABNORMAL HIGH (ref 70–99)

## 2020-02-14 LAB — AEROBIC/ANAEROBIC CULTURE W GRAM STAIN (SURGICAL/DEEP WOUND)

## 2020-02-14 LAB — MAGNESIUM: Magnesium: 2.1 mg/dL (ref 1.7–2.4)

## 2020-02-14 LAB — PHOSPHORUS: Phosphorus: 3.4 mg/dL (ref 2.5–4.6)

## 2020-02-14 MED ORDER — HYDROCORTISONE 1 % EX CREA: TOPICAL_CREAM | Freq: Two times a day (BID) | CUTANEOUS | 0 refills | Status: DC

## 2020-02-14 MED ORDER — LOPERAMIDE HCL 2 MG PO CAPS: 2.0000 mg | ORAL_CAPSULE | ORAL | 0 refills | Status: DC | PRN

## 2020-02-14 MED ORDER — INSULIN STARTER KIT- PEN NEEDLES (ENGLISH): 1.0000 | Freq: Once | 0 refills | Status: AC

## 2020-02-14 MED ORDER — CLONIDINE HCL 0.1 MG PO TABS: 0.0500 mg | ORAL_TABLET | Freq: Two times a day (BID) | ORAL | 0 refills | Status: DC

## 2020-02-14 MED ORDER — METHOCARBAMOL 500 MG PO TABS: 500.0000 mg | ORAL_TABLET | Freq: Three times a day (TID) | ORAL | 0 refills | Status: DC | PRN

## 2020-02-14 MED ORDER — FLUCONAZOLE 200 MG PO TABS: 200.0000 mg | ORAL_TABLET | Freq: Every day | ORAL | 0 refills | Status: AC

## 2020-02-14 MED ORDER — TRIHEXYPHENIDYL HCL 2 MG PO TABS
1.0000 mg | ORAL_TABLET | Freq: Two times a day (BID) | ORAL | 0 refills | Status: DC
Start: 2020-02-14 — End: 2020-10-20

## 2020-02-14 MED ORDER — INSULIN PEN NEEDLE 32G X 4 MM MISC: 1.0000 | Freq: Two times a day (BID) | 0 refills | Status: DC

## 2020-02-14 MED ORDER — NAPROXEN 500 MG PO TABS: 500.0000 mg | ORAL_TABLET | Freq: Two times a day (BID) | ORAL | 0 refills | Status: AC | PRN

## 2020-02-14 MED ORDER — DICYCLOMINE HCL 20 MG PO TABS: 20.0000 mg | ORAL_TABLET | Freq: Four times a day (QID) | ORAL | 0 refills | Status: DC | PRN

## 2020-02-14 MED ORDER — ONDANSETRON 4 MG PO TBDP: 4.0000 mg | ORAL_TABLET | Freq: Four times a day (QID) | ORAL | 0 refills | Status: DC | PRN

## 2020-02-14 MED ORDER — DULOXETINE HCL 20 MG PO CPEP: 20.0000 mg | ORAL_CAPSULE | Freq: Every day | ORAL | 0 refills | Status: DC

## 2020-02-14 MED ORDER — TRIHEXYPHENIDYL HCL 2 MG PO TABS
1.0000 mg | ORAL_TABLET | Freq: Two times a day (BID) | ORAL | 0 refills | Status: DC
Start: 2020-02-14 — End: 2020-02-14

## 2020-02-14 MED ORDER — AMLODIPINE BESYLATE 10 MG PO TABS
10.0000 mg | ORAL_TABLET | Freq: Every day | ORAL | 0 refills | Status: DC
Start: 2020-02-14 — End: 2020-10-20

## 2020-02-14 MED ORDER — ENSURE MAX PROTEIN PO LIQD
11.0000 [oz_av] | Freq: Two times a day (BID) | ORAL | 0 refills | Status: DC
Start: 2020-02-14 — End: 2021-12-02

## 2020-02-14 MED ORDER — INSULIN LISPRO PROT & LISPRO (75-25 MIX) 100 UNIT/ML KWIKPEN: 35.0000 [IU] | PEN_INJECTOR | Freq: Two times a day (BID) | SUBCUTANEOUS | 1 refills | Status: DC

## 2020-02-14 MED ORDER — RISAQUAD PO CAPS: 2.0000 | ORAL_CAPSULE | Freq: Three times a day (TID) | ORAL | 0 refills | Status: DC

## 2020-02-14 MED ORDER — ADULT MULTIVITAMIN W/MINERALS CH: 1.0000 | ORAL_TABLET | Freq: Every day | ORAL | 0 refills | Status: DC

## 2020-02-14 MED ORDER — NAPROXEN 500 MG PO TABS
500.0000 mg | ORAL_TABLET | Freq: Two times a day (BID) | ORAL | 0 refills | Status: DC | PRN
Start: 2020-02-14 — End: 2020-02-14

## 2020-02-14 MED ORDER — NICOTINE POLACRILEX 2 MG MT GUM: 2.0000 mg | CHEWING_GUM | OROMUCOSAL | 0 refills | Status: DC | PRN

## 2020-02-14 MED ORDER — LEVOFLOXACIN 750 MG PO TABS
750.0000 mg | ORAL_TABLET | Freq: Every day | ORAL | 0 refills | Status: DC
Start: 2020-02-14 — End: 2020-10-20

## 2020-02-14 MED ORDER — INSULIN ASPART PROT & ASPART (70-30 MIX) 100 UNIT/ML PEN: 35.0000 [IU] | PEN_INJECTOR | Freq: Two times a day (BID) | SUBCUTANEOUS | 0 refills | Status: DC

## 2020-02-14 MED ORDER — FLUCONAZOLE 200 MG PO TABS: 200.0000 mg | ORAL_TABLET | Freq: Every day | ORAL | 0 refills | Status: DC

## 2020-02-14 MED ORDER — HALOPERIDOL 0.5 MG PO TABS
0.5000 mg | ORAL_TABLET | Freq: Three times a day (TID) | ORAL | 0 refills | Status: DC
Start: 2020-02-14 — End: 2020-10-20

## 2020-02-14 MED ORDER — HALOPERIDOL 0.5 MG PO TABS
0.5000 mg | ORAL_TABLET | Freq: Three times a day (TID) | ORAL | 0 refills | Status: DC
Start: 2020-02-14 — End: 2020-02-14

## 2020-02-14 NOTE — Progress Notes (Signed)
Patient was discharged but could not pick up any of his scripts as [facility] was not open. Originally discharged with Humalog 75/25 at that clinic but they are not open. [facility] does not carry 75/25 and has Novolog 70/30. Will send script for 70/30 35 units BID but once [facility] opens Monday 02/16/20 he will need to pick up his Humalog 75/25 35 units BID and take that and discontinue the Novolog 70/30 BID. Case Management Aware and have informed the patient.

## 2020-02-14 NOTE — Progress Notes (Signed)
DISCHARGE NOTE:  Pt given discharge instructions, pt verbalized understanding. Pt wheeled to car by staff.  

## 2020-02-14 NOTE — TOC Transition Note (Signed)
Transition of Care [facility]) - CM/SW Discharge Note   Patient Details  Name: Earl Moreno MRN: [id number] Date of Birth: [birth year]  [facility] CM/SW Contact:  Lockie Pares, RN Phone Number: 02/14/2020, 2:32 PM   Clinical Narrative:     MATCH letter done for patient to obtain discharge medications. Faxed to [facility] in [place] [phone] Overrode all medication, could not over ride needles for pen. Patient aware via Adelina Mings .  Called [facility] as well to make sure they received fax.   Barriers to Discharge: Continued Medical Work up   Patient Goals and CMS Choice        Discharge Placement                       Discharge Plan and Services                                     Social Determinants of Health (SDOH) Interventions     Readmission Risk Interventions Readmission Risk Prevention Plan 06/07/2018  Post Dischage Appt Complete  Medication Screening Complete  Transportation Screening Complete  PCP follow-up Complete  Some recent data might be hidden

## 2020-02-14 NOTE — Discharge Summary (Signed)
Physician Discharge Summary  D'ARCY ABRAHA ZOX:[id number] DOB: 07-Jul-1965 DOA: 02/09/2020  PCP: Patient, No Pcp Per  Admit date: 02/09/2020 Discharge date: 02/14/2020  Admitted From: Home Disposition:  Home  Recommendations for Outpatient Follow-up:  1. Follow up and establish with PCP in 1-2 weeks; Have PCP order RUQ for Abnormal LFTs 2. Follow up with [facility] and Psychiatry in the outpatient setting 3. Follow up with General Surgery Dr. Celine Ahr within 1 week 4. Follow up with Infectious Diseases for Treatment of Hep C if so desired 5. Please obtain CMP/CBC, Mag, Phos in one week 6. Please follow up on the following pending results:  Home Health: No  Equipment/Devices: None    Discharge Condition: Stable CODE STATUS: FULL CODE Diet recommendation: Heart Healthy Carb Modified Diet  Brief/Interim Summary: HPI per Dr. Royce Macadamia Agbata on 02/09/20 Samule L Danielsis a 54 y.o.malewith medical history significant forIV drug use(patient admits to being a heroin addict)and uses intranasal cocaine as well. He was brought into the emergency room by his wife for evaluation of multiple abscesses involving both left and right wrist as well as left forearm. He has had these abscesses for about a week and the one on his left forearm appears to be draining. His wife states that he has had about a 30 pound weight loss over the last couple of weeks associated with increasing thirst, urinary frequency and worsening fatigue. He denies having any fever chills, he has no shortness of breath, no abdominal pain, no chest pain, no changes in his bowel habits or any urinary symptoms. Patient has a strong family history of diabetes mellitus His last IV drug use was 1 day prior to his admission. Labs show sodium 124, potassium 4.2, chloride 85, bicarb 26, serum glucose 828, BUN 9, creatinine 0.79, calcium 9.3, albumin 3.7, AST 21, ALT 23, white count 9.4, hemoglobin 15.6, hematocrit 43.7, MCV 84.7, RDW 12.5,  platelet count 288 Chest x-ray reviewed by me shows no infiltrate or effusion Left forearm x-ray showsno soft tissue gas, retained needle, or bony erosion. Twelve-lead EKG reviewed by me shows sinus tachycardia   ED Course:Patient is a 54 year old Caucasian male who is a heroin addict and who presented to the ER for evaluation of multiple abscesses on both wrist as well as his left forearm. He also gives a history of significant weight loss, increasing thirst, urinary frequency and increased fatigue. His labs revealed a glucose of greater than 800 and normal serum bicarbonate level. Patient was started on aggressive IV fluid hydration and received a dose of IV vancomycin. He is currently on regular insulin infusion will be admitted to the hospital for further evaluation.  **Interim History General surgery was consulted and he had an incision and drainage of the abscess of the left forearm.  Initial blood cultures were growing  Serratia marcescens and wound culture from 9/20 started growing gram-positive rods and gram-negative rods and antibiotics were switched to cefepime.  Patient threatened to leave AMA the day before yesterday and was very belligerent with staff and refused labs yesterday morning but He was finally agreeable to obtain labs. He has a lactic acidosis now and we have discontinued Metformin given him normal saline bolus and maintenance IV fluids.  Patient continues to ask for increase in his pain medication stating that he is a heroin addict and that he is getting "dope sick" and feels like he is hallucinating however states that he appears comfortable in no acute distress.  We will continue his current pain regimen  at this time as well as his Toradol and have to be judicious with his IV narcotics given his IV drug abuse.  Culture sensitivities are still pending however the likely Gram stain from the wound culture and so we will change him over to IV Levaquin in anticipation of  discharge.  Given that has wound Gram stain and culture show some Candida albicans we will also give him some fluconazole 400 mg times once and then will continue for 4 more days p.o.  [facility] was consulted given his history and Dr. Davis Gourd made some recommendations and started the patient on Cymbalta, Artane and start him on a clonidine taper as well for his opiate withdrawal.  Discharge Diagnoses:  Principal Problem:   Hyperglycemia due to type 2 diabetes mellitus ([place]) Active Problems:   New onset type 2 diabetes mellitus ([place])   Hyperglycemia due to diabetes mellitus ([place])   Abscess   Heroin addiction ([place])   Hyponatremia   Essential hypertension   Protein-calorie malnutrition, severe   Abscess of multiple sites   Hyperosmolar hyperglycemic state (HHS) ([place])   Polysubstance abuse ([place])  Hyperglycemia due to type 2 diabetes mellitus NEWonset diabetes mellitus type 2 -Anion gap closed at 12, closed, DKA ruled out -Patient appears to have new onset diabetes mellitus and presents with significant weight loss, increased thirst, urinary frequency and fatigue -Initialblood sugar was greater than 800g/dlCBG's are improving however unfortunately did not receive any sliding scale last night and blood sugars this morning were elevated and went over 500 and was 535.  Now CBGs are ranging from 166-458 -Continue aggressive IV fluid hydration; resumed IV fluid hydration given his lactic acidosis -Continued insulin drip>>>improving anticipating discontinuing drip -Initiating long-acting insulin 20 units nightly this is a change at 70/30 and will need to be changed to 75/25 in the outpatient setting, SSI coverage -We will initiate Metformin however it caused a lactic acidosis and will stop now -A1c: Greater than 15.5 -Patient will require diabetic education and nutrition evaluation -CBG's ranging from 111-298 now and have much improved -Patient was given NovoLog 70/30 while he was  hospitalized 35 units twice daily and the plan was to change him to 75/25 Humalog FlexPen.  He was discharged and unfortunately the med management clinic was closed so he was unable to pick up his Humalog FlexPen.  Scripts were called in to [facility] letter was given to the patient for all scripts however Humalog 75/25 was way too expensive for the patient at the cost over $700 so the pharmacy requested to have it changed to 70 3035 units twice daily.  Patient will be taking 70/30 NovoLog for 2 days and will go Monday to pick up his 75/25 35 units twice daily and start taking that and discontinue the NovoLog 70/30 at that time. -Sliding scale insulin has been changed to every 4 resistant scale and CBGs were better controlled -Follow-up with outpatient diabetes education coronary and follow-up and establish with PCP as paperwork have been given to the patient -TOC has provided the patient with a glucose meter  Bacteremia/multiple Abscesses SIRS/sepsis ruled out -1 out of 2 blood cultures growing Serratia marcescens susceptibilities to follow still; wound culture growing out Serratia marcescens which is relatively pansensitive -Patient is initiated on IV antibiotics of cefepime -Doxycycline for possible MRSA coverage now discontinued -Repeating blood cultures in the morning of 02/11/2020 and this is fine no growth to date -Patient with skin/soft tissue infection involving both wrists and left forearm -General surgery consult  following, continue wound care, no plan for I&D at this time -Continue local wound care daily + prn; Pack with iodoform gauze, cover, secure with tape--> -Appreciate surgery evaluation and he will need at least 10 to 14 days of antibiotics when sensitivities are obtained -General surgery recommending continuing daily wound care as outlined and de-escalating antibiotics per culture sensitivities and will see him in the outpatient setting if necessary -Awaiting on final  sensitivities for his blood cultures but likely they are the same as his wound culture.  His wound culture also did grow out some Candida albicans so we will give him some fluconazole p.o. -We will de-escalate IV Levaquin  and changed to p.o. for 7 more days -Complaining of itching near abscess sites and will add Hydrocortisone 1% cream Topically Daily and Diphenhydramine 12.5 mg IV q6hprn Itching  -Follow-up with general surgery in outpatient setting  Hypertension-Uncontrolled -We will start patient on amlodipine 10 mg daily and continue at discharge -Monitoring very closely, titrating meds, as needed hydralazine -Continue to Monitor BP per Protocol -Last BP reading was  118/72  Hyponatremia -Transient secondary to significant hyperglycemia -Monitoring sodium level closely, improving with IV fluid hydration and correction of hyperglycemia -Secondary to significant hyperglycemia -Sodium is 139 this morning -Continue to monitor and repeat BMP in a.m.  Polysubstance Dependence -Patient is urine drug screen positive for heroine/cocaine/opiates -he had some opiate withdrawal symptoms and psychiatry was consulted and started him on the COWS protocol and he is on a clonidine 0.05 twice daily for 4 doses -Patient has been counseled on the need to abstain from illicit drug use -Judicious use of opiates given his IV drug abuse past -C/w Nicotine Polacrilex 2 mg po PRN smoking Cessation  -Psychiatry recommending avoiding benzos at discharge recommending starting Cymbalta 20 mg p.o. daily, continuing Haldol 0.5 mg p.o. 3 times daily, and starting Artane -Dr. Janese Banks recommended dual diagnosis inpatient care and patient did not have enough insight to go to the outpatient substance program and the best he feels that we can do is refer the patient to [facility] for outpatient care for at least 2 medication refills -Follow-up with psychiatry in outpatient setting  HepatitisC Abnormal LFTS -Continue  monitoring LFTs; AST is slightly elevated and went from 32 -> 57 -> 82 and ALT went from 36 -> 51 -Hepatitis C reactive,quantitive RNA hepatitis C ordered and was 2,260,000 and HCV Quantitative Log was 6.354 -Right upper quadrant ultrasound to further evaluate and patient deferred this and did not want to stay n.p.o. for 6 hours to get it done and states that he will get done in the outpatient setting -Likely need to follow-up as an outpatient with infectious disease  Lactic Acidosis -Lactic acid level has trended all the way to 4.2 the day before yesterday and this morning 1.8 -Given a liter of normal saline bolus -We will stop his Metformin and only continue Insulin at D/C -We will stop IV maintenance fluids. -Continue monitor and trend lactic acid levels if patient allows and repeat in the AM  Hypophosphatemia -Patient's phosphorus level was 2.0 and replete with IV K-Phos 20 mmol the day before yesterday and is now improved to 3.6 yesterday and today was 3.4 -Continue monitor and replete as necessary  Severe protein calorie malnutrition -In the setting of his heroin abuse -Nutritionist consulted for further evaluation and recommendations and recommending continuing supplements -C/w Ensure Maxx 11 ounce po BID at D/C  Discharge Instructions  Discharge Instructions    Activity as tolerated -  No restrictions   Complete by: As directed    Call MD for:  difficulty breathing, headache or visual disturbances   Complete by: As directed    Call MD for:  extreme fatigue   Complete by: As directed    Call MD for:  hives   Complete by: As directed    Call MD for:  persistant dizziness or light-headedness   Complete by: As directed    Call MD for:  persistant nausea and vomiting   Complete by: As directed    Call MD for:  redness, tenderness, or signs of infection (pain, swelling, redness, odor or green/yellow discharge around incision site)   Complete by: As directed    Call MD for:   severe uncontrolled pain   Complete by: As directed    Call MD for:  temperature >100.4   Complete by: As directed    Diet - low sodium heart healthy   Complete by: As directed    Diet - low sodium heart healthy   Complete by: As directed    Diet Carb Modified   Complete by: As directed    Discharge instructions   Complete by: As directed    You were cared for by a hospitalist during your hospital stay. If you have any questions about your discharge medications or the care you received while you were in the hospital after you are discharged, you can call the unit and ask to speak with the hospitalist on call if the hospitalist that took care of you is not available. Once you are discharged, your primary care physician will handle any further medical issues. Please note that NO REFILLS for any discharge medications will be authorized once you are discharged, as it is imperative that you return to your primary care physician (or establish a relationship with a primary care physician if you do not have one) for your aftercare needs so that they can reassess your need for medications and monitor your lab values.  Follow up with PCP, Psychiatry, and Diabetes Education Coordinator in the outpatient setting. Take all medications as prescribed. If symptoms change or worsen please return to the ED for evaluation   Discharge wound care:   Complete by: As directed    Per Gen Surgery: Wound care  Every shift      Comm ents: 1) Pack wound with Iodoform gauze 2) Cover with 4x4 gauze 3) Secure with Kerlix or Wrapped Gauze  This should be done once daily   Increase activity slowly   Complete by: As directed    Increase activity slowly   Complete by: As directed      Allergies as of 02/14/2020   No Known Allergies     Medication List    TAKE these medications   acidophilus Caps capsule Take 2 capsules by mouth 3 (three) times daily.   amLODipine 10 MG tablet Commonly known as: NORVASC Take  1 tablet (10 mg total) by mouth daily.   cloNIDine 0.1 MG tablet Commonly known as: CATAPRES Take 0.5 tablets (0.05 mg total) by mouth 2 (two) times daily for 2 doses.   dicyclomine 20 MG tablet Commonly known as: BENTYL Take 1 tablet (20 mg total) by mouth every 6 (six) hours as needed for spasms (abdominal cramping).   DULoxetine 20 MG capsule Commonly known as: CYMBALTA Take 1 capsule (20 mg total) by mouth daily.   Ensure Max Protein Liqd Take 330 mLs (11 oz total) by mouth 2 (two) times daily  between meals.   fluconazole 200 MG tablet Commonly known as: DIFLUCAN Take 1 tablet (200 mg total) by mouth daily for 3 days. Start taking on: February 15, 2020   haloperidol 0.5 MG tablet Commonly known as: HALDOL Take 1 tablet (0.5 mg total) by mouth 3 (three) times daily.   hydrocortisone cream 1 % Apply topically 2 (two) times daily.   Insulin Pen Needle 32G X 4 MM Misc 1 Container by Does not apply route in the morning and at bedtime.   insulin starter kit- pen needles Misc 1 kit by Other route once for 1 dose.   insulin starter kit- pen needles Misc 1 kit by Other route once for 1 dose.   levofloxacin 750 MG tablet Commonly known as: LEVAQUIN Take 1 tablet (750 mg total) by mouth daily.   loperamide 2 MG capsule Commonly known as: IMODIUM Take 1-2 capsules (2-4 mg total) by mouth as needed for diarrhea or loose stools (diarrhea).   methocarbamol 500 MG tablet Commonly known as: ROBAXIN Take 1 tablet (500 mg total) by mouth every 8 (eight) hours as needed for muscle spasms.   multivitamin with minerals Tabs tablet Take 1 tablet by mouth daily.   naproxen 500 MG tablet Commonly known as: NAPROSYN Take 1 tablet (500 mg total) by mouth 2 (two) times daily as needed for up to 5 days (aching, pain, or discomfort).   nicotine polacrilex 2 MG gum Commonly known as: NICORETTE Take 1 each (2 mg total) by mouth as needed for smoking cessation.   ondansetron 4 MG  disintegrating tablet Commonly known as: ZOFRAN-ODT Take 1 tablet (4 mg total) by mouth every 6 (six) hours as needed for nausea or vomiting.   trihexyphenidyl 2 MG tablet Commonly known as: ARTANE Take 0.5 tablets (1 mg total) by mouth 2 (two) times daily with a meal.            Discharge Care Instructions  (From admission, onward)         Start     Ordered   02/14/20 0000  Discharge wound care:       Comments: Per Gen Surgery: Wound care  Every shift      Comm ents: 1) Pack wound with Iodoform gauze 2) Cover with 4x4 gauze 3) Secure with Kerlix or Wrapped Gauze  This should be done once daily   02/14/20 1208        **OF NOTE MAR DOES NOT REFLECT HIS HUMALOG 75/25 or his NOVOLOG 70/30 as it had to be called into a different PHARMACY. PATIENT IS BEING Discharged on Insulin. See Separate note in the chart as well as note by TOC  No Known Allergies  Consultations:  General Surgery  Psychiatry  Procedures/Studies: DG Chest 2 View  Result Date: 02/09/2020 CLINICAL DATA:  Tachycardia EXAM: CHEST - 2 VIEW COMPARISON:  01/06/2015 FINDINGS: The heart size and mediastinal contours are within normal limits. Both lungs are clear. The visualized skeletal structures are unremarkable. IMPRESSION: No active cardiopulmonary disease. Electronically Signed   By: Fidela Salisbury MD   On: 02/09/2020 04:55   DG Forearm Left  Result Date: 02/09/2020 CLINICAL DATA:  Intravenous drug abuse.  Abscess EXAM: LEFT FOREARM - 2 VIEW COMPARISON:  02/10/2012 FINDINGS: Focal soft tissue swelling along the proximal ventral forearm. No soft tissue emphysema is seen. No metallic foreign body to suggest retained needle fragment. A vague high-density over this area is nonspecific. No bony erosion or elbow joint effusion. IMPRESSION: History of abscess  with focal soft tissue swelling at the ventral and proximal forearm. No soft tissue gas, retained needle, or bony erosion. Electronically Signed   By:  Monte Fantasia M.D.   On: 02/09/2020 04:55   DG Wrist Complete Left  Result Date: 02/09/2020 CLINICAL DATA:  Left hand abscess. EXAM: LEFT WRIST - COMPLETE 3+ VIEW COMPARISON:  None. FINDINGS: There is no evidence of fracture or dislocation. There is no evidence of arthropathy or other focal bone abnormality. Soft tissues are unremarkable. IMPRESSION: Negative. Electronically Signed   By: Monte Fantasia M.D.   On: 02/09/2020 04:53   DG Wrist Complete Right  Result Date: 02/09/2020 CLINICAL DATA:  Left hand abscess.  IV drug abuse. EXAM: RIGHT WRIST - COMPLETE 3+ VIEW COMPARISON:  None. FINDINGS: There is no evidence of fracture or dislocation. Possible mild skin irregularity to the dorsal right hand. Negative for bony erosion or soft tissue gas. Ganglia/cystic type lucencies at the scaphoid and hamate, incidental. IMPRESSION: No acute osseous finding or soft tissue emphysema. Electronically Signed   By: Monte Fantasia M.D.   On: 02/09/2020 04:53     Subjective: And examined at bedside and was wanting to rest.  Did not want to go under his ultrasound given that he wanted to eat.  He denies any complaints at this time and feels well.  Blood sugars are much better controlled.  He is stable to be discharged home and went to follow-up and establish with PCP, Psychiatry as well as general surgeon outpatient setting.  Discharge Exam: Vitals:   02/14/20 0824 02/14/20 1345  BP: 120/70 118/72  Pulse: 78 74  Resp: 16 16  Temp: 98.3 F (36.8 C) 98.2 F (36.8 C)  SpO2: 93% 95%   Vitals:   02/13/20 1537 02/13/20 2348 02/14/20 0824 02/14/20 1345  BP: 128/71 119/84 120/70 118/72  Pulse: 70 72 78 74  Resp: 17 18 16 16   Temp: 97.9 F (36.6 C) 97.9 F (36.6 C) 98.3 F (36.8 C) 98.2 F (36.8 C)  TempSrc:  Oral Oral Oral  SpO2: 99% 96% 93% 95%  Weight:      Height:       General: Pt is alert, awake, not in acute distress Cardiovascular:  slightly bradycardic but does have a regular rhythm,  S1/S2 +, no rubs, no gallops Respiratory: Diminished bilaterally, no wheezing, no rhonchi Abdominal: Soft, NT, ND, bowel sounds + Extremities: no edema, no cyanosis; left arm is wrapped and he does have some multiple abscess sites on his wrist.  Tattoos diffusely throughout his body.  The results of significant diagnostics from this hospitalization (including imaging, microbiology, ancillary and laboratory) are listed below for reference.    Microbiology: Recent Results (from the past 240 hour(s))  SARS Coronavirus 2 by RT PCR (hospital order, performed in [facility] hospital lab) Nasopharyngeal Nasopharyngeal Swab     Status: None   Collection Time: 02/09/20  3:45 AM   Specimen: Nasopharyngeal Swab  Result Value Ref Range Status   SARS Coronavirus 2 NEGATIVE NEGATIVE Final    Comment: (NOTE) SARS-CoV-2 target nucleic acids are NOT DETECTED.  The SARS-CoV-2 RNA is generally detectable in upper and lower respiratory specimens during the acute phase of infection. The lowest concentration of SARS-CoV-2 viral copies this assay can detect is 250 copies / mL. A negative result does not preclude SARS-CoV-2 infection and should not be used as the sole basis for treatment or other patient management decisions.  A negative result may occur with improper specimen collection /  handling, submission of specimen other than nasopharyngeal swab, presence of viral mutation(s) within the areas targeted by this assay, and inadequate number of viral copies (<250 copies / mL). A negative result must be combined with clinical observations, patient history, and epidemiological information.  Fact Sheet for Patients:   StrictlyIdeas.no  Fact Sheet for Healthcare Providers: BankingDealers.co.za  This test is not yet approved or  cleared by the [place] FDA and has been authorized for detection and/or diagnosis of SARS-CoV-2 by FDA under an Emergency Use  Authorization (EUA).  This EUA will remain in effect (meaning this test can be used) for the duration of the COVID-19 declaration under Section 564(b)(1) of the Act, 21 U.S.C. section 360bbb-3(b)(1), unless the authorization is terminated or revoked sooner.  Performed at [facility], [address] [place], [place] [address]   Blood culture (routine x 2)     Status: Abnormal   Collection Time: 02/09/20  3:55 AM   Specimen: BLOOD  Result Value Ref Range Status   Specimen Description   Final    BLOOD RIGHT ARM Performed at [facility], [address] [place], [place] [address]    Special Requests   Final    BOTTLES DRAWN AEROBIC AND ANAEROBIC Blood Culture results may not be optimal due to an inadequate volume of blood received in culture bottles Performed at [facility], [address] [place], [place] [address]    Culture  Setup Time   Final    GRAM NEGATIVE RODS ANAEROBIC BOTTLE ONLY Organism ID to follow CRITICAL RESULT CALLED TO, READ BACK BY AND VERIFIED WITH: JASON ROBBINS @ 0057 02/10/20 RH Performed at [facility], [address] [place], [place] [address]    Culture SERRATIA MARCESCENS (A)  Final   Report Status 02/14/2020 FINAL  Final   Organism ID, Bacteria SERRATIA MARCESCENS  Final      Susceptibility   Serratia marcescens - MIC*    CEFAZOLIN >=64 RESISTANT Resistant     CEFEPIME <=0.12 SENSITIVE Sensitive     CEFTAZIDIME <=1 SENSITIVE Sensitive     CEFTRIAXONE <=0.25 SENSITIVE Sensitive     CIPROFLOXACIN <=0.25 SENSITIVE Sensitive     GENTAMICIN <=1 SENSITIVE Sensitive     TRIMETH/SULFA <=20 SENSITIVE Sensitive     * SERRATIA MARCESCENS  Wound or Superficial Culture     Status: None   Collection Time: 02/09/20  3:55 AM   Specimen: Arm; Wound  Result Value Ref Range Status   Specimen Description   Final    ARM LEFT FOREARM Performed at [facility], [address], [place], [place] [address]     Special Requests   Final    NONE Performed at [facility], [address] [place], [place] [address]    Gram Stain   Final    FEW WBC PRESENT, PREDOMINANTLY PMN RARE GRAM POSITIVE RODS RARE GRAM POSITIVE COCCI Performed at [facility], [place] [address], [place], [place] [address]    Culture FEW SERRATIA MARCESCENS  Final   Report Status 02/11/2020 FINAL  Final   Organism ID, Bacteria SERRATIA MARCESCENS  Final      Susceptibility   Serratia marcescens - MIC*    CEFAZOLIN >=64 RESISTANT Resistant     CEFEPIME <=0.12 SENSITIVE Sensitive     CEFTAZIDIME <=1 SENSITIVE Sensitive     CEFTRIAXONE <=0.25 SENSITIVE Sensitive     CIPROFLOXACIN <=0.25 SENSITIVE Sensitive     GENTAMICIN <=1 SENSITIVE Sensitive  TRIMETH/SULFA <=20 SENSITIVE Sensitive     * FEW SERRATIA MARCESCENS  Blood Culture ID Panel (Reflexed)     Status: Abnormal   Collection Time: 02/09/20  3:55 AM  Result Value Ref Range Status   Enterococcus faecalis NOT DETECTED NOT DETECTED Final   Enterococcus Faecium NOT DETECTED NOT DETECTED Final   Listeria monocytogenes NOT DETECTED NOT DETECTED Final   Staphylococcus species NOT DETECTED NOT DETECTED Final   Staphylococcus aureus (BCID) NOT DETECTED NOT DETECTED Final   Staphylococcus epidermidis NOT DETECTED NOT DETECTED Final   Staphylococcus lugdunensis NOT DETECTED NOT DETECTED Final   Streptococcus species NOT DETECTED NOT DETECTED Final   Streptococcus agalactiae NOT DETECTED NOT DETECTED Final   Streptococcus pneumoniae NOT DETECTED NOT DETECTED Final   Streptococcus pyogenes NOT DETECTED NOT DETECTED Final   A.calcoaceticus-baumannii NOT DETECTED NOT DETECTED Final   Bacteroides fragilis NOT DETECTED NOT DETECTED Final   Enterobacterales DETECTED (A) NOT DETECTED Final    Comment: Enterobacterales represent a large order of gram negative bacteria, not a single organism. CRITICAL RESULT CALLED TO, READ BACK BY AND VERIFIED WITH: JASON ROBBINS  @[phone]  02/10/20 RH    Enterobacter cloacae complex NOT DETECTED NOT DETECTED Final   Escherichia coli NOT DETECTED NOT DETECTED Final   Klebsiella aerogenes NOT DETECTED NOT DETECTED Final   Klebsiella oxytoca NOT DETECTED NOT DETECTED Final   Klebsiella pneumoniae NOT DETECTED NOT DETECTED Final   Proteus species NOT DETECTED NOT DETECTED Final   Salmonella species NOT DETECTED NOT DETECTED Final   Serratia marcescens DETECTED (A) NOT DETECTED Final    Comment: CRITICAL RESULT CALLED TO, READ BACK BY AND VERIFIED WITH: JASON ROBBINS @ [id number] 02/10/2020 RH    Haemophilus influenzae NOT DETECTED NOT DETECTED Final   Neisseria meningitidis NOT DETECTED NOT DETECTED Final   Pseudomonas aeruginosa NOT DETECTED NOT DETECTED Final   Stenotrophomonas maltophilia NOT DETECTED NOT DETECTED Final   Candida albicans NOT DETECTED NOT DETECTED Final   Candida auris NOT DETECTED NOT DETECTED Final   Candida glabrata NOT DETECTED NOT DETECTED Final   Candida krusei NOT DETECTED NOT DETECTED Final   Candida parapsilosis NOT DETECTED NOT DETECTED Final   Candida tropicalis NOT DETECTED NOT DETECTED Final   Cryptococcus neoformans/gattii NOT DETECTED NOT DETECTED Final   CTX-M ESBL NOT DETECTED NOT DETECTED Final   Carbapenem resistance IMP NOT DETECTED NOT DETECTED Final   Carbapenem resistance KPC NOT DETECTED NOT DETECTED Final   Carbapenem resistance NDM NOT DETECTED NOT DETECTED Final   Carbapenem resist OXA 48 LIKE NOT DETECTED NOT DETECTED Final   Carbapenem resistance VIM NOT DETECTED NOT DETECTED Final    Comment: Performed at [facility], [address] [place], [place] [address]  Aerobic/Anaerobic Culture (surgical/deep wound)     Status: None   Collection Time: 02/09/20 10:48 AM   Specimen: Abscess  Result Value Ref Range Status   Specimen Description ABSCESS LEFT ARM  Final   Special Requests NONE  Final   Gram Stain   Final    MODERATE WBC PRESENT,BOTH PMN AND  MONONUCLEAR MODERATE GRAM POSITIVE RODS RARE GRAM NEGATIVE RODS RARE YEAST    Culture   Final    FEW SERRATIA MARCESCENS FEW CANDIDA ALBICANS NO ANAEROBES ISOLATED Performed at [facility], [address] [place], [place] [address]    Report Status 02/14/2020 FINAL  Final   Organism ID, Bacteria SERRATIA MARCESCENS  Final      Susceptibility   Serratia marcescens - MIC*  CEFAZOLIN >=64 RESISTANT Resistant     CEFEPIME <=0.12 SENSITIVE Sensitive     CEFTAZIDIME <=1 SENSITIVE Sensitive     CEFTRIAXONE <=0.25 SENSITIVE Sensitive     CIPROFLOXACIN <=0.25 SENSITIVE Sensitive     GENTAMICIN <=1 SENSITIVE Sensitive     TRIMETH/SULFA <=20 SENSITIVE Sensitive     * FEW SERRATIA MARCESCENS  CULTURE, BLOOD (ROUTINE X 2) w Reflex to ID Panel     Status: None (Preliminary result)   Collection Time: 02/11/20  1:17 PM   Specimen: BLOOD  Result Value Ref Range Status   Specimen Description BLOOD BLOOD LEFT HAND  Final   Special Requests   Final    BOTTLES DRAWN AEROBIC AND ANAEROBIC Blood Culture adequate volume   Culture   Final    NO GROWTH 3 DAYS Performed at [facility], [address] [place], [place] [address]    Report Status PENDING  Incomplete    Labs: BNP (last 3 results) No results for input(s): BNP in the last 8760 hours. Basic Metabolic Panel: Recent Labs  Lab 02/10/20 1324 02/11/20 1317 02/12/20 0917 02/13/20 0546 02/13/20 0916 02/14/20 0609  NA 134* 136 128*  --  133* 139  K 3.2* 3.5 3.8  --  4.1 3.7  CL 98 100 97*  --  99 106  CO2 24 22 25   --  24 26  GLUCOSE 279* 227* 522*  --  406* 111*  BUN 11 12 14   --  16 14  CREATININE 0.55* 0.58* 0.59*  --  0.61 0.40*  CALCIUM 8.8* 9.7 8.7*  --  8.9 9.3  MG  --  1.7 1.7 2.2  --  2.1  PHOS  --  2.0* 3.5  --  3.6 3.4   Liver Function Tests: Recent Labs  Lab 02/08/20 2129 02/11/20 1317 02/12/20 0917 02/13/20 0916 02/14/20 0609  AST 21 34 32 57* 82*  ALT 23 22 23  36 51*  ALKPHOS 128* 92 96  86 89  BILITOT 0.9 0.5 0.4 0.4 0.4  PROT 8.5* 8.3* 6.4* 7.2 7.4  ALBUMIN 3.7 3.4* 2.8* 3.2* 3.2*   No results for input(s): LIPASE, AMYLASE in the last 168 hours. No results for input(s): AMMONIA in the last 168 hours. CBC: Recent Labs  Lab 02/08/20 2129 02/11/20 1317 02/12/20 0917 02/13/20 0546 02/14/20 0609  WBC 9.4 8.3 5.6 6.7 7.2  NEUTROABS 6.5 6.1 3.7  --  4.2  HGB 15.6 16.4 13.1 14.0 14.8  HCT 43.7 47.7 38.3* 39.2 43.1  MCV 84.7 87.8 89.1 87.1 89.4  PLT 288 318 230 267 289   Cardiac Enzymes: No results for input(s): CKTOTAL, CKMB, CKMBINDEX, TROPONINI in the last 168 hours. BNP: Invalid input(s): POCBNP CBG: Recent Labs  Lab 02/13/20 2026 02/13/20 2358 02/14/20 0320 02/14/20 0825 02/14/20 1209  GLUCAP 111* 193* 172* 284* 298*   D-Dimer No results for input(s): DDIMER in the last 72 hours. Hgb A1c No results for input(s): HGBA1C in the last 72 hours. Lipid Profile No results for input(s): CHOL, HDL, LDLCALC, TRIG, CHOLHDL, LDLDIRECT in the last 72 hours. Thyroid function studies No results for input(s): TSH, T4TOTAL, T3FREE, THYROIDAB in the last 72 hours.  Invalid input(s): FREET3 Anemia work up No results for input(s): VITAMINB12, FOLATE, FERRITIN, TIBC, IRON, RETICCTPCT in the last 72 hours. Urinalysis    Component Value Date/Time   COLORURINE YELLOW (A) 02/09/2020 1153   APPEARANCEUR HAZY (A) 02/09/2020 1153   APPEARANCEUR Cloudy 12/30/2012 1018   LABSPEC 1.031 (H) 02/09/2020  1153   LABSPEC 1.017 12/30/2012 1018   PHURINE 8.0 02/09/2020 1153   GLUCOSEU >=500 (A) 02/09/2020 1153   GLUCOSEU Negative 12/30/2012 1018   HGBUR NEGATIVE 02/09/2020 [place] 02/09/2020 1153   BILIRUBINUR Negative 12/30/2012 1018   KETONESUR NEGATIVE 02/09/2020 1153   PROTEINUR NEGATIVE 02/09/2020 1153   NITRITE NEGATIVE 02/09/2020 1153   LEUKOCYTESUR MODERATE (A) 02/09/2020 1153   LEUKOCYTESUR Negative 12/30/2012 1018   Sepsis Labs Invalid  input(s): PROCALCITONIN,  WBC,  LACTICIDVEN Microbiology Recent Results (from the past 240 hour(s))  SARS Coronavirus 2 by RT PCR (hospital order, performed in [place] hospital lab) Nasopharyngeal Nasopharyngeal Swab     Status: None   Collection Time: 02/09/20  3:45 AM   Specimen: Nasopharyngeal Swab  Result Value Ref Range Status   SARS Coronavirus 2 NEGATIVE NEGATIVE Final    Comment: (NOTE) SARS-CoV-2 target nucleic acids are NOT DETECTED.  The SARS-CoV-2 RNA is generally detectable in upper and lower respiratory specimens during the acute phase of infection. The lowest concentration of SARS-CoV-2 viral copies this assay can detect is 250 copies / mL. A negative result does not preclude SARS-CoV-2 infection and should not be used as the sole basis for treatment or other patient management decisions.  A negative result may occur with improper specimen collection / handling, submission of specimen other than nasopharyngeal swab, presence of viral mutation(s) within the areas targeted by this assay, and inadequate number of viral copies (<250 copies / mL). A negative result must be combined with clinical observations, patient history, and epidemiological information.  Fact Sheet for Patients:   StrictlyIdeas.no  Fact Sheet for Healthcare Providers: BankingDealers.co.za  This test is not yet approved or  cleared by the [place] FDA and has been authorized for detection and/or diagnosis of SARS-CoV-2 by FDA under an Emergency Use Authorization (EUA).  This EUA will remain in effect (meaning this test can be used) for the duration of the COVID-19 declaration under Section 564(b)(1) of the Act, 21 U.S.C. section 360bbb-3(b)(1), unless the authorization is terminated or revoked sooner.  Performed at [facility], [address] [place], [place] [address]   Blood culture (routine x 2)     Status: Abnormal    Collection Time: 02/09/20  3:55 AM   Specimen: BLOOD  Result Value Ref Range Status   Specimen Description   Final    BLOOD RIGHT ARM Performed at [facility], [address], [place], [place] [address]    Special Requests   Final    BOTTLES DRAWN AEROBIC AND ANAEROBIC Blood Culture results may not be optimal due to an inadequate volume of blood received in culture bottles Performed at [facility], [address], [place], [place] [address]    Culture  Setup Time   Final    GRAM NEGATIVE RODS ANAEROBIC BOTTLE ONLY Organism ID to follow CRITICAL RESULT CALLED TO, READ BACK BY AND VERIFIED WITHVioleta Gelinas @ 0057 02/10/20 RH Performed at [facility], [address] [place], [place] [address]    Culture SERRATIA MARCESCENS (A)  Final   Report Status 02/14/2020 FINAL  Final   Organism ID, Bacteria SERRATIA MARCESCENS  Final      Susceptibility   Serratia marcescens - MIC*    CEFAZOLIN >=64 RESISTANT Resistant     CEFEPIME <=0.12 SENSITIVE Sensitive     CEFTAZIDIME <=1 SENSITIVE Sensitive     CEFTRIAXONE <=0.25 SENSITIVE Sensitive     CIPROFLOXACIN <=0.25 SENSITIVE  Sensitive     GENTAMICIN <=1 SENSITIVE Sensitive     TRIMETH/SULFA <=20 SENSITIVE Sensitive     * SERRATIA MARCESCENS  Wound or Superficial Culture     Status: None   Collection Time: 02/09/20  3:55 AM   Specimen: Arm; Wound  Result Value Ref Range Status   Specimen Description   Final    ARM LEFT FOREARM Performed at [facility], [address], [place], [place] [address]    Special Requests   Final    NONE Performed at [facility], [address] [place], [place] [address]    Gram Stain   Final    FEW WBC PRESENT, PREDOMINANTLY PMN RARE GRAM POSITIVE RODS RARE GRAM POSITIVE COCCI Performed at [facility], [address], [place], [place] [address]    Culture FEW SERRATIA MARCESCENS  Final   Report Status 02/11/2020 FINAL  Final   Organism ID,  Bacteria SERRATIA MARCESCENS  Final      Susceptibility   Serratia marcescens - MIC*    CEFAZOLIN >=64 RESISTANT Resistant     CEFEPIME <=0.12 SENSITIVE Sensitive     CEFTAZIDIME <=1 SENSITIVE Sensitive     CEFTRIAXONE <=0.25 SENSITIVE Sensitive     CIPROFLOXACIN <=0.25 SENSITIVE Sensitive     GENTAMICIN <=1 SENSITIVE Sensitive     TRIMETH/SULFA <=20 SENSITIVE Sensitive     * FEW SERRATIA MARCESCENS  Blood Culture ID Panel (Reflexed)     Status: Abnormal   Collection Time: 02/09/20  3:55 AM  Result Value Ref Range Status   Enterococcus faecalis NOT DETECTED NOT DETECTED Final   Enterococcus Faecium NOT DETECTED NOT DETECTED Final   Listeria monocytogenes NOT DETECTED NOT DETECTED Final   Staphylococcus species NOT DETECTED NOT DETECTED Final   Staphylococcus aureus (BCID) NOT DETECTED NOT DETECTED Final   Staphylococcus epidermidis NOT DETECTED NOT DETECTED Final   Staphylococcus lugdunensis NOT DETECTED NOT DETECTED Final   Streptococcus species NOT DETECTED NOT DETECTED Final   Streptococcus agalactiae NOT DETECTED NOT DETECTED Final   Streptococcus pneumoniae NOT DETECTED NOT DETECTED Final   Streptococcus pyogenes NOT DETECTED NOT DETECTED Final   A.calcoaceticus-baumannii NOT DETECTED NOT DETECTED Final   Bacteroides fragilis NOT DETECTED NOT DETECTED Final   Enterobacterales DETECTED (A) NOT DETECTED Final    Comment: Enterobacterales represent a large order of gram negative bacteria, not a single organism. CRITICAL RESULT CALLED TO, READ BACK BY AND VERIFIED WITH: JASON ROBBINS @0057  02/10/20 RH    Enterobacter cloacae complex NOT DETECTED NOT DETECTED Final   Escherichia coli NOT DETECTED NOT DETECTED Final   Klebsiella aerogenes NOT DETECTED NOT DETECTED Final   Klebsiella oxytoca NOT DETECTED NOT DETECTED Final   Klebsiella pneumoniae NOT DETECTED NOT DETECTED Final   Proteus species NOT DETECTED NOT DETECTED Final   Salmonella species NOT DETECTED NOT DETECTED Final    Serratia marcescens DETECTED (A) NOT DETECTED Final    Comment: CRITICAL RESULT CALLED TO, READ BACK BY AND VERIFIED WITH: JASON ROBBINS @ [phone] 02/10/2020 RH    Haemophilus influenzae NOT DETECTED NOT DETECTED Final   Neisseria meningitidis NOT DETECTED NOT DETECTED Final   Pseudomonas aeruginosa NOT DETECTED NOT DETECTED Final   Stenotrophomonas maltophilia NOT DETECTED NOT DETECTED Final   Candida albicans NOT DETECTED NOT DETECTED Final   Candida auris NOT DETECTED NOT DETECTED Final   Candida glabrata NOT DETECTED NOT DETECTED Final   Candida krusei NOT DETECTED NOT DETECTED Final   Candida parapsilosis NOT DETECTED NOT  DETECTED Final   Candida tropicalis NOT DETECTED NOT DETECTED Final   Cryptococcus neoformans/gattii NOT DETECTED NOT DETECTED Final   CTX-M ESBL NOT DETECTED NOT DETECTED Final   Carbapenem resistance IMP NOT DETECTED NOT DETECTED Final   Carbapenem resistance KPC NOT DETECTED NOT DETECTED Final   Carbapenem resistance NDM NOT DETECTED NOT DETECTED Final   Carbapenem resist OXA 48 LIKE NOT DETECTED NOT DETECTED Final   Carbapenem resistance VIM NOT DETECTED NOT DETECTED Final    Comment: Performed at [facility], [address] [place], [place] [address]  Aerobic/Anaerobic Culture (surgical/deep wound)     Status: None   Collection Time: 02/09/20 10:48 AM   Specimen: Abscess  Result Value Ref Range Status   Specimen Description ABSCESS LEFT ARM  Final   Special Requests NONE  Final   Gram Stain   Final    MODERATE WBC PRESENT,BOTH PMN AND MONONUCLEAR MODERATE GRAM POSITIVE RODS RARE GRAM NEGATIVE RODS RARE YEAST    Culture   Final    FEW SERRATIA MARCESCENS FEW CANDIDA ALBICANS NO ANAEROBES ISOLATED Performed at [facility], [address], [place], [place] [address]    Report Status 02/14/2020 FINAL  Final   Organism ID, Bacteria SERRATIA MARCESCENS  Final      Susceptibility   Serratia marcescens - MIC*    CEFAZOLIN >=64 RESISTANT  Resistant     CEFEPIME <=0.12 SENSITIVE Sensitive     CEFTAZIDIME <=1 SENSITIVE Sensitive     CEFTRIAXONE <=0.25 SENSITIVE Sensitive     CIPROFLOXACIN <=0.25 SENSITIVE Sensitive     GENTAMICIN <=1 SENSITIVE Sensitive     TRIMETH/SULFA <=20 SENSITIVE Sensitive     * FEW SERRATIA MARCESCENS  CULTURE, BLOOD (ROUTINE X 2) w Reflex to ID Panel     Status: None (Preliminary result)   Collection Time: 02/11/20  1:17 PM   Specimen: BLOOD  Result Value Ref Range Status   Specimen Description BLOOD BLOOD LEFT HAND  Final   Special Requests   Final    BOTTLES DRAWN AEROBIC AND ANAEROBIC Blood Culture adequate volume   Culture   Final    NO GROWTH 3 DAYS Performed at [facility], [address], [place], [place] [address]    Report Status PENDING  Incomplete   Time coordinating discharge: 35 minutes  SIGNED:  Kerney Elbe, DO [facility] 02/14/2020, 6:54 PM Pager is on AMION  If 7PM-7AM, please contact night-coverage [email or web address]

## 2020-02-14 NOTE — Discharge Instructions (Signed)
Diabetes Basics  Diabetes (diabetes mellitus) is a long-term (chronic) disease. It occurs when the body does not properly use sugar (glucose) that is released from food after you eat. Diabetes may be caused by one or both of these problems:  Your pancreas does not make enough of a hormone called insulin.  Your body does not react in a normal way to insulin that it makes. Insulin lets sugars (glucose) go into cells in your body. This gives you energy. If you have diabetes, sugars cannot get into cells. This causes high blood sugar (hyperglycemia). Follow these instructions at home: How is diabetes treated? You may need to take insulin or other diabetes medicines daily to keep your blood sugar in balance. Take your diabetes medicines every day as told by your doctor. List your diabetes medicines here: Diabetes medicines  Name of medicine: ______________________________ ? Amount (dose): _______________ Time (a.m./p.m.): _______________ Notes: ___________________________________  Name of medicine: ______________________________ ? Amount (dose): _______________ Time (a.m./p.m.): _______________ Notes: ___________________________________  Name of medicine: ______________________________ ? Amount (dose): _______________ Time (a.m./p.m.): _______________ Notes: ___________________________________ If you use insulin, you will learn how to give yourself insulin by injection. You may need to adjust the amount based on the food that you eat. List the types of insulin you use here: Insulin  Insulin type: ______________________________ ? Amount (dose): _______________ Time (a.m./p.m.): _______________ Notes: ___________________________________  Insulin type: ______________________________ ? Amount (dose): _______________ Time (a.m./p.m.): _______________ Notes: ___________________________________  Insulin type: ______________________________ ? Amount (dose): _______________ Time (a.m./p.m.):  _______________ Notes: ___________________________________  Insulin type: ______________________________ ? Amount (dose): _______________ Time (a.m./p.m.): _______________ Notes: ___________________________________  Insulin type: ______________________________ ? Amount (dose): _______________ Time (a.m./p.m.): _______________ Notes: ___________________________________ How do I manage my blood sugar?  Check your blood sugar levels using a blood glucose monitor as directed by your doctor. Your doctor will set treatment goals for you. Generally, you should have these blood sugar levels:  Before meals (preprandial): 80-130 mg/dL (4.4-7.2 mmol/L).  After meals (postprandial): below 180 mg/dL (10 mmol/L).  A1c level: less than 7%. Write down the times that you will check your blood sugar levels: Blood sugar checks  Time: _______________ Notes: ___________________________________  Time: _______________ Notes: ___________________________________  Time: _______________ Notes: ___________________________________  Time: _______________ Notes: ___________________________________  Time: _______________ Notes: ___________________________________  Time: _______________ Notes: ___________________________________  What do I need to know about low blood sugar? Low blood sugar is called hypoglycemia. This is when blood sugar is at or below 70 mg/dL (3.9 mmol/L). Symptoms may include:  Feeling: ? Hungry. ? Worried or nervous (anxious). ? Sweaty and clammy. ? Confused. ? Dizzy. ? Sleepy. ? Sick to your stomach (nauseous).  Having: ? A fast heartbeat. ? A headache. ? A change in your vision. ? Tingling or no feeling (numbness) around the mouth, lips, or tongue. ? Jerky movements that you cannot control (seizure).  Having trouble with: ? Moving (coordination). ? Sleeping. ? Passing out (fainting). ? Getting upset easily (irritability). Treating low blood sugar To treat low blood  sugar, eat or drink something sugary right away. If you can think clearly and swallow safely, follow the 15:15 rule:  Take 15 grams of a fast-acting carb (carbohydrate). Talk with your doctor about how much you should take.  Some fast-acting carbs are: ? Sugar tablets (glucose pills). Take 3-4 glucose pills. ? 6-8 pieces of hard candy. ? 4-6 oz (120-150 mL) of fruit juice. ? 4-6 oz (120-150 mL) of regular (not diet) soda. ? 1 Tbsp (15 mL) honey or sugar.    Check your blood sugar 15 minutes after you take the carb.  If your blood sugar is still at or below 70 mg/dL (3.9 mmol/L), take 15 grams of a carb again.  If your blood sugar does not go above 70 mg/dL (3.9 mmol/L) after 3 tries, get help right away.  After your blood sugar goes back to normal, eat a meal or a snack within 1 hour. Treating very low blood sugar If your blood sugar is at or below 54 mg/dL (3 mmol/L), you have very low blood sugar (severe hypoglycemia). This is an emergency. Do not wait to see if the symptoms will go away. Get medical help right away. Call your local emergency services (911 in the [place] Do not drive yourself to the hospital. Questions to ask your health care provider  Do I need to meet with a diabetes educator?  What equipment will I need to care for myself at home?  What diabetes medicines do I need? When should I take them?  How often do I need to check my blood sugar?  What number can I call if I have questions?  When is my next doctor's visit?  Where can I find a support group for people with diabetes? Where to find more information  American Diabetes Association: www.diabetes.org  American Association of Diabetes Educators: www.diabeteseducator.org/patient-resources Contact a doctor if:  Your blood sugar is at or above 240 mg/dL (15.4 mmol/L) for 2 days in a row.  You have been sick or have had a fever for 2 days or more, and you are not getting better.  You have any of these  problems for more than 6 hours: ? You cannot eat or drink. ? You feel sick to your stomach (nauseous). ? You throw up (vomit). ? You have watery poop (diarrhea). Get help right away if:  Your blood sugar is lower than 54 mg/dL (3 mmol/L).  You get confused.  You have trouble: ? Thinking clearly. ? Breathing. Summary  Diabetes (diabetes mellitus) is a long-term (chronic) disease. It occurs when the body does not properly use sugar (glucose) that is released from food after digestion.  Take insulin and diabetes medicines as told.  Check your blood sugar every day, as often as told.  Keep all follow-up visits as told by your doctor. This is important. This information is not intended to replace advice given to you by your health care provider. Make sure you discuss any questions you have with your health care provider. Document Revised: 01/29/2019 Document Reviewed: 08/10/2017 Elsevier Patient Education  2020 Elsevier Inc.   Diabetes Mellitus and Exercise Exercising regularly is important for your overall health, especially when you have diabetes (diabetes mellitus). Exercising is not only about losing weight. It has many other health benefits, such as increasing muscle strength and bone density and reducing body fat and stress. This leads to improved fitness, flexibility, and endurance, all of which result in better overall health. Exercise has additional benefits for people with diabetes, including:  Reducing appetite.  Helping to lower and control blood glucose.  Lowering blood pressure.  Helping to control amounts of fatty substances (lipids) in the blood, such as cholesterol and triglycerides.  Helping the body to respond better to insulin (improving insulin sensitivity).  Reducing how much insulin the body needs.  Decreasing the risk for heart disease by: ? Lowering cholesterol and triglyceride levels. ? Increasing the levels of good cholesterol. ? Lowering blood  glucose levels. What is my activity plan? Your health  care provider or certified diabetes educator can help you make a plan for the type and frequency of exercise (activity plan) that works for you. Make sure that you:  Do at least 150 minutes of moderate-intensity or vigorous-intensity exercise each week. This could be brisk walking, biking, or water aerobics. ? Do stretching and strength exercises, such as yoga or weightlifting, at least 2 times a week. ? Spread out your activity over at least 3 days of the week.  Get some form of physical activity every day. ? Do not go more than 2 days in a row without some kind of physical activity. ? Avoid being inactive for more than 30 minutes at a time. Take frequent breaks to walk or stretch.  Choose a type of exercise or activity that you enjoy, and set realistic goals.  Start slowly, and gradually increase the intensity of your exercise over time. What do I need to know about managing my diabetes?   Check your blood glucose before and after exercising. ? If your blood glucose is 240 mg/dL (25.8 mmol/L) or higher before you exercise, check your urine for ketones. If you have ketones in your urine, do not exercise until your blood glucose returns to normal. ? If your blood glucose is 100 mg/dL (5.6 mmol/L) or lower, eat a snack containing 15-20 grams of carbohydrate. Check your blood glucose 15 minutes after the snack to make sure that your level is above 100 mg/dL (5.6 mmol/L) before you start your exercise.  Know the symptoms of low blood glucose (hypoglycemia) and how to treat it. Your risk for hypoglycemia increases during and after exercise. Common symptoms of hypoglycemia can include: ? Hunger. ? Anxiety. ? Sweating and feeling clammy. ? Confusion. ? Dizziness or feeling light-headed. ? Increased heart rate or palpitations. ? Blurry vision. ? Tingling or numbness around the mouth, lips, or tongue. ? Tremors or  shakes. ? Irritability.  Keep a rapid-acting carbohydrate snack available before, during, and after exercise to help prevent or treat hypoglycemia.  Avoid injecting insulin into areas of the body that are going to be exercised. For example, avoid injecting insulin into: ? The arms, when playing tennis. ? The legs, when jogging.  Keep records of your exercise habits. Doing this can help you and your health care provider adjust your diabetes management plan as needed. Write down: ? Food that you eat before and after you exercise. ? Blood glucose levels before and after you exercise. ? The type and amount of exercise you have done. ? When your insulin is expected to peak, if you use insulin. Avoid exercising at times when your insulin is peaking.  When you start a new exercise or activity, work with your health care provider to make sure the activity is safe for you, and to adjust your insulin, medicines, or food intake as needed.  Drink plenty of water while you exercise to prevent dehydration or heat stroke. Drink enough fluid to keep your urine clear or pale yellow. Summary  Exercising regularly is important for your overall health, especially when you have diabetes (diabetes mellitus).  Exercising has many health benefits, such as increasing muscle strength and bone density and reducing body fat and stress.  Your health care provider or certified diabetes educator can help you make a plan for the type and frequency of exercise (activity plan) that works for you.  When you start a new exercise or activity, work with your health care provider to make sure the activity is  safe for you, and to adjust your insulin, medicines, or food intake as needed. This information is not intended to replace advice given to you by your health care provider. Make sure you discuss any questions you have with your health care provider. Document Revised: 11/30/2016 Document Reviewed: 10/18/2015 Elsevier  Patient Education  2020 [employer].

## 2020-02-14 NOTE — Progress Notes (Signed)
Patient refusing ultrasound of abdomen, states he wants to eat lunch and cant go with out eating. Dr Marland Mcalpine notified. Patient agreeable to have ultrasound as outpatient. MD aware.

## 2020-02-14 NOTE — Progress Notes (Signed)
Inpatient Diabetes Program Recommendations  AACE/ADA: New Consensus Statement on Inpatient Glycemic Control (2015)  Target Ranges:  Prepandial:   less than 140 mg/dL      Peak postprandial:   less than 180 mg/dL (1-2 hours)      Critically ill patients:  140 - 180 mg/dL   Lab Results  Component Value Date   GLUCAP 284 (H) 02/14/2020   HGBA1C >15.5 (H) 02/09/2020    Review of Glycemic Control  CBGs 172, 284 mg/dL  Spoke with MD regarding pt going home on Humalog 75/25 35 units bid  Inpatient Diabetes Program Recommendations:     OP Diabetes Education consult ordered for HgbA1C > 15.5%.  Thank you. Ailene Ards, RD, LDN, CDE Inpatient Diabetes Coordinator [phone]

## 2020-02-14 NOTE — TOC Progression Note (Signed)
Transition of Care [facility]) - Progression Note    Patient Details  Name: Earl Moreno MRN: [id number] Date of Birth: 08/17/1965  [facility] CM/SW Contact  Lockie Pares, RN Phone Number: 02/14/2020, 5:56 PM  Clinical Narrative:    Sherron Monday to pharmacist Zella Ball at [facility] all went through except insulin called Dr Marland Mcalpine, changed to 70/30, but she will counsel patient that it is only for 2 days, that he needs to pick up 75/25 at medication management first thing Monday. She will make sure Med management knows as well.    Expected Discharge Plan: Home/Self Care Barriers to Discharge: Continued Medical Work up  Expected Discharge Plan and Services Expected Discharge Plan: Home/Self Care       Living arrangements for the past 2 months: Single Family Home Expected Discharge Date: 02/14/20                                     Social Determinants of Health (SDOH) Interventions    Readmission Risk Interventions Readmission Risk Prevention Plan 06/07/2018  Post Dischage Appt Complete  Medication Screening Complete  Transportation Screening Complete  PCP follow-up Complete  Some recent data might be hidden

## 2020-02-16 LAB — CULTURE, BLOOD (ROUTINE X 2)
Culture: NO GROWTH
Special Requests: ADEQUATE

## 2020-10-19 ENCOUNTER — Inpatient Hospital Stay
Admission: EM | Admit: 2020-10-19 | Discharge: 2020-10-20 | DRG: 637 | Disposition: A | Discharge status: Home / Self Care | Payer: Self-pay | Attending: Internal Medicine | Admitting: Internal Medicine

## 2020-10-19 ENCOUNTER — Other Ambulatory Visit: Payer: Self-pay

## 2020-10-19 DIAGNOSIS — F17200 Nicotine dependence, unspecified, uncomplicated: Secondary | ICD-10-CM | POA: Diagnosis present

## 2020-10-19 DIAGNOSIS — E1165 Type 2 diabetes mellitus with hyperglycemia: Principal | ICD-10-CM | POA: Diagnosis present

## 2020-10-19 DIAGNOSIS — E1101 Type 2 diabetes mellitus with hyperosmolarity with coma: Secondary | ICD-10-CM

## 2020-10-19 DIAGNOSIS — F1920 Other psychoactive substance dependence, uncomplicated: Secondary | ICD-10-CM | POA: Diagnosis present

## 2020-10-19 DIAGNOSIS — E871 Hypo-osmolality and hyponatremia: Secondary | ICD-10-CM | POA: Diagnosis present

## 2020-10-19 DIAGNOSIS — F1120 Opioid dependence, uncomplicated: Secondary | ICD-10-CM | POA: Diagnosis present

## 2020-10-19 DIAGNOSIS — Z79899 Other long term (current) drug therapy: Secondary | ICD-10-CM

## 2020-10-19 DIAGNOSIS — M79631 Pain in right forearm: Secondary | ICD-10-CM | POA: Diagnosis present

## 2020-10-19 DIAGNOSIS — I10 Essential (primary) hypertension: Secondary | ICD-10-CM | POA: Diagnosis present

## 2020-10-19 DIAGNOSIS — L03113 Cellulitis of right upper limb: Secondary | ICD-10-CM

## 2020-10-19 DIAGNOSIS — L03114 Cellulitis of left upper limb: Secondary | ICD-10-CM | POA: Diagnosis present

## 2020-10-19 DIAGNOSIS — Z833 Family history of diabetes mellitus: Secondary | ICD-10-CM

## 2020-10-19 DIAGNOSIS — T383X6A Underdosing of insulin and oral hypoglycemic [antidiabetic] drugs, initial encounter: Secondary | ICD-10-CM | POA: Diagnosis present

## 2020-10-19 DIAGNOSIS — Z20822 Contact with and (suspected) exposure to covid-19: Secondary | ICD-10-CM | POA: Diagnosis present

## 2020-10-19 DIAGNOSIS — F1420 Cocaine dependence, uncomplicated: Secondary | ICD-10-CM | POA: Diagnosis present

## 2020-10-19 DIAGNOSIS — L03012 Cellulitis of left finger: Secondary | ICD-10-CM

## 2020-10-19 DIAGNOSIS — Z9119 Patient's noncompliance with other medical treatment and regimen: Secondary | ICD-10-CM

## 2020-10-19 HISTORY — DX: Type 2 diabetes mellitus without complications: E11.9

## 2020-10-19 LAB — CBG MONITORING, ED
Glucose-Capillary: 204 mg/dL — ABNORMAL HIGH (ref 70–99)
Glucose-Capillary: 251 mg/dL — ABNORMAL HIGH (ref 70–99)
Glucose-Capillary: 260 mg/dL — ABNORMAL HIGH (ref 70–99)
Glucose-Capillary: 281 mg/dL — ABNORMAL HIGH (ref 70–99)
Glucose-Capillary: 289 mg/dL — ABNORMAL HIGH (ref 70–99)
Glucose-Capillary: 324 mg/dL — ABNORMAL HIGH (ref 70–99)
Glucose-Capillary: 337 mg/dL — ABNORMAL HIGH (ref 70–99)
Glucose-Capillary: 390 mg/dL — ABNORMAL HIGH (ref 70–99)
Glucose-Capillary: 404 mg/dL — ABNORMAL HIGH (ref 70–99)
Glucose-Capillary: 447 mg/dL — ABNORMAL HIGH (ref 70–99)
Glucose-Capillary: 482 mg/dL — ABNORMAL HIGH (ref 70–99)
Glucose-Capillary: 597 mg/dL (ref 70–99)
Glucose-Capillary: >600 mg/dL (ref 70–99)

## 2020-10-19 LAB — CBC WITH DIFFERENTIAL/PLATELET
Abs Immature Granulocytes: 0.05 10*3/uL (ref 0.00–0.07)
Abs Immature Granulocytes: 0.04 10*3/uL (ref 0.00–0.07)
Basophils Absolute: 0.1 10*3/uL (ref 0.0–0.1)
Basophils Absolute: 0.1 10*3/uL (ref 0.0–0.1)
Basophils Relative: 1 %
Basophils Relative: 1 %
Eosinophils Absolute: 0.1 10*3/uL (ref 0.0–0.5)
Eosinophils Absolute: 0.1 10*3/uL (ref 0.0–0.5)
Eosinophils Relative: 1 %
Eosinophils Relative: 1 %
HCT: 41.8 % (ref 39.0–52.0)
HCT: 39 % (ref 39.0–52.0)
Hemoglobin: 14.3 g/dL (ref 13.0–17.0)
Hemoglobin: 13.7 g/dL (ref 13.0–17.0)
Immature Granulocytes: 1 %
Immature Granulocytes: 1 %
Lymphocytes Relative: 19 %
Lymphocytes Relative: 20 %
Lymphs Abs: 1.4 10*3/uL (ref 0.7–4.0)
Lymphs Abs: 1.4 10*3/uL (ref 0.7–4.0)
MCH: 30 pg (ref 26.0–34.0)
MCH: 29.9 pg (ref 26.0–34.0)
MCHC: 34.2 g/dL (ref 30.0–36.0)
MCHC: 35.1 g/dL (ref 30.0–36.0)
MCV: 87.8 fL (ref 80.0–100.0)
MCV: 85.2 fL (ref 80.0–100.0)
Monocytes Absolute: 0.5 10*3/uL (ref 0.1–1.0)
Monocytes Absolute: 0.4 10*3/uL (ref 0.1–1.0)
Monocytes Relative: 6 %
Monocytes Relative: 6 %
Neutro Abs: 5.2 10*3/uL (ref 1.7–7.7)
Neutro Abs: 4.9 10*3/uL (ref 1.7–7.7)
Neutrophils Relative %: 72 %
Neutrophils Relative %: 71 %
Platelets: 199 10*3/uL (ref 150–400)
Platelets: 186 10*3/uL (ref 150–400)
RBC: 4.76 MIL/uL (ref 4.22–5.81)
RBC: 4.58 MIL/uL (ref 4.22–5.81)
RDW: 12.6 % (ref 11.5–15.5)
RDW: 12.3 % (ref 11.5–15.5)
WBC: 7.2 10*3/uL (ref 4.0–10.5)
WBC: 6.9 10*3/uL (ref 4.0–10.5)
nRBC: 0 % (ref 0.0–0.2)
nRBC: 0 % (ref 0.0–0.2)

## 2020-10-19 LAB — COMPREHENSIVE METABOLIC PANEL
ALT: 12 U/L (ref 0–44)
AST: 17 U/L (ref 15–41)
Albumin: 3.8 g/dL (ref 3.5–5.0)
Alkaline Phosphatase: 122 U/L (ref 38–126)
Anion gap: 12 (ref 5–15)
BUN: 10 mg/dL (ref 6–20)
CO2: 27 mmol/L (ref 22–32)
Calcium: 8.8 mg/dL — ABNORMAL LOW (ref 8.9–10.3)
Chloride: 85 mmol/L — ABNORMAL LOW (ref 98–111)
Creatinine, Ser: 0.92 mg/dL (ref 0.61–1.24)
GFR, Estimated: >60 mL/min (ref >60)
Glucose, Bld: 1061 mg/dL (ref 70–99)
Potassium: 4.3 mmol/L (ref 3.5–5.1)
Sodium: 124 mmol/L — ABNORMAL LOW (ref 135–145)
Total Bilirubin: 0.8 mg/dL (ref 0.3–1.2)
Total Protein: 8.1 g/dL (ref 6.5–8.1)

## 2020-10-19 LAB — URINALYSIS, ROUTINE W REFLEX MICROSCOPIC
Bacteria, UA: NONE SEEN
Bilirubin Urine: NEGATIVE
Glucose, UA: >=500 mg/dL — AB
Hgb urine dipstick: NEGATIVE
Ketones, ur: NEGATIVE mg/dL
Leukocytes,Ua: NEGATIVE
Nitrite: NEGATIVE
Protein, ur: NEGATIVE mg/dL
Specific Gravity, Urine: 1.03 (ref 1.005–1.030)
Squamous Epithelial / HPF: NONE SEEN (ref 0–5)
pH: 6 (ref 5.0–8.0)

## 2020-10-19 LAB — BLOOD GAS, VENOUS
Acid-Base Excess: 4.6 mmol/L — ABNORMAL HIGH (ref 0.0–2.0)
Bicarbonate: 32 mmol/L — ABNORMAL HIGH (ref 20.0–28.0)
O2 Saturation: 87.1 %
Patient temperature: 37
pCO2, Ven: 58 mmHg (ref 44.0–60.0)
pH, Ven: 7.35 (ref 7.250–7.430)
pO2, Ven: 56 mmHg — ABNORMAL HIGH (ref 32.0–45.0)

## 2020-10-19 LAB — LACTIC ACID, PLASMA
Lactic Acid, Venous: 1.6 mmol/L (ref 0.5–1.9)
Lactic Acid, Venous: 0.8 mmol/L (ref 0.5–1.9)

## 2020-10-19 LAB — BASIC METABOLIC PANEL
Anion gap: 10 (ref 5–15)
BUN: 8 mg/dL (ref 6–20)
CO2: 25 mmol/L (ref 22–32)
Calcium: 8.4 mg/dL — ABNORMAL LOW (ref 8.9–10.3)
Chloride: 91 mmol/L — ABNORMAL LOW (ref 98–111)
Creatinine, Ser: 0.72 mg/dL (ref 0.61–1.24)
GFR, Estimated: >60 mL/min (ref >60)
Glucose, Bld: 724 mg/dL (ref 70–99)
Potassium: 4.1 mmol/L (ref 3.5–5.1)
Sodium: 126 mmol/L — ABNORMAL LOW (ref 135–145)

## 2020-10-19 LAB — OSMOLALITY: Osmolality: 291 mOsm/kg (ref 275–295)

## 2020-10-19 LAB — RESP PANEL BY RT-PCR (FLU A&B, COVID) ARPGX2
Influenza A by PCR: NEGATIVE
Influenza B by PCR: NEGATIVE
SARS Coronavirus 2 by RT PCR: NEGATIVE

## 2020-10-19 LAB — BETA-HYDROXYBUTYRIC ACID: Beta-Hydroxybutyric Acid: 0.27 mmol/L (ref 0.05–0.27)

## 2020-10-19 MED ORDER — HALOPERIDOL 0.5 MG PO TABS
0.5000 mg | ORAL_TABLET | Freq: Three times a day (TID) | ORAL | Status: DC
  Administered 2020-10-20: 0.5 mg via ORAL
  Filled 2020-10-19 (×5): qty 1

## 2020-10-19 MED ORDER — POTASSIUM CHLORIDE CRYS ER 20 MEQ PO TBCR: 40.0000 meq | EXTENDED_RELEASE_TABLET | Freq: Once | ORAL | Status: DC

## 2020-10-19 MED ORDER — DEXTROSE IN LACTATED RINGERS 5 % IV SOLN: INTRAVENOUS | Status: DC

## 2020-10-19 MED ORDER — LACTATED RINGERS IV SOLN: INTRAVENOUS | Status: DC

## 2020-10-19 MED ORDER — INSULIN REGULAR(HUMAN) IN NACL 100-0.9 UT/100ML-% IV SOLN
INTRAVENOUS | Status: DC
  Administered 2020-10-19: 5.5 [IU]/h via INTRAVENOUS
  Filled 2020-10-19: qty 100

## 2020-10-19 MED ORDER — VANCOMYCIN HCL 750 MG/150ML IV SOLN
750.0000 mg | Freq: Two times a day (BID) | INTRAVENOUS | Status: DC
  Administered 2020-10-20 (×2): 750 mg via INTRAVENOUS
  Filled 2020-10-19 (×4): qty 150

## 2020-10-19 MED ORDER — FENTANYL CITRATE (PF) 100 MCG/2ML IJ SOLN
75.0000 ug | Freq: Once | INTRAMUSCULAR | Status: AC
  Administered 2020-10-19: 75 ug via INTRAVENOUS
  Filled 2020-10-19: qty 2

## 2020-10-19 MED ORDER — DEXTROSE 50 % IV SOLN: 0.0000 mL | INTRAVENOUS | Status: DC | PRN

## 2020-10-19 MED ORDER — NICOTINE 14 MG/24HR TD PT24
14.0000 mg | MEDICATED_PATCH | Freq: Every day | TRANSDERMAL | Status: DC
  Administered 2020-10-19: 14 mg via TRANSDERMAL
  Filled 2020-10-19: qty 1

## 2020-10-19 MED ORDER — SODIUM CHLORIDE 0.9 % IV SOLN
2.0000 g | Freq: Once | INTRAVENOUS | Status: AC
  Administered 2020-10-19: 2 g via INTRAVENOUS
  Filled 2020-10-19: qty 20

## 2020-10-19 MED ORDER — INSULIN REGULAR(HUMAN) IN NACL 100-0.9 UT/100ML-% IV SOLN: INTRAVENOUS | Status: DC

## 2020-10-19 MED ORDER — ONDANSETRON HCL 4 MG/2ML IJ SOLN
4.0000 mg | Freq: Once | INTRAMUSCULAR | Status: AC
  Administered 2020-10-19: 4 mg via INTRAVENOUS
  Filled 2020-10-19: qty 2

## 2020-10-19 MED ORDER — AMLODIPINE BESYLATE 5 MG PO TABS
10.0000 mg | ORAL_TABLET | Freq: Every day | ORAL | Status: DC
  Administered 2020-10-20: 10 mg via ORAL
  Filled 2020-10-19 (×2): qty 2

## 2020-10-19 MED ORDER — POTASSIUM CHLORIDE CRYS ER 20 MEQ PO TBCR
20.0000 meq | EXTENDED_RELEASE_TABLET | Freq: Once | ORAL | Status: AC
  Administered 2020-10-19: 20 meq via ORAL
  Filled 2020-10-19: qty 1

## 2020-10-19 MED ORDER — RISAQUAD PO CAPS
2.0000 | ORAL_CAPSULE | Freq: Three times a day (TID) | ORAL | Status: DC
  Administered 2020-10-19 – 2020-10-20 (×3): 2 via ORAL
  Filled 2020-10-19 (×3): qty 2

## 2020-10-19 MED ORDER — ENOXAPARIN SODIUM 40 MG/0.4ML IJ SOSY
40.0000 mg | PREFILLED_SYRINGE | INTRAMUSCULAR | Status: DC
  Administered 2020-10-20: 40 mg via SUBCUTANEOUS
  Filled 2020-10-19 (×2): qty 0.4

## 2020-10-19 MED ORDER — TRIHEXYPHENIDYL HCL 2 MG PO TABS
1.0000 mg | ORAL_TABLET | Freq: Two times a day (BID) | ORAL | Status: DC
  Administered 2020-10-19 – 2020-10-20 (×2): 1 mg via ORAL
  Filled 2020-10-19 (×4): qty 1

## 2020-10-19 MED ORDER — LIDOCAINE-EPINEPHRINE 2 %-1:100000 IJ SOLN
20.0000 mL | Freq: Once | INTRAMUSCULAR | Status: AC
  Administered 2020-10-19: 20 mL via INTRADERMAL
  Filled 2020-10-19: qty 1

## 2020-10-19 MED ORDER — ADULT MULTIVITAMIN W/MINERALS CH
1.0000 | ORAL_TABLET | Freq: Every day | ORAL | Status: DC
  Administered 2020-10-19 – 2020-10-20 (×2): 1 via ORAL
  Filled 2020-10-19 (×2): qty 1

## 2020-10-19 MED ORDER — POTASSIUM CHLORIDE 10 MEQ/100ML IV SOLN
10.0000 meq | INTRAVENOUS | Status: AC
  Filled 2020-10-19: qty 100

## 2020-10-19 MED ORDER — SODIUM CHLORIDE 0.9 % IV BOLUS
1000.0000 mL | Freq: Once | INTRAVENOUS | Status: AC
  Administered 2020-10-19: 1000 mL via INTRAVENOUS

## 2020-10-19 MED ORDER — DULOXETINE HCL 20 MG PO CPEP
20.0000 mg | ORAL_CAPSULE | Freq: Every day | ORAL | Status: DC
  Administered 2020-10-19 – 2020-10-20 (×2): 20 mg via ORAL
  Filled 2020-10-19 (×2): qty 1

## 2020-10-19 MED ORDER — VANCOMYCIN HCL 1250 MG/250ML IV SOLN
1250.0000 mg | Freq: Once | INTRAVENOUS | Status: AC
  Administered 2020-10-19: 1250 mg via INTRAVENOUS
  Filled 2020-10-19: qty 250

## 2020-10-19 MED ORDER — DICYCLOMINE HCL 20 MG PO TABS
20.0000 mg | ORAL_TABLET | Freq: Four times a day (QID) | ORAL | Status: DC | PRN
  Administered 2020-10-19: 20 mg via ORAL
  Filled 2020-10-19: qty 1

## 2020-10-19 NOTE — Consult Note (Signed)
SURGICAL CONSULTATION NOTE   HISTORY OF PRESENT ILLNESS (HPI):  55 y.o. male presented to [facility] ED for evaluation of right forearm abscess. Patient reports she has been having pain in the right forearm.  Pain radiates to the distal right upper extremity.  Aggravating factor is applying pressure.  There has been no alleviating factors.  Patient reports he has had these abscesses before in multiple areas of face upper extremities.  He denies any fever or chills.  In the past patient had abscess of the forearm that was drained by Dr. Lady Gary.  At the ED he was found with hyperglycemia.  As per patient he had an abscess drained by a doctor at the emergency room.  He also mention that our nurse took care of the wound.  He was admitted by hospitalist for the management of hyperglycemia.  Surgery is consulted by Dr. Joylene Igo in this context for evaluation and management of right forearm abscess.  PAST MEDICAL HISTORY (PMH):  Past Medical History:  Diagnosis Date  . Diabetes mellitus without complication (HCC)      PAST SURGICAL HISTORY (PSH):  Past Surgical History:  Procedure Laterality Date  . I & D EXTREMITY Left 06/05/2018   Procedure: IRRIGATION AND DEBRIDEMENT EXTREMITY-LEFT HAND;  Surgeon: Lyndle Herrlich, MD;  Location: [facility] ORS;  Service: Orthopedics;  Laterality: Left;     MEDICATIONS:  Prior to Admission medications   Medication Sig Start Date End Date Taking? Authorizing Provider  acidophilus (RISAQUAD) CAPS capsule Take 2 capsules by mouth 3 (three) times daily. 02/14/20   Marguerita Merles Latif, DO  amLODipine (NORVASC) 10 MG tablet Take 1 tablet (10 mg total) by mouth daily. 02/14/20   Marguerita Merles Latif, DO  cloNIDine (CATAPRES) 0.1 MG tablet Take 0.5 tablets (0.05 mg total) by mouth 2 (two) times daily for 2 doses. 02/14/20 02/15/20  Marguerita Merles Latif, DO  dicyclomine (BENTYL) 20 MG tablet Take 1 tablet (20 mg total) by mouth every 6 (six) hours as needed for spasms (abdominal  cramping). 02/14/20   Marguerita Merles Latif, DO  DULoxetine (CYMBALTA) 20 MG capsule Take 1 capsule (20 mg total) by mouth daily. 02/14/20 03/15/20  Merlene Laughter, DO  Ensure Max Protein (ENSURE MAX PROTEIN) LIQD Take 330 mLs (11 oz total) by mouth 2 (two) times daily between meals. 02/14/20   Marguerita Merles Latif, DO  haloperidol (HALDOL) 0.5 MG tablet Take 1 tablet (0.5 mg total) by mouth 3 (three) times daily. 02/14/20   Marguerita Merles Latif, DO  hydrocortisone cream 1 % Apply topically 2 (two) times daily. 02/14/20   Marguerita Merles Latif, DO  insulin aspart protamine - aspart (NOVOLOG 70/30 MIX) (70-30) 100 UNIT/ML FlexPen Inject 0.35 mLs (35 Units total) into the skin 2 (two) times daily. 02/14/20   [name], Kateri Mc Latif, DO  Insulin Pen Needle 32G X 4 MM MISC 1 Container by Does not apply route in the morning and at bedtime. 02/14/20   Marguerita Merles Latif, DO  levofloxacin (LEVAQUIN) 750 MG tablet Take 1 tablet (750 mg total) by mouth daily. 02/14/20   Marguerita Merles Latif, DO  loperamide (IMODIUM) 2 MG capsule Take 1-2 capsules (2-4 mg total) by mouth as needed for diarrhea or loose stools (diarrhea). 02/14/20   Marguerita Merles Latif, DO  methocarbamol (ROBAXIN) 500 MG tablet Take 1 tablet (500 mg total) by mouth every 8 (eight) hours as needed for muscle spasms. 02/14/20   Marguerita Merles Latif, DO  Multiple Vitamin (MULTIVITAMIN WITH MINERALS) TABS tablet Take  1 tablet by mouth daily. 02/14/20   Marguerita Merles Latif, DO  nicotine polacrilex (NICORETTE) 2 MG gum Take 1 each (2 mg total) by mouth as needed for smoking cessation. 02/14/20   Marguerita Merles Latif, DO  ondansetron (ZOFRAN-ODT) 4 MG disintegrating tablet Take 1 tablet (4 mg total) by mouth every 6 (six) hours as needed for nausea or vomiting. 02/14/20   Marguerita Merles Latif, DO  trihexyphenidyl (ARTANE) 2 MG tablet Take 0.5 tablets (1 mg total) by mouth 2 (two) times daily with a meal. 02/14/20   Merlene Laughter, DO     ALLERGIES:  No Known  Allergies   SOCIAL HISTORY:  Social History   Socioeconomic History  . Marital status: Married    Spouse name: Not on file  . Number of children: Not on file  . Years of education: Not on file  . Highest education level: Not on file  Occupational History  . Not on file  Tobacco Use  . Smoking status: Current Every Day Smoker    Packs/day: 0.50  . Smokeless tobacco: Never Used  Substance and Sexual Activity  . Alcohol use: No  . Drug use: Yes    Types: Cocaine, IV    Comment: Heroin  . Sexual activity: Not on file    Comment: occasionally  Other Topics Concern  . Not on file  Social History Narrative  . Not on file   Social Determinants of Health   Financial Resource Strain: Not on file  Food Insecurity: Not on file  Transportation Needs: Not on file  Physical Activity: Not on file  Stress: Not on file  Social Connections: Not on file  Intimate Partner Violence: Not on file      FAMILY HISTORY:  Family History  Problem Relation Age of Onset  . Diabetes Mother      REVIEW OF SYSTEMS:  Constitutional: denies weight loss, fever, chills, or sweats  Eyes: denies any other vision changes, history of eye injury  ENT: denies sore throat, hearing problems  Respiratory: denies shortness of breath, wheezing  Cardiovascular: denies chest pain, palpitations  Gastrointestinal: abdominal pain, nausea and vomiting Genitourinary: denies burning with urination or urinary frequency Musculoskeletal: denies any other joint pains or cramps.  Positive for pain in the upper extremities Skin: denies any other rashes or skin discolorations  Neurological: denies any other headache, dizziness, weakness  Psychiatric: denies any other depression, anxiety   All other review of systems were negative   VITAL SIGNS:  Temp:  [98.2 F (36.8 C)] 98.2 F (36.8 C) (05/31 0847) Pulse Rate:  [74-98] 74 (05/31 1100) Resp:  [10-20] 10 (05/31 1100) BP: (127-166)/(88-92) 127/92 (05/31  1100) SpO2:  [94 %-96 %] 96 % (05/31 1100) Weight:  [59 kg] 59 kg (05/31 0848)     Height: 5\' 8"  (172.7 cm) Weight: 59 kg BMI (Calculated): 19.77   INTAKE/OUTPUT:  This shift: No intake/output data recorded.  Last 2 shifts: @IOLAST2SHIFTS @   PHYSICAL EXAM:  Constitutional:  -- Normal body habitus  -- Awake, alert, and oriented x3  Eyes:  -- Pupils equally round and reactive to light  -- No scleral icterus  Ear, nose, and throat:  -- No jugular venous distension  Pulmonary:  -- No crackles  -- Equal breath sounds bilaterally -- Breathing non-labored at rest Cardiovascular:  -- S1, S2 present  -- No pericardial rubs Gastrointestinal:  -- Abdomen soft, nontender, non-distended, no guarding or rebound tenderness -- No abdominal masses appreciated, pulsatile or otherwise  Musculoskeletal and Integumentary:  -- Wounds: Is an open wound of the right forearm.  There is no purulence.  There is no fluid collection -- Extremities: Bilateral upper extremities with multiple spots of cellulitis, localized, no fluid collections. Neurologic:  -- Motor function: intact and symmetric -- Sensation: intact and symmetric   Labs:  CBC Latest Ref Rng & Units 10/19/2020 10/19/2020 02/14/2020  WBC 4.0 - 10.5 K/uL 6.9 7.2 7.2  Hemoglobin 13.0 - 17.0 g/dL 43.1 54.0 08.6  Hematocrit 39.0 - 52.0 % 39.0 41.8 43.1  Platelets 150 - 400 K/uL 186 199 289   CMP Latest Ref Rng & Units 10/19/2020 10/19/2020 02/14/2020  Glucose 70 - 99 mg/dL 761(PJ) 0,932(IZ) 124(P)  BUN 6 - 20 mg/dL 8 10 14   Creatinine 0.61 - 1.24 mg/dL 8.09 9.83)  Sodium 135 - 145 mmol/L 126(L) 124(L) 139  Potassium 3.5 - 5.1 mmol/L 4.1 4.3 3.7  Chloride 98 - 111 mmol/L 91(L) 85(L) 106  CO2 22 - 32 mmol/L 25 27 26   Calcium 8.9 - 10.3 mg/dL 3.82(N) ) 9.3  Total Protein 6.5 - 8.1 g/dL - 8.1 7.4  Total Bilirubin 0.3 - 1.2 mg/dL - 0.8 0.4  Alkaline Phos 38 - 126 U/L - 122 89  AST 15 - 41 U/L - 17 82(H)  ALT 0 - 44 U/L - 12 51(H)     Imaging studies:  I personally evaluated the upper extremity x-rays.  There is no subcutaneous gas or any foreign objects.  Assessment/Plan:  55 y.o. male with cellulitis of the upper extremities, complicated by pertinent comorbidities including IV drug abuser.  Patient with multiple area of localized colitis.  I was consulted for a suspected right forearm abscess.  As per patient he has been draining spontaneously plus he was drained by a dog.  At the emergency room.  At the moment of violation there is no sign of abscess in the right forearm or in the other areas of cellulitis.  There is no leukocytosis or crepitus.  I agree with current IV antibiotic therapy.  No surgical management needed.  No other indication to continue treatment for his hypoglycemia as per hospitalist.  9.7(Q, MD

## 2020-10-19 NOTE — ED Provider Notes (Signed)
[facility] Emergency Department Provider Note  ____________________________________________   Event Date/Time   First MD Initiated Contact with Patient 10/19/20 [phone]     (approximate)  I have reviewed the triage vital signs and the nursing notes.   HISTORY  Chief Complaint Abscess    HPI Earl Moreno is a 55 y.o. male with history of diabetes, heroin addiction, here with pain of his right forearm, left thumb, polyuria, polydipsia.  The patient states that he has been essentially out of any of his medications for the last several weeks.  He states that he developed associated increasing thirst, urination, and fatigue.  He has been short of breath.  He has had associated increasing areas of pain and redness from his injection sites.  He has a history of prior forearm and hand abscesses related to this.  He states that over the last several days, he has had progressive, worsening, aching, throbbing, right forearm pain.  The pain is localized along the area of swelling which began draining spontaneously several days ago.  He has also had some pain and an area of redness along his left hand.  No alleviating factors.        Past Medical History:  Diagnosis Date  . Diabetes mellitus without complication [facility])     Patient Active Problem List   Diagnosis Date Noted  . Abscess of multiple sites   . Hyperosmolar hyperglycemic state (HHS) (HCC)   . Polysubstance abuse (HCC)   . Protein-calorie malnutrition, severe 02/11/2020  . New onset type 2 diabetes mellitus (HCC) 02/09/2020  . Hyperglycemia due to type 2 diabetes mellitus (HCC) 02/09/2020  . Hyperglycemia due to diabetes mellitus (HCC) 02/09/2020  . Abscess 02/09/2020  . Heroin addiction (HCC) 02/09/2020  . Hyponatremia 02/09/2020  . Essential hypertension 02/09/2020  . Cellulitis of left hand 06/02/2018    Past Surgical History:  Procedure Laterality Date  . I & D EXTREMITY Left 06/05/2018    Procedure: IRRIGATION AND DEBRIDEMENT EXTREMITY-LEFT HAND;  Surgeon: Lyndle Herrlich, MD;  Location: [facility] ORS;  Service: Orthopedics;  Laterality: Left;    Prior to Admission medications   Medication Sig Start Date End Date Taking? Authorizing Provider  acidophilus (RISAQUAD) CAPS capsule Take 2 capsules by mouth 3 (three) times daily. Patient not taking: Reported on 10/19/2020 02/14/20   Marguerita Merles Latif, DO  amLODipine (NORVASC) 10 MG tablet Take 1 tablet (10 mg total) by mouth daily. Patient not taking: Reported on 10/19/2020 02/14/20   Marguerita Merles Latif, DO  cloNIDine (CATAPRES) 0.1 MG tablet Take 0.5 tablets (0.05 mg total) by mouth 2 (two) times daily for 2 doses. 02/14/20 02/15/20  Marguerita Merles Latif, DO  dicyclomine (BENTYL) 20 MG tablet Take 1 tablet (20 mg total) by mouth every 6 (six) hours as needed for spasms (abdominal cramping). Patient not taking: Reported on 10/19/2020 02/14/20   Marguerita Merles Latif, DO  DULoxetine (CYMBALTA) 20 MG capsule Take 1 capsule (20 mg total) by mouth daily. 02/14/20 03/15/20  Merlene Laughter, DO  Ensure Max Protein (ENSURE MAX PROTEIN) LIQD Take 330 mLs (11 oz total) by mouth 2 (two) times daily between meals. 02/14/20   Marguerita Merles Latif, DO  haloperidol (HALDOL) 0.5 MG tablet Take 1 tablet (0.5 mg total) by mouth 3 (three) times daily. Patient not taking: Reported on 10/19/2020 02/14/20   Marguerita Merles Latif, DO  hydrocortisone cream 1 % Apply topically 2 (two) times daily. 02/14/20   Merlene Laughter, DO  insulin aspart protamine - aspart (NOVOLOG 70/30 MIX) (70-30) 100 UNIT/ML FlexPen Inject 0.35 mLs (35 Units total) into the skin 2 (two) times daily. Patient not taking: Reported on 10/19/2020 02/14/20   Marguerita Merles Latif, DO  Insulin Pen Needle 32G X 4 MM MISC 1 Container by Does not apply route in the morning and at bedtime. 02/14/20   Marguerita Merles Latif, DO  levofloxacin (LEVAQUIN) 750 MG tablet Take 1 tablet (750 mg total) by mouth  daily. Patient not taking: No sig reported 02/14/20   Marguerita Merles Latif, DO  loperamide (IMODIUM) 2 MG capsule Take 1-2 capsules (2-4 mg total) by mouth as needed for diarrhea or loose stools (diarrhea). Patient not taking: Reported on 10/19/2020 02/14/20   Marguerita Merles Latif, DO  methocarbamol (ROBAXIN) 500 MG tablet Take 1 tablet (500 mg total) by mouth every 8 (eight) hours as needed for muscle spasms. Patient not taking: No sig reported 02/14/20   Marguerita Merles Latif, DO  Multiple Vitamin (MULTIVITAMIN WITH MINERALS) TABS tablet Take 1 tablet by mouth daily. Patient not taking: Reported on 10/19/2020 02/14/20   Marguerita Merles Latif, DO  nicotine polacrilex (NICORETTE) 2 MG gum Take 1 each (2 mg total) by mouth as needed for smoking cessation. Patient not taking: Reported on 10/19/2020 02/14/20   Marguerita Merles Latif, DO  ondansetron (ZOFRAN-ODT) 4 MG disintegrating tablet Take 1 tablet (4 mg total) by mouth every 6 (six) hours as needed for nausea or vomiting. Patient not taking: No sig reported 02/14/20   Marguerita Merles Latif, DO  trihexyphenidyl (ARTANE) 2 MG tablet Take 0.5 tablets (1 mg total) by mouth 2 (two) times daily with a meal. Patient not taking: Reported on 10/19/2020 02/14/20   Merlene Laughter, DO    Allergies Patient has no known allergies.  Family History  Problem Relation Age of Onset  . Diabetes Mother     Social History Social History   Tobacco Use  . Smoking status: Current Every Day Smoker    Packs/day: 0.50  . Smokeless tobacco: Never Used  Substance Use Topics  . Alcohol use: No  . Drug use: Yes    Types: Cocaine, IV    Comment: Heroin    Review of Systems  Review of Systems  Constitutional: Positive for chills and fatigue. Negative for fever.  HENT: Negative for sore throat.   Respiratory: Negative for shortness of breath.   Cardiovascular: Negative for chest pain.  Gastrointestinal: Negative for abdominal pain.  Genitourinary: Negative for flank  pain.  Musculoskeletal: Negative for neck pain.  Skin: Positive for rash and wound.  Allergic/Immunologic: Negative for immunocompromised state.  Neurological: Positive for weakness. Negative for numbness.  Hematological: Does not bruise/bleed easily.  All other systems reviewed and are negative.    ____________________________________________  PHYSICAL EXAM:      VITAL SIGNS: ED Triage Vitals  Enc Vitals Group     BP 10/19/20 0847 (!) 166/88     Pulse Rate 10/19/20 0847 98     Resp 10/19/20 0847 17     Temp 10/19/20 0847 98.2 F (36.8 C)     Temp Source 10/19/20 0847 Oral     SpO2 10/19/20 0847 94 %     Weight 10/19/20 0848 130 lb (59 kg)     Height 10/19/20 0848 5\' 8"  (1.727 m)     Head Circumference --      Peak Flow --      Pain Score 10/19/20 0848 8     Pain  Loc --      Pain Edu? --      Excl. in GC? --      Physical Exam Vitals and nursing note reviewed.  Constitutional:      General: He is not in acute distress.    Appearance: He is well-developed.  HENT:     Head: Normocephalic and atraumatic.     Mouth/Throat:     Mouth: Mucous membranes are dry.  Eyes:     Conjunctiva/sclera: Conjunctivae normal.  Cardiovascular:     Rate and Rhythm: Regular rhythm. Tachycardia present.     Heart sounds: Normal heart sounds. No murmur heard. No friction rub.  Pulmonary:     Effort: Pulmonary effort is normal. No respiratory distress.     Breath sounds: Normal breath sounds. No wheezing or rales.  Abdominal:     General: There is no distension.     Palpations: Abdomen is soft.     Tenderness: There is no abdominal tenderness.  Musculoskeletal:     Cervical back: Neck supple.  Skin:    General: Skin is warm.     Capillary Refill: Capillary refill takes less than 2 seconds.     Comments: Approximately 2-1/2 x 1 cm area of fluctuance and induration along the right volar forearm.  Moderate tenderness to palpation.  Minimal fluctuance.  Area of erythema and induration  along the left thumb, without fluctuance.  No drainage.  Neurological:     Mental Status: He is alert and oriented to person, place, and time.     Motor: No abnormal muscle tone.       ____________________________________________   LABS (all labs ordered are listed, but only abnormal results are displayed)  Labs Reviewed  COMPREHENSIVE METABOLIC PANEL - Abnormal; Notable for the following components:      Result Value   Sodium 124 (*)    Chloride 85 (*)    Glucose, Bld 1,061 (*)    Calcium 8.8 (*)    All other components within normal limits  BLOOD GAS, VENOUS - Abnormal; Notable for the following components:   pO2, Ven 56.0 (*)    Bicarbonate 32.0 (*)    Acid-Base Excess 4.6 (*)    All other components within normal limits  URINALYSIS, ROUTINE W REFLEX MICROSCOPIC - Abnormal; Notable for the following components:   Color, Urine STRAW (*)    APPearance CLEAR (*)    Glucose, UA >=500 (*)    All other components within normal limits  BASIC METABOLIC PANEL - Abnormal; Notable for the following components:   Sodium 126 (*)    Chloride 91 (*)    Glucose, Bld 724 (*)    Calcium 8.4 (*)    All other components within normal limits  CBG MONITORING, ED - Abnormal; Notable for the following components:   Glucose-Capillary >600 (*)    All other components within normal limits  CBG MONITORING, ED - Abnormal; Notable for the following components:   Glucose-Capillary 597 (*)    All other components within normal limits  CBG MONITORING, ED - Abnormal; Notable for the following components:   Glucose-Capillary 447 (*)    All other components within normal limits  CBG MONITORING, ED - Abnormal; Notable for the following components:   Glucose-Capillary 390 (*)    All other components within normal limits  CBG MONITORING, ED - Abnormal; Notable for the following components:   Glucose-Capillary 482 (*)    All other components within normal limits  CBG MONITORING, ED -  Abnormal; Notable  for the following components:   Glucose-Capillary 404 (*)    All other components within normal limits  CBG MONITORING, ED - Abnormal; Notable for the following components:   Glucose-Capillary 337 (*)    All other components within normal limits  CBG MONITORING, ED - Abnormal; Notable for the following components:   Glucose-Capillary 281 (*)    All other components within normal limits  CBG MONITORING, ED - Abnormal; Notable for the following components:   Glucose-Capillary 289 (*)    All other components within normal limits  CBG MONITORING, ED - Abnormal; Notable for the following components:   Glucose-Capillary 204 (*)    All other components within normal limits  RESP PANEL BY RT-PCR (FLU A&B, COVID) ARPGX2  CULTURE, BLOOD (SINGLE)  CULTURE, BLOOD (SINGLE)  CBC WITH DIFFERENTIAL/PLATELET  BETA-HYDROXYBUTYRIC ACID  LACTIC ACID, PLASMA  LACTIC ACID, PLASMA  CBC WITH DIFFERENTIAL/PLATELET  OSMOLALITY  HEMOGLOBIN A1C  CBC WITH DIFFERENTIAL/PLATELET  BASIC METABOLIC PANEL    ____________________________________________  EKG:  ________________________________________  RADIOLOGY All imaging, including plain films, CT scans, and ultrasounds, independently reviewed by me, and interpretations confirmed via formal radiology reads.  ED MD interpretation:     Official radiology report(s): No results found.  ____________________________________________  PROCEDURES   Procedure(s) performed (including Critical Care):  .Critical Care Performed by: Shaune Pollack, MD Authorized by: Shaune Pollack, MD   Critical care provider statement:    Critical care time (minutes):  35   Critical care time was exclusive of:  Separately billable procedures and treating other patients and teaching time   Critical care was necessary to treat or prevent imminent or life-threatening deterioration of the following conditions:  Circulatory failure, cardiac failure and sepsis   Critical care  was time spent personally by me on the following activities:  Development of treatment plan with patient or surrogate, discussions with consultants, evaluation of patient's response to treatment, examination of patient, obtaining history from patient or surrogate, ordering and performing treatments and interventions, ordering and review of laboratory studies, ordering and review of radiographic studies, pulse oximetry, re-evaluation of patient's condition and review of old charts   I assumed direction of critical care for this patient from another provider in my specialty: no   .Marland KitchenIncision and Drainage  Date/Time: 10/19/2020 12:41 PM Performed by: Shaune Pollack, MD Authorized by: Shaune Pollack, MD   Consent:    Consent obtained:  Verbal   Consent given by:  Patient   Risks, benefits, and alternatives were discussed: yes     Risks discussed:  Bleeding, damage to other organs, incomplete drainage, infection and pain   Alternatives discussed:  Alternative treatment Universal protocol:    Procedure explained and questions answered to patient or proxy's satisfaction: no     Relevant documents present and verified: no     Test results available : no     Imaging studies available: no     Required blood products, implants, devices, and special equipment available: no     Site/side marked: no     Immediately prior to procedure, a time out was called: no     Patient identity confirmed:  Verbally with patient Location:    Type:  Abscess   Size:  2 x 1   Location:  Upper extremity   Upper extremity location:  Arm   Arm location:  R lower arm Pre-procedure details:    Skin preparation:  Chlorhexidine Sedation:    Sedation type:  None Anesthesia:  Anesthesia method:  Local infiltration   Local anesthetic:  Lidocaine 1% w/o epi Procedure type:    Complexity:  Simple    ____________________________________________  INITIAL IMPRESSION / MDM / ASSESSMENT AND PLAN / ED COURSE  As part  of my medical decision making, I reviewed the following data within the electronic MEDICAL RECORD NUMBER Nursing notes reviewed and incorporated, Old chart reviewed, Notes from prior ED visits, and [place] Controlled Substance Database       *Kellie SimmeringStacy L Bales was evaluated in Emergency Department on 10/19/2020 for the symptoms described in the history of present illness. He was evaluated in the context of the global COVID-19 pandemic, which necessitated consideration that the patient might be at risk for infection with the SARS-CoV-2 virus that causes COVID-19. Institutional protocols and algorithms that pertain to the evaluation of patients at risk for COVID-19 are in a state of rapid change based on information released by regulatory bodies including the CDC and federal and state organizations. These policies and algorithms were followed during the patient's care in the ED.  Some ED evaluations and interventions may be delayed as a result of limited staffing during the pandemic.*     Medical Decision Making:  55 yo M here with R forearm cellulitis and hyperglycemia. Labs show profound hyperglycemia with Glu >1000, but no ketones or AG. Suspect HHS in setting of medication non-adherence and cellulitis. Exam is c/w focal cellulitis of R forearm, L thumb 2/2 IVDU. I&D performed but suspect most of redness is cellulitis at this time. No signs of necrotizing infection or sepsis. LA normal. Will start broad-spectrum ABX given prior Serratia on cultures and IVDU, admit to medicine.  ____________________________________________  FINAL CLINICAL IMPRESSION(S) / ED DIAGNOSES  Final diagnoses:  Right arm cellulitis  HHNC (hyperglycemic hyperosmolar nonketotic coma) (HCC)  Cellulitis of finger of left hand     MEDICATIONS GIVEN DURING THIS VISIT:  Medications  insulin regular, human (MYXREDLIN) 100 units/ 100 mL infusion (1.5 Units/hr Intravenous Rate/Dose Change 10/19/20 1838)  lactated ringers infusion (  Intravenous New Bag/Given 10/19/20 1224)  dextrose 5 % in lactated ringers infusion ( Intravenous Not Given 10/19/20 1844)  dextrose 50 % solution 0-50 mL (has no administration in time range)  potassium chloride 10 mEq in 100 mL IVPB (10 mEq Intravenous Not Given 10/19/20 1239)  amLODipine (NORVASC) tablet 10 mg (10 mg Oral Not Given 10/19/20 1740)  DULoxetine (CYMBALTA) DR capsule 20 mg (20 mg Oral Given 10/19/20 1741)  haloperidol (HALDOL) tablet 0.5 mg (has no administration in time range)  acidophilus (RISAQUAD) capsule 2 capsule (2 capsules Oral Given 10/19/20 1739)  dicyclomine (BENTYL) tablet 20 mg (20 mg Oral Given 10/19/20 1739)  trihexyphenidyl (ARTANE) tablet 1 mg (has no administration in time range)  multivitamin with minerals tablet 1 tablet (1 tablet Oral Given 10/19/20 1739)  enoxaparin (LOVENOX) injection 40 mg (40 mg Subcutaneous Patient Refused/Not Given 10/19/20 1740)  lactated ringers infusion (0 mLs Intravenous Stopped 10/19/20 1844)  dextrose 5 % in lactated ringers infusion ( Intravenous New Bag/Given 10/19/20 1843)  vancomycin (VANCOREADY) IVPB 750 mg/150 mL (has no administration in time range)  nicotine (NICODERM CQ - dosed in mg/24 hours) patch 14 mg (14 mg Transdermal Patch Applied 10/19/20 1744)  sodium chloride 0.9 % bolus 1,000 mL (0 mLs Intravenous Stopped 10/19/20 1100)  cefTRIAXone (ROCEPHIN) 2 g in sodium chloride 0.9 % 100 mL IVPB (0 g Intravenous Stopped 10/19/20 1059)  lidocaine-EPINEPHrine (XYLOCAINE W/EPI) 2 %-1:100000 (with pres) injection 20 mL (20  mLs Intradermal Given by Other 10/19/20 1119)  fentaNYL (SUBLIMAZE) injection 75 mcg (75 mcg Intravenous Given 10/19/20 1048)  ondansetron (ZOFRAN) injection 4 mg (4 mg Intravenous Given 10/19/20 1050)  vancomycin (VANCOREADY) IVPB 1250 mg/250 mL (0 mg Intravenous Stopped 10/19/20 1535)  potassium chloride SA (KLOR-CON) CR tablet 20 mEq (20 mEq Oral Given 10/19/20 1255)     ED Discharge Orders    None       Note:   This document was prepared using Dragon voice recognition software and may include unintentional dictation errors.   Shaune Pollack, MD 10/19/20 2027

## 2020-10-19 NOTE — ED Notes (Signed)
Per lab, will add on A1C to previously sent labs

## 2020-10-19 NOTE — ED Notes (Addendum)
Pt given cup of ice water at this time. Water ok per MD Erma Heritage

## 2020-10-19 NOTE — ED Notes (Addendum)
2 IV insertion attempts unsuccessful at this time. Sam RN to attempt as well. IV team consult placed.

## 2020-10-19 NOTE — ED Notes (Addendum)
MD Agbata messaged at this time regarding pt concerns about painful cramping in bilateral legs. Pt noted to be tearful at this time. No new orders at this time.

## 2020-10-19 NOTE — H&P (Signed)
History and Physical    Earl Moreno YQM:[id number] DOB: 1966/05/05 DOA: 10/19/2020  PCP: Patient, No Pcp Per (Inactive)   Patient coming from: Home  I have personally briefly reviewed patient's old medical records in [facility]  Chief Complaint: Abscess involving the right forearm/left forearm  HPI: Earl Moreno is a 55 y.o. male with medical history significant for IV drug use (patient admits to being a heroin addict) and uses intranasal cocaine as well.  He was brought into the emergency room for evaluation of multiple abscesses involving his right and left forearm as well as left hand.  Patient was diagnosed with diabetes mellitus in September, 2021 but has been noncompliant with prescribed insulin because he said he lost it. He complains of increased thirst and drinks 3 to 4 gallons of soda or water daily, progressive weight loss, blurred vision and increased frequency of urination. He denies having any fever, no chills, no headache, no nausea, no vomiting, no chest pain, no abdominal pain, no changes in his bowel habits, no diaphoresis, no palpitations, no focal deficits. Labs show sodium 126, potassium 4.1, chloride 91, glucose 724, BUN 11, creatinine 0.72, calcium 8.4, lactic acid 1.6 >> 0.8, white count 6.9, hemoglobin 13.7, hematocrit 39.0, MCV 85.2, RDW 12.3, platelet count 186 Respiratory viral panel is pending    ED Course: Patient is a 55 year old Caucasian male with a history significant for IV drug abuse who presents to the ER for evaluation of multiple abscesses involving both forearms as well as his left hand.  He also has significant hyperglycemia related to medication noncompliance. He was started on an insulin drip in the ER and will be admitted to the hospital for further evaluation.   Review of Systems: As per HPI otherwise all other systems reviewed and negative.    Past Medical History:  Diagnosis Date  . Diabetes mellitus without complication [facility])      Past Surgical History:  Procedure Laterality Date  . I & D EXTREMITY Left 06/05/2018   Procedure: IRRIGATION AND DEBRIDEMENT EXTREMITY-LEFT HAND;  Surgeon: Lyndle Herrlich, MD;  Location: [facility] ORS;  Service: Orthopedics;  Laterality: Left;     reports that he has been smoking. He has been smoking about 0.50 packs per day. He has never used smokeless tobacco. He reports current drug use. Drugs: Cocaine and IV. He reports that he does not drink alcohol.  No Known Allergies  Family History  Problem Relation Age of Onset  . Diabetes Mother       Prior to Admission medications   Medication Sig Start Date End Date Taking? Authorizing Provider  acidophilus (RISAQUAD) CAPS capsule Take 2 capsules by mouth 3 (three) times daily. 02/14/20   Marguerita Merles Latif, DO  amLODipine (NORVASC) 10 MG tablet Take 1 tablet (10 mg total) by mouth daily. 02/14/20   Marguerita Merles Latif, DO  cloNIDine (CATAPRES) 0.1 MG tablet Take 0.5 tablets (0.05 mg total) by mouth 2 (two) times daily for 2 doses. 02/14/20 02/15/20  Marguerita Merles Latif, DO  dicyclomine (BENTYL) 20 MG tablet Take 1 tablet (20 mg total) by mouth every 6 (six) hours as needed for spasms (abdominal cramping). 02/14/20   Marguerita Merles Latif, DO  DULoxetine (CYMBALTA) 20 MG capsule Take 1 capsule (20 mg total) by mouth daily. 02/14/20 03/15/20  Merlene Laughter, DO  Ensure Max Protein (ENSURE MAX PROTEIN) LIQD Take 330 mLs (11 oz total) by mouth 2 (two) times daily between meals. 02/14/20   Marguerita Merles  Latif, DO  haloperidol (HALDOL) 0.5 MG tablet Take 1 tablet (0.5 mg total) by mouth 3 (three) times daily. 02/14/20   Marguerita MerlesSheikh, Omair Latif, DO  hydrocortisone cream 1 % Apply topically 2 (two) times daily. 02/14/20   Marguerita MerlesSheikh, Omair Latif, DO  insulin aspart protamine - aspart (NOVOLOG 70/30 MIX) (70-30) 100 UNIT/ML FlexPen Inject 0.35 mLs (35 Units total) into the skin 2 (two) times daily. 02/14/20   [name], Kateri Mcmair Latif, DO  Insulin Pen Needle 32G X 4 MM  MISC 1 Container by Does not apply route in the morning and at bedtime. 02/14/20   Marguerita MerlesSheikh, Omair Latif, DO  levofloxacin (LEVAQUIN) 750 MG tablet Take 1 tablet (750 mg total) by mouth daily. 02/14/20   Marguerita MerlesSheikh, Omair Latif, DO  loperamide (IMODIUM) 2 MG capsule Take 1-2 capsules (2-4 mg total) by mouth as needed for diarrhea or loose stools (diarrhea). 02/14/20   Marguerita MerlesSheikh, Omair Latif, DO  methocarbamol (ROBAXIN) 500 MG tablet Take 1 tablet (500 mg total) by mouth every 8 (eight) hours as needed for muscle spasms. 02/14/20   Marguerita MerlesSheikh, Omair Latif, DO  Multiple Vitamin (MULTIVITAMIN WITH MINERALS) TABS tablet Take 1 tablet by mouth daily. 02/14/20   Marguerita MerlesSheikh, Omair Latif, DO  nicotine polacrilex (NICORETTE) 2 MG gum Take 1 each (2 mg total) by mouth as needed for smoking cessation. 02/14/20   Marguerita MerlesSheikh, Omair Latif, DO  ondansetron (ZOFRAN-ODT) 4 MG disintegrating tablet Take 1 tablet (4 mg total) by mouth every 6 (six) hours as needed for nausea or vomiting. 02/14/20   Marguerita MerlesSheikh, Omair Latif, DO  trihexyphenidyl (ARTANE) 2 MG tablet Take 0.5 tablets (1 mg total) by mouth 2 (two) times daily with a meal. 02/14/20   Marguerita MerlesSheikh, Omair [place], [place]    Physical Exam: Vitals:   10/19/20 0848 10/19/20 0907 10/19/20 0908 10/19/20 1100  BP:    (!) 127/92  Pulse:  89 96 74  Resp:  16 20 10   Temp:      TempSrc:      SpO2:  94% 94% 96%  Weight: 59 kg     Height: 5\' 8"  (1.727 m)        Vitals:   10/19/20 0848 10/19/20 0907 10/19/20 0908 10/19/20 1100  BP:    (!) 127/92  Pulse:  89 96 74  Resp:  16 20 10   Temp:      TempSrc:      SpO2:  94% 94% 96%  Weight: 59 kg     Height: 5\' 8"  (1.727 m)         Constitutional: Alert and oriented x 3. Not in any apparent distress HEENT:      Head: Normocephalic and atraumatic.         Eyes: PERLA, EOMI, Conjunctivae are normal. Sclera is non-icteric.       Mouth/Throat: Mucous membranes are dry.       Neck: Supple with no signs of meningismus. Cardiovascular: Regular rate and  rhythm. No murmurs, gallops, or rubs. 2+ symmetrical distal pulses are present . No JVD. No LE edema Respiratory: Respiratory effort normal .Lungs sounds clear bilaterally. No wheezes, crackles, or rhonchi.  Gastrointestinal: Soft, non tender, and non distended with positive bowel sounds.  Genitourinary: No CVA tenderness. Musculoskeletal:  Abscess over the right forearm, draining, area of induration involving the left forearm and redness over the left hand with differential warmth Neurologic:  Face is symmetric. Moving all extremities. No gross focal neurologic deficits  Skin: Skin is warm, dry.  No rash  or ulcers Psychiatric: Mood and affect are normal   Labs on Admission: I have personally reviewed following labs and imaging studies  CBC: Recent Labs  Lab 10/19/20 0857 10/19/20 1202  WBC 7.2 6.9  NEUTROABS 5.2 4.9  HGB 14.3 13.7  HCT 41.8 39.0  MCV 87.8 85.2  PLT 199 186   Basic Metabolic Panel: Recent Labs  Lab 10/19/20 0857 10/19/20 1202  NA 124* 126*  K 4.3 4.1  CL 85* 91*  CO2 27 25  GLUCOSE 1,061* 724*  BUN 10 8  CREATININE 0.92 0.72  CALCIUM 8.8* 8.4*   GFR: Estimated Creatinine Clearance: 87.1 mL/min (by C-G formula based on SCr of 0.72 mg/dL). Liver Function Tests: Recent Labs  Lab 10/19/20 0857  AST 17  ALT 12  ALKPHOS 122  BILITOT 0.8  PROT 8.1  ALBUMIN 3.8   No results for input(s): LIPASE, AMYLASE in the last 168 hours. No results for input(s): AMMONIA in the last 168 hours. Coagulation Profile: No results for input(s): INR, PROTIME in the last 168 hours. Cardiac Enzymes: No results for input(s): CKTOTAL, CKMB, CKMBINDEX, TROPONINI in the last 168 hours. BNP (last 3 results) No results for input(s): PROBNP in the last 8760 hours. HbA1C: No results for input(s): HGBA1C in the last 72 hours. CBG: Recent Labs  Lab 10/19/20 0847 10/19/20 1213  GLUCAP >600* 597*   Lipid Profile: No results for input(s): CHOL, HDL, LDLCALC, TRIG, CHOLHDL,  LDLDIRECT in the last 72 hours. Thyroid Function Tests: No results for input(s): TSH, T4TOTAL, FREET4, T3FREE, THYROIDAB in the last 72 hours. Anemia Panel: No results for input(s): VITAMINB12, FOLATE, FERRITIN, TIBC, IRON, RETICCTPCT in the last 72 hours. Urine analysis:    Component Value Date/Time   COLORURINE STRAW (A) 10/19/2020 0958   APPEARANCEUR CLEAR (A) 10/19/2020 0958   APPEARANCEUR Cloudy 12/30/2012 1018   LABSPEC 1.030 10/19/2020 0958   LABSPEC 1.017 12/30/2012 1018   PHURINE 6.0 10/19/2020 0958   GLUCOSEU >=500 (A) 10/19/2020 0958   GLUCOSEU Negative 12/30/2012 1018   HGBUR NEGATIVE 10/19/2020 0958   BILIRUBINUR NEGATIVE 10/19/2020 0958   BILIRUBINUR Negative 12/30/2012 1018   KETONESUR NEGATIVE 10/19/2020 0958   PROTEINUR NEGATIVE 10/19/2020 0958   NITRITE NEGATIVE 10/19/2020 0958   LEUKOCYTESUR NEGATIVE 10/19/2020 0958   LEUKOCYTESUR Negative 12/30/2012 1018    Radiological Exams on Admission: No results found.   Assessment/Plan Principal Problem:   Hyperglycemia due to diabetes mellitus (HCC) Active Problems:   Cellulitis of left hand   Heroin addiction (HCC)   Hyponatremia   Essential hypertension    Hyperglycemia due to type 2 diabetes mellitus Patient was diagnosed with diabetes mellitus in September 2021 but has been noncompliant with prescribed medications. He presents for evaluation of significant weight loss, increased thirst, urinary frequency and fatigue Initial blood sugar was greater than 1000g/dl  Continue aggressive IV fluid hydration Continue insulin drip Obtain hemoglobin A1c levels Patient will require diabetic education and nutrition evaluation   Multiple abscesses Patient with skin/soft tissue infection involving both wrists and left forearm Place patient on IV vancomycin for MRSA coverage Consult surgery for incision and drainage   Hypertension Blood pressure is stable   Hyponatremia Secondary to significant  hyperglycemia Expect improvement in serum sodium levels with hydration and resolution of hyperglycemia   Polysubstance dependence Patient admits to heroin and cocaine use Patient has been counseled on the need to abstain from illicit drug use     DVT prophylaxis: Lovenox Code Status: full code Family Communication: Greater  than 50% of time was spent discussing patient's condition and plan of care with him at the bedside.  All questions and concerns have been addressed.  He verbalizes understanding and agrees with the plan. Disposition Plan: Back to previous home environment Consults called: Surgery Status: At the time of admission, it appears that the appropriate admission status for this patient is inpatient.   This is judged to be reasonable and necessary to provide the required intensity of service to ensure the patient's safety given the presenting symptoms, physical exam findings and initial radiographic and laboratory data in the context of the comorbid conditions. Patient requires inpatient status due to high intensity of service, high risk of further deterioration and high recurrence of surveillance required.    Lucile Shutters MD [facility]     10/19/2020, 12:55 PM

## 2020-10-19 NOTE — ED Notes (Signed)
IV team at bedside 

## 2020-10-19 NOTE — ED Notes (Signed)
Lab to come draw hemoglobin A1C

## 2020-10-19 NOTE — Progress Notes (Addendum)
Inpatient Diabetes Program Recommendations  AACE/ADA: New Consensus Statement on Inpatient Glycemic Control (2015)  Target Ranges:  Prepandial:   less than 140 mg/dL      Peak postprandial:   less than 180 mg/dL (1-2 hours)      Critically ill patients:  140 - 180 mg/dL   Lab Results  Component Value Date   GLUCAP >600 (HH) 10/19/2020   HGBA1C >15.5 (H) 02/09/2020    Review of Glycemic Control  Diabetes history: DM2 Outpatient Diabetes medications: 70/30 35 units bid Current orders for Inpatient glycemic control: IV insulin  Inpatient Diabetes Program Recommendations:   Noted patient currently in ED with blood glucose 1061. Patient was started on 70/30 insulin admission 02/09/20 with A1c @ that admission >15.5. Will follow patient during hospitalization and review current pending A1c with patient.  When patient meets criteria for transition from IV insulin, consider: -Levemir 25 units bid (home basal insulin dose = approximately 49 units total)-Give 2 hrs prior to D/C of insulin drip and cover CBG @ time insulin drip discontinued with Novolog correction 0-9 units q 4 hrs. While NPO then tid + hs 0-5 units -Add Novolog meal coverage tid when eating 50% meals Will plan to speak with patient regarding insulin and diabetes home management.  Thank you, Earl Moreno. Julya Alioto, RN, MSN, CDE  Diabetes Coordinator Inpatient Glycemic Control Team Team Pager [phone] (8am-5pm) 10/19/2020 11:39 AM

## 2020-10-19 NOTE — ED Notes (Addendum)
Pt unable to tolerate IV potassium chloride at this time due to severe burning. MD Isaacs aware, see new orders for PO potassium chloride in place of IV.

## 2020-10-19 NOTE — ED Notes (Signed)
Pt noted to be sitting on side of bed using personal cellphone, no needs at this time

## 2020-10-19 NOTE — ED Notes (Signed)
Pt up to toilet at this time. ?

## 2020-10-19 NOTE — Consult Note (Signed)
Pharmacy Antibiotic Note  Earl Moreno is a 55 y.o. male admitted on 10/19/2020 with abscess in right forearm. Patient with medical history of IV drug abuse and diabetes.  Pharmacy has been consulted for Vancomycin dosing.  Plan:  Vancomycin 1250 mg IV LD x 1 ordered, followed by  Vancomycin 750 mg Q12H. Goal AUC 400-550  Expected AUC: 460/ trough 12.6  Scr used: 0.8  Monitor renal function  Follow up cultures/ plans for I&D   Levels at steady state if warranted     Height: 5\' 8"  (172.7 cm) Weight: 59 kg (130 lb) IBW/kg (Calculated) : 68.4  Temp (24hrs), Avg:98.2 F (36.8 C), Min:98.2 F (36.8 C), Max:98.2 F (36.8 C)  Recent Labs  Lab 10/19/20 0857 10/19/20 0957 10/19/20 1202  WBC 7.2  --  6.9  CREATININE 0.92  --  0.72  LATICACIDVEN  --  1.6 0.8    Estimated Creatinine Clearance: 87.1 mL/min (by C-G formula based on SCr of 0.72 mg/dL).    No Known Allergies  Antimicrobials this admission: 5/31 ceftriaxone >> x 1  5/31 Vancomycin >>   Dose adjustments this admission: n/a  Microbiology results: 5/31 BCx: sent   Thank you for allowing pharmacy to be a part of this patient's care.  6/31, PharmD, BCPS Clinical Pharmacist  10/19/2020 1:20 PM

## 2020-10-19 NOTE — ED Triage Notes (Signed)
First Nurse Note:  C/O right forearm abscess and left fore arm abscess.  States has had similar before and blood sugar was elevated also.

## 2020-10-19 NOTE — ED Notes (Signed)
Called respiratory to inform of VBG sent to lab at this time

## 2020-10-19 NOTE — Progress Notes (Signed)
PHARMACY -  BRIEF ANTIBIOTIC NOTE   Pharmacy has received consult(s) for Vancomycin from an ED provider.  The patient's profile has been reviewed for ht/wt/allergies/indication/available labs.    One time order(s) placed for Vancomycin 1250 mg  Further antibiotics/pharmacy consults should be ordered by admitting physician if indicated.                       Thank you, Donna Snooks A 10/19/2020  9:31 AM

## 2020-10-20 ENCOUNTER — Other Ambulatory Visit: Payer: Self-pay

## 2020-10-20 LAB — CBC WITH DIFFERENTIAL/PLATELET
Abs Immature Granulocytes: 0.04 10*3/uL (ref 0.00–0.07)
Basophils Absolute: 0 10*3/uL (ref 0.0–0.1)
Basophils Relative: 1 %
Eosinophils Absolute: 0.2 10*3/uL (ref 0.0–0.5)
Eosinophils Relative: 3 %
HCT: 38.5 % — ABNORMAL LOW (ref 39.0–52.0)
Hemoglobin: 13.6 g/dL (ref 13.0–17.0)
Immature Granulocytes: 1 %
Lymphocytes Relative: 32 %
Lymphs Abs: 2.2 10*3/uL (ref 0.7–4.0)
MCH: 30 pg (ref 26.0–34.0)
MCHC: 35.3 g/dL (ref 30.0–36.0)
MCV: 85 fL (ref 80.0–100.0)
Monocytes Absolute: 0.4 10*3/uL (ref 0.1–1.0)
Monocytes Relative: 6 %
Neutro Abs: 4 10*3/uL (ref 1.7–7.7)
Neutrophils Relative %: 57 %
Platelets: 152 10*3/uL (ref 150–400)
RBC: 4.53 MIL/uL (ref 4.22–5.81)
RDW: 12.3 % (ref 11.5–15.5)
WBC: 6.9 10*3/uL (ref 4.0–10.5)
nRBC: 0 % (ref 0.0–0.2)

## 2020-10-20 LAB — BASIC METABOLIC PANEL
Anion gap: 7 (ref 5–15)
BUN: 8 mg/dL (ref 6–20)
CO2: 28 mmol/L (ref 22–32)
Calcium: 8.6 mg/dL — ABNORMAL LOW (ref 8.9–10.3)
Chloride: 99 mmol/L (ref 98–111)
Creatinine, Ser: 0.51 mg/dL — ABNORMAL LOW (ref 0.61–1.24)
GFR, Estimated: >60 mL/min (ref >60)
Glucose, Bld: 170 mg/dL — ABNORMAL HIGH (ref 70–99)
Potassium: 3.3 mmol/L — ABNORMAL LOW (ref 3.5–5.1)
Sodium: 134 mmol/L — ABNORMAL LOW (ref 135–145)

## 2020-10-20 LAB — CBG MONITORING, ED
Glucose-Capillary: 145 mg/dL — ABNORMAL HIGH (ref 70–99)
Glucose-Capillary: 152 mg/dL — ABNORMAL HIGH (ref 70–99)
Glucose-Capillary: 158 mg/dL — ABNORMAL HIGH (ref 70–99)
Glucose-Capillary: 167 mg/dL — ABNORMAL HIGH (ref 70–99)
Glucose-Capillary: 172 mg/dL — ABNORMAL HIGH (ref 70–99)
Glucose-Capillary: 179 mg/dL — ABNORMAL HIGH (ref 70–99)
Glucose-Capillary: 202 mg/dL — ABNORMAL HIGH (ref 70–99)
Glucose-Capillary: 211 mg/dL — ABNORMAL HIGH (ref 70–99)
Glucose-Capillary: 263 mg/dL — ABNORMAL HIGH (ref 70–99)
Glucose-Capillary: 336 mg/dL — ABNORMAL HIGH (ref 70–99)
Glucose-Capillary: 351 mg/dL — ABNORMAL HIGH (ref 70–99)
Glucose-Capillary: 409 mg/dL — ABNORMAL HIGH (ref 70–99)

## 2020-10-20 MED ORDER — SODIUM CHLORIDE 0.9 % IV SOLN: 1.0000 g | INTRAVENOUS | Status: DC

## 2020-10-20 MED ORDER — INSULIN LISPRO PROT & LISPRO (75-25 MIX) 100 UNIT/ML KWIKPEN
30.0000 [IU] | PEN_INJECTOR | Freq: Two times a day (BID) | SUBCUTANEOUS | 11 refills | Status: DC
  Filled 2020-10-20 (×2): qty 15, 25d supply, fill #0

## 2020-10-20 MED ORDER — DOXYCYCLINE HYCLATE 100 MG PO TABS
100.0000 mg | ORAL_TABLET | Freq: Two times a day (BID) | ORAL | 0 refills | Status: AC
  Filled 2020-10-20: qty 14, 7d supply, fill #0

## 2020-10-20 MED ORDER — RIGHTEST GL300 LANCETS MISC
99 refills | Status: DC
  Filled 2020-10-20: qty 100, 25d supply, fill #0

## 2020-10-20 MED ORDER — COMFORT EZ PEN NEEDLES 32G X 4 MM MISC
1.0000 | Freq: Two times a day (BID) | 1 refills | Status: DC
Start: 2020-10-20 — End: 2021-02-26
  Filled 2020-10-20: qty 100, 50d supply, fill #0

## 2020-10-20 MED ORDER — INSULIN ASPART 100 UNIT/ML IJ SOLN
0.0000 [IU] | Freq: Three times a day (TID) | INTRAMUSCULAR | Status: DC
  Administered 2020-10-20: 15 [IU] via SUBCUTANEOUS
  Filled 2020-10-20: qty 1

## 2020-10-20 MED ORDER — INSULIN ASPART 100 UNIT/ML IJ SOLN: 0.0000 [IU] | Freq: Every day | INTRAMUSCULAR | Status: DC

## 2020-10-20 MED ORDER — RIGHTEST GS550 BLOOD GLUCOSE VI STRP
ORAL_STRIP | 99 refills | Status: DC
  Filled 2020-10-20: qty 100, 25d supply, fill #0

## 2020-10-20 MED ORDER — INSULIN DETEMIR 100 UNIT/ML ~~LOC~~ SOLN
20.0000 [IU] | Freq: Every day | SUBCUTANEOUS | Status: DC
  Administered 2020-10-20: 20 [IU] via SUBCUTANEOUS
  Filled 2020-10-20 (×2): qty 0.2

## 2020-10-20 MED ORDER — BLOOD GLUCOSE MONITOR SYSTEM W/DEVICE KIT
PACK | 0 refills | Status: DC
  Filled 2020-10-20: qty 1, 30d supply, fill #0

## 2020-10-20 MED ORDER — DULOXETINE HCL 20 MG PO CPEP
20.0000 mg | ORAL_CAPSULE | Freq: Every day | ORAL | 3 refills | Status: DC
  Filled 2020-10-20: qty 30, 30d supply, fill #0

## 2020-10-20 MED ORDER — AMLODIPINE BESYLATE 10 MG PO TABS
10.0000 mg | ORAL_TABLET | Freq: Every day | ORAL | 1 refills | Status: DC
  Filled 2020-10-20: qty 30, 30d supply, fill #0

## 2020-10-20 MED ORDER — INSULIN ASPART 100 UNIT/ML IJ SOLN
4.0000 [IU] | Freq: Three times a day (TID) | INTRAMUSCULAR | Status: DC
  Administered 2020-10-20: 4 [IU] via SUBCUTANEOUS
  Filled 2020-10-20: qty 1

## 2020-10-20 MED ORDER — ADULT MULTIVITAMIN W/MINERALS CH: 1.0000 | ORAL_TABLET | Freq: Every day | ORAL | 0 refills | Status: DC

## 2020-10-20 MED ORDER — NICOTINE 14 MG/24HR TD PT24
14.0000 mg | MEDICATED_PATCH | Freq: Every day | TRANSDERMAL | 0 refills | Status: DC
  Filled 2020-10-20: qty 28, 28d supply, fill #0

## 2020-10-20 NOTE — ED Notes (Signed)
Per MD Sumayya , Pt to be discharge once Vancomycin has been completed .  Discharge instructions reviewed with pt. Pt calm , collective , understood discharge instructions

## 2020-10-20 NOTE — Discharge Summary (Signed)
Physician Discharge Summary  Earl Moreno WYO:[id number] DOB: 1966-02-27 DOA: 10/19/2020  PCP: Patient, No Pcp Per (Inactive)  Admit date: 10/19/2020 Discharge date: 10/20/2020  Admitted From: Home Disposition: Home  Recommendations for Outpatient Follow-up:  1. Follow up with PCP in 1-2 weeks 2. Please obtain BMP/CBC in one week 3. Please follow up on the following pending results: None  Home Health: No Equipment/Devices: None Discharge Condition: Stable CODE STATUS: Full Diet recommendation: Heart Healthy / Carb Modified   Brief/Interim Summary: Earl Moreno is a 55 y.o. male with medical history significant forIV drug use(patient admits to being a heroin addict)and uses intranasal cocaine as well. He was brought into the emergency room for evaluation of multiple abscesses involving his right and left forearm as well as left hand.  Patient was diagnosed with diabetes mellitus in September, 2021 but has been noncompliant with prescribed insulin because he said he lost it. He complains of increased thirst and drinks 3 to 4 gallons of soda or water daily, progressive weight loss, blurred vision and increased frequency of urination.  He was found to have hyperosmolar hyperglycemia secondary to type 2 diabetes mellitus, he was on 70/30 insulin at home which he was not using for more than a month.  He was initially managed with insulin infusion and later transition to long-acting and SSI.  He was discharged home on Humalog 75/25 which is available at medication management clinic as patient is unable to afford his medications.  Patient with an extensive history of IV drug abuse, multiple needle marks resulted in multiple small abscesses on his forearm.  Because of his complaint of pain and concern of another abscess on right forearm general surgery was consulted but he does not need any incision and drainage as abscess was self draining.  His abscesses were also drained by ED providers on  different occasions.  He received vancomycin while in the hospital and discharged on doxycycline.  Extensive counseling was provided for IV drug abuse.  No fever or chills.  Blood cultures negative.  Patient was provided with resources for drug abuse and connected with medication management clinic.  High risk for readmission due to being noncompliant.  Patient was also asked to get himself established with community health care centers for further management.  Discharge Diagnoses:  Principal Problem:   Hyperglycemia due to diabetes mellitus ([place]) Active Problems:   Cellulitis of left hand   Heroin addiction ([place])   Hyponatremia   Essential hypertension   Discharge Instructions  Discharge Instructions    Diet - low sodium heart healthy   Complete by: As directed    Discharge instructions   Complete by: As directed    It was pleasure taking care of you. You are being given antibiotics for 7 days for your arm, please avoid using any IV drugs. You are also being provided with insulin, use 30 units twice a day and check your blood glucose level 3-4 times daily to adjust further dosage. Please follow-up closely with your primary care provider for further management of your diabetes.   Increase activity slowly   Complete by: As directed      Allergies as of 10/20/2020   No Known Allergies     Medication List    STOP taking these medications   acidophilus Caps capsule   cloNIDine 0.1 MG tablet Commonly known as: CATAPRES   dicyclomine 20 MG tablet Commonly known as: BENTYL   haloperidol 0.5 MG tablet Commonly known as: HALDOL  hydrocortisone cream 1 %   insulin aspart protamine - aspart (70-30) 100 UNIT/ML FlexPen Commonly known as: NOVOLOG 70/30 MIX   levofloxacin 750 MG tablet Commonly known as: LEVAQUIN   loperamide 2 MG capsule Commonly known as: IMODIUM   methocarbamol 500 MG tablet Commonly known as: ROBAXIN   nicotine polacrilex 2 MG gum Commonly known  as: NICORETTE   ondansetron 4 MG disintegrating tablet Commonly known as: ZOFRAN-ODT   trihexyphenidyl 2 MG tablet Commonly known as: ARTANE     TAKE these medications   amLODipine 10 MG tablet Commonly known as: NORVASC Take 1 tablet (10 mg total) by mouth daily.   blood glucose meter kit and supplies Kit Dispense based on patient and insurance preference. Use up to four times daily as directed.   doxycycline 100 MG tablet Commonly known as: VIBRA-TABS Take 1 tablet (100 mg total) by mouth 2 (two) times daily for 7 days.   DULoxetine 20 MG capsule Commonly known as: CYMBALTA Take 1 capsule (20 mg total) by mouth daily. Start taking on: October 21, 2020   Ensure Max Protein Liqd Take 330 mLs (11 oz total) by mouth 2 (two) times daily between meals.   insulin lispro 100 UNIT/ML KwikPen Commonly known as: HUMALOG Inject 30 Units into the skin 2 (two) times daily.   Insulin Pen Needle 32G X 4 MM Misc 1 Container by Does not apply route in the morning and at bedtime.   multivitamin with minerals Tabs tablet Take 1 tablet by mouth daily.   nicotine 14 mg/24hr patch Commonly known as: NICODERM CQ - dosed in mg/24 hours Place 1 patch (14 mg total) onto the skin daily. Start taking on: October 21, 2020       No Known Allergies  Consultations:  General surgery  Procedures/Studies:  No results found.  Subjective: Patient was seen and examined today.  Resting comfortably, wife at bedside.  No complaints.  Per patient he was not using his insulin for more than a month, stating that he could not afford it. Uses IV drugs 3-4 times a week.  Counseling was provided.  Discharge Exam: Vitals:   10/20/20 1245 10/20/20 1300  BP:  119/78  Pulse: (!) 59 (!) 59  Resp: 11 15  Temp:    SpO2: 98% 94%   Vitals:   10/20/20 1215 10/20/20 1230 10/20/20 1245 10/20/20 1300  BP:  111/77  119/78  Pulse: 64 (!) 56 (!) 59 (!) 59  Resp: 15 13 11 15   Temp:      TempSrc:      SpO2: 94%  92% 98% 94%  Weight:      Height:        General: Pt is alert, awake, not in acute distress Cardiovascular: RRR, S1/S2 +, no rubs, no gallops Respiratory: CTA bilaterally, no wheezing, no rhonchi Abdominal: Soft, NT, ND, bowel sounds + Extremities: no edema, no cyanosis   The results of significant diagnostics from this hospitalization (including imaging, microbiology, ancillary and laboratory) are listed below for reference.    Microbiology: Recent Results (from the past 240 hour(s))  Blood culture (single)     Status: None (Preliminary result)   Collection Time: 10/19/20  9:57 AM   Specimen: BLOOD  Result Value Ref Range Status   Specimen Description BLOOD BLOOD RIGHT ARM  Final   Special Requests   Final    BOTTLES DRAWN AEROBIC AND ANAEROBIC Blood Culture results may not be optimal due to an inadequate volume of blood received  in culture bottles   Culture   Final    NO GROWTH < 24 HOURS Performed at [facility], [address] [place], [place] [address]    Report Status PENDING  Incomplete  Blood culture (single)     Status: None (Preliminary result)   Collection Time: 10/19/20 10:05 AM   Specimen: BLOOD  Result Value Ref Range Status   Specimen Description BLOOD BLOOD LEFT ARM  Final   Special Requests   Final    BOTTLES DRAWN AEROBIC AND ANAEROBIC Blood Culture adequate volume   Culture   Final    NO GROWTH < 24 HOURS Performed at [facility], [address], [place], [place] [address]    Report Status PENDING  Incomplete  Resp Panel by RT-PCR (Flu A&B, Covid) Nasopharyngeal Swab     Status: None   Collection Time: 10/19/20 12:13 PM   Specimen: Nasopharyngeal Swab; Nasopharyngeal(NP) swabs in vial transport medium  Result Value Ref Range Status   SARS Coronavirus 2 by RT PCR NEGATIVE NEGATIVE Final    Comment: (NOTE) SARS-CoV-2 target nucleic acids are NOT DETECTED.  The SARS-CoV-2 RNA is generally detectable in upper respiratory specimens  during the acute phase of infection. The lowest concentration of SARS-CoV-2 viral copies this assay can detect is 138 copies/mL. A negative result does not preclude SARS-Cov-2 infection and should not be used as the sole basis for treatment or other patient management decisions. A negative result may occur with  improper specimen collection/handling, submission of specimen other than nasopharyngeal swab, presence of viral mutation(s) within the areas targeted by this assay, and inadequate number of viral copies(<138 copies/mL). A negative result must be combined with clinical observations, patient history, and epidemiological information. The expected result is Negative.  Fact Sheet for Patients:  EntrepreneurPulse.com.au  Fact Sheet for Healthcare Providers:  IncredibleEmployment.be  This test is no t yet approved or cleared by the [place] FDA and  has been authorized for detection and/or diagnosis of SARS-CoV-2 by FDA under an Emergency Use Authorization (EUA). This EUA will remain  in effect (meaning this test can be used) for the duration of the COVID-19 declaration under Section 564(b)(1) of the Act, 21 U.S.C.section 360bbb-3(b)(1), unless the authorization is terminated  or revoked sooner.       Influenza A by PCR NEGATIVE NEGATIVE Final   Influenza B by PCR NEGATIVE NEGATIVE Final    Comment: (NOTE) The Xpert Xpress SARS-CoV-2/FLU/RSV plus assay is intended as an aid in the diagnosis of influenza from Nasopharyngeal swab specimens and should not be used as a sole basis for treatment. Nasal washings and aspirates are unacceptable for Xpert Xpress SARS-CoV-2/FLU/RSV testing.  Fact Sheet for Patients: EntrepreneurPulse.com.au  Fact Sheet for Healthcare Providers: IncredibleEmployment.be  This test is not yet approved or cleared by the [place] FDA and has been authorized for detection  and/or diagnosis of SARS-CoV-2 by FDA under an Emergency Use Authorization (EUA). This EUA will remain in effect (meaning this test can be used) for the duration of the COVID-19 declaration under Section 564(b)(1) of the Act, 21 U.S.C. section 360bbb-3(b)(1), unless the authorization is terminated or revoked.  Performed at [facility], [address] [place], [place] [address]      Labs: BNP (last 3 results) No results for input(s): BNP in the last 8760 hours. Basic Metabolic Panel: Recent Labs  Lab 10/19/20 0857 10/19/20 1202 10/20/20 0448  NA 124* 126* 134*  K 4.3 4.1 3.3*  CL 85* 91*  99  CO2 27 25 28   GLUCOSE 1,061* 724* 170*  BUN 10 8 8   CREATININE 0.92 0.72 0.51*  CALCIUM 8.8* 8.4* 8.6*   Liver Function Tests: Recent Labs  Lab 10/19/20 0857  AST 17  ALT 12  ALKPHOS 122  BILITOT 0.8  PROT 8.1  ALBUMIN 3.8   No results for input(s): LIPASE, AMYLASE in the last 168 hours. No results for input(s): AMMONIA in the last 168 hours. CBC: Recent Labs  Lab 10/19/20 0857 10/19/20 1202 10/20/20 0448  WBC 7.2 6.9 6.9  NEUTROABS 5.2 4.9 4.0  HGB 14.3 13.7 13.6  HCT 41.8 39.0 38.5*  MCV 87.8 85.2 85.0  PLT 199 186 152   Cardiac Enzymes: No results for input(s): CKTOTAL, CKMB, CKMBINDEX, TROPONINI in the last 168 hours. BNP: Invalid input(s): POCBNP CBG: Recent Labs  Lab 10/20/20 0445 10/20/20 0547 10/20/20 0642 10/20/20 0806 10/20/20 0925  GLUCAP 145* 179* 172* 158* 152*   D-Dimer No results for input(s): DDIMER in the last 72 hours. Hgb A1c No results for input(s): HGBA1C in the last 72 hours. Lipid Profile No results for input(s): CHOL, HDL, LDLCALC, TRIG, CHOLHDL, LDLDIRECT in the last 72 hours. Thyroid function studies No results for input(s): TSH, T4TOTAL, T3FREE, THYROIDAB in the last 72 hours.  Invalid input(s): FREET3 Anemia work up No results for input(s): VITAMINB12, FOLATE, FERRITIN, TIBC, IRON, RETICCTPCT in the last 72  hours. Urinalysis    Component Value Date/Time   COLORURINE STRAW (A) 10/19/2020 0958   APPEARANCEUR CLEAR (A) 10/19/2020 0958   APPEARANCEUR Cloudy 12/30/2012 1018   LABSPEC 1.030 10/19/2020 0958   LABSPEC 1.017 12/30/2012 1018   PHURINE 6.0 10/19/2020 0958   GLUCOSEU >=500 (A) 10/19/2020 0958   GLUCOSEU Negative 12/30/2012 1018   HGBUR NEGATIVE 10/19/2020 0958   BILIRUBINUR NEGATIVE 10/19/2020 0958   BILIRUBINUR Negative 12/30/2012 1018   KETONESUR NEGATIVE 10/19/2020 0958   PROTEINUR NEGATIVE 10/19/2020 0958   NITRITE NEGATIVE 10/19/2020 0958   LEUKOCYTESUR NEGATIVE 10/19/2020 0958   LEUKOCYTESUR Negative 12/30/2012 1018   Sepsis Labs Invalid input(s): PROCALCITONIN,  WBC,  LACTICIDVEN Microbiology Recent Results (from the past 240 hour(s))  Blood culture (single)     Status: None (Preliminary result)   Collection Time: 10/19/20  9:57 AM   Specimen: BLOOD  Result Value Ref Range Status   Specimen Description BLOOD BLOOD RIGHT ARM  Final   Special Requests   Final    BOTTLES DRAWN AEROBIC AND ANAEROBIC Blood Culture results may not be optimal due to an inadequate volume of blood received in culture bottles   Culture   Final    NO GROWTH < 24 HOURS Performed at [facility], [address] [place], [place] [address]    Report Status PENDING  Incomplete  Blood culture (single)     Status: None (Preliminary result)   Collection Time: 10/19/20 10:05 AM   Specimen: BLOOD  Result Value Ref Range Status   Specimen Description BLOOD BLOOD LEFT ARM  Final   Special Requests   Final    BOTTLES DRAWN AEROBIC AND ANAEROBIC Blood Culture adequate volume   Culture   Final    NO GROWTH < 24 HOURS Performed at [facility], [address] [place], [place] [address]    Report Status PENDING  Incomplete  Resp Panel by RT-PCR (Flu A&B, Covid) Nasopharyngeal Swab     Status: None   Collection Time: 10/19/20 12:13 PM   Specimen: Nasopharyngeal Swab;  Nasopharyngeal(NP) swabs in vial  transport medium  Result Value Ref Range Status   SARS Coronavirus 2 by RT PCR NEGATIVE NEGATIVE Final    Comment: (NOTE) SARS-CoV-2 target nucleic acids are NOT DETECTED.  The SARS-CoV-2 RNA is generally detectable in upper respiratory specimens during the acute phase of infection. The lowest concentration of SARS-CoV-2 viral copies this assay can detect is 138 copies/mL. A negative result does not preclude SARS-Cov-2 infection and should not be used as the sole basis for treatment or other patient management decisions. A negative result may occur with  improper specimen collection/handling, submission of specimen other than nasopharyngeal swab, presence of viral mutation(s) within the areas targeted by this assay, and inadequate number of viral copies(<138 copies/mL). A negative result must be combined with clinical observations, patient history, and epidemiological information. The expected result is Negative.  Fact Sheet for Patients:  EntrepreneurPulse.com.au  Fact Sheet for Healthcare Providers:  IncredibleEmployment.be  This test is no t yet approved or cleared by the [place] FDA and  has been authorized for detection and/or diagnosis of SARS-CoV-2 by FDA under an Emergency Use Authorization (EUA). This EUA will remain  in effect (meaning this test can be used) for the duration of the COVID-19 declaration under Section 564(b)(1) of the Act, 21 U.S.C.section 360bbb-3(b)(1), unless the authorization is terminated  or revoked sooner.       Influenza A by PCR NEGATIVE NEGATIVE Final   Influenza B by PCR NEGATIVE NEGATIVE Final    Comment: (NOTE) The Xpert Xpress SARS-CoV-2/FLU/RSV plus assay is intended as an aid in the diagnosis of influenza from Nasopharyngeal swab specimens and should not be used as a sole basis for treatment. Nasal washings and aspirates are unacceptable for Xpert Xpress  SARS-CoV-2/FLU/RSV testing.  Fact Sheet for Patients: EntrepreneurPulse.com.au  Fact Sheet for Healthcare Providers: IncredibleEmployment.be  This test is not yet approved or cleared by the [place] FDA and has been authorized for detection and/or diagnosis of SARS-CoV-2 by FDA under an Emergency Use Authorization (EUA). This EUA will remain in effect (meaning this test can be used) for the duration of the COVID-19 declaration under Section 564(b)(1) of the Act, 21 U.S.C. section 360bbb-3(b)(1), unless the authorization is terminated or revoked.  Performed at [facility], [address] [place], [place] [address]     Time coordinating discharge: Over 30 minutes  SIGNED:  [name], MD  [facility] 10/20/2020, 1:32 PM  If 7PM-7AM, please contact night-coverage [email or web address]  This record has been created using Systems analyst. Errors have been sought and corrected,but may not always be located. Such creation errors do not reflect on the standard of care.

## 2020-10-20 NOTE — ED Notes (Signed)
Discharge instructions reviewed with pt mother. Pt calm , collective .

## 2020-10-20 NOTE — ED Notes (Signed)
Per lab A1c was received

## 2020-10-20 NOTE — ED Notes (Signed)
Spoke with social work Artist regarding pt assistance and Risk analyst. She stated she was coming to speak with pt

## 2020-10-20 NOTE — ED Notes (Signed)
Social work spoke with pt and gave resources to pt.

## 2020-10-20 NOTE — ED Notes (Signed)
Message MD regarding blood sugar reading of 336

## 2020-10-20 NOTE — ED Notes (Signed)
Pt given meal tray.

## 2020-10-20 NOTE — ED Notes (Signed)
Message pharmacy regarding levemir , missing dose

## 2020-10-20 NOTE — ED Notes (Signed)
Per MD Sumayya , d/c Insulin

## 2020-10-20 NOTE — ED Notes (Signed)
Pt calm , collective.   Gave pt and spouse something to drink

## 2020-10-20 NOTE — ED Notes (Signed)
Message MD. Tilman Neat regarding recent blood sugar and anion Gap

## 2020-10-20 NOTE — ED Notes (Signed)
Pt calm , collective , denied pain or sob. Pt ambulatory upon discharge. Called pt ride, pt in the lobby waiting for his ride .

## 2020-10-20 NOTE — TOC Transition Note (Signed)
Transition of Care [facility]) - Progression Note    Patient Details  Name: Earl Moreno MRN: [id number] Date of Birth: 12-22-1965  [facility] CM/SW Contact  Marina Goodell Phone Number:  [phone] 10/20/2020, 2:20 PM  Clinical Narrative:     CSW spoke with patient who stated his girlfriend would be abel to pick him up and take him to medication Management to get his medications.  CSW gave patient packet of [facility] as well as substance abuse program resources.  Patient stated he will fill out [facility] application and make appointment.  CSW stated I would send referral to [facility]. Patient stated he had no other needs.  CSW updated Attending/ED RN.        Expected Discharge Plan and Services           Expected Discharge Date: 10/20/20                                     Social Determinants of Health (SDOH) Interventions    Readmission Risk Interventions Readmission Risk Prevention Plan 06/07/2018  Post Dischage Appt Complete  Medication Screening Complete  Transportation Screening Complete  PCP follow-up Complete  Some recent data might be hidden

## 2020-10-20 NOTE — ED Notes (Signed)
Per MD. Tilman Neat.  Give Levemir and stop drip 1hr after.

## 2020-10-21 ENCOUNTER — Other Ambulatory Visit: Payer: Self-pay

## 2020-10-22 LAB — HEMOGLOBIN A1C
Hgb A1c MFr Bld: 14.4 % — ABNORMAL HIGH (ref 4.8–5.6)
Mean Plasma Glucose: 367 mg/dL

## 2020-10-24 LAB — CULTURE, BLOOD (SINGLE)
Culture: NO GROWTH
Culture: NO GROWTH
Special Requests: ADEQUATE

## 2020-11-15 ENCOUNTER — Other Ambulatory Visit: Payer: Self-pay

## 2020-12-09 ENCOUNTER — Other Ambulatory Visit: Payer: Self-pay

## 2021-02-26 ENCOUNTER — Other Ambulatory Visit: Payer: Self-pay

## 2021-02-26 ENCOUNTER — Emergency Department (HOSPITAL_COMMUNITY)
Admission: EM | Admit: 2021-02-26 | Discharge: 2021-02-26 | Disposition: A | Discharge status: Home / Self Care | Payer: Self-pay | Attending: Emergency Medicine | Admitting: Emergency Medicine

## 2021-02-26 ENCOUNTER — Encounter (HOSPITAL_COMMUNITY): Payer: Self-pay | Admitting: *Deleted

## 2021-02-26 DIAGNOSIS — I10 Essential (primary) hypertension: Secondary | ICD-10-CM | POA: Insufficient documentation

## 2021-02-26 DIAGNOSIS — L0291 Cutaneous abscess, unspecified: Secondary | ICD-10-CM

## 2021-02-26 DIAGNOSIS — F17210 Nicotine dependence, cigarettes, uncomplicated: Secondary | ICD-10-CM | POA: Insufficient documentation

## 2021-02-26 DIAGNOSIS — E1165 Type 2 diabetes mellitus with hyperglycemia: Secondary | ICD-10-CM | POA: Insufficient documentation

## 2021-02-26 DIAGNOSIS — R739 Hyperglycemia, unspecified: Secondary | ICD-10-CM

## 2021-02-26 DIAGNOSIS — L02413 Cutaneous abscess of right upper limb: Secondary | ICD-10-CM | POA: Insufficient documentation

## 2021-02-26 DIAGNOSIS — Z79899 Other long term (current) drug therapy: Secondary | ICD-10-CM | POA: Insufficient documentation

## 2021-02-26 DIAGNOSIS — Z794 Long term (current) use of insulin: Secondary | ICD-10-CM | POA: Insufficient documentation

## 2021-02-26 LAB — CBC WITH DIFFERENTIAL/PLATELET
Abs Immature Granulocytes: 0.03 10*3/uL (ref 0.00–0.07)
Basophils Absolute: 0 10*3/uL (ref 0.0–0.1)
Basophils Relative: 1 %
Eosinophils Absolute: 0.1 10*3/uL (ref 0.0–0.5)
Eosinophils Relative: 1 %
HCT: 43 % (ref 39.0–52.0)
Hemoglobin: 14.4 g/dL (ref 13.0–17.0)
Immature Granulocytes: 0 %
Lymphocytes Relative: 14 %
Lymphs Abs: 1.1 10*3/uL (ref 0.7–4.0)
MCH: 30.6 pg (ref 26.0–34.0)
MCHC: 33.5 g/dL (ref 30.0–36.0)
MCV: 91.3 fL (ref 80.0–100.0)
Monocytes Absolute: 0.4 10*3/uL (ref 0.1–1.0)
Monocytes Relative: 5 %
Neutro Abs: 5.9 10*3/uL (ref 1.7–7.7)
Neutrophils Relative %: 79 %
Platelets: 201 10*3/uL (ref 150–400)
RBC: 4.71 MIL/uL (ref 4.22–5.81)
RDW: 12.7 % (ref 11.5–15.5)
WBC: 7.5 10*3/uL (ref 4.0–10.5)
nRBC: 0 % (ref 0.0–0.2)

## 2021-02-26 LAB — COMPREHENSIVE METABOLIC PANEL
ALT: 31 U/L (ref 0–44)
AST: 22 U/L (ref 15–41)
Albumin: 3 g/dL — ABNORMAL LOW (ref 3.5–5.0)
Alkaline Phosphatase: 132 U/L — ABNORMAL HIGH (ref 38–126)
Anion gap: 7 (ref 5–15)
BUN: 8 mg/dL (ref 6–20)
CO2: 28 mmol/L (ref 22–32)
Calcium: 8.8 mg/dL — ABNORMAL LOW (ref 8.9–10.3)
Chloride: 92 mmol/L — ABNORMAL LOW (ref 98–111)
Creatinine, Ser: 0.63 mg/dL (ref 0.61–1.24)
GFR, Estimated: >60 mL/min (ref >60)
Glucose, Bld: 509 mg/dL (ref 70–99)
Potassium: 4.1 mmol/L (ref 3.5–5.1)
Sodium: 127 mmol/L — ABNORMAL LOW (ref 135–145)
Total Bilirubin: 0.8 mg/dL (ref 0.3–1.2)
Total Protein: 7.9 g/dL (ref 6.5–8.1)

## 2021-02-26 LAB — CBG MONITORING, ED
Glucose-Capillary: 472 mg/dL — ABNORMAL HIGH (ref 70–99)
Glucose-Capillary: 456 mg/dL — ABNORMAL HIGH (ref 70–99)

## 2021-02-26 LAB — LACTIC ACID, PLASMA: Lactic Acid, Venous: 1.6 mmol/L (ref 0.5–1.9)

## 2021-02-26 MED ORDER — HYDROCODONE-ACETAMINOPHEN 5-325 MG PO TABS
2.0000 | ORAL_TABLET | Freq: Once | ORAL | Status: AC
  Administered 2021-02-26: 2 via ORAL
  Filled 2021-02-26: qty 2

## 2021-02-26 MED ORDER — INSULIN LISPRO PROT & LISPRO (75-25 MIX) 100 UNIT/ML KWIKPEN: 30.0000 [IU] | PEN_INJECTOR | Freq: Two times a day (BID) | SUBCUTANEOUS | 11 refills | Status: DC

## 2021-02-26 MED ORDER — RIGHTEST GS550 BLOOD GLUCOSE VI STRP: ORAL_STRIP | 99 refills | Status: DC

## 2021-02-26 MED ORDER — SULFAMETHOXAZOLE-TRIMETHOPRIM 800-160 MG PO TABS: 1.0000 | ORAL_TABLET | Freq: Two times a day (BID) | ORAL | 0 refills | Status: AC

## 2021-02-26 MED ORDER — RIGHTEST GL300 LANCETS MISC: 99 refills | Status: DC

## 2021-02-26 MED ORDER — BLOOD GLUCOSE MONITOR SYSTEM W/DEVICE KIT: PACK | 0 refills | Status: DC

## 2021-02-26 MED ORDER — INSULIN ASPART 100 UNIT/ML IJ SOLN
10.0000 [IU] | Freq: Once | INTRAMUSCULAR | Status: AC
  Administered 2021-02-26: 10 [IU] via SUBCUTANEOUS

## 2021-02-26 MED ORDER — AMLODIPINE BESYLATE 10 MG PO TABS: 10.0000 mg | ORAL_TABLET | Freq: Every day | ORAL | 1 refills | Status: DC

## 2021-02-26 MED ORDER — COMFORT EZ PEN NEEDLES 32G X 4 MM MISC: 1.0000 | Freq: Two times a day (BID) | 1 refills | Status: DC

## 2021-02-26 NOTE — ED Provider Notes (Signed)
, [name] Provider Note   CSN: [id number] Arrival date & time: 02/26/21  1517     History Chief Complaint  Patient presents with   Abscess    Earl Moreno is a 55 y.o. male.  HPI This is a 55 year old male with history of diabetes hypertension recurrent skin infections and IV drug use who presents with concern for abscess.  Patient reports last IV drug use was this morning.  Has known history of diabetes supposed to be on insulin daily but does not use for several months as he is out of the medication.  Reports several weeks of abscesses to the upper extremities.  Also has a draining and open wound to the right lower calf.  Denies any fever, chills, vomiting, or other systemic infectious symptoms.    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[place]) 02/09/2020   Hyponatremia 02/09/2020   Essential hypertension 02/09/2020   Cellulitis of left hand 06/02/2018    Past Surgical History:  Procedure Laterality Date   I & D EXTREMITY Left 06/05/2018   Procedure: IRRIGATION AND DEBRIDEMENT EXTREMITY-LEFT HAND;  Surgeon: Lovell Sheehan, MD;  Location: [facility] ORS;  Service: Orthopedics;  Laterality: Left;       Family History  Problem Relation Age of Onset   Diabetes Mother     Social History   Tobacco Use   Smoking status: Every Day    Packs/day: 0.50    Types: Cigarettes   Smokeless tobacco: Never  Substance Use Topics   Alcohol use: No   Drug use: Yes     Types: Cocaine, IV    Comment: Heroin    Home Medications Prior to Admission medications   Medication Sig Start Date End Date Taking? Authorizing Provider  sulfamethoxazole-trimethoprim (BACTRIM DS) 800-160 MG tablet Take 1 tablet by mouth 2 (two) times daily for 7 days. 02/26/21 03/05/21 Yes [name], MD  amLODipine (NORVASC) 10 MG tablet Take 1 tablet (10 mg total) by mouth daily. 02/26/21   [name], MD  Blood Glucose Monitoring Suppl (BLOOD GLUCOSE MONITOR SYSTEM) w/Device KIT USE UP TO 4 TIMES DAILY AS DIRECTED. 02/26/21   [name], MD  DULoxetine (CYMBALTA) 20 MG capsule Take 1 capsule (20 mg total) by mouth daily. 10/21/20   [name], MD  Ensure Max Protein (ENSURE MAX PROTEIN) LIQD Take 330 mLs (11 oz total) by mouth 2 (two) times daily between meals. 02/14/20   [name], [name], DO  glucose blood (RIGHTEST [phone] BLOOD GLUCOSE) test strip USE AS DIRECTED UP TO 4 TIMES DAILY. 02/26/21   [name], MD  Insulin Lispro Prot & Lispro (HUMALOG MIX 75/25 KWIKPEN) (75-25) 100 UNIT/ML Kwikpen Inject 30 Units into the skin 2 (two) times daily. 02/26/21   [name], MD  Insulin Pen Needle (COMFORT EZ PEN NEEDLES) 32G X 4 MM MISC USE AS DIRECTED TWICE DAILY (MORNING AND BEDTIME) 02/26/21   [name], MD  Multiple Vitamin (MULTIVITAMIN  WITH MINERALS) TABS tablet Take 1 tablet by mouth daily. 10/20/20   [name], MD  nicotine (NICODERM CQ - DOSED IN MG/24 HOURS) 14 mg/24hr patch Place 1 patch (14 mg total) onto the skin daily. 10/21/20   [name], MD  Rightest GL300 Lancets MISC USE AS DIRECTED UP TO 4 TIMES DAILY. 02/26/21   [name], MD    Allergies    Patient has no known allergies.  Review of Systems   Review of Systems  Constitutional:  Negative for chills and fever.  HENT:  Negative for ear pain and sore throat.   Eyes:  Negative for pain and visual disturbance.  Respiratory:  Negative for cough and shortness of breath.    Cardiovascular:  Negative for chest pain and palpitations.  Gastrointestinal:  Negative for abdominal pain and vomiting.  Genitourinary:  Negative for dysuria and hematuria.  Musculoskeletal:  Negative for arthralgias and back pain.  Skin:  Positive for wound. Negative for color change and rash.  Neurological:  Negative for seizures and syncope.  All other systems reviewed and are negative.  Physical Exam Updated Vital Signs BP 99/67   Pulse 73   Temp 98.3 F (36.8 C) (Oral)   Resp 18   Ht _0  (1.727 m)   Wt 59 kg   SpO2 95%   BMI 19.78 kg/m   Physical Exam Vitals and nursing note reviewed.  Constitutional:      Appearance: He is well-developed.  HENT:     Head: Normocephalic and atraumatic.  Eyes:     Conjunctiva/sclera: Conjunctivae normal.  Cardiovascular:     Rate and Rhythm: Normal rate and regular rhythm.     Heart sounds: No murmur heard. Pulmonary:     Effort: Pulmonary effort is normal. No respiratory distress.     Breath sounds: Normal breath sounds.  Abdominal:     Palpations: Abdomen is soft.     Tenderness: There is no abdominal tenderness.  Musculoskeletal:     Cervical back: Neck supple.  Skin:    General: Skin is warm and dry.     Comments: Multiple erythematous nodules on UE. RLE with 3cmx4cm ulcerated lesion on Lateral calf without purulent drainage but with surrounding erythema.   Neurological:     Mental Status: He is alert.    ED Results / Procedures / Treatments   Labs (all labs ordered are listed, but only abnormal results are displayed) Labs Reviewed  COMPREHENSIVE METABOLIC PANEL - Abnormal; Notable for the following components:      Result Value   Sodium 127 (*)    Chloride 92 (*)    Glucose, Bld 509 (*)    Calcium 8.8 (*)    Albumin 3.0 (*)    Alkaline Phosphatase 132 (*)    All other components within normal limits  CBG MONITORING, ED - Abnormal; Notable for the following components:   Glucose-Capillary 472 (*)    All other  components within normal limits  CBG MONITORING, ED - Abnormal; Notable for the following components:   Glucose-Capillary 456 (*)    All other components within normal limits  CULTURE, BLOOD (ROUTINE X 2)  CULTURE, BLOOD (ROUTINE X 2)  CBC WITH DIFFERENTIAL/PLATELET  LACTIC ACID, PLASMA    EKG None  Radiology No results found.  Procedures Procedures   Medications Ordered in ED Medications  HYDROcodone-acetaminophen (NORCO/VICODIN) 5-325 MG per tablet 2 tablet (2 tablets Oral Given 02/26/21 1943)  insulin aspart (novoLOG) injection 10 Units (10 Units Subcutaneous  Given 02/26/21 1953)    ED Course  I have reviewed the triage vital signs and the nursing notes.  Pertinent labs & imaging results that were available during my care of the patient were reviewed by me and considered in my medical decision making (see chart for details).    MDM Rules/Calculators/A&P                          Patient is hemodynamically stable and well-appearing on exam.  He is afebrile without tachycardia or hypotension.  Has multiple erythematous nodules on the upper extremities, but no fluctuant areas amenable to drainage.  Has a 4 cm x 3 cm oval area of ulceration on the lateral aspect of the right calf.  No purulent drainage but there is surrounding erythema and granulation tissue.  Wounds and abscesses likely secondary to IV drug use and poor glycemic control.  Patient has a normal white blood cell count and normal lactic acid.  CMP unremarkable.  Blood cultures collected in triage, below suspicion for systemic infection or sepsis.  Given 10 units of insulin for blood glucose of 450.  Refilled patient's home medications.  Educated on the importance of glucose control in the setting of wound care.  Discharged home with Bactrim.  Referral placed for patient to obtain a PCP.  Discharged home in stable condition.  Given strict return precautions and patient expressed understanding  Final Clinical Impression(s)  / ED Diagnoses Final diagnoses:  Abscess  Hyperglycemia    Rx / DC Orders ED Discharge Orders          Ordered    sulfamethoxazole-trimethoprim (BACTRIM DS) 800-160 MG tablet  2 times daily        02/26/21 2151    amLODipine (NORVASC) 10 MG tablet  Daily        02/26/21 2151    Insulin Lispro Prot & Lispro (HUMALOG MIX 75/25 KWIKPEN) (75-25) 100 UNIT/ML Kwikpen  2 times daily        02/26/21 2151    Insulin Pen Needle (COMFORT EZ PEN NEEDLES) 32G X 4 MM MISC  2 times daily        02/26/21 2151    Rightest GL300 Lancets MISC        02/26/21 2151    Blood Glucose Monitoring Suppl (BLOOD GLUCOSE MONITOR SYSTEM) w/Device KIT        02/26/21 2151    glucose blood (RIGHTEST GS550 BLOOD GLUCOSE) test strip        02/26/21 2151             [name], MD 02/26/21 2330    Isla Pence, MD 02/26/21 2332

## 2021-02-26 NOTE — ED Notes (Signed)
E-signature pad unavailable at time of pt discharge. This RN discussed discharge materials with pt and answered all pt questions. Pt stated understanding of discharge material. ? ?

## 2021-02-26 NOTE — ED Notes (Signed)
Glucose 509-Brittany, PA @ triage notified.

## 2021-02-26 NOTE — ED Triage Notes (Signed)
The pt is a diabetic and he has not had any med for diabetes in approx one year. He has had numerus abscesses over his body worse for the past 2 months   no doctor  no temp  lt leg large abscess 2 on his t arm

## 2021-02-26 NOTE — Discharge Instructions (Signed)
Take bactrim twice daily for 7 days. Follow up with your primary care doctor. Use insulin and other medications as prescribed. Your skin lesions will heal better with better glucose control. Keep wounds clean with soap and water, change dressings twice daily. Return to the ED with new or worsening symptoms including fever.

## 2021-02-26 NOTE — ED Provider Notes (Signed)
Emergency Medicine Provider Triage Evaluation Note  Earl Moreno , a 55 y.o. male  was evaluated in triage.  Pt complains of wounds, pain.  Family member at the bedside providing most of the history, noncompliant with medications.  Reports his blood sugar has been over thousand in the past.  Is having increased pain.  He is IV drug user, last used today.  However also reports significant weight loss in the last couple of months.  Review of Systems  Positive: Wound, chills Negative: Back pain, shortness of breath,ches tpain  Physical Exam  There were no vitals taken for this visit. Gen:   Awake, no distress   Resp:  Normal effort  MSK:   Moves extremities without difficulty  Other:  Cachectic, ill-appearing, multiple wounds throughout upper extremities, left lower extremity.  Medical Decision Making  Medically screening exam initiated at 3:38 PM.  Appropriate orders placed.  Kellie Simmering was informed that the remainder of the evaluation will be completed by another provider, this initial triage assessment does not replace that evaluation, and the importance of remaining in the ED until their evaluation is complete.  IV drug use, diabetes here for noncompliance along with wound multiple along with pain.   Claude Manges, PA-C 02/26/21 1543    Gerhard Munch, MD 02/26/21 1606

## 2021-02-26 NOTE — ED Notes (Signed)
Pt stated to this RN and Dr. Particia Nearing that if he does not get something to eat, then he will leave the facility. Sandwich given to pt by Dr. Particia Nearing.

## 2021-03-03 LAB — CULTURE, BLOOD (ROUTINE X 2)
Culture: NO GROWTH
Special Requests: ADEQUATE

## 2021-11-28 ENCOUNTER — Other Ambulatory Visit: Payer: Self-pay

## 2021-11-28 DIAGNOSIS — Z5900 Homelessness unspecified: Secondary | ICD-10-CM

## 2021-11-28 DIAGNOSIS — Z6822 Body mass index (BMI) 22.0-22.9, adult: Secondary | ICD-10-CM

## 2021-11-28 DIAGNOSIS — N200 Calculus of kidney: Secondary | ICD-10-CM | POA: Diagnosis present

## 2021-11-28 DIAGNOSIS — E1165 Type 2 diabetes mellitus with hyperglycemia: Secondary | ICD-10-CM | POA: Diagnosis present

## 2021-11-28 DIAGNOSIS — I10 Essential (primary) hypertension: Secondary | ICD-10-CM | POA: Diagnosis present

## 2021-11-28 DIAGNOSIS — Z833 Family history of diabetes mellitus: Secondary | ICD-10-CM

## 2021-11-28 DIAGNOSIS — E876 Hypokalemia: Secondary | ICD-10-CM | POA: Diagnosis present

## 2021-11-28 DIAGNOSIS — N10 Acute tubulo-interstitial nephritis: Secondary | ICD-10-CM | POA: Diagnosis present

## 2021-11-28 DIAGNOSIS — E43 Unspecified severe protein-calorie malnutrition: Secondary | ICD-10-CM | POA: Diagnosis present

## 2021-11-28 DIAGNOSIS — Z79899 Other long term (current) drug therapy: Secondary | ICD-10-CM

## 2021-11-28 DIAGNOSIS — R64 Cachexia: Secondary | ICD-10-CM | POA: Diagnosis present

## 2021-11-28 DIAGNOSIS — A4181 Sepsis due to Enterococcus: Principal | ICD-10-CM | POA: Diagnosis present

## 2021-11-28 DIAGNOSIS — E871 Hypo-osmolality and hyponatremia: Secondary | ICD-10-CM | POA: Diagnosis present

## 2021-11-28 DIAGNOSIS — F1120 Opioid dependence, uncomplicated: Secondary | ICD-10-CM | POA: Diagnosis present

## 2021-11-28 DIAGNOSIS — Z794 Long term (current) use of insulin: Secondary | ICD-10-CM

## 2021-11-28 DIAGNOSIS — F17210 Nicotine dependence, cigarettes, uncomplicated: Secondary | ICD-10-CM | POA: Diagnosis present

## 2021-11-28 LAB — CBC WITH DIFFERENTIAL/PLATELET
Abs Immature Granulocytes: 0.09 10*3/uL — ABNORMAL HIGH (ref 0.00–0.07)
Basophils Absolute: 0.1 10*3/uL (ref 0.0–0.1)
Basophils Relative: 0 %
Eosinophils Absolute: 0.1 10*3/uL (ref 0.0–0.5)
Eosinophils Relative: 1 %
HCT: 39.8 % (ref 39.0–52.0)
Hemoglobin: 13.9 g/dL (ref 13.0–17.0)
Immature Granulocytes: 1 %
Lymphocytes Relative: 8 %
Lymphs Abs: 1.3 10*3/uL (ref 0.7–4.0)
MCH: 30.3 pg (ref 26.0–34.0)
MCHC: 34.9 g/dL (ref 30.0–36.0)
MCV: 86.7 fL (ref 80.0–100.0)
Monocytes Absolute: 0.8 10*3/uL (ref 0.1–1.0)
Monocytes Relative: 5 %
Neutro Abs: 14.8 10*3/uL — ABNORMAL HIGH (ref 1.7–7.7)
Neutrophils Relative %: 85 %
Platelets: 237 10*3/uL (ref 150–400)
RBC: 4.59 MIL/uL (ref 4.22–5.81)
RDW: 12.3 % (ref 11.5–15.5)
WBC: 17.2 10*3/uL — ABNORMAL HIGH (ref 4.0–10.5)
nRBC: 0 % (ref 0.0–0.2)

## 2021-11-28 LAB — COMPREHENSIVE METABOLIC PANEL
ALT: 18 U/L (ref 0–44)
AST: 16 U/L (ref 15–41)
Albumin: 2.9 g/dL — ABNORMAL LOW (ref 3.5–5.0)
Alkaline Phosphatase: 183 U/L — ABNORMAL HIGH (ref 38–126)
Anion gap: 12 (ref 5–15)
BUN: 14 mg/dL (ref 6–20)
CO2: 28 mmol/L (ref 22–32)
Calcium: 8.7 mg/dL — ABNORMAL LOW (ref 8.9–10.3)
Chloride: 83 mmol/L — ABNORMAL LOW (ref 98–111)
Creatinine, Ser: 0.67 mg/dL (ref 0.61–1.24)
GFR, Estimated: >60 mL/min (ref >60)
Glucose, Bld: 426 mg/dL — ABNORMAL HIGH (ref 70–99)
Potassium: 3.7 mmol/L (ref 3.5–5.1)
Sodium: 123 mmol/L — ABNORMAL LOW (ref 135–145)
Total Bilirubin: 0.9 mg/dL (ref 0.3–1.2)
Total Protein: 8.4 g/dL — ABNORMAL HIGH (ref 6.5–8.1)

## 2021-11-28 NOTE — ED Triage Notes (Signed)
Pt presents via POV c/o right sided flank pain. Pt also report hematuria and dysuria. Pt also reports has hx of IV drug abuse, with multiple skin infection that he is "taking care of" himself as OP. Reports 100lbs weight loss since last year.

## 2021-11-29 ENCOUNTER — Emergency Department: Payer: Medicaid Other

## 2021-11-29 ENCOUNTER — Inpatient Hospital Stay (HOSPITAL_COMMUNITY)
Admit: 2021-11-29 | Discharge: 2021-11-29 | Disposition: A | Discharge status: Home / Self Care | Payer: Medicaid Other | Attending: Internal Medicine | Admitting: Internal Medicine

## 2021-11-29 ENCOUNTER — Inpatient Hospital Stay
Admission: EM | Admit: 2021-11-29 | Discharge: 2021-12-02 | DRG: 871 | Disposition: A | Discharge status: Home / Self Care | Payer: Medicaid Other | Attending: Internal Medicine | Admitting: Internal Medicine

## 2021-11-29 DIAGNOSIS — Z5900 Homelessness unspecified: Secondary | ICD-10-CM | POA: Diagnosis not present

## 2021-11-29 DIAGNOSIS — R7881 Bacteremia: Secondary | ICD-10-CM

## 2021-11-29 DIAGNOSIS — Z794 Long term (current) use of insulin: Secondary | ICD-10-CM | POA: Diagnosis not present

## 2021-11-29 DIAGNOSIS — E1165 Type 2 diabetes mellitus with hyperglycemia: Secondary | ICD-10-CM | POA: Diagnosis present

## 2021-11-29 DIAGNOSIS — F1120 Opioid dependence, uncomplicated: Secondary | ICD-10-CM | POA: Diagnosis present

## 2021-11-29 DIAGNOSIS — F1910 Other psychoactive substance abuse, uncomplicated: Secondary | ICD-10-CM | POA: Diagnosis present

## 2021-11-29 DIAGNOSIS — Z6822 Body mass index (BMI) 22.0-22.9, adult: Secondary | ICD-10-CM | POA: Diagnosis not present

## 2021-11-29 DIAGNOSIS — N10 Acute tubulo-interstitial nephritis: Secondary | ICD-10-CM | POA: Diagnosis present

## 2021-11-29 DIAGNOSIS — A4181 Sepsis due to Enterococcus: Secondary | ICD-10-CM | POA: Diagnosis present

## 2021-11-29 DIAGNOSIS — E876 Hypokalemia: Secondary | ICD-10-CM | POA: Diagnosis present

## 2021-11-29 DIAGNOSIS — F1920 Other psychoactive substance dependence, uncomplicated: Secondary | ICD-10-CM | POA: Diagnosis present

## 2021-11-29 DIAGNOSIS — F17210 Nicotine dependence, cigarettes, uncomplicated: Secondary | ICD-10-CM | POA: Diagnosis present

## 2021-11-29 DIAGNOSIS — N200 Calculus of kidney: Secondary | ICD-10-CM | POA: Diagnosis present

## 2021-11-29 DIAGNOSIS — R64 Cachexia: Secondary | ICD-10-CM | POA: Diagnosis present

## 2021-11-29 DIAGNOSIS — Z833 Family history of diabetes mellitus: Secondary | ICD-10-CM | POA: Diagnosis not present

## 2021-11-29 DIAGNOSIS — I10 Essential (primary) hypertension: Secondary | ICD-10-CM | POA: Diagnosis present

## 2021-11-29 DIAGNOSIS — E871 Hypo-osmolality and hyponatremia: Secondary | ICD-10-CM | POA: Diagnosis present

## 2021-11-29 DIAGNOSIS — E43 Unspecified severe protein-calorie malnutrition: Secondary | ICD-10-CM | POA: Diagnosis present

## 2021-11-29 DIAGNOSIS — Z79899 Other long term (current) drug therapy: Secondary | ICD-10-CM | POA: Diagnosis not present

## 2021-11-29 DIAGNOSIS — A419 Sepsis, unspecified organism: Secondary | ICD-10-CM

## 2021-11-29 DIAGNOSIS — N12 Tubulo-interstitial nephritis, not specified as acute or chronic: Principal | ICD-10-CM

## 2021-11-29 LAB — URINE DRUG SCREEN, QUALITATIVE (ARMC ONLY)
Amphetamines, Ur Screen: NOT DETECTED
Barbiturates, Ur Screen: NOT DETECTED
Benzodiazepine, Ur Scrn: NOT DETECTED
Cannabinoid 50 Ng, Ur ~~LOC~~: NOT DETECTED
Cocaine Metabolite,Ur ~~LOC~~: POSITIVE — AB
MDMA (Ecstasy)Ur Screen: NOT DETECTED
Methadone Scn, Ur: NOT DETECTED
Opiate, Ur Screen: NOT DETECTED
Phencyclidine (PCP) Ur S: NOT DETECTED
Tricyclic, Ur Screen: NOT DETECTED

## 2021-11-29 LAB — BLOOD CULTURE ID PANEL (REFLEXED) - BCID2

## 2021-11-29 LAB — ECHOCARDIOGRAM COMPLETE
AR max vel: 1.89 cm2
AV Area VTI: 2.45 cm2
AV Area mean vel: 1.81 cm2
AV Mean grad: 2 mmHg
AV Peak grad: 3.7 mmHg
Ao pk vel: 0.97 m/s
Area-P 1/2: 2.34 cm2
Height: 60 in
S' Lateral: 2.64 cm
Weight: 1851.2 oz

## 2021-11-29 LAB — URINALYSIS, ROUTINE W REFLEX MICROSCOPIC
Bilirubin Urine: NEGATIVE
Glucose, UA: >=500 mg/dL — AB
Ketones, ur: 5 mg/dL — AB
Nitrite: NEGATIVE
Protein, ur: 30 mg/dL — AB
Specific Gravity, Urine: 1.024 (ref 1.005–1.030)
Squamous Epithelial / HPF: NONE SEEN (ref 0–5)
pH: 6 (ref 5.0–8.0)

## 2021-11-29 LAB — SURGICAL PCR SCREEN
MRSA, PCR: NEGATIVE
Staphylococcus aureus: NEGATIVE

## 2021-11-29 LAB — LACTIC ACID, PLASMA
Lactic Acid, Venous: 1.6 mmol/L (ref 0.5–1.9)
Lactic Acid, Venous: 2.5 mmol/L (ref 0.5–1.9)
Lactic Acid, Venous: 2.1 mmol/L (ref 0.5–1.9)
Lactic Acid, Venous: 1.7 mmol/L (ref 0.5–1.9)

## 2021-11-29 LAB — HEMOGLOBIN A1C
Hgb A1c MFr Bld: 13.2 % — ABNORMAL HIGH (ref 4.8–5.6)
Mean Plasma Glucose: 332.14 mg/dL

## 2021-11-29 LAB — GLUCOSE, CAPILLARY
Glucose-Capillary: 476 mg/dL — ABNORMAL HIGH (ref 70–99)
Glucose-Capillary: 328 mg/dL — ABNORMAL HIGH (ref 70–99)
Glucose-Capillary: 261 mg/dL — ABNORMAL HIGH (ref 70–99)

## 2021-11-29 LAB — HIV ANTIBODY (ROUTINE TESTING W REFLEX): HIV Screen 4th Generation wRfx: NONREACTIVE

## 2021-11-29 LAB — PROCALCITONIN: Procalcitonin: 1.25 ng/mL

## 2021-11-29 MED ORDER — ADULT MULTIVITAMIN W/MINERALS CH
1.0000 | ORAL_TABLET | Freq: Every day | ORAL | Status: DC
  Administered 2021-11-29 – 2021-12-02 (×4): 1 via ORAL
  Filled 2021-11-29 (×4): qty 1

## 2021-11-29 MED ORDER — VANCOMYCIN HCL IN DEXTROSE 1-5 GM/200ML-% IV SOLN
1000.0000 mg | Freq: Once | INTRAVENOUS | Status: AC
  Administered 2021-11-29: 1000 mg via INTRAVENOUS
  Filled 2021-11-29: qty 200

## 2021-11-29 MED ORDER — VANCOMYCIN HCL 1250 MG/250ML IV SOLN
1250.0000 mg | INTRAVENOUS | Status: DC
  Filled 2021-11-29: qty 250

## 2021-11-29 MED ORDER — ONDANSETRON HCL 4 MG PO TABS: 4.0000 mg | ORAL_TABLET | Freq: Four times a day (QID) | ORAL | Status: DC | PRN

## 2021-11-29 MED ORDER — SODIUM CHLORIDE 0.9 % IV SOLN
2.0000 g | Freq: Three times a day (TID) | INTRAVENOUS | Status: AC
  Administered 2021-11-29 – 2021-12-01 (×8): 2 g via INTRAVENOUS
  Filled 2021-11-29 (×3): qty 12.5
  Filled 2021-11-29: qty 2
  Filled 2021-11-29: qty 12.5
  Filled 2021-11-29: qty 2
  Filled 2021-11-29 (×2): qty 12.5

## 2021-11-29 MED ORDER — ENOXAPARIN SODIUM 40 MG/0.4ML IJ SOSY
40.0000 mg | PREFILLED_SYRINGE | INTRAMUSCULAR | Status: DC
  Administered 2021-11-30 – 2021-12-01 (×2): 40 mg via SUBCUTANEOUS
  Filled 2021-11-29 (×2): qty 0.4

## 2021-11-29 MED ORDER — INSULIN DETEMIR 100 UNIT/ML ~~LOC~~ SOLN
15.0000 [IU] | Freq: Every day | SUBCUTANEOUS | Status: DC
Start: 2021-11-29 — End: 2021-12-01
  Administered 2021-11-29 – 2021-11-30 (×2): 15 [IU] via SUBCUTANEOUS
  Filled 2021-11-29 (×4): qty 0.15

## 2021-11-29 MED ORDER — LACTATED RINGERS IV BOLUS
1000.0000 mL | Freq: Once | INTRAVENOUS | Status: AC
  Administered 2021-11-29: 1000 mL via INTRAVENOUS

## 2021-11-29 MED ORDER — MORPHINE SULFATE (PF) 2 MG/ML IV SOLN
2.0000 mg | INTRAVENOUS | Status: DC | PRN
  Administered 2021-11-29 – 2021-11-30 (×5): 2 mg via INTRAVENOUS
  Filled 2021-11-29 (×5): qty 1

## 2021-11-29 MED ORDER — LACTATED RINGERS IV SOLN: INTRAVENOUS | Status: AC

## 2021-11-29 MED ORDER — SODIUM CHLORIDE 0.9 % IV SOLN
2.0000 g | Freq: Once | INTRAVENOUS | Status: AC
  Administered 2021-11-29: 2 g via INTRAVENOUS
  Filled 2021-11-29: qty 12.5

## 2021-11-29 MED ORDER — SODIUM CHLORIDE 0.9 % IV SOLN: INTRAVENOUS | Status: DC

## 2021-11-29 MED ORDER — VANCOMYCIN HCL 1250 MG/250ML IV SOLN: 1250.0000 mg | INTRAVENOUS | Status: DC

## 2021-11-29 MED ORDER — SODIUM CHLORIDE 0.9 % IV SOLN: 2.0000 g | INTRAVENOUS | Status: DC

## 2021-11-29 MED ORDER — ONDANSETRON HCL 4 MG/2ML IJ SOLN: 4.0000 mg | Freq: Four times a day (QID) | INTRAMUSCULAR | Status: DC | PRN

## 2021-11-29 MED ORDER — KETAMINE HCL 50 MG/5ML IJ SOSY
0.3000 mg/kg | PREFILLED_SYRINGE | Freq: Once | INTRAMUSCULAR | Status: AC
  Administered 2021-11-29: 16 mg via INTRAVENOUS
  Filled 2021-11-29: qty 5

## 2021-11-29 MED ORDER — AMLODIPINE BESYLATE 10 MG PO TABS
10.0000 mg | ORAL_TABLET | Freq: Every day | ORAL | Status: DC
  Administered 2021-11-29 – 2021-12-02 (×4): 10 mg via ORAL
  Filled 2021-11-29 (×4): qty 1

## 2021-11-29 MED ORDER — INSULIN ASPART 100 UNIT/ML IJ SOLN
0.0000 [IU] | Freq: Three times a day (TID) | INTRAMUSCULAR | Status: DC
  Administered 2021-11-29: 7 [IU] via SUBCUTANEOUS
  Administered 2021-11-29: 9 [IU] via SUBCUTANEOUS
  Administered 2021-11-30: 3 [IU] via SUBCUTANEOUS
  Administered 2021-11-30: 7 [IU] via SUBCUTANEOUS
  Filled 2021-11-29 (×4): qty 1

## 2021-11-29 NOTE — Sepsis Progress Note (Signed)
Sepsis protocol monitored by eLink 

## 2021-11-29 NOTE — ED Provider Notes (Signed)
[facility] Provider Note    Event Date/Time   First MD Initiated Contact with Patient 11/29/21 0133     (approximate)   History   Dysuria and Flank Pain   HPI  Earl Moreno is a 56 y.o. male who presents to the ED for evaluation of Dysuria and Flank Pain   I reviewed DC summary from medical admission 1 year ago.  History of IVDU and polysubstance abuse.  Was admitted for an abscess.  Otherwise history of DM, previous admissions for HHS.  Patient presents with right flank pain and dysuria "for a while."  He reports he feels sick all over.  Poor historian.  Physical Exam   Triage Vital Signs: ED Triage Vitals  Enc Vitals Group     BP 11/28/21 2006 (!) 150/98     Pulse Rate 11/28/21 2006 99     Resp 11/28/21 2006 18     Temp 11/28/21 2006 100.3 F (37.9 C)     Temp Source 11/28/21 2006 Oral     SpO2 11/28/21 2006 95 %     Weight --      Height --      Head Circumference --      Peak Flow --      Pain Score 11/28/21 2032 8     Pain Loc --      Pain Edu? --      Excl. in GC? --     Most recent vital signs: Vitals:   11/29/21 0146 11/29/21 0558  BP: (!) 142/96 (!) 162/100  Pulse: 85 81  Resp: 16 16  Temp:    SpO2: 95% 100%    General: Awake, no distress.  Frail and chronically ill-appearing. CV:  Good peripheral perfusion.  Tachycardic and regular Resp:  Normal effort.   Abd:  No distention. Suprapubic and right-sided CVA tenderness is present.  Otherwise benign abdomen. MSK:  No deformity noted.  Multiple track marks and peripheral scarring noted Neuro:  No focal deficits appreciated. Cranial nerves II through XII intact 5/5 strength and sensation in all 4 extremities Other:     ED Results / Procedures / Treatments   Labs (all labs ordered are listed, but only abnormal results are displayed) Labs Reviewed  CBC WITH DIFFERENTIAL/PLATELET - Abnormal; Notable for the following components:      Result Value   WBC 17.2 (*)     Neutro Abs 14.8 (*)    Abs Immature Granulocytes 0.09 (*)    All other components within normal limits  COMPREHENSIVE METABOLIC PANEL - Abnormal; Notable for the following components:   Sodium 123 (*)    Chloride 83 (*)    Glucose, Bld 426 (*)    Calcium 8.7 (*)    Total Protein 8.4 (*)    Albumin 2.9 (*)    Alkaline Phosphatase 183 (*)    All other components within normal limits  URINALYSIS, ROUTINE W REFLEX MICROSCOPIC - Abnormal; Notable for the following components:   Color, Urine YELLOW (*)    APPearance HAZY (*)    Glucose, UA >=500 (*)    Hgb urine dipstick MODERATE (*)    Ketones, ur 5 (*)    Protein, ur 30 (*)    Leukocytes,Ua TRACE (*)    Bacteria, UA RARE (*)    All other components within normal limits  URINE DRUG SCREEN, QUALITATIVE ([facility] ONLY) - Abnormal; Notable for the following components:   Cocaine Metabolite,Ur [place] POSITIVE (*)  All other components within normal limits  CULTURE, BLOOD (ROUTINE X 2)  CULTURE, BLOOD (ROUTINE X 2)  PROCALCITONIN  LACTIC ACID, PLASMA  LACTIC ACID, PLASMA    EKG Sinus rhythm with a rate of 81 bpm.  Normal axis and intervals.  No evidence of acute ischemia.  RADIOLOGY CXR interpreted by me without evidence of acute cardiopulmonary pathology. CT renal study interpreted by me with mild right-sided perinephric stranding and fullness, difficult due to lack of fat  Official radiology report(s): DG Chest Portable 1 View  Result Date: 11/29/2021 CLINICAL DATA:  IVDU, eval infiltrate.  Right flank pain. EXAM: PORTABLE CHEST 1 VIEW COMPARISON:  Abdominal CT earlier today.  Chest x-ray 02/09/2020 FINDINGS: Previously seen tree-in-bud densities on CT cannot be appreciated by plain film. No visible airspace opacities. No effusions. Heart is normal size. No acute bony abnormality. IMPRESSION: No active disease. Electronically Signed   By: Rolm Baptise M.D.   On: 11/29/2021 03:33   CT Renal Stone Study  Result Date: 11/29/2021 CLINICAL  DATA:  Right-sided flank pain EXAM: CT ABDOMEN AND PELVIS WITHOUT CONTRAST TECHNIQUE: Multidetector CT imaging of the abdomen and pelvis was performed following the standard protocol without IV contrast. RADIATION DOSE REDUCTION: This exam was performed according to the departmental dose-optimization program which includes automated exposure control, adjustment of the mA and/or kV according to patient size and/or use of iterative reconstruction technique. COMPARISON:  CT 10/28/2013 FINDINGS: Lower chest: Lung bases demonstrate mild bronchiectasis at the lingula. Mild tree-in-bud density at the lingula and left greater than right lung base. No acute consolidation or effusion. Hepatobiliary: Contracted gallbladder. No calcified stone or biliary dilatation Pancreas: Unremarkable. No pancreatic ductal dilatation or surrounding inflammatory changes. Spleen: Normal in size without focal abnormality. Adrenals/Urinary Tract: Adrenal glands are normal. No significant hydronephrosis. Examination is significantly limited by paucity of intra-abdominal fat. Right kidney appears enlarged compared to the left, suspicion mild perinephric edema. Prominent right renal pelvis, without definitive ureteral stone. The bladder is unremarkable Stomach/Bowel: The stomach is nonenlarged. There is no dilated small bowel. No acute bowel wall thickening Vascular/Lymphatic: Advanced aortic atherosclerosis. No aneurysm. No suspicious lymph nodes Reproductive: Prostate is unremarkable. Other: Negative for pelvic effusion or free air. Musculoskeletal: No acute or significant osseous findings. IMPRESSION: 1. Very limited study secondary to paucity of intra-abdominal fat. 2. Asymmetrically enlarged right kidney with suspicion of perinephric edema. Some renal pelvis dilatation without convincing obstructing stone, findings could be secondary to pyelonephritis. 3. Mild tree-in-bud density at the lingula and lung bases consistent with respiratory  infection, potentially due to atypical organism Electronically Signed   By: Donavan Foil M.D.   On: 11/29/2021 02:47    PROCEDURES and INTERVENTIONS:  .1-3 Lead EKG Interpretation  Performed by: Vladimir Crofts, MD Authorized by: Vladimir Crofts, MD     Interpretation: normal     ECG rate:  92   ECG rate assessment: normal     Rhythm: sinus rhythm     Ectopy: none     Conduction: normal   .Critical Care  Performed by: Vladimir Crofts, MD Authorized by: Vladimir Crofts, MD   Critical care provider statement:    Critical care time (minutes):  30   Critical care time was exclusive of:  Separately billable procedures and treating other patients   Critical care was necessary to treat or prevent imminent or life-threatening deterioration of the following conditions:  Sepsis   Critical care was time spent personally by me on the following activities:  Development of  treatment plan with patient or surrogate, discussions with consultants, evaluation of patient's response to treatment, examination of patient, ordering and review of laboratory studies, ordering and review of radiographic studies, ordering and performing treatments and interventions, pulse oximetry, re-evaluation of patient's condition and review of old charts   Medications  lactated ringers infusion ( Intravenous New Bag/Given 11/29/21 0422)  lactated ringers bolus 1,000 mL (0 mLs Intravenous Stopped 11/29/21 0425)  ketamine 50 mg in normal saline 5 mL (10 mg/mL) syringe (16 mg Intravenous Given 11/29/21 0323)  ceFEPIme (MAXIPIME) 2 g in sodium chloride 0.9 % 100 mL IVPB (0 g Intravenous Stopped 11/29/21 0452)  vancomycin (VANCOCIN) IVPB 1000 mg/200 mL premix (0 mg Intravenous Stopped 11/29/21 0554)     IMPRESSION / MDM / ASSESSMENT AND PLAN / ED COURSE  I reviewed the triage vital signs and the nursing notes.  Differential diagnosis includes, but is not limited to, sepsis, acute cystitis, ureteral stone, pyelonephritis,  bacteremia  {Patient presents with symptoms of an acute illness or injury that is potentially life-threatening.  56 year old active IVDU presents to the ED with signs of sepsis from pyelonephritis, possible bacteremia, and requiring medical admission.  He has suprapubic and right-sided flank pain.  Looks chronically ill.  Blood work with leukocytosis and hyponatremia.  Elevated procalcitonin, but negative lactic acid.  Urine with some mildly infectious features in the setting of his symptoms and will be sent for culture.  Blood cultures drawn and started on broad-spectrum antibiotics.  We will consult medicine for admission.      FINAL CLINICAL IMPRESSION(S) / ED DIAGNOSES   Final diagnoses:  Pyelonephritis  Sepsis without acute organ dysfunction, due to unspecified organism (HCC)  Hyponatremia     Rx / DC Orders   ED Discharge Orders     None        Note:  This document was prepared using Dragon voice recognition software and may include unintentional dictation errors.   Delton [place], MD 11/29/21 [phone]

## 2021-11-29 NOTE — Progress Notes (Signed)
Pt took his tele off and refused to keep it on. MD made aware

## 2021-11-29 NOTE — Assessment & Plan Note (Addendum)
Most likely secondary to hyperglycemia Expect improvement in serum sodium levels following resolution of hyperglycemia. Continue aggressive IV fluid hydration

## 2021-11-29 NOTE — Progress Notes (Signed)
11/29/2021 at 2030:  Pt refusing to wear telemetry monitor. Pt also refusing lab draws and red alert medications. Provider on call notified.

## 2021-11-29 NOTE — Consult Note (Signed)
Pharmacy Antibiotic Note  Earl Moreno is a 56 y.o. male admitted on 11/29/2021 with bacteremia.  Pharmacy has been consulted for Vancomycin dosing.  Plan:F/u MRSA PCR ordered & cultures.  Pt received Vancomycin 1g 7/11 0400; Will stack dose at ~12hrs post loading dose with maintenance of 1250mg  q24h Goal AUC 400-550  Est AUC: 509; Cmax: 41.9; Cmin: 9.7 SCr 0.8 (rounded up); IBW/TBW; Vd 0.72  Height: 5' (152.4 cm) Weight: 52.5 kg (115 lb 11.2 oz) IBW/kg (Calculated) : 50  Temp (24hrs), Avg:99.3 F (37.4 C), Min:98.3 F (36.8 C), Max:100.3 F (37.9 C)  Recent Labs  Lab 11/28/21 2032 11/29/21 0219  WBC 17.2*  --   CREATININE 0.67  --   LATICACIDVEN  --  1.6    Estimated Creatinine Clearance: 72.9 mL/min (by C-G formula based on SCr of 0.67 mg/dL).    No Known Allergies  Antimicrobials this admission: Vancomycin (7/11 >>  Ceftriaxone (7/11 >>   Dose adjustments this admission: Currently at [facility], CTM and adjust PRN  Microbiology results: 7/11 BCx: pending 7/11 MRSA PCR: pending  Thank you for allowing pharmacy to be a part of this patient's care.  9/11 Stepfanie Yott 11/29/2021 10:10 AM

## 2021-11-29 NOTE — Inpatient Diabetes Management (Signed)
Inpatient Diabetes Program Recommendations  AACE/ADA: New Consensus Statement on Inpatient Glycemic Control (2015)  Target Ranges:  Prepandial:   less than 140 mg/dL      Peak postprandial:   less than 180 mg/dL (1-2 hours)      Critically ill patients:  140 - 180 mg/dL   Lab Results  Component Value Date   GLUCAP 456 (H) 02/26/2021   HGBA1C 14.4 (H) 10/19/2020    Review of Glycemic Control  Latest Reference Range & Units 11/28/21 20:32  Glucose 70 - 99 mg/dL 225 (H)   Diabetes history: DM 2 Outpatient Diabetes medications: 75/25 30 units bid Current orders for Inpatient glycemic control: None being evaluated in ED  Inpatient Diabetes Program Recommendations:    -  Consider Semglee 30 units -  Novolog 0-15 units tid + hs -  Novolog 4 units tid meal coverage if eating >50% of meals  THanks,  Christena Deem RN, MSN, BC-ADM Inpatient Diabetes Coordinator Team Pager [phone] (8a-5p)

## 2021-11-29 NOTE — Inpatient Diabetes Management (Signed)
Inpatient Diabetes Program Recommendations  AACE/ADA: New Consensus Statement on Inpatient Glycemic Control  Target Ranges:  Prepandial:   less than 140 mg/dL      Peak postprandial:   less than 180 mg/dL (1-2 hours)      Critically ill patients:  140 - 180 mg/dL    Latest Reference Range & Units 11/29/21 11:39  Glucose-Capillary 70 - 99 mg/dL 793 (H)    Latest Reference Range & Units 11/28/21 20:32  CO2 22 - 32 mmol/L 28  Glucose 70 - 99 mg/dL 903 (H)  Anion gap 5 - 15  12    Latest Reference Range & Units 02/09/20 03:55 10/19/20 16:44  Hemoglobin A1C 4.8 - 5.6 % >15.5 (H) 14.4 (H)   Review of Glycemic Control  Diabetes history: DM2 hx (dx in September 2021) Outpatient Diabetes medications: None; was prescribed Humalog 75/25 30 units BID Current orders for Inpatient glycemic control: Levemir 15 units daily, Novolog 0-9 units TID with meals  NOTE: Noted consult for diabetes coordinator. Chart reviewed. Patient was inpatient 02/09/20-02/14/20 and was dx with DM during that admission. Patient was seen by inpatient diabetes coordinator on 02/10/20 and was discharged on 70/30 35 units BID [facility] letter given since [facility] was closed at time of discharge on 02/14/20) and was instructed to go to [facility] on the following Monday and switch to Humalog 75/25 35 units BID. Patient was inpatient again 10/19/20-10/20/20 and was to go to [facility] to get insulin after discharged. Spoke with patient over the phone to inquire about DM medication. Patient states that he has not been taking any DM medication because "I don't have any money and can not afford to buy medicine."  Patient reports that he has taken insulin but does not remember what it was.  Patient reports he does not check glucose and does not have a glucometer or testing supplies. Patient reports he lives in his car and doesn't have any money to pay for medications. Patient kept stating that "I  don't know anything. You will have to ask my girlfriend and she is not here" to various questions.  Informed patient that diabetes coordinator will follow up with him tomorrow.   Thanks, [name], RN, MSN, CDCES Diabetes Coordinator Inpatient Diabetes Program [phone] (Team Pager from 8am to 5pm)

## 2021-11-29 NOTE — Assessment & Plan Note (Signed)
Patient has a history of IV drug use and is at increased risk for bacteremia and septic emboli. Follow-up results of blood cultures Monitor closely for withdrawal symptoms

## 2021-11-29 NOTE — Assessment & Plan Note (Addendum)
Secondary to medication noncompliance Patient's last hemoglobin A1c level was over 14 Start patient on Levemir 15 units subcu daily and uptitrate to optimize glycemic control Sliding scale insulin Maintain consistent carbohydrate diet Continue aggressive IV fluid hydration Diabetic education

## 2021-11-29 NOTE — H&P (Signed)
History and Physical    Patient: Earl Moreno IOX:[id number] DOB: [birth year] DOA: 11/29/2021 DOS: the patient was seen and examined on 11/29/2021 PCP: Pcp, No  Patient coming from: Home  Chief Complaint:  Chief Complaint  Patient presents with   Dysuria   Flank Pain   HPI: Earl Moreno is a 56 y.o. male with medical history significant for diabetes mellitus, IV drug use and nicotine dependence who presents to the ER for evaluation right-sided flank pain for several weeks rated about 10 x 10 in intensity at its worst associated with dysuria, frequency and hematuria.  He complains of increased thirst. He denies having any fever or chills.  He denies having any chest pain, no shortness of breath, no nausea, no vomiting, no dizziness, no lightheadedness, no headache, no blurred vision no focal deficit. Patient admits to losing 100 pounds over the last 1 year. Upon arrival to the ER he was found to have a Tmax of 100.3. Patient had a CT scan of abdomen and pelvis which showed an asymmetrically enlarged right kidney with suspicion of perinephric edema.  Renal pelvis dilatation without convincing obstructing stone, findings could be secondary to pyelonephritis. He received a dose of cefepime and vancomycin in the ER and will be admitted to the hospital for further evaluation.  Review of Systems: As mentioned in the history of present illness. All other systems reviewed and are negative. Past Medical History:  Diagnosis Date   Diabetes mellitus without complication [facility])    Past Surgical History:  Procedure Laterality Date   I & D EXTREMITY Left 06/05/2018   Procedure: IRRIGATION AND DEBRIDEMENT EXTREMITY-LEFT HAND;  Surgeon: Lovell Sheehan, MD;  Location: [facility] ORS;  Service: Orthopedics;  Laterality: Left;   Social History:  reports that he has been smoking. He has been smoking an average of .5 packs per day. He has never used smokeless tobacco. He reports current drug use. Drugs: Cocaine  and IV. He reports that he does not drink alcohol.  No Known Allergies  Family History  Problem Relation Age of Onset   Diabetes Mother     Prior to Admission medications   Medication Sig Start Date End Date Taking? Authorizing Provider  amLODipine (NORVASC) 10 MG tablet Take 1 tablet (10 mg total) by mouth daily. 02/26/21   [name], MD  Blood Glucose Monitoring Suppl (BLOOD GLUCOSE MONITOR SYSTEM) w/Device KIT USE UP TO 4 TIMES DAILY AS DIRECTED. 02/26/21   [name], MD  DULoxetine (CYMBALTA) 20 MG capsule Take 1 capsule (20 mg total) by mouth daily. 10/21/20   [name], MD  Ensure Max Protein (ENSURE MAX PROTEIN) LIQD Take 330 mLs (11 oz total) by mouth 2 (two) times daily between meals. 02/14/20   [name], [name], DO  glucose blood (RIGHTEST [phone] BLOOD GLUCOSE) test strip USE AS DIRECTED UP TO 4 TIMES DAILY. 02/26/21   [name], MD  Insulin Lispro Prot & Lispro (HUMALOG MIX 75/25 KWIKPEN) (75-25) 100 UNIT/ML Kwikpen Inject 30 Units into the skin 2 (two) times daily. 02/26/21   [name], MD  Insulin Pen Needle (COMFORT EZ PEN NEEDLES) 32G X 4 MM MISC USE AS DIRECTED TWICE DAILY (MORNING AND BEDTIME) 02/26/21   [name], MD  Multiple Vitamin (MULTIVITAMIN WITH MINERALS) TABS tablet Take 1 tablet by mouth daily. 10/20/20   [name], MD  nicotine (NICODERM CQ - DOSED IN MG/24 HOURS) 14 mg/24hr patch Place 1 patch (14 mg total) onto the  skin daily. 10/21/20   [name], MD  Rightest GL300 Lancets MISC USE AS DIRECTED UP TO 4 TIMES DAILY. 02/26/21   [name], MD    Physical Exam: Vitals:   11/29/21 0146 11/29/21 0310 11/29/21 0558 11/29/21 0830  BP: (!) 142/96  (!) 162/100 (!) 144/97  Pulse: 85  81 100  Resp: _0 Temp:    98.3 F (36.8 C)  TempSrc:    Oral  SpO2: 95%  100% 98%  Weight:  52.5 kg    Height:  5' (1.524 m)     Physical Exam Vitals and nursing note reviewed.  Constitutional:      Comments: Chronically  ill-appearing, thin  HENT:     Head: Normocephalic and atraumatic.     Nose: Nose normal.     Mouth/Throat:     Mouth: Mucous membranes are dry.  Eyes:     Conjunctiva/sclera: Conjunctivae normal.  Cardiovascular:     Rate and Rhythm: Normal rate and regular rhythm.  Pulmonary:     Effort: Pulmonary effort is normal.     Breath sounds: Normal breath sounds.  Abdominal:     General: Abdomen is flat. Bowel sounds are normal.     Palpations: Abdomen is soft.     Tenderness: There is abdominal tenderness.     Comments: Right flank pain and tenderness  Genitourinary:    Comments: Right CVA tenderness Musculoskeletal:        General: Normal range of motion.     Cervical back: Normal range of motion and neck supple.  Skin:    General: Skin is warm and dry.     Comments: Track marks in his upper extremities  Neurological:     General: No focal deficit present.     Mental Status: He is alert.  Psychiatric:        Mood and Affect: Mood normal.        Behavior: Behavior normal.     Data Reviewed: Relevant notes from primary care and specialist visits, past discharge summaries as available in EHR, including Care Everywhere. Prior diagnostic testing as pertinent to current admission diagnoses Updated medications and problem lists for reconciliation ED course, including vitals, labs, imaging, treatment and response to treatment Triage notes, nursing and pharmacy notes and ED provider's notes Notable results as noted in HPI Labs reviewed.  Urine analysis shows moderate blood.  Trace leukocyte esterase.  11-20 WBCs with rare bacteria Procalcitonin 1.25, lactic acid 1.6, sodium 123, potassium 3.7, chloride 83, bicarb 28, glucose 426, BUN 14, creatinine 0.67, calcium 8.7, total protein 8.4, albumin 2.9, AST 16, ALT 18, alk phos 183, total bilirubin 0.9, white count 17.2, hemoglobin 13.9, hematocrit 39.8, RDW 12.3, platelet count 237 Chest x-ray reviewed by me shows no evidence of acute  cardiopulmonary disease Renal stone CT shows limited study secondary to paucity of intra-abdominal fat. Asymmetrically enlarged right kidney with suspicion of perinephric edema. Some renal pelvis dilatation without convincing obstructing stone, findings could be secondary to pyelonephritis. Mild tree-in-bud density at the lingula and lung bases consistent with respiratory infection, potentially due to atypical organism Twelve-lead EKG reviewed by me shows normal sinus rhythm There are no new results to review at this time.  Assessment and Plan: * Acute pyelonephritis Patient presents for evaluation of right flank pain associated with hematuria, frequency and dysuria for several weeks. Imaging shows possible acute pyelonephritis and he has right CVA tenderness. We will treat patient empirically with IV Rocephin while  awaiting results of urine culture We will also add IV vancomycin due to concerns for possible bacteremia and septic emboli resulting in severe right flank pain since patient has a history of IV drug abuse. Follow-up results of blood cultures Pain control  Hyperglycemia due to type 2 diabetes mellitus ([place]) Secondary to medication noncompliance Patient's last hemoglobin A1c level was over 14 Start patient on Levemir 15 units subcu daily and uptitrate to optimize glycemic control Sliding scale insulin Maintain consistent carbohydrate diet Continue aggressive IV fluid hydration Diabetic education  Hyponatremia Most likely secondary to hyperglycemia Expect improvement in serum sodium levels following resolution of hyperglycemia. Continue aggressive IV fluid hydration  Polysubstance abuse ([place]) Patient has a history of polysubstance abuse including IVDA. He has been counseled on the need to abstain from illicit drug  Heroin addiction [facility]) Patient has a history of IV drug use and is at increased risk for bacteremia and septic emboli. Follow-up results of blood  cultures Monitor closely for withdrawal symptoms     Advance Care Planning:   Code Status: Full Code   Consults: None  Family Communication: Greater than 50% of time was spent discussing patient's condition and plan of care with him at the bedside.  All questions and concerns have been addressed.  He verbalizes understanding and agrees with the plan.  Severity of Illness: The appropriate patient status for this patient is INPATIENT. Inpatient status is judged to be reasonable and necessary in order to provide the required intensity of service to ensure the patient's safety. The patient's presenting symptoms, physical exam findings, and initial radiographic and laboratory data in the context of their chronic comorbidities is felt to place them at high risk for further clinical deterioration. Furthermore, it is not anticipated that the patient will be medically stable for discharge from the hospital within 2 midnights of admission.   * I certify that at the point of admission it is my clinical judgment that the patient will require inpatient hospital care spanning beyond 2 midnights from the point of admission due to high intensity of service, high risk for further deterioration and high frequency of surveillance required.*  Author: Collier Bullock, MD 11/29/2021 10:05 AM  For on call review [email or web address].

## 2021-11-29 NOTE — Assessment & Plan Note (Signed)
Patient presents for evaluation of right flank pain associated with hematuria, frequency and dysuria for several weeks. Imaging shows possible acute pyelonephritis and he has right CVA tenderness. We will treat patient empirically with IV Rocephin while awaiting results of urine culture We will also add IV vancomycin due to concerns for possible bacteremia and septic emboli resulting in severe right flank pain since patient has a history of IV drug abuse. Follow-up results of blood cultures Pain control

## 2021-11-29 NOTE — Consult Note (Signed)
PHARMACY - PHYSICIAN COMMUNICATION CRITICAL VALUE ALERT - BLOOD CULTURE IDENTIFICATION (BCID)  Earl Moreno is an 56 y.o. male who presented to [facility] on 11/29/2021 with a chief complaint of IV drug use and nicotine dependence who presents to the ER for evaluation right-sided flank pain for several weeks rated about 10 x 10 in intensity at its worst associated with dysuria, frequency and hematuria.  Assessment:  3 out of 4 GNR. BCID was positive for klebsiella aerogenes.   Name of physician (or Provider) Contacted: [place]  Current antibiotics: ceftriaxone + vancomycin  Changes to prescribed antibiotics recommended: cefepime Recommendations accepted by provider  Results for orders placed or performed during the hospital encounter of 11/29/21  Blood Culture ID Panel (Reflexed) (Collected: 11/29/2021  2:19 AM)  Result Value Ref Range   Enterococcus faecalis NOT DETECTED NOT DETECTED   Enterococcus Faecium NOT DETECTED NOT DETECTED   Listeria monocytogenes NOT DETECTED NOT DETECTED   Staphylococcus species NOT DETECTED NOT DETECTED   Staphylococcus aureus (BCID) NOT DETECTED NOT DETECTED   Staphylococcus epidermidis NOT DETECTED NOT DETECTED   Staphylococcus lugdunensis NOT DETECTED NOT DETECTED   Streptococcus species NOT DETECTED NOT DETECTED   Streptococcus agalactiae NOT DETECTED NOT DETECTED   Streptococcus pneumoniae NOT DETECTED NOT DETECTED   Streptococcus pyogenes NOT DETECTED NOT DETECTED   A.calcoaceticus-baumannii NOT DETECTED NOT DETECTED   Bacteroides fragilis NOT DETECTED NOT DETECTED   Enterobacterales DETECTED (A) NOT DETECTED   Enterobacter cloacae complex NOT DETECTED NOT DETECTED   Escherichia coli NOT DETECTED NOT DETECTED   Klebsiella aerogenes DETECTED (A) NOT DETECTED   Klebsiella oxytoca NOT DETECTED NOT DETECTED   Klebsiella pneumoniae NOT DETECTED NOT DETECTED   Proteus species NOT DETECTED NOT DETECTED   Salmonella species NOT DETECTED NOT DETECTED    Serratia marcescens NOT DETECTED NOT DETECTED   Haemophilus influenzae NOT DETECTED NOT DETECTED   Neisseria meningitidis NOT DETECTED NOT DETECTED   Pseudomonas aeruginosa NOT DETECTED NOT DETECTED   Stenotrophomonas maltophilia NOT DETECTED NOT DETECTED   Candida albicans NOT DETECTED NOT DETECTED   Candida auris NOT DETECTED NOT DETECTED   Candida glabrata NOT DETECTED NOT DETECTED   Candida krusei NOT DETECTED NOT DETECTED   Candida parapsilosis NOT DETECTED NOT DETECTED   Candida tropicalis NOT DETECTED NOT DETECTED   Cryptococcus neoformans/gattii NOT DETECTED NOT DETECTED   CTX-M ESBL NOT DETECTED NOT DETECTED   Carbapenem resistance IMP NOT DETECTED NOT DETECTED   Carbapenem resistance KPC NOT DETECTED NOT DETECTED   Carbapenem resistance NDM NOT DETECTED NOT DETECTED   Carbapenem resist OXA 48 LIKE NOT DETECTED NOT DETECTED   Carbapenem resistance VIM NOT DETECTED NOT DETECTED    Ronnald Ramp 11/29/2021  4:43 PM

## 2021-11-29 NOTE — Assessment & Plan Note (Signed)
Patient has a history of polysubstance abuse including IVDA. He has been counseled on the need to abstain from illicit drug

## 2021-11-29 NOTE — ED Notes (Signed)
Pt placed in 5H, complaining he doesn't care, he was not given his BP meds, his bipolar meds, or anything for pain. Pt stating nurses take more drugs then he does, and threatening to leave if his significant other is told to leave.

## 2021-11-29 NOTE — Progress Notes (Signed)
CODE SEPSIS - PHARMACY COMMUNICATION  **Broad Spectrum Antibiotics should be administered within 1 hour of Sepsis diagnosis**  Time Code Sepsis Called/Page Received: 3614  Antibiotics Ordered: Cefepime & Vancomycin  Time of 1st antibiotic administration: 0403  Otelia Sergeant, PharmD, [facility] 11/29/2021 3:55 AM

## 2021-11-29 NOTE — ED Notes (Signed)
Pt refused second IV

## 2021-11-30 LAB — BASIC METABOLIC PANEL
Anion gap: 9 (ref 5–15)
BUN: 10 mg/dL (ref 6–20)
CO2: 24 mmol/L (ref 22–32)
Calcium: 8.1 mg/dL — ABNORMAL LOW (ref 8.9–10.3)
Chloride: 98 mmol/L (ref 98–111)
Creatinine, Ser: 0.5 mg/dL — ABNORMAL LOW (ref 0.61–1.24)
GFR, Estimated: >60 mL/min (ref >60)
Glucose, Bld: 225 mg/dL — ABNORMAL HIGH (ref 70–99)
Potassium: 2.7 mmol/L — CL (ref 3.5–5.1)
Sodium: 131 mmol/L — ABNORMAL LOW (ref 135–145)

## 2021-11-30 LAB — CBC
HCT: 37.6 % — ABNORMAL LOW (ref 39.0–52.0)
Hemoglobin: 13.1 g/dL (ref 13.0–17.0)
MCH: 30 pg (ref 26.0–34.0)
MCHC: 34.8 g/dL (ref 30.0–36.0)
MCV: 86 fL (ref 80.0–100.0)
Platelets: 230 10*3/uL (ref 150–400)
RBC: 4.37 MIL/uL (ref 4.22–5.81)
RDW: 12.1 % (ref 11.5–15.5)
WBC: 15.3 10*3/uL — ABNORMAL HIGH (ref 4.0–10.5)
nRBC: 0 % (ref 0.0–0.2)

## 2021-11-30 LAB — GLUCOSE, CAPILLARY
Glucose-Capillary: 219 mg/dL — ABNORMAL HIGH (ref 70–99)
Glucose-Capillary: 323 mg/dL — ABNORMAL HIGH (ref 70–99)
Glucose-Capillary: 260 mg/dL — ABNORMAL HIGH (ref 70–99)
Glucose-Capillary: 357 mg/dL — ABNORMAL HIGH (ref 70–99)

## 2021-11-30 LAB — PROCALCITONIN: Procalcitonin: 0.68 ng/mL

## 2021-11-30 MED ORDER — INSULIN ASPART 100 UNIT/ML IJ SOLN
0.0000 [IU] | Freq: Three times a day (TID) | INTRAMUSCULAR | Status: DC
  Administered 2021-11-30: 8 [IU] via SUBCUTANEOUS
  Administered 2021-12-01: 5 [IU] via SUBCUTANEOUS
  Administered 2021-12-01: 8 [IU] via SUBCUTANEOUS
  Administered 2021-12-01 – 2021-12-02 (×2): 15 [IU] via SUBCUTANEOUS
  Filled 2021-11-30 (×5): qty 1

## 2021-11-30 MED ORDER — CHLORHEXIDINE GLUCONATE CLOTH 2 % EX PADS
6.0000 | MEDICATED_PAD | Freq: Every day | CUTANEOUS | Status: DC
  Administered 2021-11-30 – 2021-12-02 (×3): 6 via TOPICAL

## 2021-11-30 MED ORDER — NEPRO/CARBSTEADY PO LIQD
237.0000 mL | Freq: Three times a day (TID) | ORAL | Status: DC
  Administered 2021-11-30 – 2021-12-02 (×5): 237 mL via ORAL

## 2021-11-30 MED ORDER — INSULIN ASPART 100 UNIT/ML IJ SOLN
0.0000 [IU] | Freq: Every day | INTRAMUSCULAR | Status: DC
  Administered 2021-11-30: 5 [IU] via SUBCUTANEOUS
  Filled 2021-11-30: qty 1

## 2021-11-30 MED ORDER — POTASSIUM CHLORIDE CRYS ER 20 MEQ PO TBCR
40.0000 meq | EXTENDED_RELEASE_TABLET | Freq: Three times a day (TID) | ORAL | Status: AC
  Administered 2021-11-30 – 2021-12-01 (×4): 40 meq via ORAL
  Filled 2021-11-30 (×4): qty 2

## 2021-11-30 MED ORDER — ACETAMINOPHEN 325 MG PO TABS
650.0000 mg | ORAL_TABLET | Freq: Four times a day (QID) | ORAL | Status: DC
  Administered 2021-11-30 – 2021-12-02 (×7): 650 mg via ORAL
  Filled 2021-11-30 (×8): qty 2

## 2021-11-30 MED ORDER — INSULIN ASPART 100 UNIT/ML IJ SOLN
4.0000 [IU] | Freq: Three times a day (TID) | INTRAMUSCULAR | Status: DC
  Administered 2021-11-30 – 2021-12-01 (×4): 4 [IU] via SUBCUTANEOUS
  Filled 2021-11-30 (×4): qty 1

## 2021-11-30 MED ORDER — SODIUM CHLORIDE 0.9 % IV SOLN: INTRAVENOUS | Status: DC | PRN

## 2021-11-30 MED ORDER — BACLOFEN 10 MG PO TABS
5.0000 mg | ORAL_TABLET | Freq: Three times a day (TID) | ORAL | Status: DC
  Administered 2021-11-30 – 2021-12-02 (×7): 5 mg via ORAL
  Filled 2021-11-30 (×7): qty 1

## 2021-11-30 MED ORDER — CELECOXIB 100 MG PO CAPS
100.0000 mg | ORAL_CAPSULE | Freq: Two times a day (BID) | ORAL | Status: DC
  Administered 2021-11-30 – 2021-12-02 (×5): 100 mg via ORAL
  Filled 2021-11-30 (×5): qty 1

## 2021-11-30 MED ORDER — SODIUM CHLORIDE 0.9% FLUSH
10.0000 mL | Freq: Two times a day (BID) | INTRAVENOUS | Status: DC
  Administered 2021-11-30 – 2021-12-02 (×5): 10 mL

## 2021-11-30 MED ORDER — SODIUM CHLORIDE 0.9% FLUSH: 10.0000 mL | INTRAVENOUS | Status: DC | PRN

## 2021-11-30 NOTE — TOC Initial Note (Addendum)
Transition of Care [facility]) - Initial/Assessment Note    Patient Details  Name: Earl Moreno MRN: [id number] Date of Birth: Oct 07, 1965  [facility]) CM/SW Contact:    Caryn Section, RN Phone Number: 11/30/2021, 3:57 PM  Clinical Narrative:   Patient is homeless, heroin addict.  He was smoking in his room prior to [facility] arrival.  Staff is aware and has handled the situation.  Patient states he would like to apply for disability, financial services made aware of needed consult.  Patient has no PCP, [facility] information provided to patient, who states that he does not read and write.  Patient has a "friend" in room who states she will complete application for him and assist him, patient is in agreement with friend assisting him.   Substance abuse resources also provided to patient for review.  He agrees to review them with his friend.   Expected Discharge Plan:  (TBD) Barriers to Discharge: Continued Medical Work up   Patient Goals and CMS Choice        Expected Discharge Plan and Services Expected Discharge Plan:  (TBD) In-house Referral: Artist, PCP / [facility] Discharge Planning Services: CM Consult   Living arrangements for the past 2 months: Homeless                                      Prior Living Arrangements/Services Living arrangements for the past 2 months: Homeless   Patient language and need for interpreter reviewed:: Yes Do you feel safe going back to the place where you live?:  (patient is homeless)      Need for Family Participation in Patient Care: No (Comment) Care giver support system in place?: No (comment)      Activities of Daily Living Home Assistive Devices/Equipment: None ADL Screening (condition at time of admission) Patient's cognitive ability adequate to safely complete daily activities?: Yes Is the patient deaf or have difficulty hearing?: No Does the patient have difficulty seeing, even when  wearing glasses/contacts?: No Does the patient have difficulty concentrating, remembering, or making decisions?: No Patient able to express need for assistance with ADLs?: No Does the patient have difficulty dressing or bathing?: No Independently performs ADLs?: Yes (appropriate for developmental age) Does the patient have difficulty walking or climbing stairs?: No Weakness of Legs: None Weakness of Arms/Hands: None  Permission Sought/Granted Permission sought to share information with : Case Manager Permission granted to share information with : Yes, Verbal Permission Granted              Emotional Assessment Appearance:: Appears older than stated age Attitude/Demeanor/Rapport: Lethargic Affect (typically observed): Flat   Alcohol / Substance Use: Illicit Drugs Psych Involvement: No (comment)  Admission diagnosis:  Bacteremia [R78.81] Hyponatremia [E87.1] Pyelonephritis [N12] Sepsis without acute organ dysfunction, due to unspecified organism [facility]) [A41.9] Patient Active Problem List   Diagnosis Date Noted   Acute pyelonephritis 11/29/2021   Abscess of multiple sites    Hyperosmolar hyperglycemic state (HHS) (HCC)    Polysubstance abuse (HCC)    Protein-calorie malnutrition, severe 02/11/2020   New onset type 2 diabetes mellitus (HCC) 02/09/2020   Hyperglycemia due to type 2 diabetes mellitus (HCC) 02/09/2020   Hyperglycemia due to diabetes mellitus (HCC) 02/09/2020   Abscess 02/09/2020   Heroin addiction (HCC) 02/09/2020   Hyponatremia 02/09/2020   Essential hypertension 02/09/2020   Cellulitis of left hand 06/02/2018  PCP:  Pcp, No Pharmacy:   [facility] [address]), [place] - [address] [place]) [place] [address] Phone: [phone] Fax: [phone]  [facility] [address] [place] [address] Phone: [phone] Fax: [phone]     Social Determinants of Health  (SDOH) Interventions    Readmission Risk Interventions     No data to display

## 2021-11-30 NOTE — Progress Notes (Signed)
11/30/2021 at 0417:  Pt refused vital signs. Pt refused linen change although fitted sheet is visibly soiled.

## 2021-11-30 NOTE — Progress Notes (Signed)
PROGRESS NOTE    Earl Moreno  ACZ:[id number] DOB: 1965/09/24 DOA: 11/29/2021 PCP: Pcp, No    Brief Narrative:  56 year old gentleman, homeless, heroin user and smoker, history of insulin-dependent diabetes presented to the ER with right flank pain, dysuria frequency and hematuria ongoing for more than 1 month.  He is homeless and lives with his girlfriend in a car.  His family found him yesterday and he looked very unwell so brought to the ER.  In the emergency room temperature 100.3.  CT scan abdomen pelvis asymmetrically enlarged right kidney and perinephric edema, renal stones but no obstruction.  Cultures were drawn and patient admitted with IV antibiotics.   Assessment & Plan:   Sepsis present on admission secondary to acute pyelonephritis and gram-negative bacteremia: Clinically improving Blood cultures with Enterococcus aerogenes, final sensitivity pending. Urine cultures not collected.  UA grossly infected. CT scan with kidney stones but there is no obstruction or hydronephrosis. Continue cefepime.  Insulin-dependent type 2 diabetes, uncontrolled with hyperglycemia: Hemoglobin A1c more than 15.  Homeless, no access to medications, not using any medications. Start long-acting insulin 15 units, prandial insulin 4 units 3 times daily, sliding scale insulin.  Will need case management and charity help.  We will continue to titrate medications.  Hyponatremia: Stable.  Secondary to high blood glucose.  Hypokalemia: Severe.  Replace aggressively.  Recheck levels tomorrow morning.  Check magnesium and phosphorus.  Polysubstance abuse, heroin addiction: Last used a week ago.  No evidence of withdrawals.  Generalized pain issue. Tylenol, Celebrex and baclofen as scheduled.  Avoid IV narcotics.   DVT prophylaxis: enoxaparin (LOVENOX) injection 40 mg Start: 11/29/21 2200   Code Status: Full code Family Communication: Girlfriend, sister and cousin at the bedside Disposition Plan:  Status is: Inpatient Remains inpatient appropriate because: Bacteremia on IV antibiotics, uncontrolled blood sugars     Consultants:  None  Procedures:  None  Antimicrobials:  Cefepime 7/11---   Subjective: Patient seen and examined.  Multiple family members at the bedside.  He was initially sleepy.  He tells me that right flank always hurts.  Wants to get better.  Wants to gain weight.  Objective: Vitals:   11/29/21 1033 11/29/21 1652 11/29/21 2019 11/30/21 0826  BP: (!) 154/106 (!) 150/96 139/84 (!) 173/93  Pulse: 86 84 86 79  Resp: 18 18 18 15   Temp: 98.5 F (36.9 C)  99.2 F (37.3 C) 98.6 F (37 C)  TempSrc: Oral     SpO2: 97% 97% 100% 100%  Weight:      Height:        Intake/Output Summary (Last 24 hours) at 11/30/2021 1409 Last data filed at 11/30/2021 1020 Gross per 24 hour  Intake 1462.27 ml  Output 1950 ml  Net -487.73 ml   Filed Weights   11/29/21 0310  Weight: 52.5 kg    Examination:  General exam: Appears calm and comfortable  Thin and cachectic.  Chronically sick looking. Multiple areas of ecchymosis and nodularity in the skin, injection marks present, no evidence of induration or subdural abscess. Respiratory system: No added sounds. Cardiovascular system: S1 & S2 heard, RRR. No JVD, murmurs, rubs, gallops or clicks. No pedal edema. Gastrointestinal system: Abdomen is nondistended, soft and nontender. No organomegaly or masses felt. Normal bowel sounds heard. No evidence of costophrenic tenderness. Central nervous system: Alert and oriented. No focal neurological deficits. Extremities: Symmetric 5 x 5 power.   Data Reviewed: I have personally reviewed following labs and imaging studies  CBC:  Recent Labs  Lab 11/28/21 2032 11/30/21 0828  WBC 17.2* 15.3*  NEUTROABS 14.8*  --   HGB 13.9 13.1  HCT 39.8 37.6*  MCV 86.7 86.0  PLT 237 230   Basic Metabolic Panel: Recent Labs  Lab 11/28/21 2032 11/30/21 0828  NA 123* 131*  K 3.7 2.7*   CL 83* 98  CO2 28 24  GLUCOSE 426* 225*  BUN 14 10  CREATININE 0.67 0.50*  CALCIUM 8.7* 8.1*   GFR: Estimated Creatinine Clearance: 72.9 mL/min (A) (by C-G formula based on SCr of 0.5 mg/dL (L)). Liver Function Tests: Recent Labs  Lab 11/28/21 2032  AST 16  ALT 18  ALKPHOS 183*  BILITOT 0.9  PROT 8.4*  ALBUMIN 2.9*   No results for input(s): "LIPASE", "AMYLASE" in the last 168 hours. No results for input(s): "AMMONIA" in the last 168 hours. Coagulation Profile: No results for input(s): "INR", "PROTIME" in the last 168 hours. Cardiac Enzymes: No results for input(s): "CKTOTAL", "CKMB", "CKMBINDEX", "TROPONINI" in the last 168 hours. BNP (last 3 results) No results for input(s): "PROBNP" in the last 8760 hours. HbA1C: Recent Labs    11/29/21 1402  HGBA1C 13.2*   CBG: Recent Labs  Lab 11/29/21 1139 11/29/21 1659 11/29/21 2023 11/30/21 0830 11/30/21 1143  GLUCAP 476* 328* 261* 219* 323*   Lipid Profile: No results for input(s): "CHOL", "HDL", "LDLCALC", "TRIG", "CHOLHDL", "LDLDIRECT" in the last 72 hours. Thyroid Function Tests: No results for input(s): "TSH", "T4TOTAL", "FREET4", "T3FREE", "THYROIDAB" in the last 72 hours. Anemia Panel: No results for input(s): "VITAMINB12", "FOLATE", "FERRITIN", "TIBC", "IRON", "RETICCTPCT" in the last 72 hours. Sepsis Labs: Recent Labs  Lab 11/29/21 0219 11/29/21 1402 11/29/21 1828 11/29/21 2208 11/30/21 0828  PROCALCITON 1.25  --   --   --  0.68  LATICACIDVEN 1.6 2.5* 2.1* 1.7  --     Recent Results (from the past 240 hour(s))  Blood culture (routine x 2)     Status: Abnormal (Preliminary result)   Collection Time: 11/29/21  2:19 AM   Specimen: BLOOD  Result Value Ref Range Status   Specimen Description   Final    BLOOD RIGHT ANTECUBITAL Performed at [facility], [address], [place], [place] [address]    Special Requests   Final    BOTTLES DRAWN AEROBIC AND ANAEROBIC Blood Culture adequate  volume Performed at [facility], [address] [place], [place] [address]    Culture  Setup Time   Final    Organism ID to follow GRAM NEGATIVE RODS IN BOTH AEROBIC AND ANAEROBIC BOTTLES CRITICAL RESULT CALLED TO, READ BACK BY AND VERIFIED WITH: Paschal Dopp @1626  11/29/21 MJU Performed at [facility], [address], [place], [place]    Culture (A)  Final    ENTEROBACTER AEROGENES SUSCEPTIBILITIES TO FOLLOW Performed at [facility], [address], [place], [place]    Report Status PENDING  Incomplete  Blood culture (routine x 2)     Status: None (Preliminary result)   Collection Time: 11/29/21  2:19 AM   Specimen: BLOOD  Result Value Ref Range Status   Specimen Description   Final    BLOOD BLOOD RIGHT FOREARM Performed at [facility], [address] [place], [place]    Special Requests   Final    BOTTLES DRAWN AEROBIC AND ANAEROBIC Blood Culture results may not be optimal due to an excessive volume of blood received in culture bottles  Performed at [facility], [address] [place], [place] [address]    Culture  Setup Time   Final    IN BOTH AEROBIC AND ANAEROBIC BOTTLES GRAM NEGATIVE RODS CRITICAL RESULT CALLED TO, READ BACK BY AND VERIFIED WITH: Kevin Fenton PATEL @1626  11/29/21 MJU    Culture GRAM NEGATIVE RODS  Final   Report Status PENDING  Incomplete  Blood Culture ID Panel (Reflexed)     Status: Abnormal   Collection Time: 11/29/21  2:19 AM  Result Value Ref Range Status   Enterococcus faecalis NOT DETECTED NOT DETECTED Final   Enterococcus Faecium NOT DETECTED NOT DETECTED Final   Listeria monocytogenes NOT DETECTED NOT DETECTED Final   Staphylococcus species NOT DETECTED NOT DETECTED Final   Staphylococcus aureus (BCID) NOT DETECTED NOT DETECTED Final   Staphylococcus epidermidis NOT DETECTED NOT DETECTED Final   Staphylococcus lugdunensis NOT DETECTED NOT DETECTED Final   Streptococcus  species NOT DETECTED NOT DETECTED Final   Streptococcus agalactiae NOT DETECTED NOT DETECTED Final   Streptococcus pneumoniae NOT DETECTED NOT DETECTED Final   Streptococcus pyogenes NOT DETECTED NOT DETECTED Final   A.calcoaceticus-baumannii NOT DETECTED NOT DETECTED Final   Bacteroides fragilis NOT DETECTED NOT DETECTED Final   Enterobacterales DETECTED (A) NOT DETECTED Final    Comment: Enterobacterales represent a large order of gram negative bacteria, not a single organism. CRITICAL RESULT CALLED TO, READ BACK BY AND VERIFIED WITH: 01/30/22 PATEL @1626  11/29/21 MJU    Enterobacter cloacae complex NOT DETECTED NOT DETECTED Final   Escherichia coli NOT DETECTED NOT DETECTED Final   Klebsiella aerogenes DETECTED (A) NOT DETECTED Final    Comment: CRITICAL RESULT CALLED TO, READ BACK BY AND VERIFIED WITH: PATEL @1626  11/29/21 MJU    Klebsiella oxytoca NOT DETECTED NOT DETECTED Final   Klebsiella pneumoniae NOT DETECTED NOT DETECTED Final   Proteus species NOT DETECTED NOT DETECTED Final   Salmonella species NOT DETECTED NOT DETECTED Final   Serratia marcescens NOT DETECTED NOT DETECTED Final   Haemophilus influenzae NOT DETECTED NOT DETECTED Final   Neisseria meningitidis NOT DETECTED NOT DETECTED Final   Pseudomonas aeruginosa NOT DETECTED NOT DETECTED Final   Stenotrophomonas maltophilia NOT DETECTED NOT DETECTED Final   Candida albicans NOT DETECTED NOT DETECTED Final   Candida auris NOT DETECTED NOT DETECTED Final   Candida glabrata NOT DETECTED NOT DETECTED Final   Candida krusei NOT DETECTED NOT DETECTED Final   Candida parapsilosis NOT DETECTED NOT DETECTED Final   Candida tropicalis NOT DETECTED NOT DETECTED Final   Cryptococcus neoformans/gattii NOT DETECTED NOT DETECTED Final   CTX-M ESBL NOT DETECTED NOT DETECTED Final   Carbapenem resistance IMP NOT DETECTED NOT DETECTED Final   Carbapenem resistance KPC NOT DETECTED NOT DETECTED Final   Carbapenem resistance NDM  NOT DETECTED NOT DETECTED Final   Carbapenem resist OXA 48 LIKE NOT DETECTED NOT DETECTED Final   Carbapenem resistance VIM NOT DETECTED NOT DETECTED Final    Comment: Performed at [facility], [address] [place], [facility] [address]  Surgical pcr screen     Status: None   Collection Time: 11/29/21  3:59 PM   Specimen: Nasal Mucosa; Nasal Swab  Result Value Ref Range Status   MRSA, PCR NEGATIVE NEGATIVE Final   Staphylococcus aureus NEGATIVE NEGATIVE Final    Comment: (NOTE) The Xpert SA Assay (FDA approved for NASAL specimens in patients 53 years of age and older), is one component of a comprehensive surveillance program. It is not intended to  diagnose infection nor to guide or monitor treatment. Performed at [facility], [address], [place], [place] [address]          Radiology Studies: ECHOCARDIOGRAM COMPLETE  Result Date: 11/29/2021    ECHOCARDIOGRAM REPORT   Patient Name:   KEONDRICK DILKS Date of Exam: 11/29/2021 Medical Rec #:  [id number]       Height:       60.0 in Accession #:    [id number]      Weight:       115.7 lb Date of Birth:  10-02-1965        BSA:          1.479 m Patient Age:    55 years        BP:           154/106 mmHg Patient Gender: M               HR:           83 bpm. Exam Location:  [facility] Procedure: 2D Echo, Cardiac Doppler and Color Doppler Indications:     R78.81 Bacteremia  History:         Patient has no prior history of Echocardiogram examinations.                  Risk Factors:Current Smoker, Diabetes, Hypertension and                  Polysubstance abuse.  Sonographer:     Ceasar Mons Referring Phys:  [id number] AGBATA Diagnosing Phys: Yvonne Kendall MD  Sonographer Comments: Suboptimal apical window. Image acquisition challenging due to patient body habitus. IMPRESSIONS  1. Left ventricular ejection fraction, by estimation, is 60 to 65%. The left ventricle has normal function. The left ventricle has no regional wall motion  abnormalities. Left ventricular diastolic parameters are indeterminate.  2. Right ventricular systolic function is normal. The right ventricular size is normal.  3. The mitral valve is normal in structure. Trivial mitral valve regurgitation. No evidence of mitral stenosis.  4. The aortic valve is tricuspid. Aortic valve regurgitation is not visualized. No aortic stenosis is present.  5. The inferior vena cava is normal in size with greater than 50% respiratory variability, suggesting right atrial pressure of 3 mmHg. FINDINGS  Left Ventricle: Left ventricular ejection fraction, by estimation, is 60 to 65%. The left ventricle has normal function. The left ventricle has no regional wall motion abnormalities. The left ventricular internal cavity size was normal in size. There is  borderline left ventricular hypertrophy. Left ventricular diastolic parameters are indeterminate. Right Ventricle: The right ventricular size is normal. No increase in right ventricular wall thickness. Right ventricular systolic function is normal. Left Atrium: Left atrial size was normal in size. Right Atrium: Right atrial size was normal in size. Pericardium: There is no evidence of pericardial effusion. Mitral Valve: The mitral valve is normal in structure. Trivial mitral valve regurgitation. No evidence of mitral valve stenosis. Tricuspid Valve: The tricuspid valve is normal in structure. Tricuspid valve regurgitation is trivial. Aortic Valve: The aortic valve is tricuspid. Aortic valve regurgitation is not visualized. No aortic stenosis is present. Aortic valve mean gradient measures 2.0 mmHg. Aortic valve peak gradient measures 3.7 mmHg. Aortic valve area, by VTI measures 2.45 cm. Pulmonic Valve: The pulmonic valve was normal in structure. Pulmonic valve regurgitation is not visualized. No evidence of pulmonic stenosis. Aorta: The aortic root is normal in size and structure. Pulmonary Artery:  The pulmonary artery is of normal size.  Venous: The inferior vena cava is normal in size with greater than 50% respiratory variability, suggesting right atrial pressure of 3 mmHg. IAS/Shunts: No atrial level shunt detected by color flow Doppler.  LEFT VENTRICLE PLAX 2D LVIDd:         3.69 cm   Diastology LVIDs:         2.64 cm   LV e' medial:    9.03 cm/s LV PW:         0.99 cm   LV E/e' medial:  6.7 LV IVS:        1.09 cm   LV e' lateral:   9.14 cm/s LVOT diam:     1.80 cm   LV E/e' lateral: 6.6 LV SV:         46 LV SV Index:   31 LVOT Area:     2.54 cm  RIGHT VENTRICLE RV Basal diam:  3.17 cm RV S prime:     16.00 cm/s TAPSE (M-mode): 3.1 cm LEFT ATRIUM           Index        RIGHT ATRIUM          Index LA diam:      2.70 cm 1.83 cm/m   RA Area:     8.48 cm LA Vol (A4C): 36.7 ml 24.81 ml/m  RA Volume:   17.30 ml 11.70 ml/m  AORTIC VALVE AV Area (Vmax):    1.89 cm AV Area (Vmean):   1.81 cm AV Area (VTI):     2.45 cm AV Vmax:           96.50 cm/s AV Vmean:          69.000 cm/s AV VTI:            0.186 m AV Peak Grad:      3.7 mmHg AV Mean Grad:      2.0 mmHg LVOT Vmax:         71.70 cm/s LVOT Vmean:        49.000 cm/s LVOT VTI:          0.179 m LVOT/AV VTI ratio: 0.96  AORTA Ao Root diam: 3.40 cm MITRAL VALVE MV Area (PHT): 2.34 cm    SHUNTS MV Decel Time: 324 msec    Systemic VTI:  0.18 m MV E velocity: 60.70 cm/s  Systemic Diam: 1.80 cm MV A velocity: 91.30 cm/s MV E/A ratio:  0.66 Cristal Deerhristopher End MD Electronically signed by Yvonne Kendallhristopher End MD Signature Date/Time: 11/29/2021/3:50:04 PM    Final    DG Chest Portable 1 View  Result Date: 11/29/2021 CLINICAL DATA:  IVDU, eval infiltrate.  Right flank pain. EXAM: PORTABLE CHEST 1 VIEW COMPARISON:  Abdominal CT earlier today.  Chest x-ray 02/09/2020 FINDINGS: Previously seen tree-in-bud densities on CT cannot be appreciated by plain film. No visible airspace opacities. No effusions. Heart is normal size. No acute bony abnormality. IMPRESSION: No active disease. Electronically Signed   By: Charlett NoseKevin   Dover M.D.   On: 11/29/2021 03:33   CT Renal Stone Study  Result Date: 11/29/2021 CLINICAL DATA:  Right-sided flank pain EXAM: CT ABDOMEN AND PELVIS WITHOUT CONTRAST TECHNIQUE: Multidetector CT imaging of the abdomen and pelvis was performed following the standard protocol without IV contrast. RADIATION DOSE REDUCTION: This exam was performed according to the departmental dose-optimization program which includes automated exposure control, adjustment of the mA and/or kV according to patient size and/or use  of iterative reconstruction technique. COMPARISON:  CT 10/28/2013 FINDINGS: Lower chest: Lung bases demonstrate mild bronchiectasis at the lingula. Mild tree-in-bud density at the lingula and left greater than right lung base. No acute consolidation or effusion. Hepatobiliary: Contracted gallbladder. No calcified stone or biliary dilatation Pancreas: Unremarkable. No pancreatic ductal dilatation or surrounding inflammatory changes. Spleen: Normal in size without focal abnormality. Adrenals/Urinary Tract: Adrenal glands are normal. No significant hydronephrosis. Examination is significantly limited by paucity of intra-abdominal fat. Right kidney appears enlarged compared to the left, suspicion mild perinephric edema. Prominent right renal pelvis, without definitive ureteral stone. The bladder is unremarkable Stomach/Bowel: The stomach is nonenlarged. There is no dilated small bowel. No acute bowel wall thickening Vascular/Lymphatic: Advanced aortic atherosclerosis. No aneurysm. No suspicious lymph nodes Reproductive: Prostate is unremarkable. Other: Negative for pelvic effusion or free air. Musculoskeletal: No acute or significant osseous findings. IMPRESSION: 1. Very limited study secondary to paucity of intra-abdominal fat. 2. Asymmetrically enlarged right kidney with suspicion of perinephric edema. Some renal pelvis dilatation without convincing obstructing stone, findings could be secondary to  pyelonephritis. 3. Mild tree-in-bud density at the lingula and lung bases consistent with respiratory infection, potentially due to atypical organism Electronically Signed   By: Jasmine Pang M.D.   On: 11/29/2021 02:47        Scheduled Meds:  acetaminophen  650 mg Oral Q6H   amLODipine  10 mg Oral Daily   baclofen  5 mg Oral TID   celecoxib  100 mg Oral BID   enoxaparin (LOVENOX) injection  40 mg Subcutaneous Q24H   insulin aspart  0-15 Units Subcutaneous TID WC   insulin aspart  0-5 Units Subcutaneous QHS   insulin aspart  4 Units Subcutaneous TID WC   insulin detemir  15 Units Subcutaneous Daily   multivitamin with minerals  1 tablet Oral Daily   potassium chloride  40 mEq Oral TID   Continuous Infusions:  ceFEPime (MAXIPIME) IV 2 g (11/30/21 0531)     LOS: 1 day    Time spent: 40 minutes    Dorcas Carrow, MD [facility] Pager [phone]

## 2021-11-30 NOTE — Progress Notes (Signed)
11/30/2021 at 0520:  Pt refused lab draw.

## 2021-11-30 NOTE — Progress Notes (Signed)
Initial Nutrition Assessment  DOCUMENTATION CODES:   Severe malnutrition in context of chronic illness  INTERVENTION:   Nepro Shake po TID, each supplement provides 425 kcal and 19 grams protein  Double protein on meal trays  MVI po daily   Liberalize diet   Pt at high refeed risk; recommend monitor potassium, magnesium and phosphorus labs daily until stable  NUTRITION DIAGNOSIS:   Severe Malnutrition related to chronic illness (uncontrolled DM, substance abuse, homelessness) as evidenced by severe fat depletion, severe muscle depletion.  GOAL:   Patient will meet greater than or equal to 90% of their needs  MONITOR:   PO intake, Supplement acceptance, Labs, Weight trends, Skin, I & O's  REASON FOR ASSESSMENT:   Consult Assessment of nutrition requirement/status  ASSESSMENT:   56 y/o homeless male with h/o uncontrolled DM and substance abuse who is admitted with sepsis and bacteremia secondary to pyelonephritis.  Met with pt and family member in room today. Pt reports good appetite and oral intake at baseline but reports that he is homeless and has been living out of his car. Pt reports that he eats whatever he can get his hands on. Pt is asking for protein shakes today along with more food as he reports that he needs to gain weight. Pt reports that he has lost over 100lbs in a little over a year; pt reports his UBW is ~215lbs. Per chart, pt is down 14lbs(11%) since October; RD unsure how recently weight loss occurred. RD looked back to 2016 and the highest weight noted in chart was 155lbs. RD will add supplements (prefers vanilla) along with double protein on meal trays to help pt meet his estimated needs. RD will also liberalize pt's diet. Pt documented to be eating 75-100% of meals in hospital. Pt is at high refeed risk.   Medications reviewed and include: lovenox, insulin, MVI, KCl, cefepime   Labs reviewed: Na 131(L), K 2.7(L), creat 0.50(L) Wbc- 15.3(H) Cbgs- 323,  219 x 24 hrs AIC 13.2(H)- 7/11  NUTRITION - FOCUSED PHYSICAL EXAM:  Flowsheet Row Most Recent Value  Orbital Region Severe depletion  Upper Arm Region Severe depletion  Thoracic and Lumbar Region Severe depletion  Buccal Region Severe depletion  Temple Region Severe depletion  Clavicle Bone Region Severe depletion  Clavicle and Acromion Bone Region Severe depletion  Scapular Bone Region Severe depletion  Dorsal Hand Severe depletion  Patellar Region Severe depletion  Anterior Thigh Region Severe depletion  Posterior Calf Region Severe depletion  Edema (RD Assessment) None  Hair Reviewed  Eyes Reviewed  Mouth Reviewed  Skin Reviewed  Nails Reviewed   Diet Order:   Diet Order             Diet Carb Modified Fluid consistency: Thin; Room service appropriate? Yes  Diet effective now                  EDUCATION NEEDS:   Education needs have been addressed  Skin:  Skin Assessment: Reviewed RN Assessment  Last BM:  7/10  Height:   Ht Readings from Last 1 Encounters:  11/29/21 5' (1.524 m)    Weight:   Wt Readings from Last 1 Encounters:  11/29/21 52.5 kg    Ideal Body Weight:  45.45 kg  BMI:  Body mass index is 22.6 kg/m.  Estimated Nutritional Needs:   Kcal:  1700-1900kcal/day  Protein:  85-95g/day  Fluid:  1.7-1.9L/day  Koleen Distance MS, RD, LDN Please refer to [facility] for RD and/or RD on-call/weekend/after  hours pager

## 2021-11-30 NOTE — Inpatient Diabetes Management (Addendum)
Inpatient Diabetes Program Recommendations  AACE/ADA: New Consensus Statement on Inpatient Glycemic Control  Target Ranges:  Prepandial:   less than 140 mg/dL      Peak postprandial:   less than 180 mg/dL (1-2 hours)      Critically ill patients:  140 - 180 mg/dL    Latest Reference Range & Units 11/30/21 08:30  Glucose-Capillary 70 - 99 mg/dL 161 (H)    Latest Reference Range & Units 11/29/21 11:39 11/29/21 16:59 11/29/21 20:23  Glucose-Capillary 70 - 99 mg/dL 096 (H) 045 (H) 409 (H)    Latest Reference Range & Units 02/09/20 03:55 10/19/20 16:44 11/29/21 14:02  Hemoglobin A1C 4.8 - 5.6 % >15.5 (H) 14.4 (H) 13.2 (H)   Review of Glycemic Control  Diabetes history: DM2 hx (dx in September 2021) Outpatient Diabetes medications: None; was prescribed Humalog 75/25 30 units BID Current orders for Inpatient glycemic control: Levemir 15 units daily, Novolog 0-9 units TID with meals  Inpatient Diabetes Program Recommendations:    Insulin: Please consider increasing Novolog correction to 0-15 units AC&HS and consider ordering Novolog 4 units TID with meals for meal coverage if patient eats at least 50% of meals.  Addendum 11/30/21@11 :36- Spoke with patient, his girlfriend, his sister Sharen Heck) and a cousin at bedside. Patient provided permission to talk with everyone in the room. Patient was initially in bed covered up from head to toe lying on his side. Had to ask him several times to wake and talk with me. Patient reports that he lives in his car, he has no money, he admits to using drugs (reports last used about 1 week ago), and he does not check glucose or take insulin consistently. Patient stated several times that he was "so little, I need to gain weight."  Patient's girlfriend reports that when patient was discharged from the hospital in September 2021 that he got Humalog 75/25 insulin and he used it sporadically until he ran out. Patient's mother recently passed away in 11-19-22 and she  used Lantus, Humalog, and Humalog 75/25 insulin so his sister gave the insulin to him to use. Patient's girlfriend reports that patient was afraid to take the Lantus (fear of hypoglycemia) and would take the Humalog once every couple days. Explained to patient that he needs to take insulin consistently as prescribed. Discussed Lantus, Humalog, and Humalog 75/25 (as well as Novolog 70/30) in detail and how each insulin works. Discussed importance of keeping insulin cool. Patient has a cooler in his car but sometimes runs out of ice. Discussed perhaps patient could keep one or two insulin pens in the cooler in the car and allow his sister to store the other pens in her refrigerator until he needs more insulin to ensure insulin stability. Discussed basic pathophysiology of DM and importance of taking insulin consistently to ensure cells are getting needed glucose which would help him to gain weight.  Discussed current A1C of 13.2% on 11/29/21 indicating an average glucose of 332 mg/dl over the past 2-3 months. Discussed glucose and A1C goals. Patient's sister asked about patient getting supplement like Ensure. Informed patient and his family that RD consult would be requested and RD could assess nutritional needs. Discussed in detail the complications from uncontrolled DM and stressed that he needs to get DM under better control.  Explained to patient that if he is able to get medications at [facility] that he will need to establish care with a clinic to ensure he can continue to  get needed prescriptions. Patient's sister asked if she could talk with someone with [facility] about getting an application for [facility] so she can fill it out for patient. Patient's sister would also like to see if she can get patient signed up for Medicaid and disability. Informed patient's sister that I would send a message to [facility] along with her phone number to have them reach out  to her. Patient verbalized understanding of information and states he has no questions at this time related to DM. Patient reported that he wanted me to let his nurse know that he is in pain and needs pain medication and he is hungry and would like ice cream. Explained that given his glucose was 219 mg/dl this morning, he may need a different snack other than ice cream. Encouraged patient to go ahead and call to order his lunch since it was 11:25. Talked with Marilynne Halsted, RN regarding patient's report of pain and request for pain medication; also that patient requested ice cream and was advised to call and order lunch.  Thanks, [name], RN, MSN, CDCES Diabetes Coordinator Inpatient Diabetes Program [phone] (Team Pager from 8am to 5pm)

## 2021-11-30 NOTE — Progress Notes (Signed)

## 2021-11-30 NOTE — Progress Notes (Signed)
Notified MD of critical potassium of 2.7.

## 2021-11-30 NOTE — Progress Notes (Signed)
Patient in bathroom upon RN arrival in room for 1600 med pass. Patient came out of bathroom with a strong smell of smoke. Patient stated he was smoking a cigarette in bathroom but doesn't have anymore left. RN searched belongings, room and bathroom. MD made aware. Two lighters and scissors taken from patient and RN explained that he will get them back when he discharges.

## 2021-12-01 LAB — GLUCOSE, CAPILLARY
Glucose-Capillary: 299 mg/dL — ABNORMAL HIGH (ref 70–99)
Glucose-Capillary: 380 mg/dL — ABNORMAL HIGH (ref 70–99)
Glucose-Capillary: 193 mg/dL — ABNORMAL HIGH (ref 70–99)

## 2021-12-01 LAB — CBC WITH DIFFERENTIAL/PLATELET
Abs Immature Granulocytes: 0.08 10*3/uL — ABNORMAL HIGH (ref 0.00–0.07)
Basophils Absolute: 0 10*3/uL (ref 0.0–0.1)
Basophils Relative: 0 %
Eosinophils Absolute: 0.2 10*3/uL (ref 0.0–0.5)
Eosinophils Relative: 1 %
HCT: 37.1 % — ABNORMAL LOW (ref 39.0–52.0)
Hemoglobin: 12.6 g/dL — ABNORMAL LOW (ref 13.0–17.0)
Immature Granulocytes: 1 %
Lymphocytes Relative: 19 %
Lymphs Abs: 2.5 10*3/uL (ref 0.7–4.0)
MCH: 29.6 pg (ref 26.0–34.0)
MCHC: 34 g/dL (ref 30.0–36.0)
MCV: 87.1 fL (ref 80.0–100.0)
Monocytes Absolute: 0.6 10*3/uL (ref 0.1–1.0)
Monocytes Relative: 5 %
Neutro Abs: 9.8 10*3/uL — ABNORMAL HIGH (ref 1.7–7.7)
Neutrophils Relative %: 74 %
Platelets: 261 10*3/uL (ref 150–400)
RBC: 4.26 MIL/uL (ref 4.22–5.81)
RDW: 12.4 % (ref 11.5–15.5)
WBC: 13.2 10*3/uL — ABNORMAL HIGH (ref 4.0–10.5)
nRBC: 0 % (ref 0.0–0.2)

## 2021-12-01 LAB — BASIC METABOLIC PANEL
Anion gap: 5 (ref 5–15)
BUN: 12 mg/dL (ref 6–20)
CO2: 25 mmol/L (ref 22–32)
Calcium: 8.2 mg/dL — ABNORMAL LOW (ref 8.9–10.3)
Chloride: 100 mmol/L (ref 98–111)
Creatinine, Ser: 0.59 mg/dL — ABNORMAL LOW (ref 0.61–1.24)
GFR, Estimated: >60 mL/min (ref >60)
Glucose, Bld: 238 mg/dL — ABNORMAL HIGH (ref 70–99)
Potassium: 4.3 mmol/L (ref 3.5–5.1)
Sodium: 130 mmol/L — ABNORMAL LOW (ref 135–145)

## 2021-12-01 LAB — CULTURE, BLOOD (ROUTINE X 2): Special Requests: ADEQUATE

## 2021-12-01 LAB — MAGNESIUM: Magnesium: 1.9 mg/dL (ref 1.7–2.4)

## 2021-12-01 LAB — PHOSPHORUS: Phosphorus: 2.3 mg/dL — ABNORMAL LOW (ref 2.5–4.6)

## 2021-12-01 MED ORDER — INSULIN DETEMIR 100 UNIT/ML ~~LOC~~ SOLN
20.0000 [IU] | Freq: Every day | SUBCUTANEOUS | Status: DC
  Administered 2021-12-01: 20 [IU] via SUBCUTANEOUS
  Filled 2021-12-01 (×2): qty 0.2

## 2021-12-01 MED ORDER — SULFAMETHOXAZOLE-TRIMETHOPRIM 800-160 MG PO TABS
1.0000 | ORAL_TABLET | Freq: Two times a day (BID) | ORAL | Status: DC
  Administered 2021-12-02: 1 via ORAL
  Filled 2021-12-01: qty 1

## 2021-12-01 NOTE — Progress Notes (Signed)
PROGRESS NOTE    Earl Moreno  MWU:[id number] DOB: 01-22-1966 DOA: 11/29/2021 PCP: Pcp, No    Brief Narrative:  56 year old gentleman, homeless, heroin user and smoker, history of insulin-dependent diabetes presented to the ER with right flank pain, dysuria frequency and hematuria ongoing for more than 1 month.  He is homeless and lives with his girlfriend in a car.  His family found him yesterday and he looked very unwell so brought to the ER.  In the emergency room temperature 100.3.  CT scan abdomen pelvis asymmetrically enlarged right kidney and perinephric edema, renal stones but no obstruction.  Cultures were drawn and patient admitted with IV antibiotics.   Assessment & Plan:   Sepsis present on admission secondary to acute pyelonephritis and gram-negative bacteremia: Clinically improving Blood cultures with Enterococcus aerogenes, final sensitivity pending. Urine cultures not collected.  UA grossly infected. CT scan with kidney stones but there is no obstruction or hydronephrosis. Continue cefepime today.  Insulin-dependent type 2 diabetes, uncontrolled with hyperglycemia: Hemoglobin A1c more than 15.  Homeless, no access to medications, not using any medications. Increase Levemir to 25 units daily.  Continue prandial insulin.  Case management to help with charity care.  Hopefully we can discharge him on insulin 70/30.    Hyponatremia: Stable.  Secondary to high blood glucose.  Hypokalemia: Severe.  Replaced aggressively.  Recheck today.  Polysubstance abuse, heroin addiction: Last used a week ago.  No evidence of withdrawals.  Generalized pain issue. Tylenol, Celebrex and baclofen as scheduled.  Avoid IV narcotics.   DVT prophylaxis: enoxaparin (LOVENOX) injection 40 mg Start: 11/29/21 2200   Code Status: Full code Family Communication: Girlfriend at the bedside. Disposition Plan: Status is: Inpatient Remains inpatient appropriate because: Bacteremia on IV  antibiotics, uncontrolled blood sugars     Consultants:  None  Procedures:  None  Antimicrobials:  Cefepime 7/11---   Subjective: Seen and examined.  Sleeping sound, wakes up and asked for pain medicine.  Looks comfortable.  Urine is clear.  Objective: Vitals:   11/30/21 1728 11/30/21 1945 12/01/21 0337 12/01/21 0819  BP: (!) 151/98 128/74 (!) 156/98 (!) 159/104  Pulse: 73 72 60 85  Resp: 15 16 17 16   Temp: 98 F (36.7 C) 97.7 F (36.5 C) 98.1 F (36.7 C) 99 F (37.2 C)  TempSrc:      SpO2: 100% 99% 99% 98%  Weight:      Height:        Intake/Output Summary (Last 24 hours) at 12/01/2021 1531 Last data filed at 12/01/2021 1416 Gross per 24 hour  Intake 812.89 ml  Output --  Net 812.89 ml   Filed Weights   11/29/21 0310  Weight: 52.5 kg    Examination:  General exam: Appears calm and comfortable.  Mostly sleepy.  Cachectic.  Frail and debilitated. Respiratory system: No added sounds. Cardiovascular system: S1 & S2 heard, RRR. No JVD, murmurs, rubs, gallops or clicks. No pedal edema. Gastrointestinal system: Abdomen is nondistended, soft and nontender. No organomegaly or masses felt. Normal bowel sounds heard. No evidence of costophrenic tenderness. Central nervous system: Alert and oriented. No focal neurological deficits. Extremities: Symmetric 5 x 5 power.   Data Reviewed: I have personally reviewed following labs and imaging studies  CBC: Recent Labs  Lab 11/28/21 2032 11/30/21 0828  WBC 17.2* 15.3*  NEUTROABS 14.8*  --   HGB 13.9 13.1  HCT 39.8 37.6*  MCV 86.7 86.0  PLT 237 230   Basic Metabolic Panel: Recent Labs  Lab 11/28/21 2032 11/30/21 0828  NA 123* 131*  K 3.7 2.7*  CL 83* 98  CO2 28 24  GLUCOSE 426* 225*  BUN 14 10  CREATININE 0.67 0.50*  CALCIUM 8.7* 8.1*   GFR: Estimated Creatinine Clearance: 72.9 mL/min (A) (by C-G formula based on SCr of 0.5 mg/dL (L)). Liver Function Tests: Recent Labs  Lab 11/28/21 2032  AST 16   ALT 18  ALKPHOS 183*  BILITOT 0.9  PROT 8.4*  ALBUMIN 2.9*   No results for input(s): "LIPASE", "AMYLASE" in the last 168 hours. No results for input(s): "AMMONIA" in the last 168 hours. Coagulation Profile: No results for input(s): "INR", "PROTIME" in the last 168 hours. Cardiac Enzymes: No results for input(s): "CKTOTAL", "CKMB", "CKMBINDEX", "TROPONINI" in the last 168 hours. BNP (last 3 results) No results for input(s): "PROBNP" in the last 8760 hours. HbA1C: Recent Labs    11/29/21 1402  HGBA1C 13.2*   CBG: Recent Labs  Lab 11/30/21 1143 11/30/21 1730 11/30/21 2035 12/01/21 0825 12/01/21 1223  GLUCAP 323* 260* 357* 299* 380*   Lipid Profile: No results for input(s): "CHOL", "HDL", "LDLCALC", "TRIG", "CHOLHDL", "LDLDIRECT" in the last 72 hours. Thyroid Function Tests: No results for input(s): "TSH", "T4TOTAL", "FREET4", "T3FREE", "THYROIDAB" in the last 72 hours. Anemia Panel: No results for input(s): "VITAMINB12", "FOLATE", "FERRITIN", "TIBC", "IRON", "RETICCTPCT" in the last 72 hours. Sepsis Labs: Recent Labs  Lab 11/29/21 0219 11/29/21 1402 11/29/21 1828 11/29/21 2208 11/30/21 0828  PROCALCITON 1.25  --   --   --  0.68  LATICACIDVEN 1.6 2.5* 2.1* 1.7  --     Recent Results (from the past 240 hour(s))  Blood culture (routine x 2)     Status: Abnormal   Collection Time: 11/29/21  2:19 AM   Specimen: BLOOD  Result Value Ref Range Status   Specimen Description   Final    BLOOD RIGHT ANTECUBITAL Performed at [facility], [address] [place], [place] [address]    Special Requests   Final    BOTTLES DRAWN AEROBIC AND ANAEROBIC Blood Culture adequate volume Performed at [facility], [address] [place], [place] [address]    Culture  Setup Time   Final    Organism ID to follow GRAM NEGATIVE RODS IN BOTH AEROBIC AND ANAEROBIC BOTTLES CRITICAL RESULT CALLED TO, READ BACK BY AND VERIFIED WITH: KISHAN PATEL @1626  11/29/21  MJU Performed at [facility], [address] [place], [place]    Culture ENTEROBACTER AEROGENES (A)  Final   Report Status 12/01/2021 FINAL  Final   Organism ID, Bacteria ENTEROBACTER AEROGENES  Final      Susceptibility   Enterobacter aerogenes - MIC*    CEFAZOLIN >=64 RESISTANT Resistant     CEFEPIME <=0.12 SENSITIVE Sensitive     CEFTAZIDIME <=1 SENSITIVE Sensitive     CEFTRIAXONE <=0.25 SENSITIVE Sensitive     CIPROFLOXACIN <=0.25 SENSITIVE Sensitive     GENTAMICIN <=1 SENSITIVE Sensitive     IMIPENEM 1 SENSITIVE Sensitive     TRIMETH/SULFA <=20 SENSITIVE Sensitive     PIP/TAZO <=4 SENSITIVE Sensitive     * ENTEROBACTER AEROGENES  Blood culture (routine x 2)     Status: Abnormal (Preliminary result)   Collection Time: 11/29/21  2:19 AM   Specimen: BLOOD  Result Value Ref Range Status   Specimen Description   Final    BLOOD BLOOD RIGHT FOREARM Performed at [facility], [address] [place],  [place] [address]    Special Requests   Final    BOTTLES DRAWN AEROBIC AND ANAEROBIC Blood Culture results may not be optimal due to an excessive volume of blood received in culture bottles Performed at [facility], [address] [place], [place] [address]    Culture  Setup Time   Final    IN BOTH AEROBIC AND ANAEROBIC BOTTLES GRAM NEGATIVE RODS CRITICAL RESULT CALLED TO, READ BACK BY AND VERIFIED WITH: Kevin Fenton PATEL @1626  11/29/21 MJU    Culture (A)  Final    ENTEROBACTER AEROGENES SUSCEPTIBILITIES PERFORMED ON PREVIOUS CULTURE WITHIN THE LAST 5 DAYS. Performed at [name], [address] [place], [place]    Report Status PENDING  Incomplete  Blood Culture ID Panel (Reflexed)     Status: Abnormal   Collection Time: 11/29/21  2:19 AM  Result Value Ref Range Status   Enterococcus faecalis NOT DETECTED NOT DETECTED Final   Enterococcus Faecium NOT DETECTED NOT DETECTED Final   Listeria monocytogenes NOT DETECTED NOT  DETECTED Final   Staphylococcus species NOT DETECTED NOT DETECTED Final   Staphylococcus aureus (BCID) NOT DETECTED NOT DETECTED Final   Staphylococcus epidermidis NOT DETECTED NOT DETECTED Final   Staphylococcus lugdunensis NOT DETECTED NOT DETECTED Final   Streptococcus species NOT DETECTED NOT DETECTED Final   Streptococcus agalactiae NOT DETECTED NOT DETECTED Final   Streptococcus pneumoniae NOT DETECTED NOT DETECTED Final   Streptococcus pyogenes NOT DETECTED NOT DETECTED Final   A.calcoaceticus-baumannii NOT DETECTED NOT DETECTED Final   Bacteroides fragilis NOT DETECTED NOT DETECTED Final   Enterobacterales DETECTED (A) NOT DETECTED Final    Comment: Enterobacterales represent a large order of gram negative bacteria, not a single organism. CRITICAL RESULT CALLED TO, READ BACK BY AND VERIFIED WITH: 01/30/22 PATEL @1626  11/29/21 MJU    Enterobacter cloacae complex NOT DETECTED NOT DETECTED Final   Escherichia coli NOT DETECTED NOT DETECTED Final   Klebsiella aerogenes DETECTED (A) NOT DETECTED Final    Comment: CRITICAL RESULT CALLED TO, READ BACK BY AND VERIFIED WITH: PATEL @1626  11/29/21 MJU    Klebsiella oxytoca NOT DETECTED NOT DETECTED Final   Klebsiella pneumoniae NOT DETECTED NOT DETECTED Final   Proteus species NOT DETECTED NOT DETECTED Final   Salmonella species NOT DETECTED NOT DETECTED Final   Serratia marcescens NOT DETECTED NOT DETECTED Final   Haemophilus influenzae NOT DETECTED NOT DETECTED Final   Neisseria meningitidis NOT DETECTED NOT DETECTED Final   Pseudomonas aeruginosa NOT DETECTED NOT DETECTED Final   Stenotrophomonas maltophilia NOT DETECTED NOT DETECTED Final   Candida albicans NOT DETECTED NOT DETECTED Final   Candida auris NOT DETECTED NOT DETECTED Final   Candida glabrata NOT DETECTED NOT DETECTED Final   Candida krusei NOT DETECTED NOT DETECTED Final   Candida parapsilosis NOT DETECTED NOT DETECTED Final   Candida tropicalis NOT DETECTED NOT  DETECTED Final   Cryptococcus neoformans/gattii NOT DETECTED NOT DETECTED Final   CTX-M ESBL NOT DETECTED NOT DETECTED Final   Carbapenem resistance IMP NOT DETECTED NOT DETECTED Final   Carbapenem resistance KPC NOT DETECTED NOT DETECTED Final   Carbapenem resistance NDM NOT DETECTED NOT DETECTED Final   Carbapenem resist OXA 48 LIKE NOT DETECTED NOT DETECTED Final   Carbapenem resistance VIM NOT DETECTED NOT DETECTED Final    Comment: Performed at [facility], [address] [place], [facility] [address]  Surgical pcr screen     Status: None   Collection Time: 11/29/21  3:59  PM   Specimen: Nasal Mucosa; Nasal Swab  Result Value Ref Range Status   MRSA, PCR NEGATIVE NEGATIVE Final   Staphylococcus aureus NEGATIVE NEGATIVE Final    Comment: (NOTE) The Xpert SA Assay (FDA approved for NASAL specimens in patients 31 years of age and older), is one component of a comprehensive surveillance program. It is not intended to diagnose infection nor to guide or monitor treatment. Performed at [facility], [address] [place], [place] [address]          Radiology Studies: No results found.      Scheduled Meds:  acetaminophen  650 mg Oral Q6H   amLODipine  10 mg Oral Daily   baclofen  5 mg Oral TID   celecoxib  100 mg Oral BID   Chlorhexidine Gluconate Cloth  6 each Topical Daily   enoxaparin (LOVENOX) injection  40 mg Subcutaneous Q24H   feeding supplement (NEPRO CARB STEADY)  237 mL Oral TID BM   insulin aspart  0-15 Units Subcutaneous TID WC   insulin aspart  0-5 Units Subcutaneous QHS   insulin aspart  4 Units Subcutaneous TID WC   insulin detemir  20 Units Subcutaneous Daily   multivitamin with minerals  1 tablet Oral Daily   sodium chloride flush  10-40 mL Intracatheter Q12H   [START ON 12/02/2021] sulfamethoxazole-trimethoprim  1 tablet Oral Q12H   Continuous Infusions:  sodium chloride 10 mL/hr at 11/30/21 1513   ceFEPime (MAXIPIME) IV 2 g  (12/01/21 1356)     LOS: 2 days    Time spent: 35 minutes    Dorcas Carrow, MD [facility] Pager [phone]

## 2021-12-02 ENCOUNTER — Other Ambulatory Visit: Payer: Self-pay

## 2021-12-02 LAB — GLUCOSE, CAPILLARY
Glucose-Capillary: 430 mg/dL — ABNORMAL HIGH (ref 70–99)
Glucose-Capillary: 381 mg/dL — ABNORMAL HIGH (ref 70–99)

## 2021-12-02 MED ORDER — AMLODIPINE BESYLATE 10 MG PO TABS
10.0000 mg | ORAL_TABLET | Freq: Every day | ORAL | 1 refills | Status: DC
  Filled 2021-12-02: qty 30, 30d supply, fill #0

## 2021-12-02 MED ORDER — RIGHTEST GM550 BLOOD GLUCOSE W/DEVICE KIT
1.0000 | PACK | 0 refills | Status: DC
  Filled 2021-12-02: qty 1, 1d supply, fill #0

## 2021-12-02 MED ORDER — BACLOFEN 5 MG PO TABS
5.0000 mg | ORAL_TABLET | Freq: Three times a day (TID) | ORAL | 0 refills | Status: DC | PRN
  Filled 2021-12-02: qty 30, 10d supply, fill #0

## 2021-12-02 MED ORDER — SULFAMETHOXAZOLE-TRIMETHOPRIM 800-160 MG PO TABS
1.0000 | ORAL_TABLET | Freq: Two times a day (BID) | ORAL | 0 refills | Status: AC
  Filled 2021-12-02: qty 14, 7d supply, fill #0

## 2021-12-02 MED ORDER — RIGHTEST GL300 LANCETS MISC
99 refills | Status: DC
  Filled 2021-12-02: qty 100, 25d supply, fill #0

## 2021-12-02 MED ORDER — INSULIN LISPRO PROT & LISPRO (75-25 MIX) 100 UNIT/ML KWIKPEN
18.0000 [IU] | PEN_INJECTOR | Freq: Two times a day (BID) | SUBCUTANEOUS | 11 refills | Status: DC
  Filled 2021-12-02: qty 15, 42d supply, fill #0

## 2021-12-02 MED ORDER — CELECOXIB 100 MG PO CAPS
100.0000 mg | ORAL_CAPSULE | Freq: Two times a day (BID) | ORAL | 0 refills | Status: AC
  Filled 2021-12-02: qty 14, 7d supply, fill #0

## 2021-12-02 MED ORDER — INSULIN ASPART PROT & ASPART (70-30 MIX) 100 UNIT/ML ~~LOC~~ SUSP
18.0000 [IU] | Freq: Two times a day (BID) | SUBCUTANEOUS | Status: DC
  Administered 2021-12-02: 18 [IU] via SUBCUTANEOUS
  Filled 2021-12-02: qty 10

## 2021-12-02 MED ORDER — RIGHTEST GS550 BLOOD GLUCOSE VI STRP
ORAL_STRIP | 0 refills | Status: DC
  Filled 2021-12-02: qty 100, 25d supply, fill #0

## 2021-12-02 MED ORDER — COMFORT EZ PEN NEEDLES 32G X 4 MM MISC
1.0000 | Freq: Two times a day (BID) | 1 refills | Status: DC
  Filled 2021-12-02: qty 100, 50d supply, fill #0

## 2021-12-02 NOTE — Discharge Summary (Signed)
Physician Discharge Summary  Earl Moreno DVV:[id number] DOB: March 06, 1966 DOA: 11/29/2021  PCP: Pcp, No  Admit date: 11/29/2021 Discharge date: 12/02/2021  Admitted From: Home Disposition: Home, patient is homeless and lives with his girlfriend in a car.  Recommendations for Outpatient Follow-up:  Follow up with PCP in 1-2 weeks Please obtain BMP/CBC in one week   Home Health: N/A Equipment/Devices: N/A  Discharge Condition: Stable CODE STATUS: Full code Diet recommendation: Low-salt and low-carb diet  Discharge summary: 56 year old gentleman, homeless, heroin user and smoker, history of insulin-dependent diabetes presented to the ER with right flank pain, dysuria, frequency and hematuria ongoing for more than 1 month.  He is homeless and lives with his girlfriend in a car.  His family found him yesterday and he looked very unwell so brought to the ER.  In the emergency room temperature 100.3.  CT scan abdomen pelvis asymmetrically enlarged right kidney and perinephric edema, renal stones but no obstruction.  Cultures were drawn and patient admitted with IV antibiotics.  Patient was found to have Enterococcus bacteremia     Assessment & plan of care:   Sepsis present on admission secondary to acute pyelonephritis and gram-negative bacteremia: Clinically improving Blood cultures with Enterobacter aerogenes and pansensitive. Urine cultures not collected.  UA grossly infected. CT scan with kidney stones but there is no obstruction or hydronephrosis. Treated with cefepime.  Clinically improved.  We will continue Bactrim for 7 additional days to complete 10 days of therapy.   Insulin-dependent type 2 diabetes, uncontrolled with hyperglycemia: Hemoglobin A1c more than 15.  Homeless, no access to medications, not using any medications. Started on insulin 70/30.  Charity was arranged and patient discharged home on insulin 75/25 that he will take 18 units twice daily.  He will need  outpatient follow-up.   Hypokalemia: Severe.  Replaced with improvement.  Magnesium was replaced with improvement.   Polysubstance abuse, heroin addiction: Last used a week ago.  No evidence of withdrawals.  Generalized pain issue. Tylenol, Celebrex and baclofen as scheduled.  Did not have any withdrawals.  Did not use any IV narcotics in the hospital.  Essential hypertension: Blood pressure stable.  Amlodipine was resumed.  This will be prescribed.  Stable for discharge.   Discharge Diagnoses:  Principal Problem:   Acute pyelonephritis Active Problems:   Hyperglycemia due to type 2 diabetes mellitus ([place])   Hyponatremia   Polysubstance abuse ([place])   Heroin addiction [facility])    Discharge Instructions  Discharge Instructions     Diet - low sodium heart healthy   Complete by: As directed    Discharge instructions   Complete by: As directed    Check your blood sugars and keep a record , bring it to doctors office for follow up.   Increase activity slowly   Complete by: As directed       Allergies as of 12/02/2021   No Known Allergies      Medication List     STOP taking these medications    DULoxetine 20 MG capsule Commonly known as: CYMBALTA   Ensure Max Protein Liqd   multivitamin with minerals Tabs tablet   nicotine 14 mg/24hr patch Commonly known as: NICODERM CQ - dosed in mg/24 hours   Rightest GS550 Blood Glucose test strip Generic drug: glucose blood       TAKE these medications    amLODipine 10 MG tablet Commonly known as: NORVASC Take 1 tablet (10 mg total) by mouth daily.   Baclofen 5  MG Tabs Take 5 mg by mouth 3 (three) times daily as needed for muscle spasms.   Blood Glucose Monitor System w/Device Kit USE UP TO 4 TIMES DAILY AS DIRECTED.   celecoxib 100 MG capsule Commonly known as: CELEBREX Take 1 capsule (100 mg total) by mouth 2 (two) times daily for 7 days.   Comfort EZ Pen Needles 32G X 4 MM Misc Generic drug: Insulin Pen  Needle USE AS DIRECTED TWICE DAILY (MORNING AND BEDTIME)   Insulin Lispro Prot & Lispro (75-25) 100 UNIT/ML Kwikpen Commonly known as: HumaLOG Mix 75/25 KwikPen Inject 18 Units into the skin 2 (two) times daily. What changed: how much to take   Rightest GL300 Lancets Misc USE AS DIRECTED UP TO 4 TIMES DAILY.   sulfamethoxazole-trimethoprim 800-160 MG tablet Commonly known as: BACTRIM DS Take 1 tablet by mouth every 12 (twelve) hours for 7 days.        No Known Allergies  Consultations: None   Procedures/Studies: ECHOCARDIOGRAM COMPLETE  Result Date: 11/29/2021    ECHOCARDIOGRAM REPORT   Patient Name:   Earl Moreno Date of Exam: 11/29/2021 Medical Rec #:  [id number]       Height:       60.0 in Accession #:    [id number]      Weight:       115.7 lb Date of Birth:  March 16, 1966        BSA:          1.479 m Patient Age:    71 years        BP:           154/106 mmHg Patient Gender: M               HR:           83 bpm. Exam Location:  [facility] Procedure: 2D Echo, Cardiac Doppler and Color Doppler Indications:     R78.81 Bacteremia  History:         Patient has no prior history of Echocardiogram examinations.                  Risk Factors:Current Smoker, Diabetes, Hypertension and                  Polysubstance abuse.  Sonographer:     Rosalia Hammers Referring Phys:  [id number] BRAXENMM AGBATA Diagnosing Phys: Nelva Bush MD  Sonographer Comments: Suboptimal apical window. Image acquisition challenging due to patient body habitus. IMPRESSIONS  1. Left ventricular ejection fraction, by estimation, is 60 to 65%. The left ventricle has normal function. The left ventricle has no regional wall motion abnormalities. Left ventricular diastolic parameters are indeterminate.  2. Right ventricular systolic function is normal. The right ventricular size is normal.  3. The mitral valve is normal in structure. Trivial mitral valve regurgitation. No evidence of mitral stenosis.  4. The aortic valve is tricuspid.  Aortic valve regurgitation is not visualized. No aortic stenosis is present.  5. The inferior vena cava is normal in size with greater than 50% respiratory variability, suggesting right atrial pressure of 3 mmHg. FINDINGS  Left Ventricle: Left ventricular ejection fraction, by estimation, is 60 to 65%. The left ventricle has normal function. The left ventricle has no regional wall motion abnormalities. The left ventricular internal cavity size was normal in size. There is  borderline left ventricular hypertrophy. Left ventricular diastolic parameters are indeterminate. Right Ventricle: The right ventricular size is normal. No increase in right ventricular wall  thickness. Right ventricular systolic function is normal. Left Atrium: Left atrial size was normal in size. Right Atrium: Right atrial size was normal in size. Pericardium: There is no evidence of pericardial effusion. Mitral Valve: The mitral valve is normal in structure. Trivial mitral valve regurgitation. No evidence of mitral valve stenosis. Tricuspid Valve: The tricuspid valve is normal in structure. Tricuspid valve regurgitation is trivial. Aortic Valve: The aortic valve is tricuspid. Aortic valve regurgitation is not visualized. No aortic stenosis is present. Aortic valve mean gradient measures 2.0 mmHg. Aortic valve peak gradient measures 3.7 mmHg. Aortic valve area, by VTI measures 2.45 cm. Pulmonic Valve: The pulmonic valve was normal in structure. Pulmonic valve regurgitation is not visualized. No evidence of pulmonic stenosis. Aorta: The aortic root is normal in size and structure. Pulmonary Artery: The pulmonary artery is of normal size. Venous: The inferior vena cava is normal in size with greater than 50% respiratory variability, suggesting right atrial pressure of 3 mmHg. IAS/Shunts: No atrial level shunt detected by color flow Doppler.  LEFT VENTRICLE PLAX 2D LVIDd:         3.69 cm   Diastology LVIDs:         2.64 cm   LV e' medial:    9.03  cm/s LV PW:         0.99 cm   LV E/e' medial:  6.7 LV IVS:        1.09 cm   LV e' lateral:   9.14 cm/s LVOT diam:     1.80 cm   LV E/e' lateral: 6.6 LV SV:         46 LV SV Index:   31 LVOT Area:     2.54 cm  RIGHT VENTRICLE RV Basal diam:  3.17 cm RV S prime:     16.00 cm/s TAPSE (M-mode): 3.1 cm LEFT ATRIUM           Index        RIGHT ATRIUM          Index LA diam:      2.70 cm 1.83 cm/m   RA Area:     8.48 cm LA Vol (A4C): 36.7 ml 24.81 ml/m  RA Volume:   17.30 ml 11.70 ml/m  AORTIC VALVE AV Area (Vmax):    1.89 cm AV Area (Vmean):   1.81 cm AV Area (VTI):     2.45 cm AV Vmax:           96.50 cm/s AV Vmean:          69.000 cm/s AV VTI:            0.186 m AV Peak Grad:      3.7 mmHg AV Mean Grad:      2.0 mmHg LVOT Vmax:         71.70 cm/s LVOT Vmean:        49.000 cm/s LVOT VTI:          0.179 m LVOT/AV VTI ratio: 0.96  AORTA Ao Root diam: 3.40 cm MITRAL VALVE MV Area (PHT): 2.34 cm    SHUNTS MV Decel Time: 324 msec    Systemic VTI:  0.18 m MV E velocity: 60.70 cm/s  Systemic Diam: 1.80 cm MV A velocity: 91.30 cm/s MV E/A ratio:  0.66 Harrell Gave End MD Electronically signed by Nelva Bush MD Signature Date/Time: 11/29/2021/3:50:04 PM    Final    DG Chest Portable 1 View  Result Date: 11/29/2021 CLINICAL DATA:  IVDU,  eval infiltrate.  Right flank pain. EXAM: PORTABLE CHEST 1 VIEW COMPARISON:  Abdominal CT earlier today.  Chest x-ray 02/09/2020 FINDINGS: Previously seen tree-in-bud densities on CT cannot be appreciated by plain film. No visible airspace opacities. No effusions. Heart is normal size. No acute bony abnormality. IMPRESSION: No active disease. Electronically Signed   By: Rolm Baptise M.D.   On: 11/29/2021 03:33   CT Renal Stone Study  Result Date: 11/29/2021 CLINICAL DATA:  Right-sided flank pain EXAM: CT ABDOMEN AND PELVIS WITHOUT CONTRAST TECHNIQUE: Multidetector CT imaging of the abdomen and pelvis was performed following the standard protocol without IV contrast. RADIATION  DOSE REDUCTION: This exam was performed according to the departmental dose-optimization program which includes automated exposure control, adjustment of the mA and/or kV according to patient size and/or use of iterative reconstruction technique. COMPARISON:  CT 10/28/2013 FINDINGS: Lower chest: Lung bases demonstrate mild bronchiectasis at the lingula. Mild tree-in-bud density at the lingula and left greater than right lung base. No acute consolidation or effusion. Hepatobiliary: Contracted gallbladder. No calcified stone or biliary dilatation Pancreas: Unremarkable. No pancreatic ductal dilatation or surrounding inflammatory changes. Spleen: Normal in size without focal abnormality. Adrenals/Urinary Tract: Adrenal glands are normal. No significant hydronephrosis. Examination is significantly limited by paucity of intra-abdominal fat. Right kidney appears enlarged compared to the left, suspicion mild perinephric edema. Prominent right renal pelvis, without definitive ureteral stone. The bladder is unremarkable Stomach/Bowel: The stomach is nonenlarged. There is no dilated small bowel. No acute bowel wall thickening Vascular/Lymphatic: Advanced aortic atherosclerosis. No aneurysm. No suspicious lymph nodes Reproductive: Prostate is unremarkable. Other: Negative for pelvic effusion or free air. Musculoskeletal: No acute or significant osseous findings. IMPRESSION: 1. Very limited study secondary to paucity of intra-abdominal fat. 2. Asymmetrically enlarged right kidney with suspicion of perinephric edema. Some renal pelvis dilatation without convincing obstructing stone, findings could be secondary to pyelonephritis. 3. Mild tree-in-bud density at the lingula and lung bases consistent with respiratory infection, potentially due to atypical organism Electronically Signed   By: Donavan Foil M.D.   On: 11/29/2021 02:47   (Echo, Carotid, EGD, Colonoscopy, ERCP)    Subjective: Patient seen and examined.  No overnight  events.  He wants to get out of the hospital.  Denies any pain.  Is asking me to prescribe some muscle relaxants and Celebrex. Medications were sent to hospital pharmacy.   Discharge Exam: Vitals:   12/02/21 0503 12/02/21 0817  BP: (!) 130/92 (!) 156/111  Pulse: 60 69  Resp: 17 16  Temp: 98.2 F (36.8 C) 99 F (37.2 C)  SpO2: 100% 100%   Vitals:   12/01/21 1704 12/01/21 2010 12/02/21 0503 12/02/21 0817  BP: (!) 140/101 (!) 132/94 (!) 130/92 (!) 156/111  Pulse: 74 70 60 69  Resp: _0 Temp: 98.3 F (36.8 C) 98.5 F (36.9 C) 98.2 F (36.8 C) 99 F (37.2 C)  TempSrc:  Oral    SpO2: 100% 100% 100% 100%  Weight:      Height:        General: Pt is alert, awake, not in acute distress Walking around.  On room air.  Looks comfortable. Cardiovascular: RRR, S1/S2 +, no rubs, no gallops Respiratory: CTA bilaterally, no wheezing, no rhonchi Abdominal: Soft, NT, ND, bowel sounds + Extremities: no edema, no cyanosis    The results of significant diagnostics from this hospitalization (including imaging, microbiology, ancillary and laboratory) are listed below for reference.     Microbiology: Recent Results (from  the past 240 hour(s))  Blood culture (routine x 2)     Status: Abnormal   Collection Time: 11/29/21  2:19 AM   Specimen: BLOOD  Result Value Ref Range Status   Specimen Description   Final    BLOOD RIGHT ANTECUBITAL Performed at [facility], [place] [address], [place], [place] [address]    Special Requests   Final    BOTTLES DRAWN AEROBIC AND ANAEROBIC Blood Culture adequate volume Performed at [facility], [address] [place], [place] [address]    Culture  Setup Time   Final    Organism ID to follow GRAM NEGATIVE RODS IN BOTH AEROBIC AND ANAEROBIC BOTTLES CRITICAL RESULT CALLED TO, READ BACK BY AND VERIFIED WITH: KISHAN PATEL _0  11/29/21 MJU Performed at [facility], [address] [place], [place] [address]    Culture  ENTEROBACTER AEROGENES (A)  Final   Report Status 12/01/2021 FINAL  Final   Organism ID, Bacteria ENTEROBACTER AEROGENES  Final      Susceptibility   Enterobacter aerogenes - MIC*    CEFAZOLIN >=64 RESISTANT Resistant     CEFEPIME <=0.12 SENSITIVE Sensitive     CEFTAZIDIME <=1 SENSITIVE Sensitive     CEFTRIAXONE <=0.25 SENSITIVE Sensitive     CIPROFLOXACIN <=0.25 SENSITIVE Sensitive     GENTAMICIN <=1 SENSITIVE Sensitive     IMIPENEM 1 SENSITIVE Sensitive     TRIMETH/SULFA <=20 SENSITIVE Sensitive     PIP/TAZO <=4 SENSITIVE Sensitive     * ENTEROBACTER AEROGENES  Blood culture (routine x 2)     Status: Abnormal (Preliminary result)   Collection Time: 11/29/21  2:19 AM   Specimen: BLOOD  Result Value Ref Range Status   Specimen Description   Final    BLOOD BLOOD RIGHT FOREARM Performed at [facility], [place] [address], [place], [place] [address]    Special Requests   Final    BOTTLES DRAWN AEROBIC AND ANAEROBIC Blood Culture results may not be optimal due to an excessive volume of blood received in culture bottles Performed at [facility], [address] [place], [place] [address]    Culture  Setup Time   Final    IN BOTH AEROBIC AND ANAEROBIC BOTTLES GRAM NEGATIVE RODS CRITICAL RESULT CALLED TO, READ BACK BY AND VERIFIED WITH: KISHAN PATEL _1  11/29/21 MJU    Culture (A)  Final    ENTEROBACTER AEROGENES SUSCEPTIBILITIES PERFORMED ON PREVIOUS CULTURE WITHIN THE LAST 5 DAYS. Performed at [facility], [place] [address] [place], [place] [address]    Report Status PENDING  Incomplete  Blood Culture ID Panel (Reflexed)     Status: Abnormal   Collection Time: 11/29/21  2:19 AM  Result Value Ref Range Status   Enterococcus faecalis NOT DETECTED NOT DETECTED Final   Enterococcus Faecium NOT DETECTED NOT DETECTED Final   Listeria monocytogenes NOT DETECTED NOT DETECTED Final   Staphylococcus species NOT DETECTED NOT DETECTED Final   Staphylococcus aureus  (BCID) NOT DETECTED NOT DETECTED Final   Staphylococcus epidermidis NOT DETECTED NOT DETECTED Final   Staphylococcus lugdunensis NOT DETECTED NOT DETECTED Final   Streptococcus species NOT DETECTED NOT DETECTED Final   Streptococcus agalactiae NOT DETECTED NOT DETECTED Final   Streptococcus pneumoniae NOT DETECTED NOT DETECTED Final   Streptococcus pyogenes NOT DETECTED NOT DETECTED Final   A.calcoaceticus-baumannii NOT DETECTED NOT DETECTED Final   Bacteroides fragilis NOT DETECTED NOT DETECTED Final   Enterobacterales DETECTED (A) NOT DETECTED Final  Comment: Enterobacterales represent a large order of gram negative bacteria, not a single organism. CRITICAL RESULT CALLED TO, READ BACK BY AND VERIFIED WITH: KISHAN PATEL _0  11/29/21 MJU    Enterobacter cloacae complex NOT DETECTED NOT DETECTED Final   Escherichia coli NOT DETECTED NOT DETECTED Final   Klebsiella aerogenes DETECTED (A) NOT DETECTED Final    Comment: CRITICAL RESULT CALLED TO, READ BACK BY AND VERIFIED WITH: KISHAN PATEL _1  11/29/21 MJU    Klebsiella oxytoca NOT DETECTED NOT DETECTED Final   Klebsiella pneumoniae NOT DETECTED NOT DETECTED Final   Proteus species NOT DETECTED NOT DETECTED Final   Salmonella species NOT DETECTED NOT DETECTED Final   Serratia marcescens NOT DETECTED NOT DETECTED Final   Haemophilus influenzae NOT DETECTED NOT DETECTED Final   Neisseria meningitidis NOT DETECTED NOT DETECTED Final   Pseudomonas aeruginosa NOT DETECTED NOT DETECTED Final   Stenotrophomonas maltophilia NOT DETECTED NOT DETECTED Final   Candida albicans NOT DETECTED NOT DETECTED Final   Candida auris NOT DETECTED NOT DETECTED Final   Candida glabrata NOT DETECTED NOT DETECTED Final   Candida krusei NOT DETECTED NOT DETECTED Final   Candida parapsilosis NOT DETECTED NOT DETECTED Final   Candida tropicalis NOT DETECTED NOT DETECTED Final   Cryptococcus neoformans/gattii NOT DETECTED NOT DETECTED Final   CTX-M ESBL NOT  DETECTED NOT DETECTED Final   Carbapenem resistance IMP NOT DETECTED NOT DETECTED Final   Carbapenem resistance KPC NOT DETECTED NOT DETECTED Final   Carbapenem resistance NDM NOT DETECTED NOT DETECTED Final   Carbapenem resist OXA 48 LIKE NOT DETECTED NOT DETECTED Final   Carbapenem resistance VIM NOT DETECTED NOT DETECTED Final    Comment: Performed at [facility], [address], [place], [place] [address]  Surgical pcr screen     Status: None   Collection Time: 11/29/21  3:59 PM   Specimen: Nasal Mucosa; Nasal Swab  Result Value Ref Range Status   MRSA, PCR NEGATIVE NEGATIVE Final   Staphylococcus aureus NEGATIVE NEGATIVE Final    Comment: (NOTE) The Xpert SA Assay (FDA approved for NASAL specimens in patients 4 years of age and older), is one component of a comprehensive surveillance program. It is not intended to diagnose infection nor to guide or monitor treatment. Performed at [facility], [address] [place], [place] [address]      Labs: BNP (last 3 results) No results for input(s): "BNP" in the last 8760 hours. Basic Metabolic Panel: Recent Labs  Lab 11/28/21 2032 11/30/21 0828 12/01/21 2158  NA 123* 131* 130*  K 3.7 2.7* 4.3  CL 83* 98 100  CO2 _2 GLUCOSE 426* 225* 238*  BUN _3 CREATININE 0.67 0.50* 0.59*  CALCIUM 8.7* 8.1* 8.2*  MG  --   --  1.9  PHOS  --   --  2.3*   Liver Function Tests: Recent Labs  Lab 11/28/21 2032  AST 16  ALT 18  ALKPHOS 183*  BILITOT 0.9  PROT 8.4*  ALBUMIN 2.9*   No results for input(s): "LIPASE", "AMYLASE" in the last 168 hours. No results for input(s): "AMMONIA" in the last 168 hours. CBC: Recent Labs  Lab 11/28/21 2032 11/30/21 0828 12/01/21 2158  WBC 17.2* 15.3* 13.2*  NEUTROABS 14.8*  --  9.8*  HGB 13.9 13.1 12.6*  HCT 39.8 37.6* 37.1*  MCV 86.7 86.0 87.1  PLT 237 230 261   Cardiac Enzymes: No results for input(s): "CKTOTAL", "CKMB", "CKMBINDEX", "TROPONINI" in the  last 168 hours. BNP: Invalid input(s): "POCBNP" CBG: Recent Labs  Lab 12/01/21 0825 12/01/21 1223 12/01/21 2010 12/02/21 0819 12/02/21 0834  GLUCAP 299* 380* 193* 430* 381*   D-Dimer No results for input(s): "DDIMER" in the last 72 hours. Hgb A1c Recent Labs    11/29/21 1402  HGBA1C 13.2*   Lipid Profile No results for input(s): "CHOL", "HDL", "LDLCALC", "TRIG", "CHOLHDL", "LDLDIRECT" in the last 72 hours. Thyroid function studies No results for input(s): "TSH", "T4TOTAL", "T3FREE", "THYROIDAB" in the last 72 hours.  Invalid input(s): "FREET3" Anemia work up No results for input(s): "VITAMINB12", "FOLATE", "FERRITIN", "TIBC", "IRON", "RETICCTPCT" in the last 72 hours. Urinalysis    Component Value Date/Time   COLORURINE YELLOW (A) 11/29/2021 0219   APPEARANCEUR HAZY (A) 11/29/2021 0219   APPEARANCEUR Cloudy 12/30/2012 1018   LABSPEC 1.024 11/29/2021 0219   LABSPEC 1.017 12/30/2012 1018   PHURINE 6.0 11/29/2021 0219   GLUCOSEU >=500 (A) 11/29/2021 0219   GLUCOSEU Negative 12/30/2012 1018   HGBUR MODERATE (A) 11/29/2021 0219   BILIRUBINUR NEGATIVE 11/29/2021 0219   BILIRUBINUR Negative 12/30/2012 1018   KETONESUR 5 (A) 11/29/2021 0219   PROTEINUR 30 (A) 11/29/2021 0219   NITRITE NEGATIVE 11/29/2021 0219   LEUKOCYTESUR TRACE (A) 11/29/2021 0219   LEUKOCYTESUR Negative 12/30/2012 1018   Sepsis Labs Recent Labs  Lab 11/28/21 2032 11/30/21 0828 12/01/21 2158  WBC 17.2* 15.3* 13.2*   Microbiology Recent Results (from the past 240 hour(s))  Blood culture (routine x 2)     Status: Abnormal   Collection Time: 11/29/21  2:19 AM   Specimen: BLOOD  Result Value Ref Range Status   Specimen Description   Final    BLOOD RIGHT ANTECUBITAL Performed at [facility], [place] [address], [place], [place] [address]    Special Requests   Final    BOTTLES DRAWN AEROBIC AND ANAEROBIC Blood Culture adequate volume Performed at [facility], [address] [place], [place] [address]    Culture  Setup Time   Final    Organism ID to follow GRAM NEGATIVE RODS IN BOTH AEROBIC AND ANAEROBIC BOTTLES CRITICAL RESULT CALLED TO, READ BACK BY AND VERIFIED WITH: KISHAN PATEL _0  11/29/21 MJU Performed at [facility], [address] [place], [place] [address]    Culture ENTEROBACTER AEROGENES (A)  Final   Report Status 12/01/2021 FINAL  Final   Organism ID, Bacteria ENTEROBACTER AEROGENES  Final      Susceptibility   Enterobacter aerogenes - MIC*    CEFAZOLIN >=64 RESISTANT Resistant     CEFEPIME <=0.12 SENSITIVE Sensitive     CEFTAZIDIME <=1 SENSITIVE Sensitive     CEFTRIAXONE <=0.25 SENSITIVE Sensitive     CIPROFLOXACIN <=0.25 SENSITIVE Sensitive     GENTAMICIN <=1 SENSITIVE Sensitive     IMIPENEM 1 SENSITIVE Sensitive     TRIMETH/SULFA <=20 SENSITIVE Sensitive     PIP/TAZO <=4 SENSITIVE Sensitive     * ENTEROBACTER AEROGENES  Blood culture (routine x 2)     Status: Abnormal (Preliminary result)   Collection Time: 11/29/21  2:19 AM   Specimen: BLOOD  Result Value Ref Range Status   Specimen Description   Final    BLOOD BLOOD RIGHT FOREARM Performed at [facility], [place] [address], [place], [place] [address]    Special Requests   Final    BOTTLES DRAWN AEROBIC AND ANAEROBIC Blood Culture results may not be optimal due to an excessive volume of blood received in culture  bottles Performed at [facility], [address] [place], [place] [address]    Culture  Setup Time   Final    IN BOTH AEROBIC AND ANAEROBIC BOTTLES GRAM NEGATIVE RODS CRITICAL RESULT CALLED TO, READ BACK BY AND VERIFIED WITH: KISHAN PATEL _0  11/29/21 MJU    Culture (A)  Final    ENTEROBACTER AEROGENES SUSCEPTIBILITIES PERFORMED ON PREVIOUS CULTURE WITHIN THE LAST 5 DAYS. Performed at [facility], [place] [address] [place], [place] [address]    Report Status PENDING  Incomplete  Blood Culture ID Panel (Reflexed)     Status: Abnormal    Collection Time: 11/29/21  2:19 AM  Result Value Ref Range Status   Enterococcus faecalis NOT DETECTED NOT DETECTED Final   Enterococcus Faecium NOT DETECTED NOT DETECTED Final   Listeria monocytogenes NOT DETECTED NOT DETECTED Final   Staphylococcus species NOT DETECTED NOT DETECTED Final   Staphylococcus aureus (BCID) NOT DETECTED NOT DETECTED Final   Staphylococcus epidermidis NOT DETECTED NOT DETECTED Final   Staphylococcus lugdunensis NOT DETECTED NOT DETECTED Final   Streptococcus species NOT DETECTED NOT DETECTED Final   Streptococcus agalactiae NOT DETECTED NOT DETECTED Final   Streptococcus pneumoniae NOT DETECTED NOT DETECTED Final   Streptococcus pyogenes NOT DETECTED NOT DETECTED Final   A.calcoaceticus-baumannii NOT DETECTED NOT DETECTED Final   Bacteroides fragilis NOT DETECTED NOT DETECTED Final   Enterobacterales DETECTED (A) NOT DETECTED Final    Comment: Enterobacterales represent a large order of gram negative bacteria, not a single organism. CRITICAL RESULT CALLED TO, READ BACK BY AND VERIFIED WITH: KISHAN PATEL _1  11/29/21 MJU    Enterobacter cloacae complex NOT DETECTED NOT DETECTED Final   Escherichia coli NOT DETECTED NOT DETECTED Final   Klebsiella aerogenes DETECTED (A) NOT DETECTED Final    Comment: CRITICAL RESULT CALLED TO, READ BACK BY AND VERIFIED WITH: KISHAN PATEL _2  11/29/21 MJU    Klebsiella oxytoca NOT DETECTED NOT DETECTED Final   Klebsiella pneumoniae NOT DETECTED NOT DETECTED Final   Proteus species NOT DETECTED NOT DETECTED Final   Salmonella species NOT DETECTED NOT DETECTED Final   Serratia marcescens NOT DETECTED NOT DETECTED Final   Haemophilus influenzae NOT DETECTED NOT DETECTED Final   Neisseria meningitidis NOT DETECTED NOT DETECTED Final   Pseudomonas aeruginosa NOT DETECTED NOT DETECTED Final   Stenotrophomonas maltophilia NOT DETECTED NOT DETECTED Final   Candida albicans NOT DETECTED NOT DETECTED Final   Candida auris NOT  DETECTED NOT DETECTED Final   Candida glabrata NOT DETECTED NOT DETECTED Final   Candida krusei NOT DETECTED NOT DETECTED Final   Candida parapsilosis NOT DETECTED NOT DETECTED Final   Candida tropicalis NOT DETECTED NOT DETECTED Final   Cryptococcus neoformans/gattii NOT DETECTED NOT DETECTED Final   CTX-M ESBL NOT DETECTED NOT DETECTED Final   Carbapenem resistance IMP NOT DETECTED NOT DETECTED Final   Carbapenem resistance KPC NOT DETECTED NOT DETECTED Final   Carbapenem resistance NDM NOT DETECTED NOT DETECTED Final   Carbapenem resist OXA 48 LIKE NOT DETECTED NOT DETECTED Final   Carbapenem resistance VIM NOT DETECTED NOT DETECTED Final    Comment: Performed at [facility], [address] [place], [place] [address]  Surgical pcr screen     Status: None   Collection Time: 11/29/21  3:59 PM   Specimen: Nasal Mucosa; Nasal Swab  Result Value Ref Range Status   MRSA, PCR NEGATIVE NEGATIVE Final   Staphylococcus aureus NEGATIVE NEGATIVE Final    Comment: (NOTE) The Xpert  SA Assay (FDA approved for NASAL specimens in patients 37 years of age and older), is one component of a comprehensive surveillance program. It is not intended to diagnose infection nor to guide or monitor treatment. Performed at [facility], [address] [place], [place] [address]      Time coordinating discharge: 35 minutes  SIGNED:   Barb Merino, MD  [facility] 12/02/2021, 9:40 AM

## 2021-12-02 NOTE — Inpatient Diabetes Management (Signed)
Inpatient Diabetes Program Recommendations  AACE/ADA: New Consensus Statement on Inpatient Glycemic Control   Target Ranges:  Prepandial:   less than 140 mg/dL      Peak postprandial:   less than 180 mg/dL (1-2 hours)      Critically ill patients:  140 - 180 mg/dL    Latest Reference Range & Units 12/01/21 08:25 12/01/21 12:23 12/01/21 17:10 12/01/21 20:10  Glucose-Capillary 70 - 99 mg/dL 725 (H) 366 (H) 440 (H) 193 (H)   Review of Glycemic Control  Diabetes history: DM2 hx (dx in September 2021) Outpatient Diabetes medications: None; was prescribed Humalog 75/25 30 units BID Current orders for Inpatient glycemic control: Levemir 20 units daily, Novolog 0-15 units TID with meals, Novolog 0-5 units QHS, Novolog 4 units TID with meals   Inpatient Diabetes Program Recommendations:     Insulin: Noted patient will likely be discharged on 70/30 insulin. Therefore, may want to go ahead and switch to 70/30 at this time, please discontinue Levemir and Novolog 4 units TID meal coverage and order 70/30 18 units BID (dose will provide a total of 25.2 units for basal and 10.8 units for meal coverage per day).  Outpatient: If patient is able to get medications from [facility] at discharge, they have Humalog 75/25 Kwikpens [phone]). Would also need Rx for insulin pen needles (#[id number])  Thanks, [name], RN, MSN, CDCES Diabetes Coordinator Inpatient Diabetes Program [phone] (Team Pager from 8am to 5pm)

## 2021-12-02 NOTE — Progress Notes (Signed)
Pt being discharged, discharge reviewed with pt, states understanding, pt with no complaints

## 2021-12-02 NOTE — Progress Notes (Signed)
Mobility Specialist - Progress Note    12/02/21 0900  Mobility  Activity Ambulated independently in hallway  Level of Assistance Independent  Assistive Device None  Distance Ambulated (ft) 300 ft  Activity Response Tolerated well  $Mobility charge 1 Mobility   Clarisa Schools Mobility Specialist 12/02/21, 9:38 AM

## 2021-12-03 LAB — CULTURE, BLOOD (ROUTINE X 2)

## 2021-12-05 ENCOUNTER — Other Ambulatory Visit: Payer: Self-pay

## 2021-12-06 ENCOUNTER — Other Ambulatory Visit: Payer: Self-pay

## 2021-12-16 ENCOUNTER — Other Ambulatory Visit: Payer: Self-pay

## 2021-12-16 ENCOUNTER — Other Ambulatory Visit: Payer: Self-pay | Admitting: Pharmacy Technician

## 2021-12-16 NOTE — Patient Outreach (Signed)
Patient only signed DOH Attestation.  Would need to provide current year's household income if PAP medications were needed.  Gerrica Cygan J. Arlynn Mcdermid Patient Advocate Specialist [facility]  

## 2022-01-14 ENCOUNTER — Encounter: Payer: Self-pay | Admitting: Emergency Medicine

## 2022-01-14 ENCOUNTER — Observation Stay
Admission: EM | Admit: 2022-01-14 | Discharge: 2022-01-15 | DRG: 690 | Discharge status: Against Medical Advice | Payer: Medicaid Other | Attending: Osteopathic Medicine | Admitting: Osteopathic Medicine

## 2022-01-14 DIAGNOSIS — Z79899 Other long term (current) drug therapy: Secondary | ICD-10-CM | POA: Diagnosis not present

## 2022-01-14 DIAGNOSIS — F17210 Nicotine dependence, cigarettes, uncomplicated: Secondary | ICD-10-CM | POA: Diagnosis not present

## 2022-01-14 DIAGNOSIS — F1120 Opioid dependence, uncomplicated: Secondary | ICD-10-CM | POA: Diagnosis not present

## 2022-01-14 DIAGNOSIS — Z833 Family history of diabetes mellitus: Secondary | ICD-10-CM | POA: Diagnosis not present

## 2022-01-14 DIAGNOSIS — Z8619 Personal history of other infectious and parasitic diseases: Principal | ICD-10-CM

## 2022-01-14 DIAGNOSIS — E1165 Type 2 diabetes mellitus with hyperglycemia: Secondary | ICD-10-CM | POA: Diagnosis not present

## 2022-01-14 DIAGNOSIS — Z66 Do not resuscitate: Secondary | ICD-10-CM | POA: Diagnosis not present

## 2022-01-14 DIAGNOSIS — F1910 Other psychoactive substance abuse, uncomplicated: Secondary | ICD-10-CM | POA: Diagnosis not present

## 2022-01-14 DIAGNOSIS — R64 Cachexia: Secondary | ICD-10-CM | POA: Diagnosis not present

## 2022-01-14 DIAGNOSIS — F1920 Other psychoactive substance dependence, uncomplicated: Secondary | ICD-10-CM | POA: Diagnosis present

## 2022-01-14 DIAGNOSIS — E871 Hypo-osmolality and hyponatremia: Secondary | ICD-10-CM | POA: Diagnosis not present

## 2022-01-14 DIAGNOSIS — E440 Moderate protein-calorie malnutrition: Secondary | ICD-10-CM | POA: Diagnosis not present

## 2022-01-14 DIAGNOSIS — N12 Tubulo-interstitial nephritis, not specified as acute or chronic: Principal | ICD-10-CM | POA: Diagnosis present

## 2022-01-14 DIAGNOSIS — I10 Essential (primary) hypertension: Secondary | ICD-10-CM | POA: Diagnosis not present

## 2022-01-14 DIAGNOSIS — E876 Hypokalemia: Secondary | ICD-10-CM | POA: Diagnosis present

## 2022-01-14 DIAGNOSIS — R1900 Intra-abdominal and pelvic swelling, mass and lump, unspecified site: Secondary | ICD-10-CM | POA: Diagnosis not present

## 2022-01-14 DIAGNOSIS — M25551 Pain in right hip: Secondary | ICD-10-CM

## 2022-01-14 DIAGNOSIS — Z794 Long term (current) use of insulin: Secondary | ICD-10-CM | POA: Diagnosis not present

## 2022-01-14 DIAGNOSIS — E878 Other disorders of electrolyte and fluid balance, not elsewhere classified: Secondary | ICD-10-CM | POA: Diagnosis present

## 2022-01-14 DIAGNOSIS — F69 Unspecified disorder of adult personality and behavior: Secondary | ICD-10-CM

## 2022-01-14 DIAGNOSIS — M89551 Osteolysis, right thigh: Secondary | ICD-10-CM | POA: Diagnosis not present

## 2022-01-14 DIAGNOSIS — Z681 Body mass index (BMI) 19 or less, adult: Secondary | ICD-10-CM | POA: Diagnosis not present

## 2022-01-14 DIAGNOSIS — Z87440 Personal history of urinary (tract) infections: Secondary | ICD-10-CM

## 2022-01-14 MED ORDER — ONDANSETRON HCL 4 MG/2ML IJ SOLN
4.0000 mg | Freq: Once | INTRAMUSCULAR | Status: AC
  Administered 2022-01-15: 4 mg via INTRAVENOUS
  Filled 2022-01-14: qty 2

## 2022-01-14 MED ORDER — MORPHINE SULFATE (PF) 4 MG/ML IV SOLN
4.0000 mg | Freq: Once | INTRAVENOUS | Status: AC
  Administered 2022-01-15: 4 mg via INTRAVENOUS
  Filled 2022-01-14: qty 1

## 2022-01-14 MED ORDER — OXYCODONE-ACETAMINOPHEN 5-325 MG PO TABS
1.0000 | ORAL_TABLET | Freq: Four times a day (QID) | ORAL | Status: DC | PRN
  Administered 2022-01-15 (×2): 1 via ORAL
  Filled 2022-01-14 (×2): qty 1

## 2022-01-14 MED ORDER — LORAZEPAM 2 MG/ML IJ SOLN
0.5000 mg | Freq: Once | INTRAMUSCULAR | Status: AC
  Administered 2022-01-15: 0.5 mg via INTRAVENOUS
  Filled 2022-01-14: qty 1

## 2022-01-14 NOTE — ED Provider Notes (Signed)
[facility] Provider Note    Event Date/Time   First MD Initiated Contact with Patient 01/14/22 2242     (approximate)   History   Back Pain, Dysuria, and Urinary Frequency   HPI  Earl Moreno is a 56 y.o. male who complains of right-sided low back pain dysuria and frequency.  He has been admitted once here and twice in [facility] for urinary tract infection pyelonephritis and/or sepsis.  He signed out AMA each time.  Review of his old records show that he did verbalize clear understanding of the risks of leaving the hospital.  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      Physical Exam   Triage Vital Signs: ED Triage Vitals  Enc Vitals Group     BP 01/14/22 2219 (!) 143/91     Pulse Rate 01/14/22 2219 84     Resp 01/14/22 2219 16     Temp 01/14/22 2219 98.3 F (36.8 C)     Temp Source 01/14/22 2219 Oral     SpO2 01/14/22 2219 98 %     Weight 01/14/22 2220 115 lb (52.2 kg)     Height 01/14/22 2220 5' 8.11" (1.73 m)     Head Circumference --      Peak Flow --      Pain Score 01/14/22 2220 9     Pain Loc --      Pain Edu? --      Excl. in GC? --     Most recent vital signs: Vitals:   01/14/22 2219  BP: (!) 143/91  Pulse: 84  Resp: 16  Temp: 98.3 F (36.8 C)  SpO2: 98%     General: Awake, no distress.  Almost cachectic looking CV:  Good peripheral perfusion.  Heart regular rate and rhythm no audible murmurs Resp:  Normal effort.  Abd:  No distention.  Soft and nontender Extremities with no edema patient does have pain low down on the right knee over the pelvic bone   ED Results / Procedures / Treatments   Labs (all labs ordered are listed, but only abnormal results are displayed) Labs Reviewed  URINE CULTURE  CULTURE, BLOOD (ROUTINE X 2)  CULTURE, BLOOD (ROUTINE X 2)  COMPREHENSIVE METABOLIC PANEL  URINALYSIS, ROUTINE W REFLEX  MICROSCOPIC  LACTIC ACID, PLASMA  LACTIC ACID, PLASMA  CBC WITH DIFFERENTIAL/PLATELET  SEDIMENTATION RATE     EKG     RADIOLOGY    PROCEDURES:  Critical Care performed:  Procedures   MEDICATIONS ORDERED IN ED: Medications  morphine (PF) 4 MG/ML injection 4 mg (has no administration in time range)  ondansetron (ZOFRAN) injection 4 mg (has no administration in time range)  LORazepam (ATIVAN) injection 0.5 mg (has no administration in time range)  oxyCODONE-acetaminophen (PERCOCET/ROXICET) 5-325 MG per tablet 1 tablet (has no administration in time range)     IMPRESSION / MDM / ASSESSMENT AND PLAN / ED COURSE  I reviewed the triage vital signs and the nursing notes. [place] did a CT which found a osteolytic lesion in the right pelvis.  They were planning on doing an MRI but the patient left AMA.  The plan for the MRI was to see if it was cancerous as they suspected.  I have ordered the MRI so we can get that going down before he decides to leave again.  Differential diagnosis includes, but is not limited  to, UTI, urosepsis incompletely treated, other sepsis.  Patient has a history of gram-negative sepsis without ever completing treatment.  He is feeling weak and tired.  Additionally he may have a cancer in the right pelvis.  Patient's presentation is most consistent with acute presentation with potential threat to life or bodily function.  The patient is on the cardiac monitor to evaluate for evidence of arrhythmia and/or significant heart rate changes.  None have been seen   I am going to sign the patient out to the oncoming physician.  We do not have anything back at this point.  It will take several hours to get these things back.  Patient understands this I believe.  I have ordered p.o. pain meds for now in case he does have osteomyelitis and/or metastatic cancer in his hip.  FINAL CLINICAL IMPRESSION(S) / ED DIAGNOSES   Final diagnoses:  History of gram negative  sepsis  History of UTI  Pain of right hip     Rx / DC Orders   ED Discharge Orders     None        Note:  This document was prepared using Dragon voice recognition software and may include unintentional dictation errors.   Arnaldo Natal, MD 01/14/22 [phone]

## 2022-01-14 NOTE — ED Notes (Signed)
Pt is A&Ox4. Pt is very thin and endorsing back pain. Pt left AMA prior to completely treatment 2 days ago from [place]. Pt has expressed that he knows he needs to stay in the hospital to receive treatment. Pt is an extremely difficult stick, so IV consult has been placed to get labs and lines via [place].

## 2022-01-14 NOTE — ED Triage Notes (Signed)
Pt presents to ER with complaints of lower back pain, urinary frequency, pt reports he was at another facility admitted and left AMA about 2 days ago, pt reports he was admitted due to infection getting in his blood stream. Pt talks in complete sentences no respiratory distress noted

## 2022-01-15 ENCOUNTER — Emergency Department: Payer: Medicaid Other

## 2022-01-15 DIAGNOSIS — Z794 Long term (current) use of insulin: Secondary | ICD-10-CM

## 2022-01-15 DIAGNOSIS — E1165 Type 2 diabetes mellitus with hyperglycemia: Secondary | ICD-10-CM | POA: Diagnosis not present

## 2022-01-15 DIAGNOSIS — N12 Tubulo-interstitial nephritis, not specified as acute or chronic: Principal | ICD-10-CM

## 2022-01-15 DIAGNOSIS — F69 Unspecified disorder of adult personality and behavior: Secondary | ICD-10-CM

## 2022-01-15 DIAGNOSIS — E876 Hypokalemia: Secondary | ICD-10-CM | POA: Diagnosis not present

## 2022-01-15 DIAGNOSIS — E871 Hypo-osmolality and hyponatremia: Secondary | ICD-10-CM

## 2022-01-15 DIAGNOSIS — R1900 Intra-abdominal and pelvic swelling, mass and lump, unspecified site: Secondary | ICD-10-CM

## 2022-01-15 DIAGNOSIS — I10 Essential (primary) hypertension: Secondary | ICD-10-CM

## 2022-01-15 LAB — COMPREHENSIVE METABOLIC PANEL
ALT: 17 U/L (ref 0–44)
AST: 24 U/L (ref 15–41)
Albumin: 2.4 g/dL — ABNORMAL LOW (ref 3.5–5.0)
Alkaline Phosphatase: 110 U/L (ref 38–126)
Anion gap: 10 (ref 5–15)
BUN: 12 mg/dL (ref 6–20)
CO2: 24 mmol/L (ref 22–32)
Calcium: 8.1 mg/dL — ABNORMAL LOW (ref 8.9–10.3)
Chloride: 96 mmol/L — ABNORMAL LOW (ref 98–111)
Creatinine, Ser: 0.73 mg/dL (ref 0.61–1.24)
GFR, Estimated: >60 mL/min (ref >60)
Glucose, Bld: 250 mg/dL — ABNORMAL HIGH (ref 70–99)
Potassium: 3.2 mmol/L — ABNORMAL LOW (ref 3.5–5.1)
Sodium: 130 mmol/L — ABNORMAL LOW (ref 135–145)
Total Bilirubin: 0.5 mg/dL (ref 0.3–1.2)
Total Protein: 7.6 g/dL (ref 6.5–8.1)

## 2022-01-15 LAB — CBC
HCT: 30.5 % — ABNORMAL LOW (ref 39.0–52.0)
Hemoglobin: 10.5 g/dL — ABNORMAL LOW (ref 13.0–17.0)
MCH: 29.2 pg (ref 26.0–34.0)
MCHC: 34.4 g/dL (ref 30.0–36.0)
MCV: 85 fL (ref 80.0–100.0)
Platelets: 235 10*3/uL (ref 150–400)
RBC: 3.59 MIL/uL — ABNORMAL LOW (ref 4.22–5.81)
RDW: 13.2 % (ref 11.5–15.5)
WBC: 15.6 10*3/uL — ABNORMAL HIGH (ref 4.0–10.5)
nRBC: 0 % (ref 0.0–0.2)

## 2022-01-15 LAB — URINALYSIS, ROUTINE W REFLEX MICROSCOPIC
Bilirubin Urine: NEGATIVE
Glucose, UA: NEGATIVE mg/dL
Ketones, ur: NEGATIVE mg/dL
Nitrite: NEGATIVE
Protein, ur: NEGATIVE mg/dL
Specific Gravity, Urine: 1.003 — ABNORMAL LOW (ref 1.005–1.030)
Squamous Epithelial / HPF: NONE SEEN (ref 0–5)
pH: 7 (ref 5.0–8.0)

## 2022-01-15 LAB — GLUCOSE, CAPILLARY
Glucose-Capillary: 223 mg/dL — ABNORMAL HIGH (ref 70–99)
Glucose-Capillary: 366 mg/dL — ABNORMAL HIGH (ref 70–99)

## 2022-01-15 LAB — BASIC METABOLIC PANEL
Anion gap: 7 (ref 5–15)
BUN: 12 mg/dL (ref 6–20)
CO2: 24 mmol/L (ref 22–32)
Calcium: 7.8 mg/dL — ABNORMAL LOW (ref 8.9–10.3)
Chloride: 101 mmol/L (ref 98–111)
Creatinine, Ser: 0.78 mg/dL (ref 0.61–1.24)
GFR, Estimated: >60 mL/min (ref >60)
Glucose, Bld: 235 mg/dL — ABNORMAL HIGH (ref 70–99)
Potassium: 3.1 mmol/L — ABNORMAL LOW (ref 3.5–5.1)
Sodium: 132 mmol/L — ABNORMAL LOW (ref 135–145)

## 2022-01-15 LAB — CBC WITH DIFFERENTIAL/PLATELET
Abs Immature Granulocytes: 0.17 10*3/uL — ABNORMAL HIGH (ref 0.00–0.07)
Basophils Absolute: 0.1 10*3/uL (ref 0.0–0.1)
Basophils Relative: 0 %
Eosinophils Absolute: 0.1 10*3/uL (ref 0.0–0.5)
Eosinophils Relative: 1 %
HCT: 35.4 % — ABNORMAL LOW (ref 39.0–52.0)
Hemoglobin: 11.9 g/dL — ABNORMAL LOW (ref 13.0–17.0)
Immature Granulocytes: 1 %
Lymphocytes Relative: 12 %
Lymphs Abs: 1.9 10*3/uL (ref 0.7–4.0)
MCH: 28.4 pg (ref 26.0–34.0)
MCHC: 33.6 g/dL (ref 30.0–36.0)
MCV: 84.5 fL (ref 80.0–100.0)
Monocytes Absolute: 0.6 10*3/uL (ref 0.1–1.0)
Monocytes Relative: 4 %
Neutro Abs: 12.6 10*3/uL — ABNORMAL HIGH (ref 1.7–7.7)
Neutrophils Relative %: 82 %
Platelets: 224 10*3/uL (ref 150–400)
RBC: 4.19 MIL/uL — ABNORMAL LOW (ref 4.22–5.81)
RDW: 13.4 % (ref 11.5–15.5)
WBC: 15.4 10*3/uL — ABNORMAL HIGH (ref 4.0–10.5)
nRBC: 0 % (ref 0.0–0.2)

## 2022-01-15 LAB — SEDIMENTATION RATE: Sed Rate: 69 mm/hr — ABNORMAL HIGH (ref 0–16)

## 2022-01-15 LAB — LACTIC ACID, PLASMA
Lactic Acid, Venous: 1.7 mmol/L (ref 0.5–1.9)
Lactic Acid, Venous: 1.7 mmol/L (ref 0.5–1.9)
Lactic Acid, Venous: 2.2 mmol/L (ref 0.5–1.9)

## 2022-01-15 MED ORDER — ONDANSETRON HCL 4 MG/2ML IJ SOLN: 4.0000 mg | Freq: Four times a day (QID) | INTRAMUSCULAR | Status: DC | PRN

## 2022-01-15 MED ORDER — VANCOMYCIN HCL 1250 MG/250ML IV SOLN
1250.0000 mg | INTRAVENOUS | Status: DC
Start: 2022-01-16 — End: 2022-01-15

## 2022-01-15 MED ORDER — MAGNESIUM HYDROXIDE 400 MG/5ML PO SUSP: 30.0000 mL | Freq: Every day | ORAL | Status: DC | PRN

## 2022-01-15 MED ORDER — VANCOMYCIN HCL 1250 MG/250ML IV SOLN
1250.0000 mg | Freq: Once | INTRAVENOUS | Status: AC
  Administered 2022-01-15: 1250 mg via INTRAVENOUS
  Filled 2022-01-15: qty 250

## 2022-01-15 MED ORDER — SODIUM CHLORIDE 0.9 % IV SOLN: 2.0000 g | Freq: Two times a day (BID) | INTRAVENOUS | Status: DC

## 2022-01-15 MED ORDER — ACETAMINOPHEN 325 MG PO TABS: 650.0000 mg | ORAL_TABLET | Freq: Four times a day (QID) | ORAL | Status: DC | PRN

## 2022-01-15 MED ORDER — POTASSIUM CHLORIDE 10 MEQ/100ML IV SOLN: 10.0000 meq | Freq: Once | INTRAVENOUS | Status: DC

## 2022-01-15 MED ORDER — POTASSIUM CHLORIDE IN NACL 20-0.9 MEQ/L-% IV SOLN
INTRAVENOUS | Status: DC
  Filled 2022-01-15 (×2): qty 1000

## 2022-01-15 MED ORDER — OXYCODONE-ACETAMINOPHEN 5-325 MG PO TABS
2.0000 | ORAL_TABLET | ORAL | Status: DC | PRN
  Administered 2022-01-15: 2 via ORAL
  Filled 2022-01-15: qty 2

## 2022-01-15 MED ORDER — POTASSIUM CHLORIDE 10 MEQ/100ML IV SOLN
10.0000 meq | Freq: Once | INTRAVENOUS | Status: AC
  Administered 2022-01-15: 10 meq via INTRAVENOUS
  Filled 2022-01-15: qty 100

## 2022-01-15 MED ORDER — MORPHINE SULFATE (PF) 2 MG/ML IV SOLN
2.0000 mg | INTRAVENOUS | Status: DC | PRN
  Administered 2022-01-15 (×2): 2 mg via INTRAVENOUS
  Filled 2022-01-15 (×3): qty 1

## 2022-01-15 MED ORDER — ORAL CARE MOUTH RINSE
15.0000 mL | OROMUCOSAL | Status: DC | PRN
Start: 2022-01-15 — End: 2022-01-15

## 2022-01-15 MED ORDER — ONDANSETRON HCL 4 MG PO TABS: 4.0000 mg | ORAL_TABLET | Freq: Four times a day (QID) | ORAL | Status: DC | PRN

## 2022-01-15 MED ORDER — GADOBUTROL 1 MMOL/ML IV SOLN
5.0000 mL | Freq: Once | INTRAVENOUS | Status: AC | PRN
  Administered 2022-01-15: 5 mL via INTRAVENOUS

## 2022-01-15 MED ORDER — ACETAMINOPHEN 650 MG RE SUPP: 650.0000 mg | Freq: Four times a day (QID) | RECTAL | Status: DC | PRN

## 2022-01-15 MED ORDER — ENOXAPARIN SODIUM 40 MG/0.4ML IJ SOSY
40.0000 mg | PREFILLED_SYRINGE | INTRAMUSCULAR | Status: DC
  Administered 2022-01-15: 40 mg via SUBCUTANEOUS
  Filled 2022-01-15: qty 0.4

## 2022-01-15 MED ORDER — TRAZODONE HCL 50 MG PO TABS: 25.0000 mg | ORAL_TABLET | Freq: Every evening | ORAL | Status: DC | PRN

## 2022-01-15 MED ORDER — VANCOMYCIN HCL IN DEXTROSE 1-5 GM/200ML-% IV SOLN: 1000.0000 mg | INTRAVENOUS | Status: DC

## 2022-01-15 MED ORDER — CLONAZEPAM 1 MG PO TABS
1.0000 mg | ORAL_TABLET | Freq: Three times a day (TID) | ORAL | Status: DC | PRN
  Administered 2022-01-15: 1 mg via ORAL
  Filled 2022-01-15: qty 1

## 2022-01-15 MED ORDER — CEFEPIME HCL 2 G IV SOLR
2.0000 g | Freq: Three times a day (TID) | INTRAVENOUS | Status: DC
  Administered 2022-01-15: 2 g via INTRAVENOUS
  Filled 2022-01-15 (×2): qty 12.5

## 2022-01-15 MED ORDER — SODIUM CHLORIDE 0.9 % IV SOLN
2.0000 g | Freq: Once | INTRAVENOUS | Status: AC
  Administered 2022-01-15: 2 g via INTRAVENOUS
  Filled 2022-01-15: qty 12.5

## 2022-01-15 NOTE — Progress Notes (Signed)
Pt left AMA. Had been wanting to leave the floor for outside couple of times this shift and had been told by this writer numerous times that he could not without a doctor's order especially with his hx of IV drug use. Come to find out,pt was wheeled out of the unit by male family member present at bedside. When asked, upon return, why did he leave the unit especially after being told not to leave , pt reported he went out to get some fresh air. Provider made aware. New order placed for pt to not leave the unit. Pt and family, at bedside, all made aware. Within less than an hour later, pt called for this writer to remove PIVs and reported that he was ready to sign papers and leave. Pt reminded that leaving AMA could pose some safety issues as he had not received all treatments for which he was admitted and that lack of treatment could lead to complications up to even death. Pt reported being aware of same and asked to leave. AMA paper presented to patient. Document Signed by patient. Pt left ambulatory and steady.

## 2022-01-15 NOTE — Discharge Summary (Signed)
Physician Discharge Summary   Patient: Earl Moreno MRN: [id number] DOB: [birth year]  Admit date:     01/14/2022  Discharge date: 01/15/22  Discharge Physician: Emeterio Reeve   PCP: Pcp, No   Recommendations at discharge: None, patient left hospital [place]   Discharge Diagnoses: Principal Problem:   Pyelonephritis of right kidney Active Problems:   Hyperglycemia due to type 2 diabetes mellitus ([place])   Hypokalemia   Hyponatremia   Essential hypertension   Pelvic mass   Heroin addiction ([place])   Behavioral problem  Resolved Problems:   * No resolved hospital problems. *  Hospital Course: Earl Moreno is a 56 y.o. male with medical history significant for type 2 diabetes mellitus and IV drug abuse, who presented to the emergency room with acute onset of right sided flank pain with associated urinary frequency and urgency and dysuria without hematuria.  He has been admitted once here and twice in [place] for UTI and pyelonephritis with associated sepsis.  He signed out AMA each time.  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 The patient was having plans for TEE for concern for endocarditis at [facility] recently and left AMA before having it. 08/26-08/27: VSS, slightly hypertensive.  Labs: Hyponatremia 130, hypochloremia 96, hypokalemia 3.2, hyperglycemia 250, lactic acid 1.7, WBC 15.4, anemia slightly below previous levels. 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  Chest x-ray no acute cardiopulmonary disease.  Imaging in ED: MRI pelvis done. Treatment: IV cefepime, IV vancomycin, morphine, Zofran.  Admitted to hospitalist service for pyelonephritis. Cultures pending.  08/27: MRI pelvis w/wo concerning for osteomyelitis.  Patient was advised that he would need specialized care from cardiology for TEE, infectious disease for IV antibiotics, orthopedic surgery for possible procedure to treat osteomyelitis.  Patient was offered pain control, anxiety control, treatment of his medical conditions as below. Patient ended up leaving the hospital AMA, RN reported this to me approximately 3:45 PM today 01/15/2022.  She reported that patient signed AMA form   Consultants:  Infectious disease, Dr. Delaine Lame Orthopedic surgery Dr. Sabra Heck / [facility] Dr. Clayborn Bigness / Jefm Bryant   Procedures: none       Disposition:  left AMA  DISCHARGE MEDICATION: not completed, pt left AMA Allergies as of 01/15/2022   No Known Allergies          Discharge Exam: Filed Weights   01/14/22 2220  Weight: 52.2 kg   Se progress note from earlier today   Condition at discharge: poor  The results of significant diagnostics from this hospitalization (including imaging, microbiology, ancillary and laboratory) are listed below for reference.   Imaging Studies: MR PELVIS W WO CONTRAST  Result Date: 01/15/2022 CLINICAL DATA:  Metastatic disease evaluation; outside CT of the abdomen pelvis showed an osteolytic lesion in the right ischium suspicious for metastatic disease EXAM: MRI PELVIS WITHOUT AND WITH CONTRAST TECHNIQUE: Multiplanar multisequence MR imaging of the pelvis was performed both before and after administration of intravenous contrast. CONTRAST:  56mL GADAVIST GADOBUTROL 1 MMOL/ML IV SOLN COMPARISON:  CT 11/29/2021 FINDINGS: Urinary Tract:  Mild diffuse urinary bladder  wall thickening. Bowel: Mildly thickening of the rectosigmoid colon with mucosal hyperenhancement. No dilated loops of bowel are  evident within the pelvis. Vascular/Lymphatic: No intrapelvic or inguinal lymphadenopathy is evident. Reproductive:  No mass or other significant abnormality Other: Small volume ascites. Findings of cachexia with very little subcutaneous or intrapelvic fat. Musculoskeletal: Bone marrow edema and enhancement of the right ischial tuberosity (series 22, image 41) at the site of the right hamstring tendon origin. Confluent low T1 bone marrow signal at this location. No additional sites of bone marrow edema or marrow replacement. No acute fracture. No dislocation. No femoral head avascular necrosis. Bony pelvis intact without diastasis. SI joints and pubic symphysis within normal limits. No marrow replacing bone lesion. Mild tendinosis of the bilateral hamstring tendon origins. Mild right iliopsoas bursitis. Generalized mild intramuscular edema, nonspecific. No intramuscular fluid collections. No soft tissue wound or ulceration. No fluid collections. No solid or cystic mass within the soft tissues. IMPRESSION: 1. Bone marrow edema and enhancement of the right ischial tuberosity at the site of the right hamstring tendon origin. Although this could be partially secondary to underlying hamstring tendinopathy, appearance is most suspicious for acute osteomyelitis. No overlying soft tissue wound, ulceration, or abscess. 2. No evidence of osseous metastatic disease of the pelvis. 3. Mild right iliopsoas bursitis. 4. Mild diffuse urinary bladder wall thickening, correlate with urinalysis to exclude cystitis. 5. Small volume ascites. Electronically Signed   By: Duanne Guess D.O.   On: 01/15/2022 09:22    Microbiology: Results for orders placed or performed during the hospital encounter of 01/14/22  Culture, blood (routine x 2)     Status: None (Preliminary result)   Collection Time: 01/15/22 12:02 AM   Specimen: BLOOD  Result Value Ref Range Status   Specimen Description BLOOD BLOOD RIGHT WRIST  Final   Special  Requests   Final    BOTTLES DRAWN AEROBIC AND ANAEROBIC Blood Culture adequate volume   Culture   Final    NO GROWTH < 12 HOURS Performed at [facility], [address] [place], [place] [address]    Report Status PENDING  Incomplete  Culture, blood (routine x 2)     Status: None (Preliminary result)   Collection Time: 01/15/22 12:53 AM   Specimen: BLOOD  Result Value Ref Range Status   Specimen Description BLOOD LEFT ANTECUBITAL  Final   Special Requests   Final    BOTTLES DRAWN AEROBIC AND ANAEROBIC Blood Culture adequate volume   Culture   Final    NO GROWTH < 12 HOURS Performed at [facility], [address] [place], [place] [address]    Report Status PENDING  Incomplete    Labs: CBC: Recent Labs  Lab 01/15/22 0003 01/15/22 0547  WBC 15.4* 15.6*  NEUTROABS 12.6*  --   HGB 11.9* 10.5*  HCT 35.4* 30.5*  MCV 84.5 85.0  PLT 224 235   Basic Metabolic Panel: Recent Labs  Lab 01/15/22 0002 01/15/22 0547  NA 130* 132*  K 3.2* 3.1*  CL 96* 101  CO2 24 24  GLUCOSE 250* 235*  BUN 12 12  CREATININE 0.73 0.78  CALCIUM 8.1* 7.8*   Liver Function Tests: Recent Labs  Lab 01/15/22 0002  AST 24  ALT 17  ALKPHOS 110  BILITOT 0.5  PROT 7.6  ALBUMIN 2.4*   CBG: Recent Labs  Lab 01/15/22 0737 01/15/22 1138  GLUCAP 223* 366*    Discharge time spent: less than 30 minutes.  Signed: Dorene Grebe  Lyn Hollingshead, DO [facility] 01/15/2022

## 2022-01-15 NOTE — Hospital Course (Addendum)
Earl Moreno is a 56 y.o. male with medical history significant for type 2 diabetes mellitus and IV drug abuse, who presented to the emergency room with acute onset of right sided flank pain with associated urinary frequency and urgency and dysuria without hematuria.  He has been admitted once here and twice in [place] for UTI and pyelonephritis with associated sepsis.  He signed out AMA each time.  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 The patient was having plans for TEE for concern for endocarditis at [facility] recently and left AMA before having it. 08/26-08/27: VSS, slightly hypertensive.  Labs: Hyponatremia 130, hypochloremia 96, hypokalemia 3.2, hyperglycemia 250, lactic acid 1.7, WBC 15.4, anemia slightly below previous levels. 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  Chest x-ray no acute cardiopulmonary disease. Imaging in ED: MRI pelvis done. Treatment: IV cefepime, IV vancomycin, morphine, Zofran.  Admitted to hospitalist service for pyelonephritis. Cultures pending.  08/27: MRI pelvis w/wo concerning for osteomyelitis.   Consultants: Note, consultants were alerted of the patient however patient left AMA prior to receiving care from the following physicians... Infectious disease, Dr. Rivka Safer Orthopedic surgery Dr. Hyacinth Meeker / [facility] Dr. Juliann Pares / Gavin Potters   Procedures: none      ASSESSMENT & PLAN:   Principal Problem:   Pyelonephritis of right kidney Active Problems:   Hyperglycemia due to type 2  diabetes mellitus (HCC)   Hypokalemia   Hyponatremia   Essential hypertension   Pelvic mass   Heroin addiction (HCC)   Pyelonephritis of right kidney Lactate trending up, WBC stable 15 continue antibiotic therapy with IV cefepime and vancomycin. follow blood cultures as well as urine culture. continue hydration with IV normal saline --> po   Osteomyelitis MRI pelvis (+) Right ischium destructive mass concerning for osteomyelitis, less so for metastatic disease. Consult orthopedics  Consult ID  Pain control as below  Heroin addiction (HCC) CT Abb/Pelv imaging concerning for embolic infarct in spleen and left kidney IVDU w/ heroin  Monitor for withdrawal. monitor for fever for the possibility of infective endocarditis - of note there were apparent plans for TEE at [facility] but patient left AMA prior to this being completed.  Patient states he is amenable for TEE, see below however regarding pain management, behavioral problem  Pain management   Demanding higher dose morphine  Last use of heroin 3 days ago I advised patient I will have him on oral pain management regimen unless he undergoes surgery at which point I will be happy to provide with IV pain medication  Behavioral problem Patient is demanding to go outside "I need some air, I have panic attacks."  I suspect he is withdrawing from heroin.  Offered antianxiety control with p.o. benzodiazepine.  I was firm with patient that I will not be offering IV pain medications unless he has surgery, I have increased dosage/frequency of his p.o. medications.  I advised him that he is free to move about the hospital but needs to let his RN know if this is his plan, needs to cooperate with security, I will not tolerate him leaving  the building while under my care. Patient has been leaving the hospital floor, concern for him leaving the hospital with IV in place as he is a danger to himself given history IV drug use.  Order placed that if patient  goes outside the hospital building, he will be considered to have refused medical treatment / left AMA and will be discharged from hospitalist service and if he desires treatment/readmission he will have to enter through ED triage Patient has capacity to understand the above, has capacity to consent to/refuse medical care  Hyperglycemia due to type 2 diabetes mellitus (HCC) Basal plus SSI  Essential hypertension continue Norvasc.  Hypokalemia potassium replacement  check magnesium level.  Hyponatremia hydrated with IV normal saline and will follow sodium levels.    DVT prophylaxis: Lovenox Pertinent IV fluids/nutrition: IV fluids for hyponatremia. Heart healthy diet  Central lines / invasive devices: none Antibiotics: 01/15/22 is Day 1 w/ cefepime and vancomycin   Code Status: DNR confirmed by admitting hospitalist  Family Communication: none  Disposition: from home, remains inpatient  TOC needs: none at this time Barriers to discharge / significant pending items: pending cultures, imaging, continuing IV abx, consideration for oncology/ID consultation

## 2022-01-15 NOTE — Progress Notes (Signed)
Pharmacy Antibiotic Note  Earl Moreno is a 56 y.o. male admitted on 01/14/2022 with pyelonephritis in setting of IV drug abuse.  Pharmacy has been consulted for Vancomycin dosing.  Plan: Pt given Vancomycin 1250 mg once. Vancomycin 1000 mg IV Q 24 hrs. Goal AUC 400-550. Expected AUC: 425.3 SCr used: 0.73, TBW 52.2 kg < IBW 63.9 kg  Pharmacy will continue to follow and will adjust abx dosing whenever warranted.  Height: 5' 8.11" (173 cm) Weight: 52.2 kg (115 lb) IBW/kg (Calculated) : 68.65  Temp (24hrs), Avg:98.3 F (36.8 C), Min:98.3 F (36.8 C), Max:98.3 F (36.8 C)   Recent Labs  Lab 01/15/22 0002 01/15/22 0003  WBC  --  15.4*  CREATININE 0.73  --   LATICACIDVEN  --  1.7    Estimated Creatinine Clearance: 75.2 mL/min (by C-G formula based on SCr of 0.73 mg/dL).    No Known Allergies  Antimicrobials this admission: 8/27 Cefepime >>  8/27 Vancomycin >>   Microbiology results: 8/27 BCx: Pending 8/27 UCx: Pending   Thank you for allowing pharmacy to be a part of this patient's care.  Otelia Sergeant, PharmD, [facility] 01/15/2022 3:36 AM

## 2022-01-15 NOTE — Assessment & Plan Note (Signed)
-   The patient will be admitted to a medical bed. - We will continue antibiotic therapy with IV cefepime and vancomycin. - We will follow blood cultures as well as urine culture. - We will continue hydration with IV normal saline. - Pain management will be provided.

## 2022-01-15 NOTE — Progress Notes (Signed)
Pharmacy Antibiotic Note  Earl Moreno is a 56 y.o. male admitted on 01/14/2022 with pyelonephritis in setting of IV drug abuse.  Pharmacy has been consulted for Vancomycin dosing.  Plan: Adjust vancomycin to 1250 mg q24H Predicted AUC of 497. Goal AUC of 400-550. Scr 0.8, TBW, Vd 0.72. Recommended to d/c vancomycin at this time. No hx of MRSA infection. Dr. Lyn Hollingshead will assess the patient and d/c if appropriate. Plan to order vancomycin level after 4th or 5th dose.   Continue cefepime 2 g q8H  Pharmacy will continue to follow and will adjust abx dosing whenever warranted.  Height: 5' 8.11" (173 cm) Weight: 52.2 kg (115 lb) IBW/kg (Calculated) : 68.65  Temp (24hrs), Avg:98.7 F (37.1 C), Min:98.3 F (36.8 C), Max:99.1 F (37.3 C)   Recent Labs  Lab 01/15/22 0002 01/15/22 0003 01/15/22 0547 01/15/22 0832  WBC  --  15.4* 15.6*  --   CREATININE 0.73  --  0.78  --   LATICACIDVEN  --  1.7 2.2* 1.7     Estimated Creatinine Clearance: 75.2 mL/min (by C-G formula based on SCr of 0.78 mg/dL).    No Known Allergies  Antimicrobials this admission: 8/27 Cefepime >>  8/27 Vancomycin >>   Microbiology results: 8/27 BCx: Pending 8/27 UCx: Pending   Thank you for allowing pharmacy to be a part of this patient's care.  Paschal Dopp PharmD,  01/15/2022 10:38 AM

## 2022-01-15 NOTE — ED Notes (Signed)
Patient transported to MRI 

## 2022-01-15 NOTE — Assessment & Plan Note (Signed)
-   The patient will be hydrated with IV normal saline and will follow sodium levels.

## 2022-01-15 NOTE — Assessment & Plan Note (Addendum)
-   He will be monitored for withdrawal. - We will monitor for fever for the possibility of infective endocarditis.

## 2022-01-15 NOTE — H&P (Addendum)
[place]   PATIENT NAME: Earl Moreno    MR#:  [id number]  DATE OF BIRTH:  Jun 24, 1965  DATE OF ADMISSION:  01/14/2022  PRIMARY CARE PHYSICIAN: Pcp, No   Patient is coming from: Home  REQUESTING/REFERRING PHYSICIAN: Lurline Hare, MD  CHIEF COMPLAINT:   Chief Complaint  Patient presents with   Back Pain   Dysuria   Urinary Frequency    HISTORY OF PRESENT ILLNESS:  Earl Moreno is a 57 y.o. male with medical history significant for type 2 diabetes mellitus and IV drug abuse, who presented to the emergency room with acute onset of right sided flank pain with associated urinary frequency and urgency and dysuria without hematuria.  He has been admitted once here and twice in [name] for UTI and pyelonephritis with associated sepsis.  He signed out AMA each time.  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  He denies any chest pain or dyspnea or cough.  No nausea or vomiting or lower abdominal pain.  No fever or chills.  The patient was having plans for TEE for concern for endocarditis at [facility] recently and left AMA before having it.  ED Course: Upon presentation to the emergency room, BP was 143/91 with otherwise normal vital signs.  Labs revealed hyponatremia 130 with hypochloremia 96 with hypokalemia 3.2 and blood glucose of 250, calcium of 8.1 and albumin 2.4 with total protein of 7.6.  Lactic acid was 1.7 CBC showed leukocytosis of 15.4 with neutrophilia and anemia slightly worse than previous levels.  Imaging: The patient had a recent MRI of CTL S spine with and without contrast on 8/23 that revealed no finding suggestive infection. Chest CTA with contrast showed airway inflammation and coronary artery disease on 8/23. 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  Portable chest x-ray then showed no acute cardiopulmonary disease.  MRI of the pelvis with and without contrast was ordered tonight right and the report is currently pending.  The patient was given IV cefepime and vancomycin as well as 4 mg of IV morphine sulfate and 4 mg of IV Zofran.  He will be admitted to a medical bed for further evaluation and management. PAST MEDICAL HISTORY:   Past Medical History:  Diagnosis Date   Diabetes mellitus without complication ([place])   -IV drug abuse  PAST SURGICAL HISTORY:   Past Surgical History:  Procedure Laterality Date   I & D EXTREMITY Left 06/05/2018   Procedure: IRRIGATION AND DEBRIDEMENT EXTREMITY-LEFT HAND;  Surgeon: Lovell Sheehan, MD;  Location: [facility] ORS;  Service: Orthopedics;  Laterality: Left;    SOCIAL HISTORY:   Social History   Tobacco Use   Smoking status: Every Day    Packs/day: 0.50    Types: Cigarettes   Smokeless tobacco: Never  Substance Use Topics   Alcohol use: No  Positive for IV drug abuse with heroin addiction.  FAMILY HISTORY:  Family History  Problem Relation Age of Onset   Diabetes Mother     DRUG ALLERGIES:  No Known Allergies  REVIEW OF SYSTEMS:   ROS As per history of present illness. All pertinent systems were reviewed above. Constitutional, HEENT, cardiovascular, respiratory, GI, GU, musculoskeletal, neuro, psychiatric, endocrine, integumentary and hematologic systems were reviewed and are otherwise negative/unremarkable except for  positive findings mentioned above in the HPI.   MEDICATIONS AT HOME:   Prior to Admission medications   Medication Sig Start Date End Date Taking? Authorizing Provider  amLODipine (NORVASC) 10 MG tablet Take 1 tablet (10 mg total) by mouth daily. 12/02/21  Yes Ghimire, Dante Gang, MD  Insulin Lispro Prot & Lispro (HUMALOG MIX 75/25 KWIKPEN) (75-25) 100 UNIT/ML Kwikpen Inject 18 Units into the skin 2 (two) times daily. 12/02/21  Yes Barb Merino, MD  levofloxacin (LEVAQUIN) 750 MG tablet Take 750 mg by mouth daily. 01/12/22 01/26/22 Yes [provider]  Baclofen 5 MG TABS Take 5 mg by mouth 3 (three) times daily as needed for muscle spasms. 12/02/21   Barb Merino, MD  Blood Glucose Monitoring Suppl (BLOOD GLUCOSE MONITOR SYSTEM) w/Device KIT USE UP TO 4 TIMES DAILY AS DIRECTED. 02/26/21   [name], MD  Blood Glucose Monitoring Suppl (RIGHTEST [id number] BLOOD GLUCOSE) w/Device KIT Use as directed to test blood sugar up to 4 times daily 12/02/21   Barb Merino, MD  glucose blood (RIGHTEST GS550 BLOOD GLUCOSE) test strip Use as directed to check blood sugar up to 4 times daily 12/02/21   Barb Merino, MD  Insulin Pen Needle (COMFORT EZ PEN NEEDLES) 32G X 4 MM MISC USE AS DIRECTED TWICE DAILY (MORNING AND BEDTIME) 12/02/21   Barb Merino, MD  Rightest GL300 Lancets MISC USE AS DIRECTED UP TO 4 TIMES DAILY. 12/02/21   Barb Merino, MD      VITAL SIGNS:  Blood pressure 137/84, pulse 81, temperature 98.3 F (36.8 C), temperature source Oral, resp. rate 18, height 5' 8.11" (1.73 m), weight 52.2 kg, SpO2 97 %.  PHYSICAL EXAMINATION:  Physical Exam  GENERAL:  56 y.o.-year-old cachectic Caucasian male patient lying in the bed with no acute distress.  EYES: Pupils equal, round, reactive to light and accommodation. No scleral icterus. Extraocular muscles intact.  HEENT: Head atraumatic, normocephalic. Oropharynx and nasopharynx clear.  NECK:  Supple, no jugular venous distention. No thyroid  enlargement, no tenderness.  LUNGS: Normal breath sounds bilaterally, no wheezing, rales,rhonchi or crepitation. No use of accessory muscles of respiration.  CARDIOVASCULAR: Regular rate and rhythm, S1, S2 normal. No murmurs, rubs, or gallops.  ABDOMEN: Soft, nondistended, nontender. Bowel sounds present. No organomegaly or mass.  Right CVA tenderness. EXTREMITIES: No pedal edema, cyanosis, or clubbing.  NEUROLOGIC: Cranial nerves II through XII are intact. Muscle strength 5/5 in all extremities. Sensation intact. Gait not checked.  PSYCHIATRIC: The patient is alert and oriented x 3.  Normal affect and good eye contact. SKIN: No obvious rash, lesion, or ulcer.   LABORATORY PANEL:   CBC Recent Labs  Lab 01/15/22 0003  WBC 15.4*  HGB 11.9*  HCT 35.4*  PLT 224   ------------------------------------------------------------------------------------------------------------------  Chemistries  Recent Labs  Lab 01/15/22 0002  NA 130*  K 3.2*  CL 96*  CO2 24  GLUCOSE 250*  BUN 12  CREATININE 0.73  CALCIUM 8.1*  AST 24  ALT 17  ALKPHOS 110  BILITOT 0.5   ------------------------------------------------------------------------------------------------------------------  Cardiac Enzymes No results for input(s): "TROPONINI" in the last 168 hours. ------------------------------------------------------------------------------------------------------------------  RADIOLOGY:  No results found.    IMPRESSION AND PLAN:  Assessment and Plan: * Pyelonephritis of right kidney - The patient will be admitted to a medical bed. - We will continue antibiotic therapy with IV cefepime and vancomycin. - We will follow blood cultures as well as urine culture. - We will continue hydration with IV normal saline. - Pain management will be provided.  Hypokalemia - We will continue potassium replacement and check magnesium level.  Hyperglycemia due to type 2 diabetes mellitus ([place]) - The  patient will be placed on supplement coverage with NovoLog. - We will continue basal coverage.  Hyponatremia - The patient will be hydrated with IV normal saline and will follow sodium levels.  Pelvic mass - He has an incidental right ischium destructive mass. - Pelvic MRI is currently pending.  Essential hypertension - We will continue Norvasc.  Heroin addiction ([place]) - He will be monitored for withdrawal. - We will monitor for fever for the possibility of infective endocarditis.   DVT prophylaxis: Lovenox.  Advanced Care Planning:  Code Status: The patient is DNR/DNI.  This was discussed with him. Family Communication:  The plan of care was discussed in details with the patient (and family). I answered all questions. The patient agreed to proceed with the above mentioned plan. Further management will depend upon hospital course. Disposition Plan: Back to previous home environment Consults called: none.  All the records are reviewed and case discussed with ED provider.  Status is: Inpatient  At the time of the admission, it appears that the appropriate admission status for this patient is inpatient.  This is judged to be reasonable and necessary in order to provide the required intensity of service to ensure the patient's safety given the presenting symptoms, physical exam findings and initial radiographic and laboratory data in the context of comorbid conditions.  The patient requires inpatient status due to high intensity of service, high risk of further deterioration and high frequency of surveillance required.  I certify that at the time of admission, it is my clinical judgment that the patient will require inpatient hospital care extending more than 2 midnights.                            Dispo: The patient is from: Home              Anticipated d/c is to: Home              Patient currently is not medically stable to d/c.              Difficult to place patient: No  Christel Mormon M.D on 01/15/2022 at 4:43 AM  [facility]   From 7 PM-7 AM, contact night-coverage [email or web address]  CC: Primary care physician; Pcp, No

## 2022-01-15 NOTE — Progress Notes (Addendum)
PROGRESS NOTE    Earl Moreno   WTU:[id number] DOB: [birth year]  DOA: 01/14/2022 Date of Service: 01/15/22 PCP: Oneita Hurt, No     Brief Narrative / Hospital Course:  Earl Moreno is a 56 y.o. male with medical history significant for type 2 diabetes mellitus and IV drug abuse, who presented to the emergency room with acute onset of right sided flank pain with associated urinary frequency and urgency and dysuria without hematuria.  He has been admitted once here and twice in [place] for UTI and pyelonephritis with associated sepsis.  He signed out AMA each time.  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 The patient was having plans for TEE for concern for endocarditis at [facility] recently and left AMA before having it. 08/26-08/27: VSS, slightly hypertensive.  Labs: Hyponatremia 130, hypochloremia 96, hypokalemia 3.2, hyperglycemia 250, lactic acid 1.7, WBC 15.4, anemia slightly below previous levels. 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  Chest x-ray no acute cardiopulmonary disease. Imaging in ED: MRI pelvis done. Treatment: IV cefepime, IV vancomycin, morphine, Zofran.  Admitted to hospitalist service for pyelonephritis. Cultures pending.  08/27: MRI pelvis w/wo concerning for osteomyelitis.   Consultants:  Infectious disease, Dr. Rivka Safer Orthopedic surgery Dr. Hyacinth Meeker / [facility] Dr. Juliann Pares / Gavin Potters   Procedures: none      ASSESSMENT & PLAN:   Principal Problem:   Pyelonephritis of right  kidney Active Problems:   Hyperglycemia due to type 2 diabetes mellitus (HCC)   Hypokalemia   Hyponatremia   Essential hypertension   Pelvic mass   Heroin addiction (HCC)   Pyelonephritis of right kidney Lactate trending up, WBC stable 15 continue antibiotic therapy with IV cefepime and vancomycin. follow blood cultures as well as urine culture. continue hydration with IV normal saline --> po   Osteomyelitis MRI pelvis (+) Right ischium destructive mass concerning for osteomyelitis, less so for metastatic disease. Consult orthopedics  Consult ID  Pain control as below  Heroin addiction (HCC) CT Abb/Pelv imaging concerning for embolic infarct in spleen and left kidney IVDU w/ heroin  Monitor for withdrawal. monitor for fever for the possibility of infective endocarditis - of note there were apparent plans for TEE at [facility] but patient left AMA prior to this being completed.  Patient states he is amenable for TEE, see below however regarding pain management, behavioral problem  Pain management   Demanding higher dose morphine  Last use of heroin 3 days ago I advised patient I will have him on oral pain management regimen unless he undergoes surgery at which point I will be happy to provide with IV pain medication  Behavioral problem Patient is demanding to go outside "I need some air, I have panic attacks."  I suspect he is withdrawing from heroin.  Offered antianxiety control with p.o. benzodiazepine.  I was firm with patient that I will not be offering IV pain medications unless he has surgery, I have increased dosage/frequency of his p.o. medications.  I advised him that he is free to move about the hospital but needs to let his RN know if  this is his plan, needs to cooperate with security, I will not tolerate him leaving the building while under my care. Patient has been leaving the hospital floor, concern for him leaving the hospital with IV in place as he is a danger to himself  given history IV drug use.  Order placed that if patient goes outside the hospital building, he will be considered to have refused medical treatment / left AMA and will be discharged from hospitalist service and if he desires treatment/readmission he will have to enter through ED triage Patient has capacity to understand the above, has capacity to consent to/refuse medical care  Hyperglycemia due to type 2 diabetes mellitus (HCC) Basal plus SSI  Essential hypertension continue Norvasc.  Hypokalemia potassium replacement  check magnesium level.  Hyponatremia hydrated with IV normal saline and will follow sodium levels.    DVT prophylaxis: Lovenox Pertinent IV fluids/nutrition: IV fluids for hyponatremia. Heart healthy diet  Central lines / invasive devices: none Antibiotics: 01/15/22 is Day 1 w/ cefepime and vancomycin   Code Status: DNR confirmed by admitting hospitalist  Family Communication: none  Disposition: from home, remains inpatient  TOC needs: none at this time Barriers to discharge / significant pending items: pending cultures, imaging, continuing IV abx, consideration for oncology/ID consultation              Subjective:  Patient reports lower back pain, no fever/chills, no chest pain or shortness of breath.  Is asking for higher dose pain medications.  Asking about leaving the hospital, concerned about anxiety.       Objective:  Vitals:   01/15/22 0413 01/15/22 0445 01/15/22 0510 01/15/22 0901  BP: 137/84 130/80 133/79 139/86  Pulse: 81 78 80 79  Resp: 18 16 17 16   Temp:  99.1 F (37.3 C) 98.5 F (36.9 C) 98.7 F (37.1 C)  TempSrc:  Oral  Oral  SpO2: 97% 99% 100% 99%  Weight:      Height:        Intake/Output Summary (Last 24 hours) at 01/15/2022 1436 Last data filed at 01/15/2022 1412 Gross per 24 hour  Intake 420 ml  Output 1500 ml  Net -1080 ml   Filed Weights   01/14/22 2220  Weight: 52.2 kg    Examination:  Constitutional:   VS as above General Appearance: alert, cachectic, NAD Neck: No masses, trachea midline Respiratory: Normal respiratory effort No wheeze No rhonchi No rales Cardiovascular: S1/S2 normal No murmur No rub/gallop auscultated No lower extremity edema Gastrointestinal: No tenderness No masses No hernia appreciated Musculoskeletal:  No clubbing/cyanosis of digits Symmetrical movement in all extremities Neurological: No cranial nerve deficit on limited exam Alert Psychiatric: Normal judgment/insight Agitated mood and affect       Scheduled Medications:   enoxaparin (LOVENOX) injection  40 mg Subcutaneous Q24H    Continuous Infusions:  0.9 % NaCl with KCl 20 mEq / L 100 mL/hr at 01/15/22 01/17/22   ceFEPime (MAXIPIME) IV 2 g (01/15/22 1433)   potassium chloride      PRN Medications:  acetaminophen **OR** acetaminophen, clonazePAM, magnesium hydroxide, ondansetron **OR** ondansetron (ZOFRAN) IV, mouth rinse, oxyCODONE-acetaminophen, traZODone  Antimicrobials:  Anti-infectives (From admission, onward)    Start     Dose/Rate Route Frequency Ordered Stop   01/16/22 1000  vancomycin (VANCOREADY) IVPB 1250 mg/250 mL  Status:  Discontinued        1,250 mg 166.7 mL/hr over 90 Minutes Intravenous Every 24 hours 01/15/22 1048 01/15/22 1049   01/16/22 0000  vancomycin (VANCOCIN) IVPB 1000 mg/200 mL premix  Status:  Discontinued        1,000 mg 200 mL/hr over 60 Minutes Intravenous Every 24 hours 01/15/22 0339 01/15/22 1048   01/15/22 1900  ceFEPIme (MAXIPIME) 2 g in sodium chloride 0.9 % 100 mL IVPB  Status:  Discontinued        2 g 200 mL/hr over 30 Minutes Intravenous Every 12 hours 01/15/22 0323 01/15/22 1048   01/15/22 1400  ceFEPIme (MAXIPIME) 2 g in sodium chloride 0.9 % 100 mL IVPB        2 g 200 mL/hr over 30 Minutes Intravenous Every 8 hours 01/15/22 1048     01/15/22 0245  ceFEPIme (MAXIPIME) 2 g in sodium chloride 0.9 % 100 mL IVPB        2 g 200 mL/hr over 30  Minutes Intravenous  Once 01/15/22 0244 01/15/22 0411   01/15/22 0245  vancomycin (VANCOREADY) IVPB 1250 mg/250 mL        1,250 mg 166.7 mL/hr over 90 Minutes Intravenous  Once 01/15/22 0244 01/15/22 0504       Data Reviewed: I have personally reviewed following labs and imaging studies  CBC: Recent Labs  Lab 01/15/22 0003 01/15/22 0547  WBC 15.4* 15.6*  NEUTROABS 12.6*  --   HGB 11.9* 10.5*  HCT 35.4* 30.5*  MCV 84.5 85.0  PLT 224 235   Basic Metabolic Panel: Recent Labs  Lab 01/15/22 0002 01/15/22 0547  NA 130* 132*  K 3.2* 3.1*  CL 96* 101  CO2 24 24  GLUCOSE 250* 235*  BUN 12 12  CREATININE 0.73 0.78  CALCIUM 8.1* 7.8*   GFR: Estimated Creatinine Clearance: 75.2 mL/min (by C-G formula based on SCr of 0.78 mg/dL). Liver Function Tests: Recent Labs  Lab 01/15/22 0002  AST 24  ALT 17  ALKPHOS 110  BILITOT 0.5  PROT 7.6  ALBUMIN 2.4*   No results for input(s): "LIPASE", "AMYLASE" in the last 168 hours. No results for input(s): "AMMONIA" in the last 168 hours. Coagulation Profile: No results for input(s): "INR", "PROTIME" in the last 168 hours. Cardiac Enzymes: No results for input(s): "CKTOTAL", "CKMB", "CKMBINDEX", "TROPONINI" in the last 168 hours. BNP (last 3 results) No results for input(s): "PROBNP" in the last 8760 hours. HbA1C: No results for input(s): "HGBA1C" in the last 72 hours. CBG: Recent Labs  Lab 01/15/22 0737 01/15/22 1138  GLUCAP 223* 366*   Lipid Profile: No results for input(s): "CHOL", "HDL", "LDLCALC", "TRIG", "CHOLHDL", "LDLDIRECT" in the last 72 hours. Thyroid Function Tests: No results for input(s): "TSH", "T4TOTAL", "FREET4", "T3FREE", "THYROIDAB" in the last 72 hours. Anemia Panel: No results for input(s): "VITAMINB12", "FOLATE", "FERRITIN", "TIBC", "IRON", "RETICCTPCT" in the last 72 hours. Urine analysis:    Component Value Date/Time   COLORURINE STRAW (A) 01/15/2022 0003   APPEARANCEUR CLEAR (A) 01/15/2022 0003    APPEARANCEUR Cloudy 12/30/2012 1018   LABSPEC 1.003 (L) 01/15/2022 0003   LABSPEC 1.017 12/30/2012 1018   PHURINE 7.0 01/15/2022 0003   GLUCOSEU NEGATIVE 01/15/2022 0003   GLUCOSEU Negative 12/30/2012 1018   HGBUR SMALL (A) 01/15/2022 0003   BILIRUBINUR NEGATIVE 01/15/2022 0003   BILIRUBINUR Negative 12/30/2012 1018   KETONESUR NEGATIVE 01/15/2022 0003   PROTEINUR NEGATIVE 01/15/2022 0003   NITRITE NEGATIVE 01/15/2022 0003   LEUKOCYTESUR SMALL (A) 01/15/2022 0003   LEUKOCYTESUR Negative 12/30/2012 1018   Sepsis Labs: @LABRCNTIP (procalcitonin:4,lacticidven:4)  Recent Results (from the past 240 hour(s))  Culture, blood (routine x 2)  Status: None (Preliminary result)   Collection Time: 01/15/22 12:02 AM   Specimen: BLOOD  Result Value Ref Range Status   Specimen Description BLOOD BLOOD RIGHT WRIST  Final   Special Requests   Final    BOTTLES DRAWN AEROBIC AND ANAEROBIC Blood Culture adequate volume   Culture   Final    NO GROWTH < 12 HOURS Performed at [facility], [address] [place], [place] [address]    Report Status PENDING  Incomplete  Culture, blood (routine x 2)     Status: None (Preliminary result)   Collection Time: 01/15/22 12:53 AM   Specimen: BLOOD  Result Value Ref Range Status   Specimen Description BLOOD LEFT ANTECUBITAL  Final   Special Requests   Final    BOTTLES DRAWN AEROBIC AND ANAEROBIC Blood Culture adequate volume   Culture   Final    NO GROWTH < 12 HOURS Performed at [facility], [address], [place], [place] [address]    Report Status PENDING  Incomplete         Radiology Studies: MR PELVIS W WO CONTRAST  Result Date: 01/15/2022 CLINICAL DATA:  Metastatic disease evaluation; outside CT of the abdomen pelvis showed an osteolytic lesion in the right ischium suspicious for metastatic disease EXAM: MRI PELVIS WITHOUT AND WITH CONTRAST TECHNIQUE: Multiplanar multisequence MR imaging of the pelvis was performed  both before and after administration of intravenous contrast. CONTRAST:  13mL GADAVIST GADOBUTROL 1 MMOL/ML IV SOLN COMPARISON:  CT 11/29/2021 FINDINGS: Urinary Tract:  Mild diffuse urinary bladder wall thickening. Bowel: Mildly thickening of the rectosigmoid colon with mucosal hyperenhancement. No dilated loops of bowel are evident within the pelvis. Vascular/Lymphatic: No intrapelvic or inguinal lymphadenopathy is evident. Reproductive:  No mass or other significant abnormality Other: Small volume ascites. Findings of cachexia with very little subcutaneous or intrapelvic fat. Musculoskeletal: Bone marrow edema and enhancement of the right ischial tuberosity (series 22, image 41) at the site of the right hamstring tendon origin. Confluent low T1 bone marrow signal at this location. No additional sites of bone marrow edema or marrow replacement. No acute fracture. No dislocation. No femoral head avascular necrosis. Bony pelvis intact without diastasis. SI joints and pubic symphysis within normal limits. No marrow replacing bone lesion. Mild tendinosis of the bilateral hamstring tendon origins. Mild right iliopsoas bursitis. Generalized mild intramuscular edema, nonspecific. No intramuscular fluid collections. No soft tissue wound or ulceration. No fluid collections. No solid or cystic mass within the soft tissues. IMPRESSION: 1. Bone marrow edema and enhancement of the right ischial tuberosity at the site of the right hamstring tendon origin. Although this could be partially secondary to underlying hamstring tendinopathy, appearance is most suspicious for acute osteomyelitis. No overlying soft tissue wound, ulceration, or abscess. 2. No evidence of osseous metastatic disease of the pelvis. 3. Mild right iliopsoas bursitis. 4. Mild diffuse urinary bladder wall thickening, correlate with urinalysis to exclude cystitis. 5. Small volume ascites. Electronically Signed   By: Duanne Guess D.O.   On: 01/15/2022 09:22             LOS: 0 days    Time spent: 35 min    Sunnie Nielsen, DO [facility] 01/15/2022, 2:36 PM   Staff may message me via secure chat in Epic  but this may not receive immediate response,  please page for urgent matters!  If 7PM-7AM, please contact night-coverage [email or web address]  Dictation software was used to generate the above note. Typos may occur and  escape review, as with typed/written notes. Please contact Dr Sheppard Coil directly for clarity if needed.

## 2022-01-15 NOTE — ED Provider Notes (Signed)
-----------------------------------------   2:34 AM on 01/15/2022 -----------------------------------------   Patient has had MRI.  Results may not be back overnight.  I have personally reviewed patient's records from [facility] on 01/12/2022 which noted patient was responding well to cefepime and vancomycin before he signed AMA.  They also had plans to perform TEE for concerns for endocarditis.  Have ordered IV antibiotics.  Will consult hospitalist services for evaluation and admission.   Irean Hong, MD 01/15/22 [phone]

## 2022-01-15 NOTE — Assessment & Plan Note (Signed)
-   The patient will be placed on supplement coverage with NovoLog. ?- We will continue basal coverage. ? ?

## 2022-01-15 NOTE — Progress Notes (Signed)
PHARMACY -  BRIEF ANTIBIOTIC NOTE   Pharmacy has received consult(s) for Cefepime & Vancomycin from an OR provider.  The patient's profile has been reviewed for ht/wt/allergies/indication/available labs.    One time order(s) placed for Cefepime 2 gm & Vancomycin 1250 mg per pt wt 52.2 kg.  Further antibiotics/pharmacy consults should be ordered by admitting physician if indicated.                       Thank you, Otelia Sergeant, PharmD, [facility] 01/15/2022 2:45 AM

## 2022-01-15 NOTE — Assessment & Plan Note (Signed)
We will continue Norvasc. 

## 2022-01-15 NOTE — Progress Notes (Signed)
Unable to complete pt admission questions due to pt unable to stay awake to answer questions.Upcoming RN notified  and aware to complete the pt admission questions.

## 2022-01-15 NOTE — Assessment & Plan Note (Signed)
-   We will continue potassium replacement and check magnesium level.

## 2022-01-15 NOTE — Assessment & Plan Note (Signed)
-   He has an incidental right ischium destructive mass. - Pelvic MRI is currently pending.

## 2022-01-15 NOTE — ED Notes (Signed)
Patient is resting comfortably. 

## 2022-01-16 LAB — URINE CULTURE: Culture: NO GROWTH

## 2022-01-20 LAB — CULTURE, BLOOD (ROUTINE X 2)
Culture: NO GROWTH
Culture: NO GROWTH
Special Requests: ADEQUATE
Special Requests: ADEQUATE

## 2022-01-21 ENCOUNTER — Emergency Department: Payer: Medicaid Other

## 2022-01-21 ENCOUNTER — Inpatient Hospital Stay
Admission: EM | Admit: 2022-01-21 | Discharge: 2022-02-25 | DRG: 871 | Disposition: A | Discharge status: Hospice | Payer: Medicaid Other | Attending: Internal Medicine | Admitting: Internal Medicine

## 2022-01-21 DIAGNOSIS — D696 Thrombocytopenia, unspecified: Secondary | ICD-10-CM | POA: Diagnosis present

## 2022-01-21 DIAGNOSIS — D649 Anemia, unspecified: Secondary | ICD-10-CM | POA: Diagnosis present

## 2022-01-21 DIAGNOSIS — E871 Hypo-osmolality and hyponatremia: Secondary | ICD-10-CM | POA: Diagnosis present

## 2022-01-21 DIAGNOSIS — R4182 Altered mental status, unspecified: Secondary | ICD-10-CM | POA: Diagnosis present

## 2022-01-21 DIAGNOSIS — R233 Spontaneous ecchymoses: Secondary | ICD-10-CM | POA: Diagnosis present

## 2022-01-21 DIAGNOSIS — E119 Type 2 diabetes mellitus without complications: Secondary | ICD-10-CM

## 2022-01-21 DIAGNOSIS — Z91199 Patient's noncompliance with other medical treatment and regimen due to unspecified reason: Secondary | ICD-10-CM

## 2022-01-21 DIAGNOSIS — I2489 Other forms of acute ischemic heart disease: Secondary | ICD-10-CM | POA: Diagnosis present

## 2022-01-21 DIAGNOSIS — E876 Hypokalemia: Secondary | ICD-10-CM | POA: Diagnosis present

## 2022-01-21 DIAGNOSIS — F1920 Other psychoactive substance dependence, uncomplicated: Secondary | ICD-10-CM | POA: Diagnosis present

## 2022-01-21 DIAGNOSIS — R451 Restlessness and agitation: Secondary | ICD-10-CM

## 2022-01-21 DIAGNOSIS — A4153 Sepsis due to Serratia: Principal | ICD-10-CM | POA: Diagnosis present

## 2022-01-21 DIAGNOSIS — Z681 Body mass index (BMI) 19 or less, adult: Secondary | ICD-10-CM

## 2022-01-21 DIAGNOSIS — Z5902 Unsheltered homelessness: Secondary | ICD-10-CM

## 2022-01-21 DIAGNOSIS — Z833 Family history of diabetes mellitus: Secondary | ICD-10-CM

## 2022-01-21 DIAGNOSIS — R7881 Bacteremia: Secondary | ICD-10-CM | POA: Diagnosis present

## 2022-01-21 DIAGNOSIS — F1123 Opioid dependence with withdrawal: Secondary | ICD-10-CM | POA: Diagnosis not present

## 2022-01-21 DIAGNOSIS — D509 Iron deficiency anemia, unspecified: Secondary | ICD-10-CM | POA: Diagnosis present

## 2022-01-21 DIAGNOSIS — E43 Unspecified severe protein-calorie malnutrition: Secondary | ICD-10-CM | POA: Diagnosis present

## 2022-01-21 DIAGNOSIS — E872 Acidosis, unspecified: Secondary | ICD-10-CM | POA: Diagnosis present

## 2022-01-21 DIAGNOSIS — Z8673 Personal history of transient ischemic attack (TIA), and cerebral infarction without residual deficits: Secondary | ICD-10-CM

## 2022-01-21 DIAGNOSIS — Z634 Disappearance and death of family member: Secondary | ICD-10-CM

## 2022-01-21 DIAGNOSIS — L89326 Pressure-induced deep tissue damage of left buttock: Secondary | ICD-10-CM | POA: Diagnosis not present

## 2022-01-21 DIAGNOSIS — I110 Hypertensive heart disease with heart failure: Secondary | ICD-10-CM | POA: Diagnosis present

## 2022-01-21 DIAGNOSIS — Y92230 Patient room in hospital as the place of occurrence of the external cause: Secondary | ICD-10-CM | POA: Diagnosis not present

## 2022-01-21 DIAGNOSIS — I5021 Acute systolic (congestive) heart failure: Secondary | ICD-10-CM | POA: Diagnosis present

## 2022-01-21 DIAGNOSIS — D638 Anemia in other chronic diseases classified elsewhere: Secondary | ICD-10-CM | POA: Diagnosis present

## 2022-01-21 DIAGNOSIS — Z515 Encounter for palliative care: Secondary | ICD-10-CM

## 2022-01-21 DIAGNOSIS — I358 Other nonrheumatic aortic valve disorders: Secondary | ICD-10-CM | POA: Diagnosis present

## 2022-01-21 DIAGNOSIS — A419 Sepsis, unspecified organism: Secondary | ICD-10-CM | POA: Diagnosis present

## 2022-01-21 DIAGNOSIS — G060 Intracranial abscess and granuloma: Secondary | ICD-10-CM | POA: Diagnosis present

## 2022-01-21 DIAGNOSIS — I10 Essential (primary) hypertension: Secondary | ICD-10-CM | POA: Diagnosis present

## 2022-01-21 DIAGNOSIS — Z794 Long term (current) use of insulin: Secondary | ICD-10-CM

## 2022-01-21 DIAGNOSIS — F1410 Cocaine abuse, uncomplicated: Secondary | ICD-10-CM | POA: Diagnosis present

## 2022-01-21 DIAGNOSIS — R54 Age-related physical debility: Secondary | ICD-10-CM | POA: Diagnosis present

## 2022-01-21 DIAGNOSIS — R161 Splenomegaly, not elsewhere classified: Secondary | ICD-10-CM | POA: Diagnosis present

## 2022-01-21 DIAGNOSIS — T405X1A Poisoning by cocaine, accidental (unintentional), initial encounter: Secondary | ICD-10-CM | POA: Diagnosis present

## 2022-01-21 DIAGNOSIS — F1910 Other psychoactive substance abuse, uncomplicated: Secondary | ICD-10-CM

## 2022-01-21 DIAGNOSIS — I76 Septic arterial embolism: Secondary | ICD-10-CM | POA: Diagnosis present

## 2022-01-21 DIAGNOSIS — G928 Other toxic encephalopathy: Secondary | ICD-10-CM | POA: Diagnosis present

## 2022-01-21 DIAGNOSIS — T403X1A Poisoning by methadone, accidental (unintentional), initial encounter: Secondary | ICD-10-CM | POA: Diagnosis present

## 2022-01-21 DIAGNOSIS — M869 Osteomyelitis, unspecified: Secondary | ICD-10-CM | POA: Diagnosis present

## 2022-01-21 DIAGNOSIS — R6521 Severe sepsis with septic shock: Secondary | ICD-10-CM | POA: Diagnosis present

## 2022-01-21 DIAGNOSIS — E1165 Type 2 diabetes mellitus with hyperglycemia: Secondary | ICD-10-CM | POA: Diagnosis present

## 2022-01-21 DIAGNOSIS — I639 Cerebral infarction, unspecified: Secondary | ICD-10-CM | POA: Diagnosis present

## 2022-01-21 DIAGNOSIS — W06XXXA Fall from bed, initial encounter: Secondary | ICD-10-CM | POA: Diagnosis not present

## 2022-01-21 DIAGNOSIS — I669 Occlusion and stenosis of unspecified cerebral artery: Secondary | ICD-10-CM | POA: Diagnosis present

## 2022-01-21 DIAGNOSIS — Z66 Do not resuscitate: Secondary | ICD-10-CM | POA: Diagnosis present

## 2022-01-21 DIAGNOSIS — Z87440 Personal history of urinary (tract) infections: Secondary | ICD-10-CM

## 2022-01-21 DIAGNOSIS — Z7189 Other specified counseling: Secondary | ICD-10-CM

## 2022-01-21 DIAGNOSIS — I330 Acute and subacute infective endocarditis: Secondary | ICD-10-CM | POA: Diagnosis present

## 2022-01-21 DIAGNOSIS — Z20822 Contact with and (suspected) exposure to covid-19: Secondary | ICD-10-CM | POA: Diagnosis present

## 2022-01-21 DIAGNOSIS — Z79899 Other long term (current) drug therapy: Secondary | ICD-10-CM

## 2022-01-21 DIAGNOSIS — M25512 Pain in left shoulder: Secondary | ICD-10-CM | POA: Diagnosis present

## 2022-01-21 DIAGNOSIS — F17210 Nicotine dependence, cigarettes, uncomplicated: Secondary | ICD-10-CM | POA: Diagnosis present

## 2022-01-21 MED ORDER — LACTATED RINGERS IV BOLUS (SEPSIS)
2000.0000 mL | Freq: Once | INTRAVENOUS | Status: AC
  Administered 2022-01-22: 2000 mL via INTRAVENOUS

## 2022-01-21 MED ORDER — ZIPRASIDONE MESYLATE 20 MG IM SOLR
20.0000 mg | Freq: Once | INTRAMUSCULAR | Status: AC
  Administered 2022-01-22: 20 mg via INTRAMUSCULAR
  Filled 2022-01-21: qty 20

## 2022-01-21 MED ORDER — LACTATED RINGERS IV SOLN: INTRAVENOUS | Status: DC

## 2022-01-21 MED ORDER — METRONIDAZOLE 500 MG/100ML IV SOLN
500.0000 mg | Freq: Once | INTRAVENOUS | Status: AC
  Administered 2022-01-22: 500 mg via INTRAVENOUS

## 2022-01-21 MED ORDER — SODIUM CHLORIDE 0.9 % IV SOLN
2.0000 g | Freq: Once | INTRAVENOUS | Status: AC
  Administered 2022-01-22: 2 g via INTRAVENOUS
  Filled 2022-01-21: qty 12.5

## 2022-01-21 MED ORDER — VANCOMYCIN HCL IN DEXTROSE 1-5 GM/200ML-% IV SOLN: 1000.0000 mg | Freq: Once | INTRAVENOUS | Status: DC

## 2022-01-21 MED ORDER — ACETAMINOPHEN 325 MG PO TABS
650.0000 mg | ORAL_TABLET | Freq: Once | ORAL | Status: AC
  Administered 2022-01-21: 650 mg via ORAL
  Filled 2022-01-21: qty 2

## 2022-01-21 MED ORDER — VANCOMYCIN HCL 1250 MG/250ML IV SOLN
1250.0000 mg | Freq: Once | INTRAVENOUS | Status: DC
  Filled 2022-01-21 (×2): qty 250

## 2022-01-21 NOTE — ED Notes (Signed)
This RN has made multiple attempts to get lab work from pt, pt keeps refusing stating "I just want to rest."

## 2022-01-21 NOTE — Progress Notes (Signed)
PHARMACY -  BRIEF ANTIBIOTIC NOTE   Pharmacy has received consult(s) for Vancomycin, Cefepime from an ED provider.  The patient's profile has been reviewed for ht/wt/allergies/indication/available labs.    One time order(s) placed for Vancomycin 1250 mg IV X 1 and Cefepime 2 gm IV X 1.   Further antibiotics/pharmacy consults should be ordered by admitting physician if indicated.                       Thank you, Aryona Sill D 01/21/2022  10:36 PM

## 2022-01-21 NOTE — ED Notes (Signed)
MD spoke with pt about allowing staff to start IV, as soon as MD walked out pt refused to allow staff to start an IV on him.

## 2022-01-21 NOTE — Sepsis Progress Note (Addendum)
Elink following for Sepsis Protocol, Please see progress notes regarding pt's refusal for labs and IV's

## 2022-01-21 NOTE — ED Notes (Signed)
IVC for Safety/Treatment

## 2022-01-21 NOTE — Progress Notes (Signed)
CODE SEPSIS - PHARMACY COMMUNICATION  **Broad Spectrum Antibiotics should be administered within 1 hour of Sepsis diagnosis**  Time Code Sepsis Called/Page Received:  9/2 @ 2211  Antibiotics Ordered: Vanc, Cefepime   Time of 1st antibiotic administration:  none given as of 9/2 @ 2230 - pt refusing to allow IV access   Additional action taken by pharmacy:   If necessary, Name of Provider/Nurse Contacted:     Eola Waldrep D ,PharmD Clinical Pharmacist  01/21/2022  11:25 PM

## 2022-01-22 ENCOUNTER — Emergency Department: Payer: Medicaid Other

## 2022-01-22 ENCOUNTER — Encounter: Payer: Self-pay | Admitting: Radiology

## 2022-01-22 ENCOUNTER — Inpatient Hospital Stay: Payer: Medicaid Other

## 2022-01-22 ENCOUNTER — Inpatient Hospital Stay: Payer: Self-pay

## 2022-01-22 ENCOUNTER — Inpatient Hospital Stay (HOSPITAL_COMMUNITY)
Admit: 2022-01-22 | Discharge: 2022-01-22 | Disposition: A | Discharge status: Home / Self Care | Payer: Medicaid Other | Attending: Pulmonary Disease | Admitting: Pulmonary Disease

## 2022-01-22 DIAGNOSIS — I38 Endocarditis, valve unspecified: Secondary | ICD-10-CM | POA: Diagnosis not present

## 2022-01-22 DIAGNOSIS — F1123 Opioid dependence with withdrawal: Secondary | ICD-10-CM | POA: Diagnosis not present

## 2022-01-22 DIAGNOSIS — R6520 Severe sepsis without septic shock: Secondary | ICD-10-CM

## 2022-01-22 DIAGNOSIS — I330 Acute and subacute infective endocarditis: Secondary | ICD-10-CM | POA: Diagnosis present

## 2022-01-22 DIAGNOSIS — I361 Nonrheumatic tricuspid (valve) insufficiency: Secondary | ICD-10-CM | POA: Diagnosis not present

## 2022-01-22 DIAGNOSIS — I110 Hypertensive heart disease with heart failure: Secondary | ICD-10-CM | POA: Diagnosis present

## 2022-01-22 DIAGNOSIS — A4153 Sepsis due to Serratia: Secondary | ICD-10-CM | POA: Diagnosis present

## 2022-01-22 DIAGNOSIS — Z5902 Unsheltered homelessness: Secondary | ICD-10-CM | POA: Diagnosis not present

## 2022-01-22 DIAGNOSIS — R4182 Altered mental status, unspecified: Secondary | ICD-10-CM | POA: Diagnosis present

## 2022-01-22 DIAGNOSIS — Z66 Do not resuscitate: Secondary | ICD-10-CM | POA: Diagnosis present

## 2022-01-22 DIAGNOSIS — Z681 Body mass index (BMI) 19 or less, adult: Secondary | ICD-10-CM | POA: Diagnosis not present

## 2022-01-22 DIAGNOSIS — Z20822 Contact with and (suspected) exposure to covid-19: Secondary | ICD-10-CM | POA: Diagnosis present

## 2022-01-22 DIAGNOSIS — Z515 Encounter for palliative care: Secondary | ICD-10-CM | POA: Diagnosis not present

## 2022-01-22 DIAGNOSIS — M869 Osteomyelitis, unspecified: Secondary | ICD-10-CM | POA: Diagnosis present

## 2022-01-22 DIAGNOSIS — G928 Other toxic encephalopathy: Secondary | ICD-10-CM | POA: Diagnosis present

## 2022-01-22 DIAGNOSIS — R6521 Severe sepsis with septic shock: Secondary | ICD-10-CM | POA: Diagnosis present

## 2022-01-22 DIAGNOSIS — R7881 Bacteremia: Secondary | ICD-10-CM | POA: Diagnosis not present

## 2022-01-22 DIAGNOSIS — Y92230 Patient room in hospital as the place of occurrence of the external cause: Secondary | ICD-10-CM | POA: Diagnosis not present

## 2022-01-22 DIAGNOSIS — E43 Unspecified severe protein-calorie malnutrition: Secondary | ICD-10-CM | POA: Diagnosis present

## 2022-01-22 DIAGNOSIS — A419 Sepsis, unspecified organism: Secondary | ICD-10-CM | POA: Diagnosis present

## 2022-01-22 DIAGNOSIS — I2489 Other forms of acute ischemic heart disease: Secondary | ICD-10-CM | POA: Diagnosis present

## 2022-01-22 DIAGNOSIS — W06XXXA Fall from bed, initial encounter: Secondary | ICD-10-CM | POA: Diagnosis not present

## 2022-01-22 DIAGNOSIS — G060 Intracranial abscess and granuloma: Secondary | ICD-10-CM | POA: Diagnosis present

## 2022-01-22 DIAGNOSIS — D638 Anemia in other chronic diseases classified elsewhere: Secondary | ICD-10-CM | POA: Diagnosis present

## 2022-01-22 DIAGNOSIS — I5021 Acute systolic (congestive) heart failure: Secondary | ICD-10-CM | POA: Diagnosis present

## 2022-01-22 DIAGNOSIS — E1165 Type 2 diabetes mellitus with hyperglycemia: Secondary | ICD-10-CM | POA: Diagnosis present

## 2022-01-22 DIAGNOSIS — M8618 Other acute osteomyelitis, other site: Secondary | ICD-10-CM | POA: Diagnosis not present

## 2022-01-22 DIAGNOSIS — E871 Hypo-osmolality and hyponatremia: Secondary | ICD-10-CM | POA: Diagnosis present

## 2022-01-22 DIAGNOSIS — I669 Occlusion and stenosis of unspecified cerebral artery: Secondary | ICD-10-CM | POA: Diagnosis present

## 2022-01-22 DIAGNOSIS — D649 Anemia, unspecified: Secondary | ICD-10-CM | POA: Diagnosis not present

## 2022-01-22 DIAGNOSIS — I340 Nonrheumatic mitral (valve) insufficiency: Secondary | ICD-10-CM

## 2022-01-22 DIAGNOSIS — Z7189 Other specified counseling: Secondary | ICD-10-CM | POA: Diagnosis not present

## 2022-01-22 DIAGNOSIS — F1920 Other psychoactive substance dependence, uncomplicated: Secondary | ICD-10-CM | POA: Diagnosis not present

## 2022-01-22 DIAGNOSIS — F1410 Cocaine abuse, uncomplicated: Secondary | ICD-10-CM | POA: Diagnosis present

## 2022-01-22 DIAGNOSIS — I358 Other nonrheumatic aortic valve disorders: Secondary | ICD-10-CM | POA: Diagnosis not present

## 2022-01-22 DIAGNOSIS — I76 Septic arterial embolism: Secondary | ICD-10-CM | POA: Diagnosis present

## 2022-01-22 DIAGNOSIS — E872 Acidosis, unspecified: Secondary | ICD-10-CM | POA: Diagnosis present

## 2022-01-22 DIAGNOSIS — D696 Thrombocytopenia, unspecified: Secondary | ICD-10-CM | POA: Diagnosis present

## 2022-01-22 LAB — RESP PANEL BY RT-PCR (FLU A&B, COVID) ARPGX2
Influenza A by PCR: NEGATIVE
Influenza B by PCR: NEGATIVE
SARS Coronavirus 2 by RT PCR: NEGATIVE

## 2022-01-22 LAB — MAGNESIUM
Magnesium: 1.7 mg/dL (ref 1.7–2.4)
Magnesium: 1.9 mg/dL (ref 1.7–2.4)

## 2022-01-22 LAB — BLOOD GAS, ARTERIAL
Acid-Base Excess: 4.4 mmol/L — ABNORMAL HIGH (ref 0.0–2.0)
Acid-Base Excess: 3.5 mmol/L — ABNORMAL HIGH (ref 0.0–2.0)
Bicarbonate: 26.2 mmol/L (ref 20.0–28.0)
Bicarbonate: 25.9 mmol/L (ref 20.0–28.0)
FIO2: 40 %
FIO2: 28 %
MECHVT: 500 mL
MECHVT: 500 mL
Mechanical Rate: 18
O2 Saturation: 99.6 %
O2 Saturation: 99.6 %
PEEP: 5 cmH2O
Patient temperature: 37
Patient temperature: 37
RATE: 14 resp/min
pCO2 arterial: 30 mmHg — ABNORMAL LOW (ref 32–48)
pCO2 arterial: 31 mmHg — ABNORMAL LOW (ref 32–48)
pH, Arterial: 7.55 — ABNORMAL HIGH (ref 7.35–7.45)
pH, Arterial: 7.53 — ABNORMAL HIGH (ref 7.35–7.45)
pO2, Arterial: 157 mmHg — ABNORMAL HIGH (ref 83–108)
pO2, Arterial: 134 mmHg — ABNORMAL HIGH (ref 83–108)

## 2022-01-22 LAB — PROTIME-INR
INR: 1.3 — ABNORMAL HIGH (ref 0.8–1.2)
Prothrombin Time: 16.1 seconds — ABNORMAL HIGH (ref 11.4–15.2)

## 2022-01-22 LAB — COMPREHENSIVE METABOLIC PANEL
ALT: 14 U/L (ref 0–44)
AST: 33 U/L (ref 15–41)
Albumin: 2.8 g/dL — ABNORMAL LOW (ref 3.5–5.0)
Alkaline Phosphatase: 117 U/L (ref 38–126)
Anion gap: 15 (ref 5–15)
BUN: 9 mg/dL (ref 6–20)
CO2: 24 mmol/L (ref 22–32)
Calcium: 8.4 mg/dL — ABNORMAL LOW (ref 8.9–10.3)
Chloride: 90 mmol/L — ABNORMAL LOW (ref 98–111)
Creatinine, Ser: 0.8 mg/dL (ref 0.61–1.24)
GFR, Estimated: >60 mL/min (ref >60)
Glucose, Bld: 304 mg/dL — ABNORMAL HIGH (ref 70–99)
Potassium: 3.5 mmol/L (ref 3.5–5.1)
Sodium: 129 mmol/L — ABNORMAL LOW (ref 135–145)
Total Bilirubin: 1.1 mg/dL (ref 0.3–1.2)
Total Protein: 9 g/dL — ABNORMAL HIGH (ref 6.5–8.1)

## 2022-01-22 LAB — GLUCOSE, CAPILLARY
Glucose-Capillary: 101 mg/dL — ABNORMAL HIGH (ref 70–99)
Glucose-Capillary: 122 mg/dL — ABNORMAL HIGH (ref 70–99)
Glucose-Capillary: 131 mg/dL — ABNORMAL HIGH (ref 70–99)
Glucose-Capillary: 251 mg/dL — ABNORMAL HIGH (ref 70–99)
Glucose-Capillary: 277 mg/dL — ABNORMAL HIGH (ref 70–99)
Glucose-Capillary: 91 mg/dL (ref 70–99)

## 2022-01-22 LAB — CBC WITH DIFFERENTIAL/PLATELET
Abs Immature Granulocytes: 0.09 10*3/uL — ABNORMAL HIGH (ref 0.00–0.07)
Basophils Absolute: 0 10*3/uL (ref 0.0–0.1)
Basophils Relative: 0 %
Eosinophils Absolute: 0 10*3/uL (ref 0.0–0.5)
Eosinophils Relative: 0 %
HCT: 38.2 % — ABNORMAL LOW (ref 39.0–52.0)
Hemoglobin: 12.4 g/dL — ABNORMAL LOW (ref 13.0–17.0)
Immature Granulocytes: 1 %
Lymphocytes Relative: 5 %
Lymphs Abs: 0.7 10*3/uL (ref 0.7–4.0)
MCH: 28.3 pg (ref 26.0–34.0)
MCHC: 32.5 g/dL (ref 30.0–36.0)
MCV: 87.2 fL (ref 80.0–100.0)
Monocytes Absolute: 0.2 10*3/uL (ref 0.1–1.0)
Monocytes Relative: 2 %
Neutro Abs: 12.9 10*3/uL — ABNORMAL HIGH (ref 1.7–7.7)
Neutrophils Relative %: 92 %
Platelets: 250 10*3/uL (ref 150–400)
RBC: 4.38 MIL/uL (ref 4.22–5.81)
RDW: 13.3 % (ref 11.5–15.5)
WBC: 14 10*3/uL — ABNORMAL HIGH (ref 4.0–10.5)
nRBC: 0 % (ref 0.0–0.2)

## 2022-01-22 LAB — URINE DRUG SCREEN, QUALITATIVE (ARMC ONLY)
Amphetamines, Ur Screen: NOT DETECTED
Barbiturates, Ur Screen: NOT DETECTED
Benzodiazepine, Ur Scrn: NOT DETECTED
Cannabinoid 50 Ng, Ur ~~LOC~~: NOT DETECTED
Cocaine Metabolite,Ur ~~LOC~~: POSITIVE — AB
MDMA (Ecstasy)Ur Screen: NOT DETECTED
Methadone Scn, Ur: NOT DETECTED
Opiate, Ur Screen: POSITIVE — AB
Phencyclidine (PCP) Ur S: NOT DETECTED
Tricyclic, Ur Screen: NOT DETECTED

## 2022-01-22 LAB — URINALYSIS, COMPLETE (UACMP) WITH MICROSCOPIC
Bilirubin Urine: NEGATIVE
Glucose, UA: 50 mg/dL — AB
Ketones, ur: NEGATIVE mg/dL
Leukocytes,Ua: NEGATIVE
Nitrite: NEGATIVE
Protein, ur: 100 mg/dL — AB
Specific Gravity, Urine: 1.006 (ref 1.005–1.030)
Squamous Epithelial / HPF: NONE SEEN (ref 0–5)
pH: 6 (ref 5.0–8.0)

## 2022-01-22 LAB — LACTIC ACID, PLASMA
Lactic Acid, Venous: 5.1 mmol/L (ref 0.5–1.9)
Lactic Acid, Venous: 4.9 mmol/L (ref 0.5–1.9)
Lactic Acid, Venous: 1.8 mmol/L (ref 0.5–1.9)

## 2022-01-22 LAB — AMMONIA: Ammonia: 16 umol/L (ref 9–35)

## 2022-01-22 LAB — CK: Total CK: 207 U/L (ref 49–397)

## 2022-01-22 LAB — ECHOCARDIOGRAM COMPLETE
AV Peak grad: 6.9 mmHg
Ao pk vel: 1.31 m/s
Area-P 1/2: 6.77 cm2
S' Lateral: 2.89 cm
Single Plane A4C EF: 64.8 %

## 2022-01-22 LAB — POTASSIUM: Potassium: 2.3 mmol/L — CL (ref 3.5–5.1)

## 2022-01-22 LAB — APTT: aPTT: 33 seconds (ref 24–36)

## 2022-01-22 LAB — MRSA NEXT GEN BY PCR, NASAL: MRSA by PCR Next Gen: NOT DETECTED

## 2022-01-22 LAB — PHOSPHORUS
Phosphorus: 2.1 mg/dL — ABNORMAL LOW (ref 2.5–4.6)
Phosphorus: 3.2 mg/dL (ref 2.5–4.6)

## 2022-01-22 LAB — SEDIMENTATION RATE: Sed Rate: 63 mm/hr — ABNORMAL HIGH (ref 0–20)

## 2022-01-22 LAB — SODIUM: Sodium: 135 mmol/L (ref 135–145)

## 2022-01-22 LAB — ETHANOL: Alcohol, Ethyl (B): <10 mg/dL (ref <10)

## 2022-01-22 LAB — CBG MONITORING, ED: Glucose-Capillary: 284 mg/dL — ABNORMAL HIGH (ref 70–99)

## 2022-01-22 LAB — TROPONIN I (HIGH SENSITIVITY): Troponin I (High Sensitivity): 89 ng/L — ABNORMAL HIGH (ref <18)

## 2022-01-22 LAB — PROCALCITONIN: Procalcitonin: 2.98 ng/mL

## 2022-01-22 LAB — C-REACTIVE PROTEIN: CRP: 15 mg/dL — ABNORMAL HIGH (ref <1.0)

## 2022-01-22 MED ORDER — MIDAZOLAM HCL 2 MG/2ML IJ SOLN: 2.0000 mg | INTRAMUSCULAR | Status: DC | PRN

## 2022-01-22 MED ORDER — CHLORHEXIDINE GLUCONATE CLOTH 2 % EX PADS
6.0000 | MEDICATED_PAD | Freq: Every day | CUTANEOUS | Status: DC
  Administered 2022-01-23 – 2022-02-17 (×20): 6 via TOPICAL

## 2022-01-22 MED ORDER — VANCOMYCIN HCL 1250 MG/250ML IV SOLN
1250.0000 mg | INTRAVENOUS | Status: DC
  Administered 2022-01-22: 1250 mg via INTRAVENOUS
  Filled 2022-01-22: qty 250

## 2022-01-22 MED ORDER — DEXMEDETOMIDINE HCL IN NACL 400 MCG/100ML IV SOLN
0.4000 ug/kg/h | INTRAVENOUS | Status: DC
  Administered 2022-01-22: 1 ug/kg/h via INTRAVENOUS
  Administered 2022-01-22: 0.4 ug/kg/h via INTRAVENOUS

## 2022-01-22 MED ORDER — PANTOPRAZOLE 2 MG/ML SUSPENSION
40.0000 mg | Freq: Every day | ORAL | Status: DC
  Administered 2022-01-22 – 2022-01-25 (×4): 40 mg
  Filled 2022-01-22 (×4): qty 20

## 2022-01-22 MED ORDER — SODIUM CHLORIDE 0.9 % IV SOLN
1.0000 g | Freq: Three times a day (TID) | INTRAVENOUS | Status: AC
  Administered 2022-01-22 – 2022-01-24 (×8): 1 g via INTRAVENOUS
  Filled 2022-01-22 (×2): qty 20
  Filled 2022-01-22 (×5): qty 1
  Filled 2022-01-22: qty 20

## 2022-01-22 MED ORDER — ENOXAPARIN SODIUM 40 MG/0.4ML IJ SOSY
40.0000 mg | PREFILLED_SYRINGE | INTRAMUSCULAR | Status: DC
  Administered 2022-01-22 – 2022-01-24 (×3): 40 mg via SUBCUTANEOUS
  Filled 2022-01-22 (×3): qty 0.4

## 2022-01-22 MED ORDER — ROCURONIUM BROMIDE 10 MG/ML (PF) SYRINGE
60.0000 mg | PREFILLED_SYRINGE | Freq: Once | INTRAVENOUS | Status: AC
  Administered 2022-01-22: 60 mg via INTRAVENOUS
  Filled 2022-01-22: qty 10

## 2022-01-22 MED ORDER — SODIUM CHLORIDE 0.9 % IV SOLN
2.0000 g | Freq: Three times a day (TID) | INTRAVENOUS | Status: DC
  Filled 2022-01-22 (×2): qty 12.5

## 2022-01-22 MED ORDER — ALBUMIN HUMAN 25 % IV SOLN
25.0000 g | Freq: Two times a day (BID) | INTRAVENOUS | Status: AC
  Administered 2022-01-22 – 2022-01-23 (×4): 25 g via INTRAVENOUS
  Filled 2022-01-22 (×4): qty 100

## 2022-01-22 MED ORDER — FENTANYL 2500MCG IN NS 250ML (10MCG/ML) PREMIX INFUSION
0.0000 ug/h | INTRAVENOUS | Status: DC
  Administered 2022-01-22 (×3): 25 ug/h via INTRAVENOUS
  Administered 2022-01-24: 100 ug/h via INTRAVENOUS
  Administered 2022-01-25: 125 ug/h via INTRAVENOUS
  Filled 2022-01-22 (×3): qty 250

## 2022-01-22 MED ORDER — HALOPERIDOL LACTATE 5 MG/ML IJ SOLN
5.0000 mg | Freq: Once | INTRAMUSCULAR | Status: AC
  Administered 2022-01-22: 5 mg via INTRAMUSCULAR
  Filled 2022-01-22: qty 1

## 2022-01-22 MED ORDER — SODIUM PHOSPHATES 45 MMOLE/15ML IV SOLN
15.0000 mmol | Freq: Once | INTRAVENOUS | Status: AC
  Administered 2022-01-22: 15 mmol via INTRAVENOUS
  Filled 2022-01-22: qty 5

## 2022-01-22 MED ORDER — LORAZEPAM 2 MG/ML IJ SOLN: 2.0000 mg | Freq: Once | INTRAMUSCULAR | Status: DC

## 2022-01-22 MED ORDER — INSULIN ASPART 100 UNIT/ML IJ SOLN
0.0000 [IU] | INTRAMUSCULAR | Status: DC
  Administered 2022-01-22: 3 [IU] via SUBCUTANEOUS
  Administered 2022-01-22: 11 [IU] via SUBCUTANEOUS
  Administered 2022-01-22: 3 [IU] via SUBCUTANEOUS
  Filled 2022-01-22 (×2): qty 1

## 2022-01-22 MED ORDER — POLYETHYLENE GLYCOL 3350 17 G PO PACK
17.0000 g | PACK | Freq: Every day | ORAL | Status: DC
  Administered 2022-01-23 – 2022-01-25 (×3): 17 g
  Filled 2022-01-22 (×4): qty 1

## 2022-01-22 MED ORDER — GADOBUTROL 1 MMOL/ML IV SOLN
5.0000 mL | Freq: Once | INTRAVENOUS | Status: AC | PRN
  Administered 2022-01-22: 7.5 mL via INTRAVENOUS

## 2022-01-22 MED ORDER — LORAZEPAM 2 MG/ML IJ SOLN
2.0000 mg | Freq: Once | INTRAMUSCULAR | Status: AC
  Administered 2022-01-22: 2 mg via INTRAMUSCULAR
  Filled 2022-01-22: qty 1

## 2022-01-22 MED ORDER — VANCOMYCIN HCL 1250 MG/250ML IV SOLN
1250.0000 mg | INTRAVENOUS | Status: DC
  Administered 2022-01-23: 1250 mg via INTRAVENOUS
  Filled 2022-01-22: qty 250

## 2022-01-22 MED ORDER — POTASSIUM CHLORIDE 20 MEQ PO PACK
40.0000 meq | PACK | Freq: Once | ORAL | Status: AC
  Administered 2022-01-22: 40 meq
  Filled 2022-01-22: qty 2

## 2022-01-22 MED ORDER — DOCUSATE SODIUM 50 MG/5ML PO LIQD
100.0000 mg | Freq: Two times a day (BID) | ORAL | Status: DC
  Administered 2022-01-22 – 2022-01-25 (×7): 100 mg
  Filled 2022-01-22 (×7): qty 10

## 2022-01-22 MED ORDER — SODIUM CHLORIDE 0.9 % IV SOLN: INTRAVENOUS | Status: DC | PRN

## 2022-01-22 MED ORDER — LACTATED RINGERS IV BOLUS
500.0000 mL | Freq: Once | INTRAVENOUS | Status: AC
  Administered 2022-01-22: 500 mL via INTRAVENOUS

## 2022-01-22 MED ORDER — FENTANYL BOLUS VIA INFUSION
100.0000 ug | Freq: Once | INTRAVENOUS | Status: AC
  Administered 2022-01-22: 100 ug via INTRAVENOUS

## 2022-01-22 MED ORDER — LACTATED RINGERS IV BOLUS
1000.0000 mL | Freq: Once | INTRAVENOUS | Status: AC
Start: 2022-01-22 — End: 2022-01-22
  Administered 2022-01-22: 1000 mL via INTRAVENOUS

## 2022-01-22 MED ORDER — PROPOFOL 1000 MG/100ML IV EMUL
5.0000 ug/kg/min | INTRAVENOUS | Status: DC
  Administered 2022-01-22 (×2): 20 ug/kg/min via INTRAVENOUS
  Administered 2022-01-22: 40 ug/kg/min via INTRAVENOUS
  Administered 2022-01-22: 10 ug/kg/min via INTRAVENOUS
  Administered 2022-01-23: 25 ug/kg/min via INTRAVENOUS
  Administered 2022-01-24 (×2): 50 ug/kg/min via INTRAVENOUS
  Administered 2022-01-24: 35 ug/kg/min via INTRAVENOUS
  Administered 2022-01-24: 45 ug/kg/min via INTRAVENOUS
  Administered 2022-01-25: 50 ug/kg/min via INTRAVENOUS
  Administered 2022-01-25: 40 ug/kg/min via INTRAVENOUS
  Filled 2022-01-22 (×11): qty 100

## 2022-01-22 MED ORDER — ETOMIDATE 2 MG/ML IV SOLN
20.0000 mg | Freq: Once | INTRAVENOUS | Status: AC
  Administered 2022-01-22: 20 mg via INTRAVENOUS
  Filled 2022-01-22: qty 10

## 2022-01-22 MED ORDER — POTASSIUM CHLORIDE 10 MEQ/50ML IV SOLN
10.0000 meq | INTRAVENOUS | Status: AC
  Administered 2022-01-22 – 2022-01-23 (×4): 10 meq via INTRAVENOUS
  Filled 2022-01-22 (×4): qty 50

## 2022-01-22 MED ORDER — MIDAZOLAM HCL 2 MG/2ML IJ SOLN
2.0000 mg | INTRAMUSCULAR | Status: DC | PRN
  Filled 2022-01-22 (×4): qty 2

## 2022-01-22 MED ORDER — POLYETHYLENE GLYCOL 3350 17 G PO PACK: 17.0000 g | PACK | Freq: Every day | ORAL | Status: DC | PRN

## 2022-01-22 MED ORDER — INSULIN GLARGINE-YFGN 100 UNIT/ML ~~LOC~~ SOLN
10.0000 [IU] | Freq: Two times a day (BID) | SUBCUTANEOUS | Status: DC
  Administered 2022-01-22: 10 [IU] via SUBCUTANEOUS
  Filled 2022-01-22 (×4): qty 0.1

## 2022-01-22 MED ORDER — LORAZEPAM 2 MG/ML IJ SOLN
1.0000 mg | Freq: Once | INTRAMUSCULAR | Status: AC
  Administered 2022-01-22: 1 mg via INTRAVENOUS
  Filled 2022-01-22: qty 1

## 2022-01-22 MED ORDER — LACTATED RINGERS IV SOLN: INTRAVENOUS | Status: DC

## 2022-01-22 MED ORDER — DOCUSATE SODIUM 100 MG PO CAPS
100.0000 mg | ORAL_CAPSULE | Freq: Two times a day (BID) | ORAL | Status: DC | PRN
  Administered 2022-02-06: 100 mg via ORAL
  Filled 2022-01-22: qty 1

## 2022-01-22 MED ORDER — IOHEXOL 300 MG/ML  SOLN
100.0000 mL | Freq: Once | INTRAMUSCULAR | Status: AC | PRN
Start: 2022-01-22 — End: 2022-01-22
  Administered 2022-01-22: 100 mL via INTRAVENOUS

## 2022-01-22 MED ORDER — ORAL CARE MOUTH RINSE: 15.0000 mL | OROMUCOSAL | Status: DC | PRN

## 2022-01-22 MED ORDER — ACETAMINOPHEN 325 MG PO TABS
650.0000 mg | ORAL_TABLET | ORAL | Status: DC | PRN
  Administered 2022-01-22: 650 mg
  Filled 2022-01-22: qty 2

## 2022-01-22 MED ORDER — ORAL CARE MOUTH RINSE
15.0000 mL | OROMUCOSAL | Status: DC
  Administered 2022-01-22 – 2022-01-25 (×32): 15 mL via OROMUCOSAL

## 2022-01-22 MED ORDER — PHENYLEPHRINE HCL-NACL 20-0.9 MG/250ML-% IV SOLN
0.0000 ug/min | INTRAVENOUS | Status: DC
  Administered 2022-01-22: 75 ug/min via INTRAVENOUS
  Administered 2022-01-22: 20 ug/min via INTRAVENOUS
  Administered 2022-01-22: 100 ug/min via INTRAVENOUS
  Administered 2022-01-23: 25 ug/min via INTRAVENOUS
  Administered 2022-01-23: 75 ug/min via INTRAVENOUS
  Filled 2022-01-22 (×5): qty 250

## 2022-01-22 MED ORDER — MAGNESIUM SULFATE 2 GM/50ML IV SOLN
2.0000 g | Freq: Once | INTRAVENOUS | Status: AC
  Administered 2022-01-22: 2 g via INTRAVENOUS
  Filled 2022-01-22: qty 50

## 2022-01-22 MED ORDER — NOREPINEPHRINE 4 MG/250ML-% IV SOLN
INTRAVENOUS | Status: AC
  Filled 2022-01-22: qty 250
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
## 2022-01-22 NOTE — Plan of Care (Signed)
Id brief note   Asked by pulm/ccu team at [place] to place id recs. Dr Delaine Lame to formally see when she is back next week. This is chart review and recs based on that   56 yo M substance abuse, amditted to [facility] on 9/02 with ams found to have sepsis and imaging suggestion of pelvis lytic lesion  Patient has multiple recent admissions in which he left ama (most recently 8/27 with gn bacteremia and right hip suspicion for septic arthritis and clinical suggestion of pyelo)   W/u this admission fever Lactic acidosis on presentation [facility]bc 14 Uds cocaine/opiates  Ams - intubated CT chest/abdomen/pelvis w contrast 01/22/22: no acute chest process. Abdomen and pelvis with nonspecific small to moderate volume of simple appearing free fluid in the pelvis; hypoenhancing areas in the superior spleen, with underlying borderline splenomegaly, focal bone erosion of the posteroinferior right pubic ramus since last July    He was started on vanc/cefepime    Micro: 9/3 bcx in process; 9/3 ucx in process  8/27 bcx negative; 8/27 ucx negative  8/24 bcx from [facility] serratia marcescens and klebsiella aerogines (both amp-c organisms)   11/2021 hiv screen negative  01/2020 hep c pcr 2.2 million      A/p Ivdu Sepsis Amp-c organisms bacteremia Lytic bone lesion ?septic right hip Confusion with ct suggestion cortical infarct    Agree with w/u endocarditis given ivdu and duration of no treatment rather long and known endocarditis with gram negative rod in ivdu patients  Would also consider pelvis bone biopsy with multiple myeloma workup given age and agree rather unusual location for septic emboli  Patient has no history of mrsa infection, so if current admission bcx without mrsa evidence, would stop vanc  With what appears to be high evidence of amp-c organisms (bacteremia/bone-joint), I would consider carbapenem rather than cefepime for ongoing coverage    -continue vanc; pending  blood cx final report (if negative comes 9/5 can stop) -stop cefepime, start meropenem -agree with endocarditis w/u -also consider other malignant metastatic w/u and MM w/u for pelvic lytic lesion -dr Delaine Lame to formally see when she is back on 9/05 -on call ID team available for question this labor day weekend -discussed with primary team

## 2022-01-22 NOTE — ED Notes (Signed)
  Patient resisting all cares and combative with staff.  Tried to explain to the patient that he was very sick and needed [place] to help him.  Patient attempted to scratch [place] and swinging his arms violently.  Dr Elesa Massed notified.

## 2022-01-22 NOTE — Inpatient Diabetes Management (Signed)
Inpatient Diabetes Program Recommendations  AACE/ADA: New Consensus Statement on Inpatient Glycemic Control (2015)  Target Ranges:  Prepandial:   less than 140 mg/dL      Peak postprandial:   less than 180 mg/dL (1-2 hours)      Critically ill patients:  140 - 180 mg/dL   Lab Results  Component Value Date   GLUCAP 122 (H) 01/22/2022   HGBA1C 13.2 (H) 11/29/2021    Review of Glycemic Control  Diabetes history: DM2 Outpatient Diabetes medications: Humalog 70/30 18 BID Current orders for Inpatient glycemic control: Semglee 10 units BID, Novolog 0-20 Q4H  HgbA1C - 13.2% Sedated and on vent  Inpatient Diabetes Program Recommendations:    Consider decreasing Novolog to 0-15 Q4H.  Has not received Semglee dose today.  Diabetes Coordinator spoke with pt on 7/12 regarding his HgbA1C of 13.2%. Long discussion regarding diabetes survival skills.   Will follow glucose trends.  Review glycemic control trends.  Thank you. Ailene Ards, RD, LDN, CDCES Inpatient Diabetes Coordinator [phone]

## 2022-01-22 NOTE — ED Notes (Signed)
Soft restraints applied by RN

## 2022-01-22 NOTE — ED Notes (Addendum)
Pt has Non-violent soft restraints around his wrist. Pt is swarming around in the bed

## 2022-01-22 NOTE — ED Notes (Signed)
This RN attempted to start IV but patient still restless and fighting staff. Dr. Elesa Massed informed.

## 2022-01-22 NOTE — Procedures (Signed)
Central Venous Catheter Insertion Procedure Note  Earl Moreno  [id number]  10-15-1965  Date:01/22/22  Time:12:29 PM   Provider Performing:Layci Stenglein Earnest Conroy   Procedure: Insertion of Non-tunneled Central Venous Catheter(36556) with US guidance (21194)   Indication(s) Medication administration and Difficult access  Consent Unable to obtain consent due to emergent nature of procedure.  Anesthesia Topical only with 1% lidocaine   Timeout Verified patient identification, verified procedure, site/side was marked, verified correct patient position, special equipment/implants available, medications/allergies/relevant history reviewed, required imaging and test results available.  Sterile Technique Maximal sterile technique including full sterile barrier drape, hand hygiene, sterile gown, sterile gloves, mask, hair covering, sterile ultrasound probe cover (if used).  Procedure Description Area of catheter insertion was cleaned with chlorhexidine and draped in sterile fashion.  With real-time ultrasound guidance a central venous catheter was placed into the left internal jugular vein. Nonpulsatile blood flow and easy flushing noted in all ports.  The catheter was sutured in place and sterile dressing applied.  Complications/Tolerance None; patient tolerated the procedure well. Chest X-ray is ordered to verify placement for internal jugular or subclavian cannulation.   Chest x-ray is not ordered for femoral cannulation.  EBL Minimal  Specimen(s) None  Zada Girt, AGNP  Pulmonary/Critical Care Pager [phone] (please enter 7 digits) PCCM Consult Pager [phone] (please enter 7 digits)

## 2022-01-22 NOTE — ED Notes (Signed)
  Attempted to start PIV on patient to obtain labs and give meds/fluids.  Patient refused and stated that he wanted to leave.  Patient still combative and attempting to hurt staff.

## 2022-01-22 NOTE — H&P (Signed)
NAME:  Earl Moreno, MRN:  [id number], DOB:  06-Oct-1965, LOS: 0 ADMISSION DATE:  01/21/2022, CONSULTATION DATE:  01/22/22 REFERRING MD:  Dr. Leonides Schanz, CHIEF COMPLAINT:  Altered Mental Status  History of Present Illness:  56 yo M presenting to [facility] ED on 01/21/22 via EMS complaining that he does not feel well. History provided by ED documentation as there is no family available and patient intubated and sedated. EMS reported that they think he has a son in [place] who called 911.  Per chart review patient with multiple admissions for sepsis including pyelonephritis, GN bacteremia & suspected osteomyelitis in his right hip as recently as 01/15/22. There have been multiple attempts to treat with IV antibiotics & perform a TEE and rule out endocarditis but the patient continues to leave AMA.  ED course: Upon arrival patient combative asking for help but then telling staff to leave I'm alone. Patient febrile & tachycardic. Sepsis protocol initiated but hindered by the fact that the patient was so uncooperative. Patient received Ativan and Haldol in order to restrain him enough to place a PIV. Lab work significant for lactic acidosis, leukocytosis with a left shift & an elevated PCT. Mild hyponatremia, hyperglycemia & an elevated troponin. UDS positive for cocaine & opiate. CT head concerning for possible subacute cortical infarct. Precedex drip was started, but even at max the patient still thrashing in the bed. Medications given: acetaminophen, cefepime & vancomycin, Geodon, ativan, haldol, etomidate & rocuronium, 2 L of LR Initial Vitals: 100.5, 20, 111, 116/68 & spO2 98 % on RA Significant labs: (Labs/ Imaging personally reviewed) I, Domingo Pulse Rust-Chester, AGACNP-BC, personally viewed and interpreted this ECG. EKG Interpretation: Date: 01/22/22, EKG Time: 02:30, Rate: 120, Rhythm: ST, QRS Axis:  LAD, Intervals: normal, ST/T Wave abnormalities: none, Narrative Interpretation: ST Chemistry: Na+: 129 (when  corrected for glucose- 134, K+: 3.5, BUN/Cr.: 9/0.80, Serum CO2/ AG: 24/ 15 Hematology: WBC: 14.0, Hgb: 12.4,  Troponin: 89, CK: 207, Lactic/ PCT: 5.1/ 2.98, COVID-19 & Influenza A/B: negative UDS: +cocaine & +opiates ABG: 7.55/ 30/ 157/ 26.2  CXR 01/22/22: no acute cardiopulmonary abnormalities CT head wo contrast 01/22/22: Focal loss of gray-white matter differentiation within the right parietal cortex which may reflect a subacute cortical infarct, though this is not well characterized on this examination. No associated mass effect or midline shift. Contrast enhanced MRI examination is recommended for further evaluation CT chest/abdomen/pelvis w contrast 01/22/22: No acute or inflammatory process identified in the chest. Satisfactory endotracheal and enteric tubes. Abdomen and pelvis remarkable for:- abnormal but nonspecific small to moderate volume of simple appearing free fluid in the pelvis. Paucity of intra-abdominal fat limits bowel detail, but no bowel obstruction or inflammation is evident.- small hypoenhancing areas in the superior spleen, with underlying borderline splenomegaly. Although nonspecific consider small splenic infarcts or hematogenous infection in this setting.- focal bone erosion of the posteroinferior right pubic ramus since last July. But no other lytic osseous lesion identified, and no regional inflammation. This would be an unusual site for osteomyelitis. Query a metabolic (such as parathyroid) bone disorder. Metastatic disease also not excluded.  PCCM consulted for admission due to mechanical intubation and support in the setting of severe sepsis.  Pertinent  Medical History  Heroin abuse T2DM Splenic infarct GN sepsis hyponatremia Significant Hospital Events: Including procedures, antibiotic start and stop dates in addition to other pertinent events   01/22/22: admit to ICU after mechanical intubation due to AMS and sedative administration as well as severe sepsis s/t  suspected  osteomyelitis  Interim History / Subjective:  Patient intubated and sedated.  Objective   Blood pressure (!) 164/76, pulse (!) 124, temperature (!) 100.5 F (38.1 C), temperature source Axillary, resp. rate (!) 34, SpO2 100 %.       No intake or output data in the 24 hours ending 01/22/22 0437 There were no vitals filed for this visit.  Examination: General: Adult male, critically ill, lying in bed intubated & sedated requiring mechanical ventilation, NAD HEENT: MM pink/moist, anicteric, atraumatic, neck supple Neuro: RASS: -4, unable to follow commands, PERRL +3, MAE CV: s1s2 RRR, NSR on monitor, no r/m/g Pulm: Regular, non labored on PRVC 40% & PEEP 5, breath sounds clear-BUL & diminished-BLL GI: soft, flat, bs x 4 GU: foley in place with clear yellow urine Skin:  no rashes/lesions noted Extremities: warm/dry, pulses + 2 R/P, no edema noted  Resolved Hospital Problem list     Assessment & Plan:  Suspected Severe Sepsis without septic shock due to suspected osteomyelitis in the RIGHT hip Recent MRI 01/15/22: Bone marrow edema and enhancement of the right ischial tuberosity at the site of the right hamstring tendon origin. Although this could be partially secondary to underlying hamstring tendinopathy, appearance is most suspicious for acute osteomyelitis. No overlying soft tissue wound, ulceration, or abscess. No evidence of osseous metastatic disease of the pelvis. Initial interventions/workup included: 2 L of NS/LR & Cefepime/ Vancomycin - Supplemental oxygen as needed, to maintain SpO2 > 90% - f/u cultures, trend lactic/ PCT > STAT lactic  - Daily CBC, monitor WBC/ fever curve - IV antibiotics: cefepime & vancomycin  - MRI pelvis ordered - IVF hydration as needed post bolus - Consider vasopressors to maintain MAP< 65 - Strict I/O's: alert provider if UOP < 0.5 mL/kg/hr - will need TEE - consult to Infectious Disease, appreciate input  Mechanical Intubation for  airway protection secondary to sedating medications - Ventilator settings: PRVC  8 mL/kg, 40% FiO2, 5 PEEP, continue ventilator support & lung protective strategies - Wean PEEP & FiO2 as tolerated, maintain SpO2 > 90% - Head of bed elevated 30 degrees, VAP protocol in place - Plateau pressures less than 30 cm H20  - Intermittent chest x-ray & ABG PRN - Daily WUA with SBT as tolerated  - Ensure adequate pulmonary hygiene  - PAD protocol in place: continue Fentanyl drip & Propofol drip  Uncontrolled Type 2 Diabetes Mellitus Hemoglobin A1C: 13.5 (11/2021) - Monitor CBG Q 4 hours - SSI resistant dosing, will start glargine insulin 10 units BID - target range while in ICU: 140-180 - follow ICU hyper/hypo-glycemia protocol - consult Diabetes Coordinator  Acute Encephalopathy suspect multifocal in the setting of cocaine & opiate substance abuse and severe sepsis Query septic emboli infarct? - neuro checks Q 4 - supportive care - STAT MRI brain pending  Elevated Troponin secondary to N-STEMI vs demand ischemia - Trend troponins  - consider systemic anticoagulation depending on trend  - continuous cardiac monitoring - will need TEE to rule out endocarditis  Best Practice (right click and "Reselect all SmartList Selections" daily)  Diet/type: NPO w/ meds via tube DVT prophylaxis: LMWH GI prophylaxis: PPI Lines: N/A Foley:  Yes, and it is still needed Code Status:  full code Last date of multidisciplinary goals of care discussion [N/A- no family available, patient altered on arrival]  Labs   CBC: Recent Labs  Lab 01/15/22 0547 01/22/22 0255  WBC 15.6* 14.0*  NEUTROABS  --  12.9*  HGB 10.5* 12.4*  HCT 30.5* 38.2*  MCV 85.0 87.2  PLT 235 409    Basic Metabolic Panel: Recent Labs  Lab 01/15/22 0547 01/22/22 0255  NA 132* 129*  K 3.1* 3.5  CL 101 90*  CO2 24 24  GLUCOSE 235* 304*  BUN 12 9  CREATININE 0.78 0.80  CALCIUM 7.8* 8.4*   GFR: Estimated Creatinine  Clearance: 75.2 mL/min (by C-G formula based on SCr of 0.8 mg/dL). Recent Labs  Lab 01/15/22 0547 01/15/22 0832 01/22/22 0255  PROCALCITON  --   --  2.98  WBC 15.6*  --  14.0*  LATICACIDVEN 2.2* 1.7 5.1*    Liver Function Tests: Recent Labs  Lab 01/22/22 0255  AST 33  ALT 14  ALKPHOS 117  BILITOT 1.1  PROT 9.0*  ALBUMIN 2.8*   No results for input(s): "LIPASE", "AMYLASE" in the last 168 hours. Recent Labs  Lab 01/22/22 0255  AMMONIA 16    ABG    Component Value Date/Time   HCO3 32.0 (H) 10/19/2020 0857   O2SAT 87.1 10/19/2020 0857     Coagulation Profile: Recent Labs  Lab 01/22/22 0255  INR 1.3*    Cardiac Enzymes: Recent Labs  Lab 01/22/22 0255  CKTOTAL 207    HbA1C: Hgb A1c MFr Bld  Date/Time Value Ref Range Status  11/29/2021 02:02 PM 13.2 (H) 4.8 - 5.6 % Final    Comment:    (NOTE) Pre diabetes:          5.7%-6.4%  Diabetes:              >6.4%  Glycemic control for   <7.0% adults with diabetes   10/19/2020 04:44 PM 14.4 (H) 4.8 - 5.6 % Final    Comment:    (NOTE)         Prediabetes: 5.7 - 6.4         Diabetes: >6.4         Glycemic control for adults with diabetes: <7.0     CBG: Recent Labs  Lab 01/15/22 0737 01/15/22 1138 01/22/22 0036  GLUCAP 223* 366* 284*    Review of Systems:   UTA- patient intubated and sedated  Past Medical History:  He,  has a past medical history of Diabetes mellitus without complication ([place]).   Surgical History:   Past Surgical History:  Procedure Laterality Date   I & D EXTREMITY Left 06/05/2018   Procedure: IRRIGATION AND DEBRIDEMENT EXTREMITY-LEFT HAND;  Surgeon: Lovell Sheehan, MD;  Location: [facility] ORS;  Service: Orthopedics;  Laterality: Left;     Social History:   reports that he has been smoking. He has been smoking an average of .5 packs per day. He has never used smokeless tobacco. He reports current drug use. Drugs: Cocaine and IV. He reports that he does not drink alcohol.    Family History:  His family history includes Diabetes in his mother.   Allergies No Known Allergies   Home Medications  Prior to Admission medications   Medication Sig Start Date End Date Taking? Authorizing Provider  amLODipine (NORVASC) 10 MG tablet Take 1 tablet (10 mg total) by mouth daily. 12/02/21   Barb Merino, MD  Baclofen 5 MG TABS Take 5 mg by mouth 3 (three) times daily as needed for muscle spasms. 12/02/21   Barb Merino, MD  Blood Glucose Monitoring Suppl (BLOOD GLUCOSE MONITOR SYSTEM) w/Device KIT USE UP TO 4 TIMES DAILY AS DIRECTED. 02/26/21   [name], MD  Blood Glucose Monitoring Suppl (RIGHTEST  UW389 BLOOD GLUCOSE) w/Device KIT Use as directed to test blood sugar up to 4 times daily 12/02/21   Barb Merino, MD  glucose blood (RIGHTEST [phone] BLOOD GLUCOSE) test strip Use as directed to check blood sugar up to 4 times daily 12/02/21   Barb Merino, MD  Insulin Lispro Prot & Lispro (HUMALOG MIX 75/25 KWIKPEN) (75-25) 100 UNIT/ML Kwikpen Inject 18 Units into the skin 2 (two) times daily. 12/02/21   Barb Merino, MD  Insulin Pen Needle (COMFORT EZ PEN NEEDLES) 32G X 4 MM MISC USE AS DIRECTED TWICE DAILY (MORNING AND BEDTIME) 12/02/21   Barb Merino, MD  levofloxacin (LEVAQUIN) 750 MG tablet Take 750 mg by mouth daily. 01/12/22 01/26/22  [provider]  Rightest GL300 Lancets MISC USE AS DIRECTED UP TO 4 TIMES DAILY. 12/02/21   Barb Merino, MD     Critical care time: 62 minutes     Venetia Night, AGACNP-BC Acute Care Nurse Practitioner [facility]   [phone] / [phone] Please see Amion for pager details.

## 2022-01-22 NOTE — Progress Notes (Signed)
Pharmacy Antibiotic Note  Earl Moreno is a 56 y.o. male admitted on 01/21/2022 with  osteomyelitis .  Pharmacy has been consulted for Vancomycin, Cefepime  >> transitioned to [facility] Per ID rec.  Plan: Cefepime 2 gm IV Q8H last dose 9/03 0600 >> merrem 1g IV q8h started 9/03 1415  Vancomycin 1250 mg IV Q24H discontinued per ID recommendation and d/w CCM. CCM reordered Vancomycin consult for sepsis. Vancomycin 1250mg  IV q24h reentered and timed appropriately.     Temp (24hrs), Avg:101.5 F (38.6 C), Min:99.3 F (37.4 C), Max:103.8 F (39.9 C)  Recent Labs  Lab 01/22/22 0255 01/22/22 0855  WBC 14.0*  --   CREATININE 0.80  --   LATICACIDVEN 5.1* 4.9*     Estimated Creatinine Clearance: 75.2 mL/min (by C-G formula based on SCr of 0.8 mg/dL).    No Known Allergies  Antimicrobials this admission:   VAN/CFP/MTZ x1 (9/2)    CFP (9/2>>9/3)   Merrem (9/3>>  Dose adjustments this admission: None at present. CTM and adjust PRN.  Microbiology results:  BCx: GNR in 4 of 4 (aero and anaero) serratia marcescens (pending susc)  UCx:  sent/pending  Flu/Covid PCR: negative  Historic cultures/susceptibilities:   Thank you for allowing pharmacy to be a part of this patient's care.  11-07-2003, PharmD, BCPS 01/22/2022 7:27 PM

## 2022-01-22 NOTE — Progress Notes (Signed)
PHARMACY CONSULT NOTE  Pharmacy Consult for Electrolyte Monitoring and Replacement   Recent Labs: Potassium (mmol/L)  Date Value  01/22/2022 3.5  01/19/2013 3.6   Magnesium (mg/dL)  Date Value  88/50/2774 1.7   Calcium (mg/dL)  Date Value  12/87/8676 8.4 (L)   Calcium, Total (mg/dL)  Date Value  72/01/4708 8.7   Albumin (g/dL)  Date Value  62/83/6629 2.8 (L)  12/30/2012 3.9   Phosphorus (mg/dL)  Date Value  47/65/4650 2.1 (L)   Sodium (mmol/L)  Date Value  01/22/2022 129 (L)  01/19/2013 140     Assessment: 56 y.o. male who complains of right-sided low back pain dysuria and frequency.  Per chart review patient with multiple admissions for sepsis including pyelonephritis, GN bacteremia & suspected osteomyelitis in his right hip as recently as 01/15/22. There have been multiple attempts to treat with IV antibiotics & perform a TEE and rule out endocarditis but the patient continues to leave AMA. Pharmacy is asked to follow and replace electrolytes while in CCU  Goal of Therapy:  Electrolytes WNL  Plan:  2 grams IV magnesium sulfate x 1 15 mmol sodium phosphate IV x 1 Recheck electrolytes in am  Lowella Bandy ,PharmD Clinical Pharmacist 01/22/2022 7:02 AM

## 2022-01-22 NOTE — Progress Notes (Signed)
Pharmacy Antibiotic Note  Earl Moreno is a 56 y.o. male admitted on 01/21/2022 with  osteomyelitis .  Pharmacy has been consulted for Vancomycin, Cefepime  >> transitioned to [facility] Per ID rec.  Plan: Cefepime 2 gm IV Q8H last dose 9/03 0600 >> merrem 1g IV q8h started 9/03 1415  Vancomycin 1250 mg IV Q24H discontinued per ID recommendation and d/w CCM.     Temp (24hrs), Avg:102.4 F (39.1 C), Min:99.6 F (37.6 C), Max:103.8 F (39.9 C)  Recent Labs  Lab 01/22/22 0255 01/22/22 0855  WBC 14.0*  --   CREATININE 0.80  --   LATICACIDVEN 5.1* 4.9*     Estimated Creatinine Clearance: 75.2 mL/min (by C-G formula based on SCr of 0.8 mg/dL).    No Known Allergies  Antimicrobials this admission:   VAN/CFP/MTZ x1 (9/2)    CFP (9/2>>9/3)   Merrem (9/3>>  Dose adjustments this admission: None at present. CTM and adjust PRN.  Microbiology results:  BCx: GNR in 4 of 4 (aero and anaero) serratia marcescens (pending susc)  UCx:  sent/pending  Flu/Covid PCR: negative  Historic cultures/susceptibilities:   Thank you for allowing pharmacy to be a part of this patient's care.  Sherlynn Carbon Clint Strupp 01/22/2022 1:19 PM

## 2022-01-22 NOTE — Progress Notes (Signed)
Patient to MRI via bed, left ICU around 5:30 pm and returned around 730 pm. Sedated and intubated, on fentanyl and Propofol; also requiring pressors, phenylephrine for BP support. Hypotensive during transport from MRI, but stabilized with increased phenylephrine, decreased sedation. Multiple family members at bedside for most of day, and gave history of heavy heroin (3 bags per day per pt's sister) and crack cocaine use, and homelessness. Per family, pt's girlfriend is currently hospitalized at [facility], and very sick as well, with MRSA. Unable to place additional PIV's, left IJ central line was placed; tolerated well. Does not follow commands, but frowns to pain; occasionally moves lower extremities. Extremely poor dental hygiene with broken and eroded teeth. Temp foley removed due to being incompatible with MRI. Replaced with 16 Fr standard foley. Handoff given to oncoming RN, Christiane Ha.

## 2022-01-22 NOTE — Progress Notes (Signed)
*  PRELIMINARY RESULTS* Echocardiogram 2D Echocardiogram has been performed.  Earl Moreno 01/22/2022, 2:17 PM

## 2022-01-22 NOTE — ED Notes (Signed)
Restraints are loose around pt's wrist.

## 2022-01-22 NOTE — ED Notes (Signed)
Pt is able to move upper extremities while being restrained.

## 2022-01-22 NOTE — Progress Notes (Signed)
PHARMACY - PHYSICIAN COMMUNICATION CRITICAL VALUE ALERT - BLOOD CULTURE IDENTIFICATION (BCID)  Earl Moreno is an 55 y.o. male who presented to [facility] on 01/21/2022 with a chief complaint of pyelonephritis, GN bacteremia & suspected osteomyelitis in his right hip  Assessment:  Sepsis (include suspected source if known)  Name of physician (or Provider) Contacted: none  Current antibiotics: Vancomycin and cefepime  Changes to prescribed antibiotics recommended:  Current Abx therapy covering ID pathogen.  ID following. Will wait for ID provider assessment and recommendations  Results for orders placed or performed during the hospital encounter of 01/21/22  Blood Culture ID Panel (Reflexed) (Collected: 01/22/2022  2:55 AM)  Result Value Ref Range   Enterococcus faecalis NOT DETECTED NOT DETECTED   Enterococcus Faecium NOT DETECTED NOT DETECTED   Listeria monocytogenes NOT DETECTED NOT DETECTED   Staphylococcus species NOT DETECTED NOT DETECTED   Staphylococcus aureus (BCID) NOT DETECTED NOT DETECTED   Staphylococcus epidermidis NOT DETECTED NOT DETECTED   Staphylococcus lugdunensis NOT DETECTED NOT DETECTED   Streptococcus species NOT DETECTED NOT DETECTED   Streptococcus agalactiae NOT DETECTED NOT DETECTED   Streptococcus pneumoniae NOT DETECTED NOT DETECTED   Streptococcus pyogenes NOT DETECTED NOT DETECTED   A.calcoaceticus-baumannii NOT DETECTED NOT DETECTED   Bacteroides fragilis NOT DETECTED NOT DETECTED   Enterobacterales DETECTED (A) NOT DETECTED   Enterobacter cloacae complex NOT DETECTED NOT DETECTED   Escherichia coli NOT DETECTED NOT DETECTED   Klebsiella aerogenes NOT DETECTED NOT DETECTED   Klebsiella oxytoca NOT DETECTED NOT DETECTED   Klebsiella pneumoniae NOT DETECTED NOT DETECTED   Proteus species NOT DETECTED NOT DETECTED   Salmonella species NOT DETECTED NOT DETECTED   Serratia marcescens DETECTED (A) NOT DETECTED   Haemophilus influenzae NOT DETECTED NOT  DETECTED   Neisseria meningitidis NOT DETECTED NOT DETECTED   Pseudomonas aeruginosa NOT DETECTED NOT DETECTED   Stenotrophomonas maltophilia NOT DETECTED NOT DETECTED   Candida albicans NOT DETECTED NOT DETECTED   Candida auris NOT DETECTED NOT DETECTED   Candida glabrata NOT DETECTED NOT DETECTED   Candida krusei NOT DETECTED NOT DETECTED   Candida parapsilosis NOT DETECTED NOT DETECTED   Candida tropicalis NOT DETECTED NOT DETECTED   Cryptococcus neoformans/gattii NOT DETECTED NOT DETECTED   CTX-M ESBL NOT DETECTED NOT DETECTED   Carbapenem resistance IMP NOT DETECTED NOT DETECTED   Carbapenem resistance KPC NOT DETECTED NOT DETECTED   Carbapenem resistance NDM NOT DETECTED NOT DETECTED   Carbapenem resist OXA 48 LIKE NOT DETECTED NOT DETECTED   Carbapenem resistance VIM NOT DETECTED NOT DETECTED   Denver Bentson Rodriguez-Guzman PharmD, BCPS 01/22/2022 1:23 PM

## 2022-01-22 NOTE — Plan of Care (Signed)
Received pt from ED accompanied by RN's and RT. Handoff report received at bedside. Pt is intubated on Mechanical Ventilator, sedated with propofol @ 40 mcg/kg/min. Transferred patient to bed, hooked to cardiac monitor, CHG bath completed. Lactated Ringers 2 Liters currently infusing via L AC PIV at a rate of 999. Maxipime IV completed followed by Vanc IV, flagyl IV to follow. Fentanyl gtt initiated @ 25 mkg/hr to keep pt's RASS score at -2. Current VSS. Will continue to monitor.

## 2022-01-22 NOTE — Plan of Care (Addendum)
Patient had a 2D echo performed today with a concern of endocarditis.  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  This was not present at a prior echo of July 2023.  In addition the most recent echo from [facility] in August also did not show this finding.  Patient will need TEE and potentially Cardio Thoracic Surgery consult.  Patient however still needs work-up of findings noted on CT of the brain today and also findings on his pelvis.  He will have MRI of brain and pelvis to confirm these issues.  Patient's antibiotics were switched to meropenem after chart review by infectious diseases.  Continue supportive care.  Gailen Shelter, MD Advanced Bronchoscopy PCCM [facility]    *This note was dictated using voice recognition software/Dragon.  Despite best efforts to proofread, errors can occur which can change the meaning. Any transcriptional errors that result from this process are unintentional and may not be fully corrected at the time of dictation.

## 2022-01-22 NOTE — Progress Notes (Signed)
Pharmacy Antibiotic Note  Earl Moreno is a 56 y.o. male admitted on 01/21/2022 with  osteomyelitis .  Pharmacy has been consulted for Vancomycin, Cefepime dosing.  Plan: Cefepime 2 gm IV X 1 given in ED on 9/3 @ 0531. Cefepime 2 gm IV Q8H ordered to continue on 9/3 @ 1330.  Vancomycin 1250 mg IV X 1 ordered for 9/3 @ ~ 0700. Vancomycin 1250 mg IV Q24H ordered to continue on 9/3 @ ~ 0700.  AUC = 497.7 Vanc trough = 9.3      Temp (24hrs), Avg:100.1 F (37.8 C), Min:99.6 F (37.6 C), Max:100.5 F (38.1 C)  Recent Labs  Lab 01/15/22 0832 01/22/22 0255  WBC  --  14.0*  CREATININE  --  0.80  LATICACIDVEN 1.7 5.1*    Estimated Creatinine Clearance: 75.2 mL/min (by C-G formula based on SCr of 0.8 mg/dL).    No Known Allergies  Antimicrobials this admission:   >>    >>   Dose adjustments this admission:   Microbiology results:  BCx:   UCx:    Sputum:    MRSA PCR:   Thank you for allowing pharmacy to be a part of this patient's care.  [place] Perra D 01/22/2022 5:57 AM

## 2022-01-23 ENCOUNTER — Inpatient Hospital Stay: Payer: Medicaid Other

## 2022-01-23 ENCOUNTER — Encounter (HOSPITAL_COMMUNITY): Payer: Self-pay

## 2022-01-23 ENCOUNTER — Inpatient Hospital Stay: Admit: 2022-01-23 | Payer: Self-pay

## 2022-01-23 LAB — BLOOD GAS, ARTERIAL
Acid-Base Excess: 1.3 mmol/L (ref 0.0–2.0)
Bicarbonate: 24.2 mmol/L (ref 20.0–28.0)
FIO2: 28 %
MECHVT: 500 mL
O2 Saturation: 99.8 %
PEEP: 5 cmH2O
Patient temperature: 37
RATE: 10 resp/min
pCO2 arterial: 31 mmHg — ABNORMAL LOW (ref 32–48)
pH, Arterial: 7.5 — ABNORMAL HIGH (ref 7.35–7.45)
pO2, Arterial: 100 mmHg (ref 83–108)

## 2022-01-23 LAB — BLOOD CULTURE ID PANEL (REFLEXED) - BCID2

## 2022-01-23 LAB — URINE CULTURE

## 2022-01-23 LAB — RENAL FUNCTION PANEL
Albumin: 2.3 g/dL — ABNORMAL LOW (ref 3.5–5.0)
Anion gap: 7 (ref 5–15)
BUN: 11 mg/dL (ref 6–20)
CO2: 24 mmol/L (ref 22–32)
Calcium: 7.4 mg/dL — ABNORMAL LOW (ref 8.9–10.3)
Chloride: 107 mmol/L (ref 98–111)
Creatinine, Ser: 0.66 mg/dL (ref 0.61–1.24)
GFR, Estimated: >60 mL/min (ref >60)
Glucose, Bld: 111 mg/dL — ABNORMAL HIGH (ref 70–99)
Phosphorus: 2 mg/dL — ABNORMAL LOW (ref 2.5–4.6)
Potassium: 3.2 mmol/L — ABNORMAL LOW (ref 3.5–5.1)
Sodium: 138 mmol/L (ref 135–145)

## 2022-01-23 LAB — GLUCOSE, CAPILLARY
Glucose-Capillary: 104 mg/dL — ABNORMAL HIGH (ref 70–99)
Glucose-Capillary: 115 mg/dL — ABNORMAL HIGH (ref 70–99)
Glucose-Capillary: 119 mg/dL — ABNORMAL HIGH (ref 70–99)
Glucose-Capillary: 138 mg/dL — ABNORMAL HIGH (ref 70–99)
Glucose-Capillary: 161 mg/dL — ABNORMAL HIGH (ref 70–99)
Glucose-Capillary: 178 mg/dL — ABNORMAL HIGH (ref 70–99)

## 2022-01-23 LAB — CBC
HCT: 26.6 % — ABNORMAL LOW (ref 39.0–52.0)
Hemoglobin: 8.8 g/dL — ABNORMAL LOW (ref 13.0–17.0)
MCH: 28.4 pg (ref 26.0–34.0)
MCHC: 33.1 g/dL (ref 30.0–36.0)
MCV: 85.8 fL (ref 80.0–100.0)
Platelets: 106 10*3/uL — ABNORMAL LOW (ref 150–400)
RBC: 3.1 MIL/uL — ABNORMAL LOW (ref 4.22–5.81)
RDW: 13.7 % (ref 11.5–15.5)
WBC: 16 10*3/uL — ABNORMAL HIGH (ref 4.0–10.5)
nRBC: 0 % (ref 0.0–0.2)

## 2022-01-23 LAB — TRIGLYCERIDES: Triglycerides: 135 mg/dL (ref <150)

## 2022-01-23 LAB — MAGNESIUM: Magnesium: 1.8 mg/dL (ref 1.7–2.4)

## 2022-01-23 LAB — CK: Total CK: 47 U/L — ABNORMAL LOW (ref 49–397)

## 2022-01-23 MED ORDER — POTASSIUM CHLORIDE 20 MEQ PO PACK
40.0000 meq | PACK | Freq: Once | ORAL | Status: AC
  Administered 2022-01-23: 40 meq
  Filled 2022-01-23: qty 2

## 2022-01-23 MED ORDER — INSULIN ASPART 100 UNIT/ML IJ SOLN
0.0000 [IU] | INTRAMUSCULAR | Status: DC
  Administered 2022-01-23: 2 [IU] via SUBCUTANEOUS
  Administered 2022-01-23 – 2022-01-24 (×2): 3 [IU] via SUBCUTANEOUS
  Administered 2022-01-24: 2 [IU] via SUBCUTANEOUS
  Administered 2022-01-24 (×2): 3 [IU] via SUBCUTANEOUS
  Administered 2022-01-24: 2 [IU] via SUBCUTANEOUS
  Administered 2022-01-25 (×3): 3 [IU] via SUBCUTANEOUS
  Administered 2022-01-25: 2 [IU] via SUBCUTANEOUS
  Administered 2022-01-25 – 2022-01-26 (×4): 3 [IU] via SUBCUTANEOUS
  Administered 2022-01-26: 2 [IU] via SUBCUTANEOUS
  Filled 2022-01-23 (×16): qty 1

## 2022-01-23 MED ORDER — IPRATROPIUM-ALBUTEROL 0.5-2.5 (3) MG/3ML IN SOLN
3.0000 mL | Freq: Four times a day (QID) | RESPIRATORY_TRACT | Status: DC
Start: 2022-01-23 — End: 2022-01-24
  Administered 2022-01-23 – 2022-01-24 (×4): 3 mL via RESPIRATORY_TRACT
  Filled 2022-01-23 (×4): qty 3

## 2022-01-23 MED ORDER — THIAMINE HCL 100 MG/ML IJ SOLN
100.0000 mg | Freq: Every day | INTRAMUSCULAR | Status: DC
  Administered 2022-01-23 – 2022-01-25 (×3): 100 mg via INTRAVENOUS
  Filled 2022-01-23 (×3): qty 2

## 2022-01-23 MED ORDER — MAGNESIUM SULFATE 2 GM/50ML IV SOLN
2.0000 g | Freq: Once | INTRAVENOUS | Status: AC
Start: 2022-01-23 — End: 2022-01-23
  Administered 2022-01-23: 2 g via INTRAVENOUS
  Filled 2022-01-23: qty 50

## 2022-01-23 MED ORDER — ADULT MULTIVITAMIN LIQUID CH
15.0000 mL | Freq: Every day | ORAL | Status: DC
  Administered 2022-01-24 – 2022-01-25 (×2): 15 mL
  Filled 2022-01-23 (×4): qty 15

## 2022-01-23 MED ORDER — VITAL AF 1.2 CAL PO LIQD
1000.0000 mL | ORAL | Status: DC
Start: 2022-01-23 — End: 2022-01-25
  Administered 2022-01-23: 1000 mL

## 2022-01-23 MED ORDER — DEXTROSE 5 % IV SOLN
15.0000 mmol | Freq: Once | INTRAVENOUS | Status: AC
  Administered 2022-01-23: 15 mmol via INTRAVENOUS
  Filled 2022-01-23: qty 5

## 2022-01-23 MED ORDER — FREE WATER
30.0000 mL | Status: DC
  Administered 2022-01-23 – 2022-01-25 (×12): 30 mL

## 2022-01-23 MED ORDER — INSULIN GLARGINE-YFGN 100 UNIT/ML ~~LOC~~ SOLN
10.0000 [IU] | Freq: Every day | SUBCUTANEOUS | Status: DC
  Administered 2022-01-23 – 2022-01-26 (×4): 10 [IU] via SUBCUTANEOUS
  Filled 2022-01-23 (×4): qty 0.1

## 2022-01-23 NOTE — Progress Notes (Signed)
NAME:  Earl Moreno, MRN:  [id number], DOB:  [birth year], LOS: 1 ADMISSION DATE:  01/21/2022, INITIAL CONSULTATION DATE:  01/22/22 REFERRING MD:  Dr. Leonides Schanz, CHIEF COMPLAINT:  Altered Mental Status  History of Present Illness:  56 yo M presenting to [facility] ED on 01/21/22 via EMS complaining that he does not feel well. History provided by ED documentation as there is no family available and patient intubated and sedated. EMS reported that they think he has a son who called 911.  Per chart review patient with multiple admissions for sepsis including pyelonephritis, GN bacteremia & suspected osteomyelitis in his right hip as recently as 01/15/22. There have been multiple attempts to treat with IV antibiotics & perform a TEE and rule out endocarditis but the patient continues to leave AMA.  ED course: Upon arrival patient combative asking for help but then telling staff to leave I'm alone. Patient febrile & tachycardic. Sepsis protocol initiated but hindered by the fact that the patient was so uncooperative. Patient received Ativan and Haldol in order to restrain him enough to place a PIV. Lab work significant for lactic acidosis, leukocytosis with a left shift & an elevated PCT. Mild hyponatremia, hyperglycemia & an elevated troponin. UDS positive for cocaine & opiate. CT head concerning for possible subacute cortical infarct. Precedex drip was started, but even at max the patient still thrashing in the bed. Medications given: acetaminophen, cefepime & vancomycin, Geodon, ativan, haldol, etomidate & rocuronium, 2 L of LR Initial Vitals: 100.5, 20, 111, 116/68 & spO2 98 % on RA Significant labs: (Labs/ Imaging personally reviewed) EKG Interpretation: Date: 01/22/22, EKG Time: 02:30, Rate: 120, Rhythm: ST, QRS Axis:  LAD, Intervals: normal, ST/T Wave abnormalities: none, Narrative Interpretation: ST Chemistry: Na+: 129 (when corrected for glucose- 134, K+: 3.5, BUN/Cr.: 9/0.80, Serum CO2/ AG: 24/  15 Hematology: WBC: 14.0, Hgb: 12.4,  Troponin: 89, CK: 207, Lactic/ PCT: 5.1/ 2.98, COVID-19 & Influenza A/B: negative UDS: +cocaine & +opiates ABG: 7.55/ 30/ 157/ 26.2  CXR 01/22/22: no acute cardiopulmonary abnormalities CT head wo contrast 01/22/22: Focal loss of gray-white matter differentiation within the right parietal cortex which may reflect a subacute cortical infarct, though this is not well characterized on this examination. No associated mass effect or midline shift. Contrast enhanced MRI examination is recommended for further evaluation CT chest/abdomen/pelvis w contrast 01/22/22: No acute or inflammatory process identified in the chest. Satisfactory endotracheal and enteric tubes. Abdomen and pelvis remarkable for:- abnormal but nonspecific small to moderate volume of simple appearing free fluid in the pelvis. Paucity of intra-abdominal fat limits bowel detail, but no bowel obstruction or inflammation is evident.- small hypoenhancing areas in the superior spleen, with underlying borderline splenomegaly. Although nonspecific consider small splenic infarcts or hematogenous infection in this setting.- focal bone erosion of the posteroinferior right pubic ramus since last July. But no other lytic osseous lesion identified, and no regional inflammation. This would be an unusual site for osteomyelitis. Query a metabolic (such as parathyroid) bone disorder. Metastatic disease also not excluded.  PCCM consulted for admission due to mechanical intubation and support in the setting of severe sepsis.  Pertinent  Medical History  Heroin/cocaine abuse T2DM Splenic infarct GN sepsis Hyponatremia Homeless  Significant Hospital Events: Including procedures, antibiotic start and stop dates in addition to other pertinent events   01/22/22: admit to ICU after intubation and mechanical ventilation due to AMS and sedative administration as well as severe sepsis s/t suspected osteomyelitis 01/22/22:  Echocardiogram confirms large  aortic valve vegetation, patient was septic shock being stabilized, MRI brain with small areas of diffusion abnormality right greater than left parieto-occipital regions consistent with evolving subacute ischemic infarcts, short interval follow-up MRI suggested.  MR pelvis osteomyelitis the site of the right hamstring tendon origin possible myositis, bilateral iliopsoas bursitis 01/23/22: Discussed with cardiology for potential TEE however, the recommendation was for transfer to [facility] for TEE and cardiothoracic surgery evaluation.   Interim History / Subjective:  Patient intubated and sedated. Pressor requirements decreasing.  Adequately volume resuscitated.   Objective   Blood pressure 97/65, pulse 88, temperature 99.1 F (37.3 C), temperature source Oral, resp. rate 20, SpO2 100 %.    Vent Mode: PRVC FiO2 (%):  [28 %] 28 % Set Rate:  [10 bmp] 10 bmp Vt Set:  [500 mL] 500 mL PEEP:  [5 cmH20] 5 cmH20 Plateau Pressure:  [14 cmH20] 14 cmH20   Intake/Output Summary (Last 24 hours) at 01/23/2022 0855 Last data filed at 01/23/2022 8185 Gross per 24 hour  Intake 6386.86 ml  Output 2380 ml  Net 4006.86 ml   Examination: General: Adult male, critically ill, lying in bed intubated & sedated requiring mechanical ventilation, NAD HEENT: MM pink/moist, anicteric, atraumatic, neck supple Neuro: RASS: -4, unable to follow commands, PERRL +3, MAE CV: s1s2 RRR, NSR on monitor, decrescendo grade 2/6 early diastolic blowing murmur at the left lower sternal border distant with AI.   Pulm: Regular, non labored on PRVC 40% & PEEP 5, breath sounds clear-BUL & diminished-BLL GI: soft, flat, bs x 4 GU: foley in place with clear yellow urine Skin:  no rashes/lesions noted Extremities: warm/dry, pulses + 2 R/P, no edema noted   Representative images from echocardiogram performed 9/3 showing large aortic valve vegetation:    Representative image from MRI performed 9/3  showing evolving ischemic infarct:   Representative image from MRI pelvis ninth/3 showing osteomyelitis on right:    Chest x-ray 9/4, ET tube 6.3 cm above carina, advanced after film.  Support apparatus, otherwise, in satisfactory position:    Resolved Hospital Problem list     Assessment & Plan:  Severe Sepsis with septic shock due to osteomyelitis in the RIGHT hip/ aortic valve endocarditis Initial interventions/workup included: 2 L of NS/LR & Cefepime/ Vancomycin - Patient intubated, mechanically ventilated - f/u cultures, trend lactic/ PCT > STAT lactic  - Daily CBC, monitor WBC/ fever curve - IV antibiotics: Meropenem & vancomycin  - MRI pelvis shows osteomyelitis - IVF volume resuscitation has been achieved, continue maintenance IVF - Currently on Neo-Synephrine with requirements decreasing - Strict I/O's: alert provider if UOP < 0.5 mL/kg/hr - will need TEE and possible cardiothoracic evaluation - Provider to provider consultation with [facility] critical care team being done - consulted with infectious Disease, appreciate input, antibiotic changed from cefepime to meropenem done - Discontinue vancomycin since cultures do not support ongoing use, MRSA screen was negative - Initial blood cultures positive for Serratia marcescens  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 - Wean PEEP & FiO2 as tolerated, maintain SpO2 > 90% - Head of bed elevated 30 degrees, VAP protocol in  place - Plateau pressures less than 30 cm H20  -  Intermittent chest x-ray & ABG PRN - Daily WUA with SBT as tolerated  - Ensure adequate pulmonary hygiene  - PAD protocol in place: continue Fentanyl drip & Propofol drip - ET tube advanced 2 to 3 cm due to high position - Bronchodilators for mucociliary clearance  Uncontrolled Type 2 Diabetes Mellitus Hemoglobin A1C: 13.5 (11/2021) - Monitor CBG Q 4 hour - SSI and long-acting insulins adjusted - SSI moderate dosing, will start glargine insulin 10 units nightly - target range while in ICU: 140-180 - follow ICU hyper/hypo-glycemia protocol - Diabetes Coordinator following, appreciate recommendations  Acute Encephalopathy suspect multifocal in the setting of cocaine & opiate substance abuse and severe sepsis Query septic emboli infarcts - neuro checks Q 4 - supportive care - MRI brain consistent with evolving subacute ischemic infarcts  Elevated Troponin secondary to demand ischemia - Trend troponins  - consider systemic anticoagulation depending on trend  - continuous cardiac monitoring - will need TEE given endocarditis  Hypokalemia - Due to volume resuscitation/malnutrition - Replete potassium  Severe protein calorie malnutrition - Poor nutritional status in the setting of substance abuse, chronic illness - Initiate tube feeds - Multivitamin supplementation  Best Practice (right click and "Reselect all SmartList Selections" daily)  Diet/type: NPO w/ meds via tube DVT prophylaxis: LMWH GI prophylaxis: PPI Lines: N/A Foley:  Yes, and it is still needed Code Status:  full code Last date of multidisciplinary goals of care discussion [9/4]  Discussed with patient's son, [name], and patient's sister Anselm Jungling.  Patient's aunt also present during discussion.  They have been apprised of the patient's critically ill condition.  Currently full support is desired.  They have been advised of potential transfer to [facility].  Labs   CBC: Recent Labs  Lab 01/22/22 0255  01/23/22 0519  WBC 14.0* 16.0*  NEUTROABS 12.9*  --   HGB 12.4* 8.8*  HCT 38.2* 26.6*  MCV 87.2 85.8  PLT 250 106*     Basic Metabolic Panel: Recent Labs  Lab 01/22/22 0255 01/22/22 0520 01/22/22 2044 01/23/22 0519  NA 129*  --  135 138  K 3.5  --  2.3* 3.2*  CL 90*  --   --  107  CO2 24  --   --  24  GLUCOSE 304*  --   --  111*  BUN 9  --   --  11  CREATININE 0.80  --   --  0.66  CALCIUM 8.4*  --   --  7.4*  MG  --  1.7 1.9 1.8  PHOS  --  2.1* 3.2 2.0*    GFR: Estimated Creatinine Clearance: 75.2 mL/min (by C-G formula based on SCr of 0.66 mg/dL). Recent Labs  Lab 01/22/22 0255 01/22/22 0855 01/22/22 2044 01/23/22 0519  PROCALCITON 2.98  --   --   --   WBC 14.0*  --   --  16.0*  LATICACIDVEN 5.1* 4.9* 1.8  --      Liver Function Tests: Recent Labs  Lab 01/22/22 0255 01/23/22 0519  AST 33  --   ALT 14  --   ALKPHOS 117  --   BILITOT 1.1  --   PROT 9.0*  --   ALBUMIN 2.8* 2.3*    No results for input(s): "LIPASE", "AMYLASE" in the last 168 hours. Recent Labs  Lab 01/22/22 0255  AMMONIA 16     ABG    Component Value Date/Time  PHART 7.5 (H) 01/23/2022 1040   PCO2ART 31 (L) 01/23/2022 1040   PO2ART 100 01/23/2022 1040   HCO3 24.2 01/23/2022 1040   O2SAT 99.8 01/23/2022 1040     Coagulation Profile: Recent Labs  Lab 01/22/22 0255  INR 1.3*     Cardiac Enzymes: Recent Labs  Lab 01/22/22 0255  CKTOTAL 207     HbA1C: Hgb A1c MFr Bld  Date/Time Value Ref Range Status  11/29/2021 02:02 PM 13.2 (H) 4.8 - 5.6 % Final    Comment:    (NOTE) Pre diabetes:          5.7%-6.4%  Diabetes:              >6.4%  Glycemic control for   <7.0% adults with diabetes   10/19/2020 04:44 PM 14.4 (H) 4.8 - 5.6 % Final    Comment:    (NOTE)         Prediabetes: 5.7 - 6.4         Diabetes: >6.4         Glycemic control for adults with diabetes: <7.0     CBG: Recent Labs  Lab 01/22/22 1458 01/22/22 1938 01/22/22 2340 01/23/22 0319  01/23/22 0733  GLUCAP 91 122* 131* 104* 119*     Review of Systems:   UTA- patient intubated and sedated  Allergies No Known Allergies   Home Medications  Prior to Admission medications   Medication Sig Start Date End Date Taking? Authorizing Provider  amLODipine (NORVASC) 10 MG tablet Take 1 tablet (10 mg total) by mouth daily. 12/02/21   Barb Merino, MD  Baclofen 5 MG TABS Take 5 mg by mouth 3 (three) times daily as needed for muscle spasms. 12/02/21   Barb Merino, MD  Blood Glucose Monitoring Suppl (BLOOD GLUCOSE MONITOR SYSTEM) w/Device KIT USE UP TO 4 TIMES DAILY AS DIRECTED. 02/26/21   [name], MD  Blood Glucose Monitoring Suppl (RIGHTEST [id number] BLOOD GLUCOSE) w/Device KIT Use as directed to test blood sugar up to 4 times daily 12/02/21   Barb Merino, MD  glucose blood (RIGHTEST [phone] BLOOD GLUCOSE) test strip Use as directed to check blood sugar up to 4 times daily 12/02/21   Barb Merino, MD  Insulin Lispro Prot & Lispro (HUMALOG MIX 75/25 KWIKPEN) (75-25) 100 UNIT/ML Kwikpen Inject 18 Units into the skin 2 (two) times daily. 12/02/21   Barb Merino, MD  Insulin Pen Needle (COMFORT EZ PEN NEEDLES) 32G X 4 MM MISC USE AS DIRECTED TWICE DAILY (MORNING AND BEDTIME) 12/02/21   Barb Merino, MD  levofloxacin (LEVAQUIN) 750 MG tablet Take 750 mg by mouth daily. 01/12/22 01/26/22  [provider]  Rightest GL300 Lancets MISC USE AS DIRECTED UP TO 4 TIMES DAILY. 12/02/21   Barb Merino, MD    Scheduled Meds:  Chlorhexidine Gluconate Cloth  6 each Topical Daily   docusate  100 mg Per Tube BID   enoxaparin (LOVENOX) injection  40 mg Subcutaneous Q24H   insulin aspart  0-15 Units Subcutaneous Q4H   insulin glargine-yfgn  10 Units Subcutaneous QHS   mouth rinse  15 mL Mouth Rinse Q2H   pantoprazole  40 mg Per Tube Daily   polyethylene glycol  17 g Per Tube Daily   Continuous Infusions:  sodium chloride Stopped (01/23/22 0508)   albumin human 25 g (01/23/22  0838)   fentaNYL infusion INTRAVENOUS 30 mcg/hr (01/23/22 0626)   lactated ringers 125 mL/hr at 01/23/22 0632   meropenem (MERREM) IV Stopped (  01/23/22 0538)   phenylephrine (NEO-SYNEPHRINE) Adult infusion 25 mcg/min (01/23/22 0840)   potassium PHOSPHATE IVPB (in mmol) 15 mmol (01/23/22 0843)   propofol (DIPRIVAN) infusion 25 mcg/kg/min (01/23/22 0631)   vancomycin 166.7 mL/hr at 01/23/22 0626   PRN Meds:.sodium chloride, acetaminophen, docusate sodium, midazolam, midazolam, mouth rinse   Critical care time: 60 minutes    The patient is critically ill with multiple organ systems failure and requires high complexity decision making for assessment and support, frequent evaluation and titration of therapies, application of advanced monitoring technologies and extensive interpretation of multiple databases.   Overall prognosis is exceedingly poor.  Mortality from Serratia endocarditis as high as 85%.  Continue supportive care.   Renold Don, MD Advanced Bronchoscopy PCCM [facility]    *This note was dictated using voice recognition software/Dragon.  Despite best efforts to proofread, errors can occur which can change the meaning. Any transcriptional errors that result from this process are unintentional and may not be fully corrected at the time of dictation.

## 2022-01-23 NOTE — Progress Notes (Signed)
Patient biting down on EET. Eyes flutter to voice and pain. Turning/jerking head side to side. Not following or acknowledging commands.  Fent and Prop rate adjusted accordingly to maintain RASS goal.

## 2022-01-23 NOTE — TOC Initial Note (Signed)
Transition of Care [facility]) - Initial/Assessment Note    Patient Details  Name: Earl Moreno MRN: [id number] Date of Birth: 01-06-1966  Transition of Care [facility]) CM/SW Contact:    Shelbie Hutching, RN Phone Number: 01/23/2022, 10:23 AM  Clinical Narrative:                 Patient admitted to the hospital with severe sepsis.  Patient has a history of polysubstance abuse, IV drug use, suspected osteomyelitis, multiple admissions where patient left AMA.    Patient's son, Earl Moreno, has been staying with the patient for the past few days, patient was living in a car with his significant other of 15 years, Earl Moreno, then they got a boarding room for one night before Earl Moreno ended up at [facility] after having a stroke, then patient went to stay with a friend and his son came to stay with him to try and take care of him.  Earl Moreno reports the patient has been really sick and weak to the point where he could not get to the bathroom himself.  Earl Moreno finally convinced patient to come to the hospital.  Patient is currently in the ICU intubated and sedated.  Patient found to have acute stroke and endocarditis.  Plan for transfer to [facility] for cardiothoracic evaluation.  RNCM met with patient's son, Earl Moreno in the waiting area, patient's sister and cousin also present.  Earl Moreno reports that he will do whatever is needed to help the patient and convince him to stay in the hospital for as long as needed.  Earl Moreno filed for the patient to get SSI, he has an appointment on Sept 28th with the [place].  Earl Moreno would like help applying for Medicaid for the patient.  RNCM will reach out to the financial counselor for assistance.    Earl Moreno is the next of kin and primary decision maker, he says he is not the only child but he is the oldest.    TOC will cont to follow and assist with disposition.   Expected Discharge Plan: Skilled Nursing Facility Barriers to Discharge: Continued Medical Work up   Patient Goals and CMS Choice Patient states  their goals for this hospitalization and ongoing recovery are:: patient unable to state, intubated      Expected Discharge Plan and Services Expected Discharge Plan: [place]   Discharge Planning Services: CM Consult   Living arrangements for the past 2 months: Apartment, Homeless                 DME Arranged: N/A DME Agency: NA                  Prior Living Arrangements/Services Living arrangements for the past 2 months: Apartment, Homeless Lives with:: Significant Other, Friends Patient language and need for interpreter reviewed:: Yes Do you feel safe going back to the place where you live?: No   patietn is homeless- has been living with a friend for a few days  Need for Family Participation in Patient Care: Yes (Comment) Care giver support system in place?: Yes (comment) (son, sister, friend) Current home services: DME (cane) Criminal Activity/Legal Involvement Pertinent to Current Situation/Hospitalization: No - Comment as needed  Activities of Daily Living      Permission Sought/Granted Permission sought to share information with : Case Manager, Family Supports Permission granted to share information with : Yes, Verbal Permission Granted  Share Information with NAME: [name]     Permission granted to share info  w Relationship: son  Permission granted to share info w Contact Information: [phone]  Emotional Assessment Appearance:: Appears older than stated age Attitude/Demeanor/Rapport: Intubated (Following Commands or Not Following Commands) Affect (typically observed): Unable to Assess   Alcohol / Substance Use: Illicit Drugs Psych Involvement: No (comment)  Admission diagnosis:  Agitation [R45.1] Severe sepsis ([place]) [A41.9, R65.20] Altered mental status, unspecified altered mental status type [R41.82] Acute sepsis [facility]) [A41.9] Patient Active Problem List   Diagnosis Date Noted   Severe sepsis ([place]) 01/22/2022   Altered mental  status    Gram-negative bacteremia    Pyelonephritis of right kidney 01/15/2022   Hypokalemia 01/15/2022   Pelvic mass 01/15/2022   Behavioral problem 01/15/2022   Acute pyelonephritis 11/29/2021   Abscess of multiple sites    Hyperosmolar hyperglycemic state (HHS) ([place])    Polysubstance abuse ([place])    Protein-calorie malnutrition, severe 02/11/2020   New onset type 2 diabetes mellitus ([place]) 02/09/2020   Hyperglycemia due to type 2 diabetes mellitus ([place]) 02/09/2020   Hyperglycemia due to diabetes mellitus ([place]) 02/09/2020   Abscess 02/09/2020   Heroin addiction ([place]) 02/09/2020   Hyponatremia 02/09/2020   Essential hypertension 02/09/2020   Cellulitis of left hand 06/02/2018   PCP:  Pcp, No Pharmacy:   [facility] [address] [place]) [place] [address] Phone: [phone] Fax: [phone]  [place] [address] Phone: [phone] Fax: [phone]     Social Determinants of Health (SDOH) Interventions    Readmission Risk Interventions    01/23/2022   10:21 AM  Readmission Risk Prevention Plan  Transportation Screening Complete  Medication Review (RN Care Manager) Complete  PCP or Specialist appointment within 3-5 days of discharge Complete  HRI or [place] Complete  SW Recovery Care/Counseling Consult Complete  [place] Not Applicable

## 2022-01-23 NOTE — Inpatient Diabetes Management (Signed)
Inpatient Diabetes Program Recommendations  AACE/ADA: New Consensus Statement on Inpatient Glycemic Control   Target Ranges:  Prepandial:   less than 140 mg/dL      Peak postprandial:   less than 180 mg/dL (1-2 hours)      Critically ill patients:  140 - 180 mg/dL    Latest Reference Range & Units 01/23/22 03:19 01/23/22 07:33  Glucose-Capillary 70 - 99 mg/dL 833 (H) 582 (H)    Latest Reference Range & Units 01/22/22 08:09 01/22/22 12:56 01/22/22 14:58 01/22/22 19:38 01/22/22 23:40  Glucose-Capillary 70 - 99 mg/dL 518 (H) 984 (H) 91 210 (H) 131 (H)   Review of Glycemic Control  Diabetes history: DM2 Outpatient Diabetes medications: Humalog 75/25 18 units BID Current orders for Inpatient glycemic control: Semglee 10 units BID, Novolog 0-20 units Q4H  Inpatient Diabetes Program Recommendations:    Insulin: Patient has only received Semglee 10 units one time since admission and CBGs today 104/119 mg/dl. May want to consider decreasing Semglee to 10 units QHS and Novolog correction to 0-15 units Q4H.  NOTE: Inpatient diabetes coordinator last spoke with patient at length on 11/30/21 during prior admission. Patient is currently intubated, on ventilator, and sedated. Will follow along while inpatient.  Thanks, [name], RN, MSN, CDCES Diabetes Coordinator Inpatient Diabetes Program [phone] (Team Pager from 8am to 5pm)

## 2022-01-23 NOTE — Plan of Care (Signed)
Discussed potential transfer to [facility] for cardiothoracic evaluation in the setting of aortic endocarditis.  Discussed with critical care attending on call Dr. Thayer Headings who conferred with cardiothoracic team, Dr. Laneta Simmers) and neuro intensivist.  Patient has a large vegetation in the aortic valve.  However, MRI has shown evidence of multiple infarcts these are likely embolic.  Given this circumstance cardiothoracic intervention will not be possible due to high risk of further embolization during procedure.  The patient will have to be medically managed.  We will discuss with cardiology to perform TEE however the vegetation is easily seen on TTE.  Blood cultures are positive for Serratia marcescens.  This is a highly unusual organism for endocarditis however given the patient's IV drug abuse it is an organism that can be noted in this at risk population.  All comers mortality with Serratia endocarditis without surgery is 85%, given the patient's other comorbidities his likelihood of mortality is higher.  We will discuss with the patient's family.  We will continue supportive care and antibiotics.  Will recommend Palliative Care consultation.   Gailen Shelter, MD Advanced Bronchoscopy PCCM [facility]    *This note was dictated using voice recognition software/Dragon.  Despite best efforts to proofread, errors can occur which can change the meaning. Any transcriptional errors that result from this process are unintentional and may not be fully corrected at the time of dictation.

## 2022-01-23 NOTE — Progress Notes (Addendum)
Initial Nutrition Assessment  DOCUMENTATION CODES:   Severe malnutrition in context of social or environmental circumstances  INTERVENTION:   Vital 1.2@55ml /hr- Initiate at 98ml/hr and increase by 58ml/hr q 8 hours until goal rate is reached.   Free water flushes 77ml q4 hours to maintain tube patency   Propofol: 10.96 ml/hr- provides 289kcal/day   Regimen provides 1873kcal/day, 99g/day protein and 1284ml/day of free water.   Pt at high refeed risk; recommend monitor potassium, magnesium and phosphorus labs daily until stable  NUTRITION DIAGNOSIS:   Severe Malnutrition related to social / environmental circumstances as evidenced by severe muscle depletion, severe fat depletion.  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 MRI brain consistent with evolving subacute ischemic infarcts.  Pt sedated and ventilated. OGT in place. Will plan to initiate tube feeds today. Pt is at high refeed risk.   Per chart, pt is down ~40lbs over the past couple of years. Pt appears to be down ~15lbs(12%) since October; RD unsure how recently weight loss occurred.   Medications reviewed and include: colace, lovenox, insulin, MVI, protonix, miralax, thiamine, albumin, LRS @125ml /hr, meropenem, neo-synephrine, Kphos, propofol   Labs reviewed: K 3.2(L), P 2.0(L), Mg 1.8 wnl Wbc- 16.0(H), Hgb 8.8(L), Hct 26.6(L) Cbgs- 138, 119, 104 x 24 hrs AIC 13.2(H)- 7/11  Patient is currently intubated on ventilator support MV: 10.0 L/min Temp (24hrs), Avg:100.3 F (37.9 C), Min:98.6 F (37 C), Max:102.6 F (39.2 C)  Propofol: 10.96 ml/hr- provides  289kcal/day   MAP- >73mmHg   UOP- 77m   NUTRITION - FOCUSED PHYSICAL EXAM:  Flowsheet Row Most Recent Value  Orbital Region Moderate depletion  Upper Arm Region Severe depletion  Thoracic and Lumbar Region Severe depletion  Buccal Region Mild depletion  Temple Region Moderate depletion  Clavicle Bone Region Severe depletion  Clavicle and Acromion Bone Region Severe depletion  Scapular Bone Region Moderate depletion  Dorsal Hand Moderate depletion  Patellar Region Severe depletion  Anterior Thigh Region Severe depletion  Posterior Calf Region Severe depletion  Edema (RD Assessment) None  Hair Reviewed  Eyes Reviewed  Mouth Reviewed  Skin Reviewed  Nails Reviewed   Diet Order:   Diet Order             Diet NPO time specified  Diet effective now                  EDUCATION NEEDS:   No education needs have been identified at this time  Skin:  Skin Assessment: Reviewed RN Assessment  Last BM:  pta  Height:   Ht Readings from Last 1 Encounters:  01/14/22 5' 8.11" (1.73 m)    Weight:   Wt Readings from Last 1 Encounters:  01/14/22 52.2 kg    Ideal Body Weight:  72.7 kg  BMI:  There is no height or weight on file to calculate BMI.  Estimated Nutritional Needs:   Kcal:  1931kcal/day  Protein:  85-95g/day  Fluid:  1.6-1.8L/day  01/16/22 MS, RD, LDN Please refer to [facility] for RD and/or RD on-call/weekend/after hours pager

## 2022-01-23 NOTE — Progress Notes (Signed)
PHARMACY CONSULT NOTE  Pharmacy Consult for Electrolyte Monitoring and Replacement   Recent Labs: Potassium (mmol/L)  Date Value  01/23/2022 3.2 (L)  01/19/2013 3.6   Magnesium (mg/dL)  Date Value  16/02/9603 1.8   Calcium (mg/dL)  Date Value  54/01/8118 7.4 (L)   Calcium, Total (mg/dL)  Date Value  14/78/2956 8.7   Albumin (g/dL)  Date Value  21/30/8657 2.3 (L)  12/30/2012 3.9   Phosphorus (mg/dL)  Date Value  84/69/6295 2.0 (L)   Sodium (mmol/L)  Date Value  01/23/2022 138  01/19/2013 140     Assessment: 56 y.o. male who complains of right-sided low back pain dysuria and frequency.  Per chart review patient with multiple admissions for sepsis including pyelonephritis, GN bacteremia & suspected osteomyelitis in his right hip as recently as 01/15/22. There have been multiple attempts to treat with IV antibiotics & perform a TEE and rule out endocarditis but the patient continues to leave AMA. Pharmacy is asked to follow and replace electrolytes while in CCU  Goal of Therapy:  Electrolytes WNL  Plan:  2 grams IV magnesium sulfate x 1 15 mmol potassium phosphate IV and potasssium chloride 53mEq(via tube) x 1 each Recheck electrolytes in am  Bettey Costa ,PharmD Clinical Pharmacist 01/23/2022 12:01 PM

## 2022-01-23 NOTE — IPAL (Signed)
  Interdisciplinary Goals of Care Family Meeting   Date carried out: 01/23/2022  Location of the meeting: Conference room  Member's involved: Physician and Family Member or next of kin  Durable Power of Attorney or acting medical decision maker: Patient's son Earl Moreno.    Discussion: We discussed goals of care for Earl Moreno .  Family members present worse patient's son Earl Moreno, patient's sister and aunt.  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  Potential transfer for [facility] not a feasible option.  Explained the implications of potential CODE BLUE situation for the patient particularly given the vegetations in the heart and devastating consequences in this instance.  Patient will be DNR in the event of cardiac arrest however will continue aggressive management for his critical care issues otherwise.  Code status: DNR in the event of cardiac arrest however, we will continue current line of therapy.  Disposition: Continue current acute care  Time spent for the meeting: 25 minutes    C. Danice Goltz, MD Advanced Bronchoscopy P[facility]    *This note was dictated using voice recognition software/Dragon.  Despite best efforts to proofread, errors can occur which can change the meaning. Any transcriptional errors that result from this process are unintentional and may not be fully corrected at the time of dictation.  01/23/2022, 12:30 PM

## 2022-01-24 DIAGNOSIS — I5021 Acute systolic (congestive) heart failure: Secondary | ICD-10-CM | POA: Diagnosis present

## 2022-01-24 DIAGNOSIS — R4182 Altered mental status, unspecified: Secondary | ICD-10-CM

## 2022-01-24 DIAGNOSIS — I358 Other nonrheumatic aortic valve disorders: Secondary | ICD-10-CM | POA: Diagnosis present

## 2022-01-24 LAB — COMPREHENSIVE METABOLIC PANEL
ALT: 10 U/L (ref 0–44)
AST: 18 U/L (ref 15–41)
Albumin: 2.8 g/dL — ABNORMAL LOW (ref 3.5–5.0)
Alkaline Phosphatase: 55 U/L (ref 38–126)
Anion gap: 3 — ABNORMAL LOW (ref 5–15)
BUN: 15 mg/dL (ref 6–20)
CO2: 25 mmol/L (ref 22–32)
Calcium: 7.9 mg/dL — ABNORMAL LOW (ref 8.9–10.3)
Chloride: 113 mmol/L — ABNORMAL HIGH (ref 98–111)
Creatinine, Ser: 0.73 mg/dL (ref 0.61–1.24)
GFR, Estimated: >60 mL/min (ref >60)
Glucose, Bld: 175 mg/dL — ABNORMAL HIGH (ref 70–99)
Potassium: 3.1 mmol/L — ABNORMAL LOW (ref 3.5–5.1)
Sodium: 141 mmol/L (ref 135–145)
Total Bilirubin: 0.7 mg/dL (ref 0.3–1.2)
Total Protein: 6.4 g/dL — ABNORMAL LOW (ref 6.5–8.1)

## 2022-01-24 LAB — CBC
HCT: 27.5 % — ABNORMAL LOW (ref 39.0–52.0)
Hemoglobin: 8.8 g/dL — ABNORMAL LOW (ref 13.0–17.0)
MCH: 28 pg (ref 26.0–34.0)
MCHC: 32 g/dL (ref 30.0–36.0)
MCV: 87.6 fL (ref 80.0–100.0)
Platelets: 62 10*3/uL — ABNORMAL LOW (ref 150–400)
RBC: 3.14 MIL/uL — ABNORMAL LOW (ref 4.22–5.81)
RDW: 14.4 % (ref 11.5–15.5)
WBC: 8.4 10*3/uL (ref 4.0–10.5)
nRBC: 0 % (ref 0.0–0.2)

## 2022-01-24 LAB — CULTURE, BLOOD (ROUTINE X 2)

## 2022-01-24 LAB — GLUCOSE, CAPILLARY
Glucose-Capillary: 144 mg/dL — ABNORMAL HIGH (ref 70–99)
Glucose-Capillary: 146 mg/dL — ABNORMAL HIGH (ref 70–99)
Glucose-Capillary: 163 mg/dL — ABNORMAL HIGH (ref 70–99)
Glucose-Capillary: 170 mg/dL — ABNORMAL HIGH (ref 70–99)
Glucose-Capillary: 199 mg/dL — ABNORMAL HIGH (ref 70–99)
Glucose-Capillary: 287 mg/dL — ABNORMAL HIGH (ref 70–99)
Glucose-Capillary: 86 mg/dL (ref 70–99)

## 2022-01-24 LAB — T4, FREE: Free T4: 0.74 ng/dL (ref 0.61–1.12)

## 2022-01-24 LAB — MAGNESIUM: Magnesium: 2.2 mg/dL (ref 1.7–2.4)

## 2022-01-24 LAB — TSH: TSH: 2.07 u[IU]/mL (ref 0.350–4.500)

## 2022-01-24 LAB — PHOSPHORUS: Phosphorus: 2.6 mg/dL (ref 2.5–4.6)

## 2022-01-24 MED ORDER — FONDAPARINUX SODIUM 2.5 MG/0.5ML ~~LOC~~ SOLN
2.5000 mg | SUBCUTANEOUS | Status: DC
Start: 2022-01-25 — End: 2022-01-25

## 2022-01-24 MED ORDER — SODIUM CHLORIDE 0.9 % IV SOLN
250.0000 mL | INTRAVENOUS | Status: DC | PRN
  Administered 2022-01-27: 250 mL via INTRAVENOUS

## 2022-01-24 MED ORDER — SODIUM CHLORIDE 0.9% FLUSH: 3.0000 mL | INTRAVENOUS | Status: DC | PRN

## 2022-01-24 MED ORDER — IPRATROPIUM-ALBUTEROL 0.5-2.5 (3) MG/3ML IN SOLN
3.0000 mL | Freq: Three times a day (TID) | RESPIRATORY_TRACT | Status: DC
  Administered 2022-01-24 – 2022-01-25 (×4): 3 mL via RESPIRATORY_TRACT
  Filled 2022-01-24 (×4): qty 3

## 2022-01-24 MED ORDER — FONDAPARINUX SODIUM 7.5 MG/0.6ML ~~LOC~~ SOLN: 7.5000 mg | SUBCUTANEOUS | Status: DC

## 2022-01-24 MED ORDER — CIPROFLOXACIN IN D5W 400 MG/200ML IV SOLN
400.0000 mg | Freq: Two times a day (BID) | INTRAVENOUS | Status: AC
  Administered 2022-01-24 – 2022-02-07 (×29): 400 mg via INTRAVENOUS
  Filled 2022-01-24 (×30): qty 200

## 2022-01-24 MED ORDER — SODIUM CHLORIDE 0.9 % IV SOLN
2.0000 g | Freq: Three times a day (TID) | INTRAVENOUS | Status: AC
  Administered 2022-01-25 – 2022-02-07 (×42): 2 g via INTRAVENOUS
  Filled 2022-01-24 (×3): qty 12.5
  Filled 2022-01-24: qty 2
  Filled 2022-01-24: qty 12.5
  Filled 2022-01-24: qty 2
  Filled 2022-01-24: qty 12.5
  Filled 2022-01-24 (×2): qty 2
  Filled 2022-01-24: qty 12.5
  Filled 2022-01-24: qty 2
  Filled 2022-01-24 (×7): qty 12.5
  Filled 2022-01-24 (×3): qty 2
  Filled 2022-01-24 (×4): qty 12.5
  Filled 2022-01-24 (×2): qty 2
  Filled 2022-01-24 (×4): qty 12.5
  Filled 2022-01-24: qty 2
  Filled 2022-01-24: qty 12.5
  Filled 2022-01-24: qty 2
  Filled 2022-01-24: qty 12.5
  Filled 2022-01-24 (×2): qty 2
  Filled 2022-01-24 (×2): qty 12.5
  Filled 2022-01-24: qty 2
  Filled 2022-01-24 (×2): qty 12.5
  Filled 2022-01-24: qty 2

## 2022-01-24 MED ORDER — SODIUM CHLORIDE 0.9% FLUSH
3.0000 mL | Freq: Two times a day (BID) | INTRAVENOUS | Status: DC
  Administered 2022-01-24 – 2022-01-29 (×12): 3 mL via INTRAVENOUS

## 2022-01-24 MED ORDER — POTASSIUM CHLORIDE 10 MEQ/50ML IV SOLN
10.0000 meq | INTRAVENOUS | Status: AC
  Administered 2022-01-24 (×4): 10 meq via INTRAVENOUS
  Filled 2022-01-24 (×4): qty 50

## 2022-01-24 MED ORDER — POTASSIUM CHLORIDE 20 MEQ PO PACK
40.0000 meq | PACK | Freq: Once | ORAL | Status: AC
  Administered 2022-01-24: 40 meq
  Filled 2022-01-24: qty 2

## 2022-01-24 NOTE — Consult Note (Signed)
NAME: Earl Moreno  DOB: 02/14/1966  MRN: [id number]  Date/Time: 01/24/2022 5:39 PM  REQUESTING PROVIDER:jeremiah Elvina SidleKeene Subjective:  REASON FOR CONSULT: Serratia bacteremia ?No history from patient- he is intubated Earl Moreno is a 56 y.o. male with a history of IVDA , recurrent hospitalization and leaving AMA, recently on 01/15/22 for enterobacter bacteremia and Rt hip osteo  brought in by EMS because he was not doing well- He was combative in the Ed and had to receive ativan and haldol and had to be intubated in The ED and is in ICU  01/21/22  BP 106/67  Temp 100.5 F (38.1 C) !  Pulse Rate 101 !  Resp 18  SpO2 97 %    Latest Reference Range & Units 01/22/22  WBC 4.0 - 10.5 K/uL 14.0 (H)  Hemoglobin 13.0 - 17.0 g/dL 56.212.4 (L)  HCT 13.039.0 - 86.552.0 % 38.2 (L)  Platelets 150 - 400 K/uL 250  Creatinine 0.61 - 1.24 mg/dL 7.840.80   BC sent and it came positive for serratia CT abdomen questioned splenic infarct Rt pubic ramus erosion CT head Focal loss of gray-white matter differentiation within the right parietal cortex which may reflect a subacute cortical infarct, 2 d echo large aortic valve vegetation  I am seeing the patient for the same Past Medical History:  Diagnosis Date   Diabetes mellitus without complication [facility](HCC)     Past Surgical History:  Procedure Laterality Date   I & D EXTREMITY Left 06/05/2018   Procedure: IRRIGATION AND DEBRIDEMENT EXTREMITY-LEFT HAND;  Surgeon: Lyndle HerrlichBowers, James R, MD;  Location: [facility] ORS;  Service: Orthopedics;  Laterality: Left;    Social History   Socioeconomic History   Marital status: Married    Spouse name: Not on file   Number of children: Not on file   Years of education: Not on file   Highest education level: Not on file  Occupational History   Not on file  Tobacco Use   Smoking status: Every Day    Packs/day: 0.50    Types: Cigarettes   Smokeless tobacco: Never  Substance and Sexual Activity   Alcohol use: No   Drug use: Yes     Types: Cocaine, IV    Comment: Heroin   Sexual activity: Not on file    Comment: occasionally  Other Topics Concern   Not on file  Social History Narrative   Not on file   Social Determinants of Health   Financial Resource Strain: Not on file  Food Insecurity: Not on file  Transportation Needs: Not on file  Physical Activity: Not on file  Stress: Not on file  Social Connections: Not on file  Intimate Partner Violence: Not on file    Family History  Problem Relation Age of Onset   Diabetes Mother    Allergies  Allergen Reactions   Heparin     Undergoing HITT w/u follow pending PF4 & SRA result   I? Current Facility-Administered Medications  Medication Dose Route Frequency Provider Last Rate Last Admin   0.9 %  sodium chloride infusion   Intravenous PRN Salena SanerGonzalez, Carmen L, MD   Stopped at 01/23/22 0508   0.9 %  sodium chloride infusion  250 mL Intravenous PRN Raechel Chutegayli, Khabib, MD       acetaminophen (TYLENOL) tablet 650 mg  650 mg Per Tube Q4H PRN Rust-Chester, Britton L, NP   650 mg at 01/22/22 1013   [START ON 01/25/2022] ceFEPIme (MAXIPIME) 2 g in sodium chloride 0.9 %  100 mL IVPB  2 g Intravenous Q8H Chadwick Reiswig, Rhodia Albright, MD       Chlorhexidine Gluconate Cloth 2 % PADS 6 each  6 each Topical Daily Salena Saner, MD   6 each at 01/24/22 0942   ciprofloxacin (CIPRO) IVPB 400 mg  400 mg Intravenous Q12H Lynn Ito, MD 200 mL/hr at 01/24/22 1729 Infusion Verify at 01/24/22 1729   docusate (COLACE) 50 MG/5ML liquid 100 mg  100 mg Per Tube BID Rust-Chester, Micheline Rough L, NP   100 mg at 01/24/22 0939   docusate sodium (COLACE) capsule 100 mg  100 mg Oral BID PRN Rust-Chester, Cecelia Byars, NP       feeding supplement (VITAL AF 1.2 CAL) liquid 1,000 mL  1,000 mL Per Tube Continuous Salena Saner, MD   Stopped at 01/24/22 1435   fentaNYL in NS (2mcg/ml) infusion-PREMIX  0-200 mcg/hr Intravenous Continuous Rust-Chester, Britton L, NP 12.5 mL/hr at 01/24/22  1729 125 mcg/hr at 01/24/22 1729   [START ON 01/25/2022] fondaparinux (ARIXTRA) injection 2.5 mg  2.5 mg Subcutaneous Q24H Martyn Malay, RPH       free water 30 mL  30 mL Per Tube Q4H Salena Saner, MD   30 mL at 01/24/22 1511   insulin aspart (novoLOG) injection 0-15 Units  0-15 Units Subcutaneous Q4H Salena Saner, MD   2 Units at 01/24/22 1200   insulin glargine-yfgn (SEMGLEE) injection 10 Units  10 Units Subcutaneous QHS Salena Saner, MD   10 Units at 01/23/22 2148   ipratropium-albuterol (DUONEB) 0.5-2.5 (3) MG/3ML nebulizer solution 3 mL  3 mL Nebulization TID Raechel Chute, MD   3 mL at 01/24/22 1305   meropenem (MERREM) 1 g in sodium chloride 0.9 % 100 mL IVPB  1 g Intravenous Q8H Sherrilynn Gudgel, Rhodia Albright, MD   Stopped at 01/24/22 1505   midazolam (VERSED) injection 2 mg  2 mg Intravenous Q15 min PRN Rust-Chester, Micheline Rough L, NP       midazolam (VERSED) injection 2 mg  2 mg Intravenous Q2H PRN Rust-Chester, Cecelia Byars, NP       multivitamin liquid 15 mL  15 mL Per Tube Daily Salena Saner, MD   15 mL at 01/24/22 0939   Oral care mouth rinse  15 mL Mouth Rinse Q2H Salena Saner, MD   15 mL at 01/24/22 1712   Oral care mouth rinse  15 mL Mouth Rinse PRN Salena Saner, MD       pantoprazole (PROTONIX) 2 mg/mL oral suspension 40 mg  40 mg Per Tube Daily Rust-Chester, Britton L, NP   40 mg at 01/24/22 0939   polyethylene glycol (MIRALAX / GLYCOLAX) packet 17 g  17 g Per Tube Daily Rust-Chester, Britton L, NP   17 g at 01/24/22 0939   propofol (DIPRIVAN) 1000 MG/100ML infusion  5-50 mcg/kg/min Intravenous Continuous Rust-Chester, Britton L, NP 10.96 mL/hr at 01/24/22 1729 35 mcg/kg/min at 01/24/22 1729   sodium chloride flush (NS) 0.9 % injection 3 mL  3 mL Intravenous Q12H Dgayli, Lianne Bushy, MD   3 mL at 01/24/22 1206   sodium chloride flush (NS) 0.9 % injection 3 mL  3 mL Intravenous PRN Raechel Chute, MD       thiamine (VITAMIN B1) injection 100 mg  100 mg  Intravenous Daily Salena Saner, MD   100 mg at 01/24/22 0940     Abtx:  Anti-infectives (From admission, onward)    Start     Dose/Rate  Route Frequency Ordered Stop   01/25/22 0600  ceFEPIme (MAXIPIME) 2 g in sodium chloride 0.9 % 100 mL IVPB        2 g 200 mL/hr over 30 Minutes Intravenous Every 8 hours 01/24/22 1523     01/24/22 1800  ciprofloxacin (CIPRO) IVPB 400 mg        400 mg 200 mL/hr over 60 Minutes Intravenous Every 12 hours 01/24/22 1523     01/23/22 0600  vancomycin (VANCOREADY) IVPB 1250 mg/250 mL  Status:  Discontinued        1,250 mg 166.7 mL/hr over 90 Minutes Intravenous Every 24 hours 01/22/22 1921 01/23/22 1127   01/22/22 1415  meropenem (MERREM) 1 g in sodium chloride 0.9 % 100 mL IVPB        1 g 200 mL/hr over 30 Minutes Intravenous Every 8 hours 01/22/22 1318 01/24/22 2359   01/22/22 1330  ceFEPIme (MAXIPIME) 2 g in sodium chloride 0.9 % 100 mL IVPB  Status:  Discontinued        2 g 200 mL/hr over 30 Minutes Intravenous Every 8 hours 01/22/22 0543 01/22/22 1301   01/22/22 0600  vancomycin (VANCOREADY) IVPB 1250 mg/250 mL  Status:  Discontinued        1,250 mg 166.7 mL/hr over 90 Minutes Intravenous Every 24 hours 01/22/22 0547 01/22/22 1333   01/21/22 2230  vancomycin (VANCOREADY) IVPB 1250 mg/250 mL  Status:  Discontinued        1,250 mg 166.7 mL/hr over 90 Minutes Intravenous  Once 01/21/22 2226 01/22/22 0547   01/21/22 2215  ceFEPIme (MAXIPIME) 2 g in sodium chloride 0.9 % 100 mL IVPB        2 g 200 mL/hr over 30 Minutes Intravenous  Once 01/21/22 2211 01/22/22 0641   01/21/22 2215  metroNIDAZOLE (FLAGYL) IVPB 500 mg        500 mg 100 mL/hr over 60 Minutes Intravenous  Once 01/21/22 2211 01/22/22 0921   01/21/22 2215  vancomycin (VANCOCIN) IVPB 1000 mg/200 mL premix  Status:  Discontinued        1,000 mg 200 mL/hr over 60 Minutes Intravenous  Once 01/21/22 2211 01/21/22 2225       REVIEW OF SYSTEMS:  NA Objective:  VITALS:  BP 106/73    Pulse 100   Temp 98.5 F (36.9 C) (Oral)   Resp (!) 26   SpO2 100%  LDA Left IJ Intubated foley PHYSICAL EXAM:  General: intubated and sedated Head: Normocephalic, without obvious abnormality, atraumatic. Eyes: Conjunctivae clear, anicteric sclerae. Pupils are equal ENT cannot examine Neck:  symmetrical, no adenopathy, thyroid: non tender  Lungs: b/l air entry Basal crepts Heart: tachycardia Abdomen: Soft, non-tender,not distended. Bowel sounds normal. No masses Extremities: atraumatic, no cyanosis. No edema. No clubbing Skin: No rashes or lesions. Or bruising Lymph: Cervical, supraclavicular normal. Neurologic: cannot assess Pertinent Labs Lab Results CBC    Component Value Date/Time   WBC 8.4 01/24/2022 0321   RBC 3.14 (L) 01/24/2022 0321   HGB 8.8 (L) 01/24/2022 0321   HGB 14.7 01/20/2013 0338   HCT 27.5 (L) 01/24/2022 0321   HCT 42.0 01/20/2013 0338   PLT 62 (L) 01/24/2022 0321   PLT 180 01/20/2013 0338   MCV 87.6 01/24/2022 0321   MCV 93 01/20/2013 0338   MCH 28.0 01/24/2022 0321   MCHC 32.0 01/24/2022 0321   RDW 14.4 01/24/2022 0321   RDW 12.8 01/20/2013 0338   LYMPHSABS 0.7 01/22/2022 0255   LYMPHSABS 2.2 01/20/2013  0338   MONOABS 0.2 01/22/2022 0255   MONOABS 0.9 01/20/2013 0338   EOSABS 0.0 01/22/2022 0255   EOSABS 0.4 01/20/2013 0338   BASOSABS 0.0 01/22/2022 0255   BASOSABS 0.1 01/20/2013 0338       Latest Ref Rng & Units 01/24/2022    3:21 AM 01/23/2022    5:19 AM 01/22/2022    8:44 PM  CMP  Glucose 70 - 99 mg/dL 037  048    BUN 6 - 20 mg/dL 15  11    Creatinine 8.89 - 1.24 mg/dL 1.69  4.50    Sodium 388 - 145 mmol/L 141  138  135   Potassium 3.5 - 5.1 mmol/L 3.1  3.2  2.3   Chloride 98 - 111 mmol/L 113  107    CO2 22 - 32 mmol/L 25  24    Calcium 8.9 - 10.3 mg/dL 7.9  7.4    Total Protein 6.5 - 8.1 g/dL 6.4     Total Bilirubin 0.3 - 1.2 mg/dL 0.7     Alkaline Phos 38 - 126 U/L 55     AST 15 - 41 U/L 18     ALT 0 - 44 U/L 10          Microbiology: Recent Results (from the past 240 hour(s))  Culture, blood (routine x 2)     Status: None   Collection Time: 01/15/22 12:02 AM   Specimen: BLOOD  Result Value Ref Range Status   Specimen Description BLOOD BLOOD RIGHT WRIST  Final   Special Requests   Final    BOTTLES DRAWN AEROBIC AND ANAEROBIC Blood Culture adequate volume   Culture   Final    NO GROWTH 5 DAYS Performed at [facility], [address], [place], [place] [address]    Report Status 01/20/2022 FINAL  Final  Urine Culture     Status: None   Collection Time: 01/15/22 12:03 AM   Specimen: Urine, Clean Catch  Result Value Ref Range Status   Specimen Description   Final    URINE, CLEAN CATCH Performed at [facility], [address] [place], [place] [address]    Special Requests   Final    NONE Performed at [facility], [address] [place], [place] [address]    Culture   Final    NO GROWTH Performed at [facility], [address] [place], [place] [address]    Report Status 01/16/2022 FINAL  Final  Culture, blood (routine x 2)     Status: None   Collection Time: 01/15/22 12:53 AM   Specimen: BLOOD  Result Value Ref Range Status   Specimen Description BLOOD LEFT ANTECUBITAL  Final   Special Requests   Final    BOTTLES DRAWN AEROBIC AND ANAEROBIC Blood Culture adequate volume   Culture   Final    NO GROWTH 5 DAYS Performed at [facility], [address] [place], [place] [address]    Report Status 01/20/2022 FINAL  Final  Resp Panel by RT-PCR (Flu A&B, Covid) Urine, Clean Catch     Status: None   Collection Time: 01/22/22  1:50 AM   Specimen: Urine, Clean Catch; Nasal Swab  Result Value Ref Range Status   SARS Coronavirus 2 by RT PCR NEGATIVE NEGATIVE Final    Comment: (NOTE) SARS-CoV-2 target nucleic acids are NOT DETECTED.  The SARS-CoV-2 RNA is generally detectable in upper respiratory specimens during the acute phase of  infection. The  lowest concentration of SARS-CoV-2 viral copies this assay can detect is 138 copies/mL. A negative result does not preclude SARS-Cov-2 infection and should not be used as the sole basis for treatment or other patient management decisions. A negative result may occur with  improper specimen collection/handling, submission of specimen other than nasopharyngeal swab, presence of viral mutation(s) within the areas targeted by this assay, and inadequate number of viral copies(<138 copies/mL). A negative result must be combined with clinical observations, patient history, and epidemiological information. The expected result is Negative.  Fact Sheet for Patients:  BloggerCourse.com  Fact Sheet for Healthcare Providers:  SeriousBroker.it  This test is no t yet approved or cleared by the [place] FDA and  has been authorized for detection and/or diagnosis of SARS-CoV-2 by FDA under an Emergency Use Authorization (EUA). This EUA will remain  in effect (meaning this test can be used) for the duration of the COVID-19 declaration under Section 564(b)(1) of the Act, 21 U.S.C.section 360bbb-3(b)(1), unless the authorization is terminated  or revoked sooner.       Influenza A by PCR NEGATIVE NEGATIVE Final   Influenza B by PCR NEGATIVE NEGATIVE Final    Comment: (NOTE) The Xpert Xpress SARS-CoV-2/FLU/RSV plus assay is intended as an aid in the diagnosis of influenza from Nasopharyngeal swab specimens and should not be used as a sole basis for treatment. Nasal washings and aspirates are unacceptable for Xpert Xpress SARS-CoV-2/FLU/RSV testing.  Fact Sheet for Patients: BloggerCourse.com  Fact Sheet for Healthcare Providers: SeriousBroker.it  This test is not yet approved or cleared by the [place] FDA and has been authorized for detection and/or diagnosis of SARS-CoV-2  by FDA under an Emergency Use Authorization (EUA). This EUA will remain in effect (meaning this test can be used) for the duration of the COVID-19 declaration under Section 564(b)(1) of the Act, 21 U.S.C. section 360bbb-3(b)(1), unless the authorization is terminated or revoked.  Performed at [facility], [address], [place], [place] [address]   Urine Culture     Status: Abnormal   Collection Time: 01/22/22  1:50 AM   Specimen: In/Out Cath Urine  Result Value Ref Range Status   Specimen Description   Final    IN/OUT CATH URINE Performed at [facility], [address], [place], [place] [address]    Special Requests   Final    NONE Performed at [facility], [address] [place], [place] [address]    Culture MULTIPLE SPECIES PRESENT, SUGGEST RECOLLECTION (A)  Final   Report Status 01/23/2022 FINAL  Final  Blood Culture (routine x 2)     Status: Abnormal   Collection Time: 01/22/22  2:55 AM   Specimen: BLOOD  Result Value Ref Range Status   Specimen Description   Final    BLOOD LEFT ANTECUBITAL Performed at [facility], [address], [place], [place] [address]    Special Requests   Final    BOTTLES DRAWN AEROBIC AND ANAEROBIC Blood Culture results may not be optimal due to an inadequate volume of blood received in culture bottles Performed at [facility], [address], [place], [place] [address]    Culture  Setup Time   Final    GRAM NEGATIVE RODS IN BOTH AEROBIC AND ANAEROBIC BOTTLES CRITICAL RESULT CALLED TO, READ BACK BY AND VERIFIED WITH: RACHEL RODRIGUEZ GUZMAN 01/22/22 @ 1248 BY SB    Culture SERRATIA MARCESCENS (A)  Final   Report Status 01/24/2022 FINAL  Final  Organism ID, Bacteria SERRATIA MARCESCENS  Final      Susceptibility   Serratia marcescens - MIC*    CEFAZOLIN >=64 RESISTANT Resistant     CEFEPIME <=0.12 SENSITIVE Sensitive     CEFTAZIDIME <=1 SENSITIVE Sensitive     CEFTRIAXONE <=0.25  SENSITIVE Sensitive     CIPROFLOXACIN <=0.25 SENSITIVE Sensitive     GENTAMICIN <=1 SENSITIVE Sensitive     TRIMETH/SULFA <=20 SENSITIVE Sensitive     * SERRATIA MARCESCENS  Blood Culture ID Panel (Reflexed)     Status: Abnormal   Collection Time: 01/22/22  2:55 AM  Result Value Ref Range Status   Enterococcus faecalis NOT DETECTED NOT DETECTED Final   Enterococcus Faecium NOT DETECTED NOT DETECTED Final   Listeria monocytogenes NOT DETECTED NOT DETECTED Final   Staphylococcus species NOT DETECTED NOT DETECTED Final   Staphylococcus aureus (BCID) NOT DETECTED NOT DETECTED Final   Staphylococcus epidermidis NOT DETECTED NOT DETECTED Final   Staphylococcus lugdunensis NOT DETECTED NOT DETECTED Final   Streptococcus species NOT DETECTED NOT DETECTED Final   Streptococcus agalactiae NOT DETECTED NOT DETECTED Final   Streptococcus pneumoniae NOT DETECTED NOT DETECTED Final   Streptococcus pyogenes NOT DETECTED NOT DETECTED Final   A.calcoaceticus-baumannii NOT DETECTED NOT DETECTED Final   Bacteroides fragilis NOT DETECTED NOT DETECTED Final   Enterobacterales DETECTED (A) NOT DETECTED Final    Comment: Enterobacterales represent a large order of gram negative bacteria, not a single organism. CRITICAL RESULT CALLED TO, READ BACK BY AND VERIFIED WITH: RACHEL RODRIGUES GUZMAN 01/22/22@ 1248 BY SB    Enterobacter cloacae complex NOT DETECTED NOT DETECTED Final   Escherichia coli NOT DETECTED NOT DETECTED Final   Klebsiella aerogenes NOT DETECTED NOT DETECTED Final   Klebsiella oxytoca NOT DETECTED NOT DETECTED Final   Klebsiella pneumoniae NOT DETECTED NOT DETECTED Final   Proteus species NOT DETECTED NOT DETECTED Final   Salmonella species NOT DETECTED NOT DETECTED Final   Serratia marcescens DETECTED (A) NOT DETECTED Corrected    Comment: CRITICAL RESULT CALLED TO, READ BACK BY AND VERIFIED WITH: RACHEL RODRIGUES GUZMAN 01/22/22@ 1248 BY SB CORRECTED ON 09/04 AT 0420: PREVIOUSLY REPORTED AS  DETECTED RBV RACHEL RODRIGUES GUZMAN 01/22/22@ 1248 BY SB    Haemophilus influenzae NOT DETECTED NOT DETECTED Final   Neisseria meningitidis NOT DETECTED NOT DETECTED Final   Pseudomonas aeruginosa NOT DETECTED NOT DETECTED Final   Stenotrophomonas maltophilia NOT DETECTED NOT DETECTED Final   Candida albicans NOT DETECTED NOT DETECTED Final   Candida auris NOT DETECTED NOT DETECTED Final   Candida glabrata NOT DETECTED NOT DETECTED Final   Candida krusei NOT DETECTED NOT DETECTED Final   Candida parapsilosis NOT DETECTED NOT DETECTED Final   Candida tropicalis NOT DETECTED NOT DETECTED Final   Cryptococcus neoformans/gattii NOT DETECTED NOT DETECTED Final   CTX-M ESBL NOT DETECTED NOT DETECTED Final   Carbapenem resistance IMP NOT DETECTED NOT DETECTED Final   Carbapenem resistance KPC NOT DETECTED NOT DETECTED Final   Carbapenem resistance NDM NOT DETECTED NOT DETECTED Final   Carbapenem resist OXA 48 LIKE NOT DETECTED NOT DETECTED Final   Carbapenem resistance VIM NOT DETECTED NOT DETECTED Final    Comment: Performed at [facility], [address] [place], [place] [address]  Blood Culture (routine x 2)     Status: Abnormal   Collection Time: 01/22/22  3:40 AM   Specimen: BLOOD  Result Value Ref Range Status   Specimen Description   Final    BLOOD BLOOD LEFT  ARM Performed at [facility], [address] [place], [place] [address]    Special Requests   Final    BOTTLES DRAWN AEROBIC AND ANAEROBIC Blood Culture results may not be optimal due to an excessive volume of blood received in culture bottles Performed at [facility], [address] [place], [place] [address]    Culture  Setup Time   Final    GRAM NEGATIVE RODS IN BOTH AEROBIC AND ANAEROBIC BOTTLES CRITICAL RESULT CALLED TO, READ BACK BY AND VERIFIED WITH: RACHEL RODRIGUEZ GUZMAN 01/22/22 @ 1245 BY SB Performed at [facility], [address] [place], [place] [address]    Culture  (A)  Final    SERRATIA MARCESCENS SUSCEPTIBILITIES PERFORMED ON PREVIOUS CULTURE WITHIN THE LAST 5 DAYS. Performed at [facility], [address], [place], [place] [address]    Report Status 01/24/2022 FINAL  Final  MRSA Next Gen by PCR, Nasal     Status: None   Collection Time: 01/22/22  8:44 PM   Specimen: Nasal Mucosa; Nasal Swab  Result Value Ref Range Status   MRSA by PCR Next Gen NOT DETECTED NOT DETECTED Final    Comment: (NOTE) The GeneXpert MRSA Assay (FDA approved for NASAL specimens only), is one component of a comprehensive MRSA colonization surveillance program. It is not intended to diagnose MRSA infection nor to guide or monitor treatment for MRSA infections. Test performance is not FDA approved in patients less than 24 years old. Performed at [facility], [address] [place], [place] [address]     IMAGING RESULTS: Echo- aortic valve vegetation  CT head - rt parietal cortex subacute cortical infarct  I have personally reviewed the films ? Impression/Recommendation Encephalopathy secondary to sepsis and infarct ? Acute hypoxic resp failure- intubated  Serratia bacteremia with aortic valve endocarditis Will do dual coverage with cefepime + cipro IVDA  Osteomyelitis rt pubic ramus Previous bacteremias including serratia , enterobacter- not treated because of him leaving AMA ? _Anemia  thrmobocytopenia__________________________________________________ Discussed with care team Note:  This document was prepared using Dragon voice recognition software and may include unintentional dictation errors.

## 2022-01-24 NOTE — Progress Notes (Signed)
Patient's son Earl Moreno called for an update. Update provided by this RN after verifying password with Earl Moreno. Updated Earl Moreno that a SBT would be performed sometime today at discretion of RT availability. Family would like to be called when SBT occurs so they can come to bedside. Educated Earl Moreno and family member in background asking questions about how a SBT occurs. Informed them the tube is usually not removed at this time. This RN did not want to convey to family that what they deem "full life support" is the ventilator being removed. Family verbalized after education that they understood removing the ventilator was not terminating care for the patient. I do believe further education for the family needs to be provided to ensure their understanding of the care the patient is receiving. I reinforced we are NOT stopping any patient care, he will receive full scope of care as currently order by MD.

## 2022-01-24 NOTE — Progress Notes (Addendum)
NAME:  Earl Moreno, MRN:  [id number], DOB:  28-Mar-1966, LOS: 2 ADMISSION DATE:  01/21/2022, INITIAL CONSULTATION DATE:  01/22/22 REFERRING MD:  Dr. Leonides Schanz, CHIEF COMPLAINT:  Altered Mental Status  History of Present Illness:  56 yo M presenting to [facility] ED on 01/21/22 via EMS complaining that he does not feel well. History provided by ED documentation as there is no family available and patient intubated and sedated. EMS reported that they think he has a son who called 911.  Per chart review patient with multiple admissions for sepsis including pyelonephritis, GN bacteremia & suspected osteomyelitis in his right hip as recently as 01/15/22. There have been multiple attempts to treat with IV antibiotics & perform a TEE and rule out endocarditis but the patient continues to leave AMA.  ED course: Upon arrival patient combative asking for help but then telling staff to leave I'm alone. Patient febrile & tachycardic. Sepsis protocol initiated but hindered by the fact that the patient was so uncooperative. Patient received Ativan and Haldol in order to restrain him enough to place a PIV. Lab work significant for lactic acidosis, leukocytosis with a left shift & an elevated PCT. Mild hyponatremia, hyperglycemia & an elevated troponin. UDS positive for cocaine & opiate. CT head concerning for possible subacute cortical infarct. Precedex drip was started, but even at max the patient still thrashing in the bed. Medications given: acetaminophen, cefepime & vancomycin, Geodon, ativan, haldol, etomidate & rocuronium, 2 L of LR Initial Vitals: 100.5, 20, 111, 116/68 & spO2 98 % on RA Significant labs: (Labs/ Imaging personally reviewed) EKG Interpretation: Date: 01/22/22, EKG Time: 02:30, Rate: 120, Rhythm: ST, QRS Axis:  LAD, Intervals: normal, ST/T Wave abnormalities: none, Narrative Interpretation: ST Chemistry: Na+: 129 (when corrected for glucose- 134, K+: 3.5, BUN/Cr.: 9/0.80, Serum CO2/ AG: 24/ 15 Hematology:  WBC: 14.0, Hgb: 12.4,  Troponin: 89, CK: 207, Lactic/ PCT: 5.1/ 2.98, COVID-19 & Influenza A/B: negative UDS: +cocaine & +opiates ABG: 7.55/ 30/ 157/ 26.2  CXR 01/22/22: no acute cardiopulmonary abnormalities CT head wo contrast 01/22/22: Focal loss of gray-white matter differentiation within the right parietal cortex which may reflect a subacute cortical infarct, though this is not well characterized on this examination. No associated mass effect or midline shift. Contrast enhanced MRI examination is recommended for further evaluation CT chest/abdomen/pelvis w contrast 01/22/22: No acute or inflammatory process identified in the chest. Satisfactory endotracheal and enteric tubes. Abdomen and pelvis remarkable for:- abnormal but nonspecific small to moderate volume of simple appearing free fluid in the pelvis. Paucity of intra-abdominal fat limits bowel detail, but no bowel obstruction or inflammation is evident.- small hypoenhancing areas in the superior spleen, with underlying borderline splenomegaly. Although nonspecific consider small splenic infarcts or hematogenous infection in this setting.- focal bone erosion of the posteroinferior right pubic ramus since last July. But no other lytic osseous lesion identified, and no regional inflammation. This would be an unusual site for osteomyelitis. Query a metabolic (such as parathyroid) bone disorder. Metastatic disease also not excluded.  Pertinent  Medical History  Heroin/cocaine abuse T2DM Splenic infarct GN sepsis Hyponatremia Homeless  Significant Hospital Events: Including procedures, antibiotic start and stop dates in addition to other pertinent events   01/22/22: admit to ICU after intubation and mechanical ventilation due to AMS and sedative administration as well as severe sepsis s/t suspected osteomyelitis 01/22/22: Echocardiogram confirms large aortic valve vegetation, patient was septic shock being stabilized, MRI brain with small areas of  diffusion abnormality right greater than  left parieto-occipital regions consistent with evolving subacute ischemic infarcts, short interval follow-up MRI suggested.  MR pelvis osteomyelitis the site of the right hamstring tendon origin possible myositis, bilateral iliopsoas bursitis 01/23/22: Discussed with cardiology for potential TEE however, the recommendation was for transfer to [facility] for TEE and cardiothoracic surgery evaluation.  01/24/22: HITT antibodies sent  Interim History / Subjective:  Patient intubated and sedated. Off pressors.  Adequately volume resuscitated.   Objective   Blood pressure 97/70, pulse 97, temperature 98.3 F (36.8 C), temperature source Oral, resp. rate (!) 25, SpO2 100 %.    Vent Mode: PRVC FiO2 (%):  [28 %] 28 % Set Rate:  [10 bmp] 10 bmp Vt Set:  [470 mL-500 mL] 470 mL PEEP:  [5 cmH20] 5 cmH20 Plateau Pressure:  [14 cmH20] 14 cmH20   Intake/Output Summary (Last 24 hours) at 01/24/2022 0854 Last data filed at 01/24/2022 1884 Gross per 24 hour  Intake 3404.67 ml  Output 2100 ml  Net 1304.67 ml    Examination:  Physical Exam Constitutional:      General: He is not in acute distress.    Appearance: He is ill-appearing.  HENT:     Head: Normocephalic.     Mouth/Throat:     Mouth: Mucous membranes are moist.     Comments: ETT in place Eyes:     Conjunctiva/sclera: Conjunctivae normal.     Pupils: Pupils are equal, round, and reactive to light.  Cardiovascular:     Rate and Rhythm: Normal rate and regular rhythm.     Pulses: Normal pulses.     Heart sounds: Murmur heard.  Pulmonary:     Effort: Pulmonary effort is normal.     Breath sounds: Normal breath sounds. No wheezing.     Comments: Ventilated breath sounds Abdominal:     General: Abdomen is flat.     Palpations: Abdomen is soft.  Genitourinary:    Comments: Foley in place Musculoskeletal:     Cervical back: Normal range of motion and neck supple.  Neurological:     Mental Status:  He is disoriented.     Comments: Sedated. Opens eyes to tactile stimuli but does not follow commands.     Resolved Hospital Problem list     Assessment & Plan:  Severe Sepsis with septic shock due to osteomyelitis in the RIGHT hip/ aortic valve endocarditis Initial interventions/workup included volume resuscitation and IV antibiotics. Blood cultures have since grown Serratia Marcescens. Risks for surgery outweigh benefits per surgeons at [facility]. Will continue supportive therapy. Currently on Meropenem. Will consult ID.  - ID consult - Patient intubated, mechanically ventilated - Daily CBC, monitor WBC/ fever curve - IV antibiotics: Meropenem - MRI pelvis shows osteomyelitis - Strict I/O's: alert provider if UOP < 0.5 mL/kg/hr - will need TEE and possible cardiothoracic evaluation - [place] critical care and cardiothoracic team consulted, not a candidate for surgery.  Aortic valve endocarditis Serratia marcecens bacteremia  Cardiothoracic surgery consulted (documented by Dr. Patsey Berthold 9/4) and conversation had regarding possibility of AVR. Patient very high risk and risks deemed to outweigh the benefits of AVR. Declined as a candidate for surgery. Will continue supportive management with antibiotics and repeat echocardiography.  - Continue Meropenem, ID consult appreciated - Ischemic infarcts, osteomyelitis - Likely benefit from a TEE to evaluate for aortic root abscess in the future, unclear if will change direct management in re to surgery.  Thrombocytopenia  Precipitous drop in platelet count. Previous exposure to heparin products.  High 4T score and high risk for HITT. Will initiate fondaparinux and send HITT panel. SRA if HITT ab positive.  Intubation and mechanical ventilation for airway protection secondary to encephalopathy and need for sedating medications Vent Mode: PRVC FiO2 (%):  [28 %] 28 % Set Rate:  [10 bmp] 10 bmp Vt Set:  [470 mL] 470 mL PEEP:  [5 cmH20] 5  cmH20 Plateau Pressure:  [14 cmH20] 14 cmH20  Continue ventilator support & lung protective strategies. Wean settings as tolerated and SBT daily.  - Wean PEEP & FiO2 as tolerated, maintain SpO2 > 90% - Head of bed elevated 30 degrees, VAP protocol in place - Plateau pressures less than 30 cm H20  - Intermittent chest x-ray & ABG PRN - Daily WUA with SBT as tolerated  - Ensure adequate pulmonary hygiene  - PAD protocol in place: continue Fentanyl drip & Propofol drip  Uncontrolled Type 2 Diabetes Mellitus Hemoglobin A1C: 13.5 (11/2021) - Monitor CBG Q 4 hour - basal bolus regimen - SSI moderate dosing, will start glargine insulin 10 units nightly + sliding scale - target range while in ICU: 140-180 - Diabetes Coordinator following, appreciate recommendations  Acute Encephalopathy suspect multifocal in the setting of cocaine & opiate substance abuse and severe sepsis septic emboli - neuro checks Q 4 - supportive care - MRI brain consistent with evolving subacute ischemic infarcts  Elevated Troponin secondary to demand ischemia - Mild trop elevation in the setting of endocarditis and HFrEF - continuous cardiac monitoring - will need TEE given endocarditis to r/o root abscess. Will not change surgical risk  Hypokalemia - Due to volume resuscitation/malnutrition - Replete potassium  Severe protein calorie malnutrition - Poor nutritional status in the setting of substance abuse, chronic illness - Initiate tube feeds - Multivitamin supplementation  Systems based checklist:  Neuro: septic emboli to brain. Toxic metabolic encephalopathy. Propofol/Fentanyl for analgosedation. Titrate for RASS -1 Cardiovascular: Aortic valve abscess with HFrEF secondary to AI and septic emboli. High risk for surgery, deemed poor surgical candidate by CT surgery. Unfortunately poor prognosis. Continue with supportive care. Pulmonary: Minimal vent settings. Daily SBT, extubate when able GI: tube feeds  and GI prophylaxis. Monitor LFTs Endo: Basal bolus, goal glucose 140-180 Renal: Kidney function at baseline. Replete electrolytes PRN Hem/Onc: HITT panel sent, fondaparinux for prophylaxis ID: ID consult for serratia bacteremia and endocarditis. Continue Meropenem  Best Practice (right click and "Reselect all SmartList Selections" daily)  Diet/type: tubefeeds DVT prophylaxis: other (fondaparinox) GI prophylaxis: PPI Lines: N/A Foley:  Yes, and it is still needed Code Status:  DNR Last date of multidisciplinary goals of care discussion [9/5]   Labs   CBC: Recent Labs  Lab 01/22/22 0255 01/23/22 0519 01/24/22 0321  WBC 14.0* 16.0* 8.4  NEUTROABS 12.9*  --   --   HGB 12.4* 8.8* 8.8*  HCT 38.2* 26.6* 27.5*  MCV 87.2 85.8 87.6  PLT 250 106* 62*     Basic Metabolic Panel: Recent Labs  Lab 01/22/22 0255 01/22/22 0520 01/22/22 2044 01/23/22 0519 01/24/22 0321  NA 129*  --  135 138 141  K 3.5  --  2.3* 3.2* 3.1*  CL 90*  --   --  107 113*  CO2 24  --   --  24 25  GLUCOSE 304*  --   --  111* 175*  BUN 9  --   --  11 15  CREATININE 0.80  --   --  0.66 0.73  CALCIUM 8.4*  --   --  7.4* 7.9*  MG  --  1.7 1.9 1.8 2.2  PHOS  --  2.1* 3.2 2.0* 2.6    GFR: Estimated Creatinine Clearance: 75.2 mL/min (by C-G formula based on SCr of 0.73 mg/dL). Recent Labs  Lab 01/22/22 0255 01/22/22 0855 01/22/22 2044 01/23/22 0519 01/24/22 0321  PROCALCITON 2.98  --   --   --   --   WBC 14.0*  --   --  16.0* 8.4  LATICACIDVEN 5.1* 4.9* 1.8  --   --      Liver Function Tests: Recent Labs  Lab 01/22/22 0255 01/23/22 0519 01/24/22 0321  AST 33  --  18  ALT 14  --  10  ALKPHOS 117  --  55  BILITOT 1.1  --  0.7  PROT 9.0*  --  6.4*  ALBUMIN 2.8* 2.3* 2.8*    No results for input(s): "LIPASE", "AMYLASE" in the last 168 hours. Recent Labs  Lab 01/22/22 0255  AMMONIA 16     ABG    Component Value Date/Time   PHART 7.5 (H) 01/23/2022 1040   PCO2ART 31 (L) 01/23/2022  1040   PO2ART 100 01/23/2022 1040   HCO3 24.2 01/23/2022 1040   O2SAT 99.8 01/23/2022 1040     Coagulation Profile: Recent Labs  Lab 01/22/22 0255  INR 1.3*     Cardiac Enzymes: Recent Labs  Lab 01/22/22 0255 01/23/22 0519  CKTOTAL 207 47*     HbA1C: Hgb A1c MFr Bld  Date/Time Value Ref Range Status  11/29/2021 02:02 PM 13.2 (H) 4.8 - 5.6 % Final    Comment:    (NOTE) Pre diabetes:          5.7%-6.4%  Diabetes:              >6.4%  Glycemic control for   <7.0% adults with diabetes   10/19/2020 04:44 PM 14.4 (H) 4.8 - 5.6 % Final    Comment:    (NOTE)         Prediabetes: 5.7 - 6.4         Diabetes: >6.4         Glycemic control for adults with diabetes: <7.0     CBG: Recent Labs  Lab 01/23/22 1635 01/23/22 1949 01/23/22 2356 01/24/22 0332 01/24/22 0753  GLUCAP 115* 161* 178* 163* 170*     Review of Systems:   UTA- patient intubated and sedated  Allergies No Known Allergies   Home Medications  Prior to Admission medications   Medication Sig Start Date End Date Taking? Authorizing Provider  amLODipine (NORVASC) 10 MG tablet Take 1 tablet (10 mg total) by mouth daily. 12/02/21   Barb Merino, MD  Baclofen 5 MG TABS Take 5 mg by mouth 3 (three) times daily as needed for muscle spasms. 12/02/21   Barb Merino, MD  Blood Glucose Monitoring Suppl (BLOOD GLUCOSE MONITOR SYSTEM) w/Device KIT USE UP TO 4 TIMES DAILY AS DIRECTED. 02/26/21   [name], MD  Blood Glucose Monitoring Suppl (RIGHTEST [id number] BLOOD GLUCOSE) w/Device KIT Use as directed to test blood sugar up to 4 times daily 12/02/21   Barb Merino, MD  glucose blood (RIGHTEST [id number] BLOOD GLUCOSE) test strip Use as directed to check blood sugar up to 4 times daily 12/02/21   Barb Merino, MD  Insulin Lispro Prot & Lispro (HUMALOG MIX 75/25 KWIKPEN) (75-25) 100 UNIT/ML Kwikpen Inject 18 Units into the skin 2 (two) times daily. 12/02/21   Barb Merino, MD  Insulin  Pen Needle (COMFORT EZ  PEN NEEDLES) 32G X 4 MM MISC USE AS DIRECTED TWICE DAILY (MORNING AND BEDTIME) 12/02/21   Barb Merino, MD  levofloxacin (LEVAQUIN) 750 MG tablet Take 750 mg by mouth daily. 01/12/22 01/26/22  [provider]  Rightest GL300 Lancets MISC USE AS DIRECTED UP TO 4 TIMES DAILY. 12/02/21   Barb Merino, MD    Scheduled Meds:  Chlorhexidine Gluconate Cloth  6 each Topical Daily   docusate  100 mg Per Tube BID   enoxaparin (LOVENOX) injection  40 mg Subcutaneous Q24H   free water  30 mL Per Tube Q4H   insulin aspart  0-15 Units Subcutaneous Q4H   insulin glargine-yfgn  10 Units Subcutaneous QHS   ipratropium-albuterol  3 mL Nebulization QID   multivitamin  15 mL Per Tube Daily   mouth rinse  15 mL Mouth Rinse Q2H   pantoprazole  40 mg Per Tube Daily   polyethylene glycol  17 g Per Tube Daily   potassium chloride  40 mEq Per Tube Once   thiamine (VITAMIN B1) injection  100 mg Intravenous Daily   Continuous Infusions:  sodium chloride Stopped (01/23/22 0508)   feeding supplement (VITAL AF 1.2 CAL) 35 mL/hr at 01/23/22 2300   fentaNYL infusion INTRAVENOUS 75 mcg/hr (01/24/22 0745)   meropenem (MERREM) IV 1 g (01/24/22 0618)   phenylephrine (NEO-SYNEPHRINE) Adult infusion Stopped (01/23/22 2313)   potassium chloride 10 mEq (01/24/22 0742)   propofol (DIPRIVAN) infusion 50 mcg/kg/min (01/24/22 0617)   PRN Meds:.sodium chloride, acetaminophen, docusate sodium, midazolam, midazolam, mouth rinse   Critical care time: 54 minutes    The patient is critically ill with multiple organ systems failure and requires high complexity decision making for assessment and support, frequent evaluation and titration of therapies, application of advanced monitoring technologies and extensive interpretation of multiple databases.   Overall prognosis is exceedingly poor.  Mortality from Serratia endocarditis as high as 85%.  Continue supportive care.

## 2022-01-24 NOTE — Progress Notes (Signed)
PHARMACY CONSULT NOTE  Pharmacy Consult for Electrolyte Monitoring and Replacement   Recent Labs: Potassium (mmol/L)  Date Value  01/24/2022 3.1 (L)  01/19/2013 3.6   Magnesium (mg/dL)  Date Value  23/53/6144 2.2   Calcium (mg/dL)  Date Value  31/54/0086 7.9 (L)   Calcium, Total (mg/dL)  Date Value  76/19/5093 8.7   Albumin (g/dL)  Date Value  26/71/2458 2.8 (L)  12/30/2012 3.9   Phosphorus (mg/dL)  Date Value  09/98/3382 2.6   Sodium (mmol/L)  Date Value  01/24/2022 141  01/19/2013 140     Assessment: 56 y.o. male who complains of right-sided low back pain dysuria and frequency.  Per chart review patient with multiple admissions for sepsis including pyelonephritis, GN bacteremia & suspected osteomyelitis in his right hip as recently as 01/15/22. There have been multiple attempts to treat with IV antibiotics & perform a TEE and rule out endocarditis but the patient continues to leave AMA. Pharmacy is asked to follow and replace electrolytes while in CCU  Nutrition: FWF 30 q4hs, TF @55ml /h (~1353ml/d), NS KVO  Goal of Therapy:  Electrolytes WNL  Plan:  Scr stable; Net +5.5L K: 2.3>3.2>3.1: after receiving KCL 80m IV q1h x4, KCL PO & from [facility] on 9/04.  Repleting with KCL 11/04 PO X1 & KCL IV q1h x4 doses. Mg 1.8>2.2 - after receiving MgSO 2g IV x1 on 9/04. WNL, no further repletion at this time.  Phos 2.0>2.6: after receiving 15 mmol KPhos IV on 9/04 WNL, no further repletion at this time. Recheck electrolytes in am  11/04 ,PharmD Clinical Pharmacist 01/24/2022 8:30 AM

## 2022-01-24 NOTE — Progress Notes (Signed)
Updated chart to add sister Bjorn Loser contact number. She states she is the one to make decisions in the absence of Ree Kida. Number under additional contact information.

## 2022-01-25 DIAGNOSIS — R6521 Severe sepsis with septic shock: Secondary | ICD-10-CM

## 2022-01-25 LAB — LACTATE DEHYDROGENASE: LDH: 119 U/L (ref 98–192)

## 2022-01-25 LAB — GLUCOSE, CAPILLARY
Glucose-Capillary: 180 mg/dL — ABNORMAL HIGH (ref 70–99)
Glucose-Capillary: 131 mg/dL — ABNORMAL HIGH (ref 70–99)
Glucose-Capillary: 167 mg/dL — ABNORMAL HIGH (ref 70–99)
Glucose-Capillary: 110 mg/dL — ABNORMAL HIGH (ref 70–99)
Glucose-Capillary: 183 mg/dL — ABNORMAL HIGH (ref 70–99)

## 2022-01-25 LAB — CBC
HCT: 24.7 % — ABNORMAL LOW (ref 39.0–52.0)
Hemoglobin: 7.8 g/dL — ABNORMAL LOW (ref 13.0–17.0)
MCH: 28.6 pg (ref 26.0–34.0)
MCHC: 31.6 g/dL (ref 30.0–36.0)
MCV: 90.5 fL (ref 80.0–100.0)
Platelets: 45 10*3/uL — ABNORMAL LOW (ref 150–400)
RBC: 2.73 MIL/uL — ABNORMAL LOW (ref 4.22–5.81)
RDW: 14.6 % (ref 11.5–15.5)
WBC: 5.1 10*3/uL (ref 4.0–10.5)
nRBC: 0 % (ref 0.0–0.2)

## 2022-01-25 LAB — BASIC METABOLIC PANEL
Anion gap: 1 — ABNORMAL LOW (ref 5–15)
BUN: 15 mg/dL (ref 6–20)
CO2: 26 mmol/L (ref 22–32)
Calcium: 7.6 mg/dL — ABNORMAL LOW (ref 8.9–10.3)
Chloride: 114 mmol/L — ABNORMAL HIGH (ref 98–111)
Creatinine, Ser: 0.65 mg/dL (ref 0.61–1.24)
GFR, Estimated: >60 mL/min (ref >60)
Glucose, Bld: 180 mg/dL — ABNORMAL HIGH (ref 70–99)
Potassium: 3.6 mmol/L (ref 3.5–5.1)
Sodium: 141 mmol/L (ref 135–145)

## 2022-01-25 LAB — MAGNESIUM: Magnesium: 2 mg/dL (ref 1.7–2.4)

## 2022-01-25 LAB — CBC WITH DIFFERENTIAL/PLATELET
Abs Immature Granulocytes: 0.02 10*3/uL (ref 0.00–0.07)
Basophils Absolute: 0 10*3/uL (ref 0.0–0.1)
Basophils Relative: 0 %
Eosinophils Absolute: 0.1 10*3/uL (ref 0.0–0.5)
Eosinophils Relative: 1 %
HCT: 25.5 % — ABNORMAL LOW (ref 39.0–52.0)
Hemoglobin: 7.9 g/dL — ABNORMAL LOW (ref 13.0–17.0)
Immature Granulocytes: 0 %
Lymphocytes Relative: 22 %
Lymphs Abs: 1.1 10*3/uL (ref 0.7–4.0)
MCH: 28.3 pg (ref 26.0–34.0)
MCHC: 31 g/dL (ref 30.0–36.0)
MCV: 91.4 fL (ref 80.0–100.0)
Monocytes Absolute: 0.3 10*3/uL (ref 0.1–1.0)
Monocytes Relative: 6 %
Neutro Abs: 3.5 10*3/uL (ref 1.7–7.7)
Neutrophils Relative %: 71 %
Platelets: 43 10*3/uL — ABNORMAL LOW (ref 150–400)
RBC: 2.79 MIL/uL — ABNORMAL LOW (ref 4.22–5.81)
RDW: 14.6 % (ref 11.5–15.5)
WBC: 4.9 10*3/uL (ref 4.0–10.5)
nRBC: 0 % (ref 0.0–0.2)

## 2022-01-25 LAB — TECHNOLOGIST SMEAR REVIEW: Plt Morphology: NORMAL

## 2022-01-25 LAB — PHOSPHORUS: Phosphorus: 2.6 mg/dL (ref 2.5–4.6)

## 2022-01-25 LAB — BILIRUBIN, TOTAL: Total Bilirubin: 0.6 mg/dL (ref 0.3–1.2)

## 2022-01-25 LAB — RETICULOCYTES
Immature Retic Fract: 1.9 % — ABNORMAL LOW (ref 2.3–15.9)
RBC.: 2.8 MIL/uL — ABNORMAL LOW (ref 4.22–5.81)
Retic Count, Absolute: 19.6 10*3/uL (ref 19.0–186.0)
Retic Ct Pct: 0.7 % (ref 0.4–3.1)

## 2022-01-25 LAB — DAT, POLYSPECIFIC AHG (ARMC ONLY): Polyspecific AHG test: NEGATIVE

## 2022-01-25 LAB — BILIRUBIN, DIRECT: Bilirubin, Direct: 0.1 mg/dL (ref 0.0–0.2)

## 2022-01-25 LAB — FIBRINOGEN: Fibrinogen: 422 mg/dL (ref 210–475)

## 2022-01-25 LAB — C-REACTIVE PROTEIN: CRP: 5.1 mg/dL — ABNORMAL HIGH (ref <1.0)

## 2022-01-25 LAB — D-DIMER, QUANTITATIVE: D-Dimer, Quant: 1.82 ug/mL-FEU — ABNORMAL HIGH (ref 0.00–0.50)

## 2022-01-25 LAB — SEDIMENTATION RATE: Sed Rate: 70 mm/hr — ABNORMAL HIGH (ref 0–20)

## 2022-01-25 MED ORDER — TRAZODONE HCL 50 MG PO TABS
50.0000 mg | ORAL_TABLET | Freq: Every evening | ORAL | Status: DC | PRN
  Administered 2022-01-25 – 2022-01-26 (×2): 50 mg via ORAL
  Filled 2022-01-25 (×2): qty 1

## 2022-01-25 MED ORDER — OXYCODONE-ACETAMINOPHEN 7.5-325 MG PO TABS
2.0000 | ORAL_TABLET | Freq: Once | ORAL | Status: AC
  Administered 2022-01-25: 2 via ORAL
  Filled 2022-01-25: qty 2

## 2022-01-25 MED ORDER — IPRATROPIUM-ALBUTEROL 0.5-2.5 (3) MG/3ML IN SOLN
3.0000 mL | Freq: Four times a day (QID) | RESPIRATORY_TRACT | Status: DC | PRN
Start: 2022-01-25 — End: 2022-02-25
  Administered 2022-02-18: 3 mL via RESPIRATORY_TRACT
  Filled 2022-01-25: qty 3

## 2022-01-25 MED ORDER — FONDAPARINUX SODIUM 2.5 MG/0.5ML ~~LOC~~ SOLN
2.5000 mg | SUBCUTANEOUS | Status: DC
  Administered 2022-01-25: 2.5 mg via SUBCUTANEOUS
  Filled 2022-01-25: qty 0.5

## 2022-01-25 NOTE — Progress Notes (Signed)
   01/25/22 1000  Clinical Encounter Type  Visited With Patient and family together  Visit Type Initial;Critical Care  Spiritual Encounters  Spiritual Needs Grief support   Chaplain provided care to family as they grapple with critical care situation of patient.

## 2022-01-25 NOTE — Progress Notes (Signed)
   Date of Admission:  01/21/2022     ID: Earl Moreno is a 56 y.o. male  Principal Problem:   Septic shock (HCC) Active Problems:   Altered mental status   Gram-negative bacteremia   Aortic valve endocarditis   Acute HFrEF (heart failure with reduced ejection fraction) (HCC)    Subjective: Process of being extubated  Medications:   Chlorhexidine Gluconate Cloth  6 each Topical Daily   docusate  100 mg Per Tube BID   fondaparinux (ARIXTRA) injection  2.5 mg Subcutaneous Q24H   free water  30 mL Per Tube Q4H   insulin aspart  0-15 Units Subcutaneous Q4H   insulin glargine-yfgn  10 Units Subcutaneous QHS   ipratropium-albuterol  3 mL Nebulization TID   multivitamin  15 mL Per Tube Daily   mouth rinse  15 mL Mouth Rinse Q2H   pantoprazole  40 mg Per Tube Daily   polyethylene glycol  17 g Per Tube Daily   sodium chloride flush  3 mL Intravenous Q12H   thiamine (VITAMIN B1) injection  100 mg Intravenous Daily    Objective: Vital signs in last 24 hours: Temp:  [98.6 F (37 C)-99.2 F (37.3 C)] 99 F (37.2 C) (09/06 1300) Pulse Rate:  [84-110] 87 (09/06 1000) Resp:  [20-29] 23 (09/06 1000) BP: (88-108)/(55-73) 89/55 (09/06 1000) SpO2:  [99 %-100 %] 100 % (09/06 1134) FiO2 (%):  [28 %] 28 % (09/06 1134) Weight:  [53.2 kg] 53.2 kg (09/06 0500)  LDA Left IJ foley  PHYSICAL EXAM:  General: Awake, intubated, SBT in process Lungs: b/l air entry Heart: s1s2 Abdomen: Soft, non-tender,not distended. Bowel sounds normal. No masses Extremities: atraumatic, no cyanosis. No edema. No clubbing Skin: No rashes or lesions. Or bruising Lymph: Cervical, supraclavicular normal. Neurologic: Grossly non-focal  Lab Results Recent Labs    01/24/22 0321 01/25/22 0430 01/25/22 0700  WBC 8.4 5.1 4.9  HGB 8.8* 7.8* 7.9*  HCT 27.5* 24.7* 25.5*  NA 141 141  --   K 3.1* 3.6  --   CL 113* 114*  --   CO2 25 26  --   BUN 15 15  --   CREATININE 0.73 0.65  --    Liver Panel Recent  Labs    01/23/22 0519 01/24/22 0321 01/25/22 0634  PROT  --  6.4*  --   ALBUMIN 2.3* 2.8*  --   AST  --  18  --   ALT  --  10  --   ALKPHOS  --  55  --   BILITOT  --  0.7 0.6  BILIDIR  --   --  0.1    Microbiology: 01/22/22 - serratia Studies/Results: No results found.   Assessment/Plan: Encephalopathy secondary to sepsis and infarct ? Acute hypoxic resp failure- intubated. SBT in process   Serratia bacteremia with aortic valve endocarditis On dual coverage with cefepime + cipro Repeat blood culture  IVDA   Osteomyelitis rt pubic ramus Previous bacteremias including serratia , enterobacter- not treated because of him leaving AMA ? _Anemia   Thrombocytopenia  Discussed the management with care team

## 2022-01-25 NOTE — Procedures (Signed)
Extubation Procedure Note  Patient Details:   Name: Earl Moreno DOB: 1965/11/16 MRN: [id number]   Airway Documentation:    Vent end date: 01/25/22 Vent end time: 1406   Evaluation  O2 sats: stable throughout Complications: No apparent complications Patient did tolerate procedure well. Bilateral Breath Sounds: Clear, Diminished   Yes able to cough and speak.  Extubated to room air with SpO2 100%  [name] 01/25/2022, 2:08 PM

## 2022-01-25 NOTE — Progress Notes (Signed)
Patient extubated to room air.  Patient able to state his name.  Family to bedside and patient became emotional and asked to be taken home.

## 2022-01-25 NOTE — Progress Notes (Addendum)
NAME:  Earl Moreno, MRN:  [id number], DOB:  07/24/1965, LOS: 3 ADMISSION DATE:  01/21/2022  History of Present Illness:  56 yo M presenting to [facility] ED on 01/21/22 via EMS complaining that he does not feel well. History provided by ED documentation as there is no family available and patient intubated and sedated. EMS reported that they think he has a son who called 911.  Per chart review patient with multiple admissions for sepsis including pyelonephritis, GN bacteremia & suspected osteomyelitis in his right hip as recently as 01/15/22. There have been multiple attempts to treat with IV antibiotics & perform a TEE and rule out endocarditis but the patient continues to leave AMA.   ED course: Upon arrival patient combative asking for help but then telling staff to leave I'm alone. Patient febrile & tachycardic. Sepsis protocol initiated but hindered by the fact that the patient was so uncooperative. Patient received Ativan and Haldol in order to restrain him enough to place a PIV. Lab work significant for lactic acidosis, leukocytosis with a left shift & an elevated PCT. Mild hyponatremia, hyperglycemia & an elevated troponin. UDS positive for cocaine & opiate. CT head concerning for possible subacute cortical infarct. Precedex drip was started, but even at max the patient still thrashing in the bed.  Medications given: acetaminophen, cefepime & vancomycin, Geodon, ativan, haldol, etomidate & rocuronium, 2 L of LR Initial Vitals: 100.5, 20, 111, 116/68 & spO2 98 % on RA Significant labs: (Labs/ Imaging personally reviewed) EKG Interpretation: Date: 01/22/22, EKG Time: 02:30, Rate: 120, Rhythm: ST, QRS Axis:  LAD, Intervals: normal, ST/T Wave abnormalities: none, Narrative Interpretation: ST Chemistry: Na+: 129 (when corrected for glucose- 134, K+: 3.5, BUN/Cr.: 9/0.80, Serum CO2/ AG: 24/ 15 Hematology: WBC: 14.0, Hgb: 12.4,  Troponin: 89, CK: 207, Lactic/ PCT: 5.1/ 2.98, COVID-19 & Influenza A/B:  negative UDS: +cocaine & +opiates ABG: 7.55/ 30/ 157/ 26.2   CXR 01/22/22: no acute cardiopulmonary abnormalities CT head wo contrast 01/22/22: Focal loss of gray-white matter differentiation within the right parietal cortex which may reflect a subacute cortical infarct, though this is not well characterized on this examination. No associated mass effect or midline shift. Contrast enhanced MRI examination is recommended for further evaluation CT chest/abdomen/pelvis w contrast 01/22/22: No acute or inflammatory process identified in the chest. Satisfactory endotracheal and enteric tubes. Abdomen and pelvis remarkable for:- abnormal but nonspecific small to moderate volume of simple appearing free fluid in the pelvis. Paucity of intra-abdominal fat limits bowel detail, but no bowel obstruction or inflammation is evident.- small hypoenhancing areas in the superior spleen, with underlying borderline splenomegaly. Although nonspecific consider small splenic infarcts or hematogenous infection in this setting.- focal bone erosion of the posteroinferior right pubic ramus since last July. But no other lytic osseous lesion identified, and no regional inflammation. This would be an unusual site for osteomyelitis. Query a metabolic (such as parathyroid) bone disorder. Metastatic disease also not excluded.  Pertinent  Medical History  Heroin/cocaine abuse T2DM Splenic infarct GN sepsis Hyponatremia Homeless  Significant Hospital Events: Including procedures, antibiotic start and stop dates in addition to other pertinent events   01/22/22: admit to ICU after intubation and mechanical ventilation due to AMS and sedative administration as well as severe sepsis s/t suspected osteomyelitis 01/22/22: Echocardiogram confirms large aortic valve vegetation, patient was septic shock being stabilized, MRI brain with small areas of diffusion abnormality right greater than left parieto-occipital regions consistent with evolving  subacute ischemic infarcts, short interval follow-up MRI  suggested.  MR pelvis osteomyelitis the site of the right hamstring tendon origin possible myositis, bilateral iliopsoas bursitis 01/23/22: Discussed with cardiology for potential TEE however, the recommendation was for transfer to [facility] for TEE and cardiothoracic surgery evaluation.  01/24/22: HITT antibodies sent. Failed SBT due to tachypnea  Interim History / Subjective:    Objective   Blood pressure 107/69, pulse 85, temperature 98.6 F (37 C), temperature source Axillary, resp. rate (!) 21, weight 53.2 kg, SpO2 100 %.    Vent Mode: PSV;CPAP FiO2 (%):  [28 %] 28 % Set Rate:  [10 bmp] 10 bmp Vt Set:  [470 mL] 470 mL PEEP:  [5 cmH20] 5 cmH20 Pressure Support:  [8 cmH20] 8 cmH20 Plateau Pressure:  [12 cmH20-16 cmH20] 12 cmH20   Intake/Output Summary (Last 24 hours) at 01/25/2022 0836 Last data filed at 01/25/2022 0600 Gross per 24 hour  Intake 2678.78 ml  Output 2050 ml  Net 628.78 ml   Filed Weights   01/25/22 0500  Weight: 53.2 kg    Examination: Physical Exam Constitutional:      General: He is not in acute distress.    Appearance: He is ill-appearing.  HENT:     Head: Normocephalic.     Mouth/Throat:     Mouth: Mucous membranes are moist.     Comments: ETT in place Eyes:     Conjunctiva/sclera: Conjunctivae normal.     Pupils: Pupils are equal, round, and reactive to light.  Cardiovascular:     Rate and Rhythm: Normal rate and regular rhythm.     Pulses: Normal pulses.     Heart sounds: Murmur heard.  Pulmonary:     Effort: Pulmonary effort is normal.     Breath sounds: Normal breath sounds. No wheezing.     Comments: Ventilated breath sounds Abdominal:     General: Abdomen is flat.     Palpations: Abdomen is soft.  Genitourinary:    Comments: Foley in place Musculoskeletal:     Cervical back: Normal range of motion and neck supple.  Neurological:     Mental Status: He is disoriented.     Comments:  Sedated. Opens eyes to tactile stimuli but does not follow commands.     Resolved Hospital Problem list     Assessment & Plan:   32 undomiciled male with history of IVDU and osteomyelitis presenting to the hospital with septic shock found to have Serratia bacteremia and aortic valve endocarditis.   #septic shock #Left sided endocarditis #Aortic valve vegetation #Serratia Marcecens bacteremia #osteomyelitis in the RIGHT hip  Initial interventions/workup included volume resuscitation and IV antibiotics. Blood cultures have since grown Serratia Marcescens. Risks for surgery outweigh benefits per surgeons at [facility]. Will continue supportive therapy. ID consulted and he is on double therapy with Cefepime and Ciprofloxacin.  - ID consult appreciated - Patient intubated, mechanically ventilated - Daily CBC, monitor WBC/ fever curve - IV antibiotics: Cefepime and Ciprofloxacin - Strict I/O's: alert provider if UOP < 0.5 mL/kg/hr - [facility] critical care and cardiothoracic team consulted, not a candidate for surgery.   #Aortic valve endocarditis #Serratia marcecens bacteremia #Heart Failure with mid range Ejection Fraction #Acute Systolic Heart Failure   Cardiothoracic surgery consulted (documented by Dr. Patsey Berthold 9/4) and conversation had regarding possibility of AVR. Patient very high risk and risks deemed to outweigh the benefits of AVR. Declined as a candidate for surgery. Will continue supportive management with antibiotics and repeat echocardiography.   - Continue antibiotics, ID consult  appreciated - Ischemic infarcts, osteomyelitis - Likely benefit from a TEE to evaluate for aortic root abscess in the future, unclear if will change direct management in re to surgery.   #Thrombocytopenia   Precipitous drop in platelet count. Previous exposure to heparin products. High 4T score and high risk for HITT. Will initiate fondaparinux and send HITT panel. SRA if HITT ab positive.  Other differentials include bone marrow suppression from sepsis/endocarditis and DIC.  -monitor platelets daily -follow up HITT panel -hemolysis workup -DIC workup daily -hold DVT prophylaxis if platelets <46  #Toxic Metabolic Encephalopathy #Acute Hypoxic Respiratory Failure ruled out, Intubated for airway protection  On entilator support & lung protective strategies. Wean settings as tolerated and SBT daily.  - Wean PEEP & FiO2 as tolerated, maintain SpO2 > 90% - Head of bed elevated 30 degrees, VAP protocol in place - Plateau pressures less than 30 cm H20  - Intermittent chest x-ray & ABG PRN - Daily WUA with SBT as tolerated  - Ensure adequate pulmonary hygiene  - PAD protocol in place: continue Fentanyl drip & Propofol drip - neuro checks q4 - MRI brain with subacute ischemic infarcts  PM update: Patient passed an SBT and was successfully extubated to room air.   #Uncontrolled Type 2 Diabetes Mellitus Hemoglobin A1C: 13.5 (11/2021) - Monitor CBG Q 4 hour - basal bolus regimen - SSI moderate dosing, will start glargine insulin 10 units nightly + sliding scale - target range while in ICU: 140-180 - Diabetes Coordinator following, appreciate recommendations   #Elevated Troponin secondary to demand ischemia - Mild trop elevation in the setting of endocarditis and HFrEF - continuous cardiac monitoring - will need TEE given endocarditis to r/o root abscess. Will not change surgical risk   #Severe protein calorie malnutrition - Poor nutritional status in the setting of substance abuse, chronic illness - Initiate tube feeds - Multivitamin supplementation   Systems based checklist:   Neuro: septic emboli to brain. Toxic metabolic encephalopathy. Propofol/Fentanyl for analgosedation. Titrate for RASS -1 Cardiovascular: Aortic valve abscess with HFrEF secondary to AI and septic emboli. High risk for surgery, deemed poor surgical candidate by CT surgery. Unfortunately poor  prognosis. Continue with supportive care. Pulmonary: Minimal vent settings. Daily SBT, extubate when able GI: tube feeds and GI prophylaxis. Monitor LFTs Endo: Basal bolus, goal glucose 140-180 Renal: Kidney function at baseline. Replete electrolytes PRN Hem/Onc: Thrombocytopenis. HITT panel sent, fondaparinux for prophylaxis ID: ID consult for serratia bacteremia and endocarditis. Continue Cefepime and Ciprofloxacin  Best Practice (right click and "Reselect all SmartList Selections" daily)   Diet/type: tubefeeds DVT prophylaxis: other (fondaparinux) GI prophylaxis: PPI Lines: Central line Foley:  Yes, and it is still needed Code Status:  DNR Last date of multidisciplinary goals of care discussion [01/25/2022]  Labs   CBC: Recent Labs  Lab 01/22/22 0255 01/23/22 0519 01/24/22 0321 01/25/22 0430 01/25/22 0700  WBC 14.0* 16.0* 8.4 5.1 4.9  NEUTROABS 12.9*  --   --   --  3.5  HGB 12.4* 8.8* 8.8* 7.8* 7.9*  HCT 38.2* 26.6* 27.5* 24.7* 25.5*  MCV 87.2 85.8 87.6 90.5 91.4  PLT 250 106* 62* 45* 43*    Basic Metabolic Panel: Recent Labs  Lab 01/22/22 0255 01/22/22 0520 01/22/22 2044 01/23/22 0519 01/24/22 0321 01/25/22 0430  NA 129*  --  135 138 141 141  K 3.5  --  2.3* 3.2* 3.1* 3.6  CL 90*  --   --  107 113* 114*  CO2 24  --   --  _0 GLUCOSE 304*  --   --  111* 175* 180*  BUN 9  --   --  _1 CREATININE 0.80  --   --  0.66 0.73 0.65  CALCIUM 8.4*  --   --  7.4* 7.9* 7.6*  MG  --  1.7 1.9 1.8 2.2 2.0  PHOS  --  2.1* 3.2 2.0* 2.6 2.6   GFR: Estimated Creatinine Clearance: 76.7 mL/min (by C-G formula based on SCr of 0.65 mg/dL). Recent Labs  Lab 01/22/22 0255 01/22/22 0855 01/22/22 2044 01/23/22 0519 01/24/22 0321 01/25/22 0430 01/25/22 0700  PROCALCITON 2.98  --   --   --   --   --   --   WBC 14.0*  --   --  16.0* 8.4 5.1 4.9  LATICACIDVEN 5.1* 4.9* 1.8  --   --   --   --     Liver Function Tests: Recent Labs  Lab 01/22/22 0255  01/23/22 0519 01/24/22 0321 01/25/22 0634  AST 33  --  18  --   ALT 14  --  10  --   ALKPHOS 117  --  55  --   BILITOT 1.1  --  0.7 0.6  PROT 9.0*  --  6.4*  --   ALBUMIN 2.8* 2.3* 2.8*  --    No results for input(s): "LIPASE", "AMYLASE" in the last 168 hours. Recent Labs  Lab 01/22/22 0255  AMMONIA 16    ABG    Component Value Date/Time   PHART 7.5 (H) 01/23/2022 1040   PCO2ART 31 (L) 01/23/2022 1040   PO2ART 100 01/23/2022 1040   HCO3 24.2 01/23/2022 1040   O2SAT 99.8 01/23/2022 1040     Coagulation Profile: Recent Labs  Lab 01/22/22 0255  INR 1.3*    Cardiac Enzymes: Recent Labs  Lab 01/22/22 0255 01/23/22 0519  CKTOTAL 207 47*    HbA1C: Hgb A1c MFr Bld  Date/Time Value Ref Range Status  11/29/2021 02:02 PM 13.2 (H) 4.8 - 5.6 % Final    Comment:    (NOTE) Pre diabetes:          5.7%-6.4%  Diabetes:              >6.4%  Glycemic control for   <7.0% adults with diabetes   10/19/2020 04:44 PM 14.4 (H) 4.8 - 5.6 % Final    Comment:    (NOTE)         Prediabetes: 5.7 - 6.4         Diabetes: >6.4         Glycemic control for adults with diabetes: <7.0     CBG: Recent Labs  Lab 01/24/22 1619 01/24/22 1953 01/24/22 2347 01/25/22 0334 01/25/22 0757  GLUCAP 86 146* 199* 180* 131*    Review of Systems:   Unable to obtain  Past Medical History:  He,  has a past medical history of Diabetes mellitus without complication ([place]).   Surgical History:   Past Surgical History:  Procedure Laterality Date   I & D EXTREMITY Left 06/05/2018   Procedure: IRRIGATION AND DEBRIDEMENT EXTREMITY-LEFT HAND;  Surgeon: Lovell Sheehan, MD;  Location: [facility] ORS;  Service: Orthopedics;  Laterality: Left;     Social History:   reports that he has been smoking. He has been smoking an average of .5 packs per day. He has never used smokeless tobacco. He reports current drug use. Drugs: Cocaine and IV. He reports that he  does not drink alcohol.   Family History:   His family history includes Diabetes in his mother.   Allergies Allergies  Allergen Reactions   Heparin     Undergoing HITT w/u follow pending PF4 & SRA result     Home Medications  Prior to Admission medications   Medication Sig Start Date End Date Taking? Authorizing Provider  amLODipine (NORVASC) 10 MG tablet Take 1 tablet (10 mg total) by mouth daily. 12/02/21  Yes Barb Merino, MD  Baclofen 5 MG TABS Take 5 mg by mouth 3 (three) times daily as needed for muscle spasms. 12/02/21  Yes Ghimire, Dante Gang, MD  Blood Glucose Monitoring Suppl (BLOOD GLUCOSE MONITOR SYSTEM) w/Device KIT USE UP TO 4 TIMES DAILY AS DIRECTED. 02/26/21  Yes [name], MD  Blood Glucose Monitoring Suppl (RIGHTEST [id number] BLOOD GLUCOSE) w/Device KIT Use as directed to test blood sugar up to 4 times daily 12/02/21  Yes Ghimire, Dante Gang, MD  Insulin Lispro Prot & Lispro (HUMALOG MIX 75/25 KWIKPEN) (75-25) 100 UNIT/ML Kwikpen Inject 18 Units into the skin 2 (two) times daily. 12/02/21  Yes Ghimire, Dante Gang, MD  Insulin Pen Needle (COMFORT EZ PEN NEEDLES) 32G X 4 MM MISC USE AS DIRECTED TWICE DAILY (MORNING AND BEDTIME) 12/02/21  Yes Barb Merino, MD  Rightest GL300 Lancets MISC USE AS DIRECTED UP TO 4 TIMES DAILY. 12/02/21  Yes Barb Merino, MD  glucose blood (RIGHTEST [id number] BLOOD GLUCOSE) test strip Use as directed to check blood sugar up to 4 times daily 12/02/21   Barb Merino, MD  levofloxacin (LEVAQUIN) 750 MG tablet Take 750 mg by mouth daily. Patient not taking: Reported on 01/22/2022 01/12/22 01/26/22  [provider]     Critical care time: 44 minutes

## 2022-01-25 NOTE — Progress Notes (Signed)
Patient refusing to have blood cultures drawn.

## 2022-01-25 NOTE — Progress Notes (Signed)
All sedation turned off.  Patient following simple commands. Patient placed in spontaneous mode on vent for vent wean. Dr. Aundria Rud aware.

## 2022-01-25 NOTE — Progress Notes (Signed)
PHARMACY CONSULT NOTE  Pharmacy Consult for Electrolyte Monitoring and Replacement   Recent Labs: Potassium (mmol/L)  Date Value  01/25/2022 3.6  01/19/2013 3.6   Magnesium (mg/dL)  Date Value  65/68/1275 2.0   Calcium (mg/dL)  Date Value  17/00/1749 7.6 (L)   Calcium, Total (mg/dL)  Date Value  44/96/7591 8.7   Albumin (g/dL)  Date Value  63/84/6659 2.8 (L)  12/30/2012 3.9   Phosphorus (mg/dL)  Date Value  93/57/0177 2.6   Sodium (mmol/L)  Date Value  01/25/2022 141  01/19/2013 140     Assessment: 56 y.o. male who complains of right-sided low back pain dysuria and frequency.  Per chart review patient with multiple admissions for sepsis including pyelonephritis, GN bacteremia & suspected osteomyelitis in his right hip as recently as 01/15/22. There have been multiple attempts to treat with IV antibiotics & perform a TEE and rule out endocarditis but the patient continues to leave AMA. Pharmacy is asked to follow and replace electrolytes while in CCU  Nutrition: FWF 30 q4hs, TF @55ml /h (~1312ml/d), NS KVO  Goal of Therapy:  Electrolytes WNL  Plan:  Scr stable; Net +6.5L; UOP 1.81ml/k/h K: 3.1>3.6: WNL today, No further repletion at this time. Mg 2.2>2: Remains WNL, no further repletion at this time.  Phos 2.6>2.6: Remains WNL, no further repletion at this time. Recheck electrolytes in am  11m ,PharmD Clinical Pharmacist 01/25/2022 12:54 PM

## 2022-01-25 NOTE — Progress Notes (Signed)
Sedation decreased by 50% for sedation vacation and possible vent wean.

## 2022-01-26 ENCOUNTER — Encounter: Payer: Self-pay | Admitting: Pulmonary Disease

## 2022-01-26 DIAGNOSIS — Z7189 Other specified counseling: Secondary | ICD-10-CM

## 2022-01-26 DIAGNOSIS — Z515 Encounter for palliative care: Secondary | ICD-10-CM

## 2022-01-26 DIAGNOSIS — F1920 Other psychoactive substance dependence, uncomplicated: Secondary | ICD-10-CM

## 2022-01-26 DIAGNOSIS — M8618 Other acute osteomyelitis, other site: Secondary | ICD-10-CM

## 2022-01-26 DIAGNOSIS — R7881 Bacteremia: Secondary | ICD-10-CM

## 2022-01-26 DIAGNOSIS — I5021 Acute systolic (congestive) heart failure: Secondary | ICD-10-CM

## 2022-01-26 DIAGNOSIS — M869 Osteomyelitis, unspecified: Secondary | ICD-10-CM | POA: Diagnosis present

## 2022-01-26 DIAGNOSIS — I330 Acute and subacute infective endocarditis: Secondary | ICD-10-CM

## 2022-01-26 LAB — CBC WITH DIFFERENTIAL/PLATELET
Abs Immature Granulocytes: 0.05 10*3/uL (ref 0.00–0.07)
Basophils Absolute: 0 10*3/uL (ref 0.0–0.1)
Basophils Relative: 0 %
Eosinophils Absolute: 0 10*3/uL (ref 0.0–0.5)
Eosinophils Relative: 0 %
HCT: 23.7 % — ABNORMAL LOW (ref 39.0–52.0)
Hemoglobin: 7.8 g/dL — ABNORMAL LOW (ref 13.0–17.0)
Immature Granulocytes: 1 %
Lymphocytes Relative: 16 %
Lymphs Abs: 1.1 10*3/uL (ref 0.7–4.0)
MCH: 28.2 pg (ref 26.0–34.0)
MCHC: 32.9 g/dL (ref 30.0–36.0)
MCV: 85.6 fL (ref 80.0–100.0)
Monocytes Absolute: 0.4 10*3/uL (ref 0.1–1.0)
Monocytes Relative: 6 %
Neutro Abs: 5.5 10*3/uL (ref 1.7–7.7)
Neutrophils Relative %: 77 %
Platelets: 45 10*3/uL — ABNORMAL LOW (ref 150–400)
RBC: 2.77 MIL/uL — ABNORMAL LOW (ref 4.22–5.81)
RDW: 14.2 % (ref 11.5–15.5)
WBC: 7.2 10*3/uL (ref 4.0–10.5)
nRBC: 0 % (ref 0.0–0.2)

## 2022-01-26 LAB — BASIC METABOLIC PANEL
Anion gap: 7 (ref 5–15)
BUN: 18 mg/dL (ref 6–20)
CO2: 26 mmol/L (ref 22–32)
Calcium: 7.9 mg/dL — ABNORMAL LOW (ref 8.9–10.3)
Chloride: 105 mmol/L (ref 98–111)
Creatinine, Ser: 0.63 mg/dL (ref 0.61–1.24)
GFR, Estimated: >60 mL/min (ref >60)
Glucose, Bld: 147 mg/dL — ABNORMAL HIGH (ref 70–99)
Potassium: 3.2 mmol/L — ABNORMAL LOW (ref 3.5–5.1)
Sodium: 138 mmol/L (ref 135–145)

## 2022-01-26 LAB — GLUCOSE, CAPILLARY
Glucose-Capillary: 115 mg/dL — ABNORMAL HIGH (ref 70–99)
Glucose-Capillary: 143 mg/dL — ABNORMAL HIGH (ref 70–99)
Glucose-Capillary: 177 mg/dL — ABNORMAL HIGH (ref 70–99)
Glucose-Capillary: 190 mg/dL — ABNORMAL HIGH (ref 70–99)
Glucose-Capillary: 190 mg/dL — ABNORMAL HIGH (ref 70–99)
Glucose-Capillary: 77 mg/dL (ref 70–99)
Glucose-Capillary: 83 mg/dL (ref 70–99)

## 2022-01-26 LAB — HAPTOGLOBIN: Haptoglobin: 200 mg/dL (ref 29–370)

## 2022-01-26 LAB — MAGNESIUM: Magnesium: 1.8 mg/dL (ref 1.7–2.4)

## 2022-01-26 LAB — HEPARIN INDUCED PLATELET AB (HIT ANTIBODY): Heparin Induced Plt Ab: 0.087 OD (ref 0.000–0.400)

## 2022-01-26 LAB — PHOSPHORUS: Phosphorus: 2.6 mg/dL (ref 2.5–4.6)

## 2022-01-26 LAB — TRIGLYCERIDES: Triglycerides: 79 mg/dL (ref <150)

## 2022-01-26 MED ORDER — OXYCODONE HCL 5 MG PO TABS
5.0000 mg | ORAL_TABLET | Freq: Four times a day (QID) | ORAL | Status: DC
  Administered 2022-01-26: 5 mg via ORAL
  Filled 2022-01-26: qty 1

## 2022-01-26 MED ORDER — LORAZEPAM 2 MG/ML IJ SOLN
2.0000 mg | Freq: Four times a day (QID) | INTRAMUSCULAR | Status: DC | PRN
  Administered 2022-01-27: 2 mg via INTRAVENOUS
  Filled 2022-01-26: qty 1

## 2022-01-26 MED ORDER — ACETAMINOPHEN 325 MG PO TABS
650.0000 mg | ORAL_TABLET | ORAL | Status: DC | PRN
Start: 2022-01-26 — End: 2022-01-26

## 2022-01-26 MED ORDER — PANTOPRAZOLE SODIUM 40 MG PO TBEC
40.0000 mg | DELAYED_RELEASE_TABLET | Freq: Every day | ORAL | Status: DC
Start: 2022-01-26 — End: 2022-02-08
  Administered 2022-01-26 – 2022-02-08 (×11): 40 mg via ORAL
  Filled 2022-01-26 (×13): qty 1

## 2022-01-26 MED ORDER — METHADONE HCL 10 MG PO TABS
40.0000 mg | ORAL_TABLET | Freq: Every day | ORAL | Status: DC
  Administered 2022-01-26 – 2022-01-27 (×2): 40 mg via ORAL
  Filled 2022-01-26 (×2): qty 4

## 2022-01-26 MED ORDER — POTASSIUM CHLORIDE 20 MEQ PO PACK: 40.0000 meq | PACK | Freq: Once | ORAL | Status: DC

## 2022-01-26 MED ORDER — HALOPERIDOL LACTATE 5 MG/ML IJ SOLN
2.0000 mg | Freq: Four times a day (QID) | INTRAMUSCULAR | Status: DC | PRN
  Administered 2022-01-29 – 2022-01-31 (×3): 2 mg via INTRAVENOUS
  Filled 2022-01-26 (×3): qty 1

## 2022-01-26 MED ORDER — POTASSIUM CHLORIDE 20 MEQ PO PACK
40.0000 meq | PACK | ORAL | Status: DC
  Administered 2022-01-26: 40 meq via ORAL
  Filled 2022-01-26 (×2): qty 2

## 2022-01-26 MED ORDER — ENSURE ENLIVE PO LIQD
237.0000 mL | Freq: Three times a day (TID) | ORAL | Status: DC
  Administered 2022-01-26 – 2022-02-24 (×52): 237 mL via ORAL

## 2022-01-26 MED ORDER — OXYCODONE HCL 5 MG PO TABS: 10.0000 mg | ORAL_TABLET | Freq: Four times a day (QID) | ORAL | Status: DC

## 2022-01-26 MED ORDER — LIDOCAINE 5 % EX PTCH
1.0000 | MEDICATED_PATCH | CUTANEOUS | Status: DC
  Administered 2022-01-26 – 2022-02-01 (×5): 1 via TRANSDERMAL
  Filled 2022-01-26 (×12): qty 1

## 2022-01-26 MED ORDER — ACETAMINOPHEN 325 MG PO TABS
975.0000 mg | ORAL_TABLET | Freq: Four times a day (QID) | ORAL | Status: DC
  Administered 2022-01-26 – 2022-02-08 (×27): 975 mg via ORAL
  Filled 2022-01-26 (×39): qty 3

## 2022-01-26 MED ORDER — KETOROLAC TROMETHAMINE 15 MG/ML IJ SOLN
30.0000 mg | Freq: Once | INTRAMUSCULAR | Status: AC
  Administered 2022-01-26: 30 mg via INTRAVENOUS
  Filled 2022-01-26: qty 2

## 2022-01-26 MED ORDER — ENOXAPARIN SODIUM 40 MG/0.4ML IJ SOSY
40.0000 mg | PREFILLED_SYRINGE | INTRAMUSCULAR | Status: DC
  Administered 2022-01-26 – 2022-02-23 (×20): 40 mg via SUBCUTANEOUS
  Filled 2022-01-26 (×27): qty 0.4

## 2022-01-26 MED ORDER — POTASSIUM CHLORIDE CRYS ER 20 MEQ PO TBCR
40.0000 meq | EXTENDED_RELEASE_TABLET | Freq: Once | ORAL | Status: AC
Start: 2022-01-26 — End: 2022-01-26
  Administered 2022-01-26: 40 meq via ORAL
  Filled 2022-01-26: qty 2

## 2022-01-26 MED ORDER — ADULT MULTIVITAMIN W/MINERALS CH
1.0000 | ORAL_TABLET | Freq: Every day | ORAL | Status: DC
  Administered 2022-01-26 – 2022-02-08 (×10): 1 via ORAL
  Filled 2022-01-26 (×12): qty 1

## 2022-01-26 MED ORDER — POTASSIUM CHLORIDE 10 MEQ/100ML IV SOLN
10.0000 meq | INTRAVENOUS | Status: DC
  Filled 2022-01-26 (×4): qty 100

## 2022-01-26 MED ORDER — ADULT MULTIVITAMIN LIQUID CH
15.0000 mL | Freq: Every day | ORAL | Status: DC
  Filled 2022-01-26: qty 15

## 2022-01-26 MED ORDER — MORPHINE SULFATE (PF) 2 MG/ML IV SOLN
1.0000 mg | Freq: Once | INTRAVENOUS | Status: AC
  Administered 2022-01-26: 1 mg via INTRAVENOUS
  Filled 2022-01-26: qty 1

## 2022-01-26 NOTE — BH Assessment (Signed)
IVC PAPERS  RESCINDED  PER  JAMIE  LORD  NP  INFORMED  MICHEAL RN  (CCU)

## 2022-01-26 NOTE — Progress Notes (Signed)
Palliative: Thank you for this consult.  Earl Moreno is listed as involuntarily committed.  He is unable to participate in goals of care discussions at this time.  PMT to follow once no longer under IVC.  Conference with CCM attending and CCM NP.  Plan: PMT consults to be completed once cleared by psychiatry.  No charge Lillia Carmel, NP Palliative medicine team Team phone [phone] Greater than 50% of this time was spent counseling and coordinating care related to the above assessment and plan.

## 2022-01-26 NOTE — Discharge Instructions (Signed)
[facility]  [address] [place], [place] [address] New Patients Phone: [phone]  [facility] [address] [place]  [phone]  [facility] [address]  [phone]

## 2022-01-26 NOTE — Consult Note (Signed)
[facility] Face-to-Face Psychiatry Consult   Reason for Consult:  agitation, polysubstance abuse Referring Physician:  Dr Delton See Patient Identification: Earl Moreno MRN:  [id number] Principal Diagnosis: Septic shock [facility]) Diagnosis:  Principal Problem:   Septic shock (HCC) Active Problems:   Polysubstance (including opioids) dependence with physiol dependence (HCC)   Altered mental status   Gram-negative bacteremia   Aortic valve endocarditis   Acute HFrEF (heart failure with reduced ejection fraction) (HCC)   Total Time spent with patient: 45 minutes  Subjective:   Earl Moreno is a 56 y.o. male patient admitted with septic shock.  HPI:  56 yo male admitted with septic shock.  Psych consult placed for agitation.  On assessment, the patient is calm and cooperative.  The nurse is at his bedside and reports he has not had any issues with behaviors on his shift.  Client denies depression, no psychosis, paranoia, or homicidal ideations. He reports using 3 injections of heroin daily prior to admission and plans to never use again.  However, he is not interested in rehab.  He is interested in returning to the methadone clinic in [place], information placed in his discharge instructions as he reports he was able to stop heroin use for a year.  Psych cleared, PRN agitation medications placed.  Past Psychiatric History: polysubstance use disorder  Risk to Self:  none Risk to Others:  none Prior Inpatient Therapy:  addiction treatment Prior Outpatient Therapy:  none  Past Medical History:  Past Medical History:  Diagnosis Date   Diabetes mellitus without complication (HCC)     Past Surgical History:  Procedure Laterality Date   I & D EXTREMITY Left 06/05/2018   Procedure: IRRIGATION AND DEBRIDEMENT EXTREMITY-LEFT HAND;  Surgeon: Lyndle Herrlich, MD;  Location: [facility] ORS;  Service: Orthopedics;  Laterality: Left;   Family History:  Family History  Problem Relation Age of Onset    Diabetes Mother    Family Psychiatric  History: none Social History:  Social History   Substance and Sexual Activity  Alcohol Use No     Social History   Substance and Sexual Activity  Drug Use Yes   Types: Cocaine, IV   Comment: Heroin    Social History   Socioeconomic History   Marital status: Married    Spouse name: Not on file   Number of children: Not on file   Years of education: Not on file   Highest education level: Not on file  Occupational History   Not on file  Tobacco Use   Smoking status: Every Day    Packs/day: 0.50    Types: Cigarettes   Smokeless tobacco: Never  Substance and Sexual Activity   Alcohol use: No   Drug use: Yes    Types: Cocaine, IV    Comment: Heroin   Sexual activity: Not on file    Comment: occasionally  Other Topics Concern   Not on file  Social History Narrative   Not on file   Social Determinants of Health   Financial Resource Strain: Not on file  Food Insecurity: Not on file  Transportation Needs: Not on file  Physical Activity: Not on file  Stress: Not on file  Social Connections: Not on file   Additional Social History:    Allergies:   Allergies  Allergen Reactions   Heparin     Undergoing HITT w/u follow pending PF4 & SRA result    Labs:  Results for orders placed or performed during the hospital encounter  of 01/21/22 (from the past 48 hour(s))  Glucose, capillary     Status: None   Collection Time: 01/24/22  4:19 PM  Result Value Ref Range   Glucose-Capillary 86 70 - 99 mg/dL    Comment: Glucose reference range applies only to samples taken after fasting for at least 8 hours.  Glucose, capillary     Status: Abnormal   Collection Time: 01/24/22  7:53 PM  Result Value Ref Range   Glucose-Capillary 146 (H) 70 - 99 mg/dL    Comment: Glucose reference range applies only to samples taken after fasting for at least 8 hours.  Glucose, capillary     Status: Abnormal   Collection Time: 01/24/22 11:47 PM  Result  Value Ref Range   Glucose-Capillary 199 (H) 70 - 99 mg/dL    Comment: Glucose reference range applies only to samples taken after fasting for at least 8 hours.  Glucose, capillary     Status: Abnormal   Collection Time: 01/25/22  3:34 AM  Result Value Ref Range   Glucose-Capillary 180 (H) 70 - 99 mg/dL    Comment: Glucose reference range applies only to samples taken after fasting for at least 8 hours.  Basic metabolic panel     Status: Abnormal   Collection Time: 01/25/22  4:30 AM  Result Value Ref Range   Sodium 141 135 - 145 mmol/L    Comment: LYTES REPEATED TO VERIFY RH   Potassium 3.6 3.5 - 5.1 mmol/L   Chloride 114 (H) 98 - 111 mmol/L   CO2 26 22 - 32 mmol/L   Glucose, Bld 180 (H) 70 - 99 mg/dL    Comment: Glucose reference range applies only to samples taken after fasting for at least 8 hours.   BUN 15 6 - 20 mg/dL   Creatinine, Ser 1.61 0.61 - 1.24 mg/dL   Calcium 7.6 (L) 8.9 - 10.3 mg/dL   GFR, Estimated >09 >60 mL/min    Comment: (NOTE) Calculated using the CKD-EPI Creatinine Equation (2021)    Anion gap 1 (L) 5 - 15    Comment: Performed at [facility], [address] [place], [place] [address]  Magnesium     Status: None   Collection Time: 01/25/22  4:30 AM  Result Value Ref Range   Magnesium 2.0 1.7 - 2.4 mg/dL    Comment: Performed at [facility], [address] [place], [place] [address]  Phosphorus     Status: None   Collection Time: 01/25/22  4:30 AM  Result Value Ref Range   Phosphorus 2.6 2.5 - 4.6 mg/dL    Comment: Performed at [facility], [address] [place], [place] [address]  CBC     Status: Abnormal   Collection Time: 01/25/22  4:30 AM  Result Value Ref Range   WBC 5.1 4.0 - 10.5 K/uL   RBC 2.73 (L) 4.22 - 5.81 MIL/uL   Hemoglobin 7.8 (L) 13.0 - 17.0 g/dL   HCT 56.2 (L) 13.0 - 86.5 %   MCV 90.5 80.0 - 100.0 fL   MCH 28.6 26.0 - 34.0 pg   MCHC 31.6 30.0 - 36.0 g/dL   RDW 78.4 69.6 - 29.5 %   Platelets 45  (L) 150 - 400 K/uL    Comment: Immature Platelet Fraction may be clinically indicated, consider ordering this additional test [id number] PLATELET COUNT CONFIRMED BY SMEAR    nRBC 0.0 0.0 - 0.2 %    Comment: Performed at [facility], [address] [place], [place] [address]  Haptoglobin     Status: None   Collection Time: 01/25/22  6:34 AM  Result Value Ref Range   Haptoglobin 200 29 - 370 mg/dL    Comment: (NOTE) Performed At: [facility] [address] [place], [place] [id number] Jolene Schimke MD [id number]   Fibrinogen     Status: None   Collection Time: 01/25/22  6:34 AM  Result Value Ref Range   Fibrinogen 422 210 - 475 mg/dL    Comment: (NOTE) Fibrinogen results may be underestimated in patients receiving thrombolytic therapy. Performed at [facility], [address] [place], [place] [address]   Lactate dehydrogenase     Status: None   Collection Time: 01/25/22  6:34 AM  Result Value Ref Range   LDH 119 98 - 192 U/L    Comment: Performed at [facility], [address] [place], [place] [address]  Bilirubin, direct     Status: None   Collection Time: 01/25/22  6:34 AM  Result Value Ref Range   Bilirubin, Direct 0.1 0.0 - 0.2 mg/dL    Comment: Performed at [facility], [address] [place], [place] [address]  Bilirubin, total     Status: None   Collection Time: 01/25/22  6:34 AM  Result Value Ref Range   Total Bilirubin 0.6 0.3 - 1.2 mg/dL    Comment: Performed at [facility], [address] [place], [place] [address]  D-dimer, quantitative     Status: Abnormal   Collection Time: 01/25/22  6:34 AM  Result Value Ref Range   D-Dimer, Quant 1.82 (H) 0.00 - 0.50 ug/mL-FEU    Comment: (NOTE) At the manufacturer cut-off value of 0.5 g/mL FEU, this assay has a negative predictive value of 95-100%.This assay is intended for use in conjunction with a clinical pretest probability (PTP)  assessment model to exclude pulmonary embolism (PE) and deep venous thrombosis (DVT) in outpatients suspected of PE or DVT. Results should be correlated with clinical presentation. Performed at [facility], [address] [place], [place] [address]   Technologist smear review     Status: None   Collection Time: 01/25/22  6:34 AM  Result Value Ref Range   WBC MORPHOLOGY MORPHOLOGY UNREMARKABLE    RBC MORPHOLOGY MORPHOLOGY UNREMARKABLE    Plt Morphology Normal platelet morphology    Clinical Information acute thrombocytopenia     Comment: Performed at [facility], [address] [place], [place] [address]  Reticulocytes     Status: Abnormal   Collection Time: 01/25/22  6:36 AM  Result Value Ref Range   Retic Ct Pct 0.7 0.4 - 3.1 %   RBC. 2.80 (L) 4.22 - 5.81 MIL/uL   Retic Count, Absolute 19.6 19.0 - 186.0 K/uL   Immature Retic Fract 1.9 (L) 2.3 - 15.9 %    Comment: Performed at [facility], [address] [place], [place] [address]  CBC with Differential/Platelet     Status: Abnormal   Collection Time: 01/25/22  7:00 AM  Result Value Ref Range   WBC 4.9 4.0 - 10.5 K/uL   RBC 2.79 (L) 4.22 - 5.81 MIL/uL   Hemoglobin 7.9 (L) 13.0 - 17.0 g/dL   HCT 77.4 (L) 14.2 - 39.5 %   MCV 91.4 80.0 - 100.0 fL   MCH 28.3 26.0 - 34.0 pg   MCHC 31.0 30.0 - 36.0 g/dL   RDW 32.0 23.3 - 43.5 %   Platelets 43 (  L) 150 - 400 K/uL    Comment: Immature Platelet Fraction may be clinically indicated, consider ordering this additional test [id number]    nRBC 0.0 0.0 - 0.2 %   Neutrophils Relative % 71 %   Neutro Abs 3.5 1.7 - 7.7 K/uL   Lymphocytes Relative 22 %   Lymphs Abs 1.1 0.7 - 4.0 K/uL   Monocytes Relative 6 %   Monocytes Absolute 0.3 0.1 - 1.0 K/uL   Eosinophils Relative 1 %   Eosinophils Absolute 0.1 0.0 - 0.5 K/uL   Basophils Relative 0 %   Basophils Absolute 0.0 0.0 - 0.1 K/uL   Immature Granulocytes 0 %   Abs Immature Granulocytes 0.02 0.00 - 0.07  K/uL    Comment: Performed at [facility], [address] [place], [place] [address]  Glucose, capillary     Status: Abnormal   Collection Time: 01/25/22  7:57 AM  Result Value Ref Range   Glucose-Capillary 131 (H) 70 - 99 mg/dL    Comment: Glucose reference range applies only to samples taken after fasting for at least 8 hours.  DAT, polyspecific, AHG ([facility] only)     Status: None   Collection Time: 01/25/22  9:38 AM  Result Value Ref Range   Polyspecific AHG test      NEG Performed at [facility], [address] [place], [place] [address]   Glucose, capillary     Status: Abnormal   Collection Time: 01/25/22 12:53 PM  Result Value Ref Range   Glucose-Capillary 167 (H) 70 - 99 mg/dL    Comment: Glucose reference range applies only to samples taken after fasting for at least 8 hours.  Sedimentation rate     Status: Abnormal   Collection Time: 01/25/22  1:22 PM  Result Value Ref Range   Sed Rate 70 (H) 0 - 20 mm/hr    Comment: Performed at [facility], [address] [place] [address] [place], [place] [address]  C-reactive protein     Status: Abnormal   Collection Time: 01/25/22  1:22 PM  Result Value Ref Range   CRP 5.1 (H) <1.0 mg/dL    Comment: Performed at [facility], [address] [place], [place] [address]  Glucose, capillary     Status: Abnormal   Collection Time: 01/25/22  3:47 PM  Result Value Ref Range   Glucose-Capillary 110 (H) 70 - 99 mg/dL    Comment: Glucose reference range applies only to samples taken after fasting for at least 8 hours.  Glucose, capillary     Status: Abnormal   Collection Time: 01/25/22  8:06 PM  Result Value Ref Range   Glucose-Capillary 183 (H) 70 - 99 mg/dL    Comment: Glucose reference range applies only to samples taken after fasting for at least 8 hours.  Glucose, capillary     Status: None   Collection Time: 01/26/22 12:39 AM  Result Value Ref Range   Glucose-Capillary 77 70 - 99 mg/dL    Comment: Glucose  reference range applies only to samples taken after fasting for at least 8 hours.  Triglycerides     Status: None   Collection Time: 01/26/22  4:14 AM  Result Value Ref Range   Triglycerides 79 <150 mg/dL    Comment: Performed at [facility], [address] [place], [place] [address]  Glucose, capillary     Status: None   Collection Time: 01/26/22  4:39 AM  Result Value Ref Range   Glucose-Capillary 83  70 - 99 mg/dL    Comment: Glucose reference range applies only to samples taken after fasting for at least 8 hours.  Glucose, capillary     Status: Abnormal   Collection Time: 01/26/22  8:05 AM  Result Value Ref Range   Glucose-Capillary 143 (H) 70 - 99 mg/dL    Comment: Glucose reference range applies only to samples taken after fasting for at least 8 hours.  Basic metabolic panel     Status: Abnormal   Collection Time: 01/26/22  8:09 AM  Result Value Ref Range   Sodium 138 135 - 145 mmol/L   Potassium 3.2 (L) 3.5 - 5.1 mmol/L   Chloride 105 98 - 111 mmol/L   CO2 26 22 - 32 mmol/L   Glucose, Bld 147 (H) 70 - 99 mg/dL    Comment: Glucose reference range applies only to samples taken after fasting for at least 8 hours.   BUN 18 6 - 20 mg/dL   Creatinine, Ser 4.09 0.61 - 1.24 mg/dL   Calcium 7.9 (L) 8.9 - 10.3 mg/dL   GFR, Estimated >81 >19 mL/min    Comment: (NOTE) Calculated using the CKD-EPI Creatinine Equation (2021)    Anion gap 7 5 - 15    Comment: Performed at [facility], [address] [place], [place] [address]  Magnesium     Status: None   Collection Time: 01/26/22  8:09 AM  Result Value Ref Range   Magnesium 1.8 1.7 - 2.4 mg/dL    Comment: Performed at [facility], [address] [place], [place] [address]  Phosphorus     Status: None   Collection Time: 01/26/22  8:09 AM  Result Value Ref Range   Phosphorus 2.6 2.5 - 4.6 mg/dL    Comment: Performed at [facility], [address] [place], [place] [address]  CBC with  Differential/Platelet     Status: Abnormal   Collection Time: 01/26/22  8:09 AM  Result Value Ref Range   WBC 7.2 4.0 - 10.5 K/uL   RBC 2.77 (L) 4.22 - 5.81 MIL/uL   Hemoglobin 7.8 (L) 13.0 - 17.0 g/dL   HCT 78.4 (L) 69.6 - 29.5 %   MCV 85.6 80.0 - 100.0 fL   MCH 28.2 26.0 - 34.0 pg   MCHC 32.9 30.0 - 36.0 g/dL   RDW 28.4 13.2 - 44.0 %   Platelets 45 (L) 150 - 400 K/uL    Comment: Immature Platelet Fraction may be clinically indicated, consider ordering this additional test [id number]    nRBC 0.0 0.0 - 0.2 %   Neutrophils Relative % 77 %   Neutro Abs 5.5 1.7 - 7.7 K/uL   Lymphocytes Relative 16 %   Lymphs Abs 1.1 0.7 - 4.0 K/uL   Monocytes Relative 6 %   Monocytes Absolute 0.4 0.1 - 1.0 K/uL   Eosinophils Relative 0 %   Eosinophils Absolute 0.0 0.0 - 0.5 K/uL   Basophils Relative 0 %   Basophils Absolute 0.0 0.0 - 0.1 K/uL   Immature Granulocytes 1 %   Abs Immature Granulocytes 0.05 0.00 - 0.07 K/uL    Comment: Performed at [facility], [address] [place], [place] [address]  Glucose, capillary     Status: Abnormal   Collection Time: 01/26/22 11:21 AM  Result Value Ref Range   Glucose-Capillary 177 (H) 70 - 99 mg/dL    Comment: Glucose reference range applies only to samples taken after fasting for at least 8  hours.    Current Facility-Administered Medications  Medication Dose Route Frequency Provider Last Rate Last Admin   0.9 %  sodium chloride infusion  250 mL Intravenous PRN Raechel Chute, MD       acetaminophen (TYLENOL) tablet 975 mg  975 mg Oral Q6H Dgayli, Khabib, MD   975 mg at 01/26/22 1049   ceFEPIme (MAXIPIME) 2 g in sodium chloride 0.9 % 100 mL IVPB  2 g Intravenous Q8H Ravishankar, Jayashree, MD 200 mL/hr at 01/26/22 0600 Infusion Verify at 01/26/22 0600   Chlorhexidine Gluconate Cloth 2 % PADS 6 each  6 each Topical Daily Salena Saner, MD   6 each at 01/26/22 0907   ciprofloxacin (CIPRO) IVPB 400 mg  400 mg Intravenous Q12H Lynn Ito, MD 200 mL/hr at 01/26/22 0914 400 mg at 01/26/22 0914   docusate sodium (COLACE) capsule 100 mg  100 mg Oral BID PRN Rust-Chester, Cecelia Byars, NP       fondaparinux (ARIXTRA) injection 2.5 mg  2.5 mg Subcutaneous Q24H Milon Dikes, MD   2.5 mg at 01/25/22 2210   insulin aspart (novoLOG) injection 0-15 Units  0-15 Units Subcutaneous Q4H Salena Saner, MD   2 Units at 01/26/22 0908   insulin glargine-yfgn (SEMGLEE) injection 10 Units  10 Units Subcutaneous QHS Salena Saner, MD   10 Units at 01/25/22 2210   ipratropium-albuterol (DUONEB) 0.5-2.5 (3) MG/3ML nebulizer solution 3 mL  3 mL Nebulization Q6H PRN Raechel Chute, MD       lidocaine (LIDODERM) 5 % 1 patch  1 patch Transdermal Q24H Beers, Brandon D, RPH       multivitamin with minerals tablet 1 tablet  1 tablet Oral Daily Raechel Chute, MD   1 tablet at 01/26/22 0865   oxyCODONE (Oxy IR/ROXICODONE) immediate release tablet 5 mg  5 mg Oral Q6H Beers, Brandon D, RPH   5 mg at 01/26/22 1050   pantoprazole (PROTONIX) EC tablet 40 mg  40 mg Oral Daily Dgayli, Lianne Bushy, MD   40 mg at 01/26/22 0907   potassium chloride (KLOR-CON) packet 40 mEq  40 mEq Oral Q4H BeersSherlynn Carbon, RPH   40 mEq at 01/26/22 1050   sodium chloride flush (NS) 0.9 % injection 3 mL  3 mL Intravenous Q12H Dgayli, Lianne Bushy, MD   3 mL at 01/26/22 0908   sodium chloride flush (NS) 0.9 % injection 3 mL  3 mL Intravenous PRN Raechel Chute, MD       traZODone (DESYREL) tablet 50 mg  50 mg Oral QHS PRN Jimmye Norman, NP   50 mg at 01/25/22 2220    Musculoskeletal: Strength & Muscle Tone: decreased Gait & Station:  did not witness Patient leans: N/A  Psychiatric Specialty Exam: Physical Exam Vitals and nursing note reviewed.  Constitutional:      Appearance: Normal appearance.  HENT:     Head: Normocephalic.     Nose: Nose normal.  Pulmonary:     Effort: Pulmonary effort is normal.  Musculoskeletal:        General: Normal range of motion.      Cervical back: Normal range of motion.  Neurological:     General: No focal deficit present.     Mental Status: He is alert and oriented to person, place, and time.  Psychiatric:        Attention and Perception: Attention and perception normal.        Mood and Affect: Mood and affect normal.  Speech: Speech normal.        Behavior: Behavior normal. Behavior is cooperative.        Thought Content: Thought content normal.        Cognition and Memory: Cognition and memory normal.        Judgment: Judgment normal.     Review of Systems  Constitutional:  Positive for malaise/fatigue.  Psychiatric/Behavioral:  Positive for substance abuse.   All other systems reviewed and are negative.   Blood pressure 118/70, pulse 89, temperature 98.2 F (36.8 C), temperature source Oral, resp. rate (!) 29, weight 53.8 kg, SpO2 100 %.Body mass index is 17.98 kg/m.  General Appearance: Casual  Eye Contact:  Good  Speech:  Normal Rate  Volume:  Normal  Mood:  Euthymic  Affect:  Congruent  Thought Process:  Coherent  Orientation:  Full (Time, Place, and Person)  Thought Content:  WDL and Logical  Suicidal Thoughts:  No  Homicidal Thoughts:  No  Memory:  Immediate;   Fair Recent;   Fair Remote;   Fair  Judgement:  Fair  Insight:  Fair  Psychomotor Activity:  Decreased  Concentration:  Concentration: Good and Attention Span: Good  Recall:  Good  Fund of Knowledge:  Fair  Language:  Good  Akathisia:  No  Handed:  Right  AIMS (if indicated):     Assets:  Leisure Time Resilience  ADL's:  Impaired  Cognition:  WNL  Sleep:        Physical Exam: Physical Exam Vitals and nursing note reviewed.  Constitutional:      Appearance: Normal appearance.  HENT:     Head: Normocephalic.     Nose: Nose normal.  Pulmonary:     Effort: Pulmonary effort is normal.  Musculoskeletal:        General: Normal range of motion.     Cervical back: Normal range of motion.  Neurological:      General: No focal deficit present.     Mental Status: He is alert and oriented to person, place, and time.  Psychiatric:        Attention and Perception: Attention and perception normal.        Mood and Affect: Mood and affect normal.        Speech: Speech normal.        Behavior: Behavior normal. Behavior is cooperative.        Thought Content: Thought content normal.        Cognition and Memory: Cognition and memory normal.        Judgment: Judgment normal.    Review of Systems  Constitutional:  Positive for malaise/fatigue.  Psychiatric/Behavioral:  Positive for substance abuse.   All other systems reviewed and are negative.  Blood pressure 118/70, pulse 89, temperature 98.2 F (36.8 C), temperature source Oral, resp. rate (!) 29, weight 53.8 kg, SpO2 100 %. Body mass index is 17.98 kg/m.  Treatment Plan Summary: Polysubstance dependency: Methadone clinical resources in discharge instructions  Agitation:  Haldol 2 mg IV every 6 hours PRN  Disposition: No evidence of imminent risk to self or others at present.   Patient does not meet criteria for psychiatric inpatient admission.  Nanine Means, NP 01/26/2022 12:35 PM

## 2022-01-26 NOTE — Progress Notes (Signed)
NAME:  Earl Moreno, MRN:  [id number], DOB:  1966-04-01, LOS: 4 ADMISSION DATE:  01/21/2022  History of Present Illness:  56 yo M presenting to [facility] ED on 01/21/22 via EMS complaining that he does not feel well. History provided by ED documentation as there is no family available and patient intubated and sedated. EMS reported that they think he has a son who called 911.  Per chart review patient with multiple admissions for sepsis including pyelonephritis, GN bacteremia & suspected osteomyelitis in his right hip as recently as 01/15/22. There have been multiple attempts to treat with IV antibiotics & perform a TEE and rule out endocarditis but the patient continues to leave AMA.   ED course: Upon arrival patient combative asking for help but then telling staff to leave I'm alone. Patient febrile & tachycardic. Sepsis protocol initiated but hindered by the fact that the patient was so uncooperative. Patient received Ativan and Haldol in order to restrain him enough to place a PIV. Lab work significant for lactic acidosis, leukocytosis with a left shift & an elevated PCT. Mild hyponatremia, hyperglycemia & an elevated troponin. UDS positive for cocaine & opiate. CT head concerning for possible subacute cortical infarct. Precedex drip was started, but even at max the patient still thrashing in the bed.  Medications given: acetaminophen, cefepime & vancomycin, Geodon, ativan, haldol, etomidate & rocuronium, 2 L of LR Initial Vitals: 100.5, 20, 111, 116/68 & spO2 98 % on RA Significant labs: (Labs/ Imaging personally reviewed) EKG Interpretation: Date: 01/22/22, EKG Time: 02:30, Rate: 120, Rhythm: ST, QRS Axis:  LAD, Intervals: normal, ST/T Wave abnormalities: none, Narrative Interpretation: ST Chemistry: Na+: 129 (when corrected for glucose- 134, K+: 3.5, BUN/Cr.: 9/0.80, Serum CO2/ AG: 24/ 15 Hematology: WBC: 14.0, Hgb: 12.4,  Troponin: 89, CK: 207, Lactic/ PCT: 5.1/ 2.98, COVID-19 & Influenza A/B:  negative UDS: +cocaine & +opiates ABG: 7.55/ 30/ 157/ 26.2   CXR 01/22/22: no acute cardiopulmonary abnormalities CT head wo contrast 01/22/22: Focal loss of gray-white matter differentiation within the right parietal cortex which may reflect a subacute cortical infarct, though this is not well characterized on this examination. No associated mass effect or midline shift. Contrast enhanced MRI examination is recommended for further evaluation CT chest/abdomen/pelvis w contrast 01/22/22: No acute or inflammatory process identified in the chest. Satisfactory endotracheal and enteric tubes. Abdomen and pelvis remarkable for:- abnormal but nonspecific small to moderate volume of simple appearing free fluid in the pelvis. Paucity of intra-abdominal fat limits bowel detail, but no bowel obstruction or inflammation is evident.- small hypoenhancing areas in the superior spleen, with underlying borderline splenomegaly. Although nonspecific consider small splenic infarcts or hematogenous infection in this setting.- focal bone erosion of the posteroinferior right pubic ramus since last July. But no other lytic osseous lesion identified, and no regional inflammation. This would be an unusual site for osteomyelitis. Query a metabolic (such as parathyroid) bone disorder. Metastatic disease also not excluded.  Pertinent  Medical History  Heroin/cocaine abuse T2DM Splenic infarct GN sepsis Hyponatremia Homeless  Significant Hospital Events: Including procedures, antibiotic start and stop dates in addition to other pertinent events   01/22/22: admit to ICU after intubation and mechanical ventilation due to AMS and sedative administration as well as severe sepsis s/t suspected osteomyelitis 01/22/22: Echocardiogram confirms large aortic valve vegetation, patient was septic shock being stabilized, MRI brain with small areas of diffusion abnormality right greater than left parieto-occipital regions consistent with evolving  subacute ischemic infarcts, short interval follow-up MRI  suggested.  MR pelvis osteomyelitis the site of the right hamstring tendon origin possible myositis, bilateral iliopsoas bursitis 01/23/22: Discussed with cardiology for potential TEE however, the recommendation was for transfer to [facility] for TEE and cardiothoracic surgery evaluation.  01/24/22: HITT antibodies sent. Failed SBT due to tachypnea 01/25/22: Extubated to nasal cannula.  Interim History / Subjective:    Objective   Blood pressure 107/64, pulse 78, temperature 98.2 F (36.8 C), temperature source Oral, resp. rate (!) 21, weight 53.8 kg, SpO2 100 %.    Vent Mode: PSV;CPAP FiO2 (%):  [28 %] 28 % Set Rate:  [10 bmp] 10 bmp Vt Set:  [470 mL] 470 mL PEEP:  [5 cmH20] 5 cmH20 Pressure Support:  [5 cmH20] 5 cmH20 Plateau Pressure:  [8 cmH20] 8 cmH20   Intake/Output Summary (Last 24 hours) at 01/26/2022 0748 Last data filed at 01/26/2022 0600 Gross per 24 hour  Intake 1554.81 ml  Output 3751 ml  Net -2196.19 ml    Filed Weights   01/25/22 0500 01/26/22 0500  Weight: 53.2 kg 53.8 kg    Examination: Physical Exam Constitutional:      General: He is not in acute distress.    Appearance: He is ill-appearing. He is not toxic-appearing.  HENT:     Head: Normocephalic.     Mouth/Throat:     Mouth: Mucous membranes are moist.  Eyes:     Conjunctiva/sclera: Conjunctivae normal.     Pupils: Pupils are equal, round, and reactive to light.  Cardiovascular:     Rate and Rhythm: Normal rate and regular rhythm.     Pulses: Normal pulses.     Heart sounds: Murmur heard.  Pulmonary:     Effort: Pulmonary effort is normal.     Breath sounds: Normal breath sounds. No wheezing.  Abdominal:     General: Abdomen is flat.     Palpations: Abdomen is soft.  Genitourinary:    Comments: Foley in place Musculoskeletal:     Cervical back: Normal range of motion and neck supple.  Neurological:     General: No focal deficit present.      Mental Status: He is oriented to person, place, and time.     Resolved Hospital Problem list     Assessment & Plan:   73 undomiciled male with history of IVDU and osteomyelitis presenting to the hospital with septic shock found to have Serratia bacteremia and aortic valve endocarditis.  #septic shock #Left sided endocarditis #Aortic valve vegetation #Serratia Marcecens bacteremia #osteomyelitis in the RIGHT hip  Initial interventions/workup included volume resuscitation and IV antibiotics. Blood cultures have since grown Serratia Marcescens. Risks for surgery outweigh benefits per surgeons at [facility]. Will continue supportive therapy. ID consulted and he is on double therapy with Cefepime and Ciprofloxacin.  - ID consult appreciated - Daily CBC, monitor WBC/ fever curve - IV antibiotics: Cefepime and Ciprofloxacin, day 1 of dual coverage 01/24/2022 - [facility] critical care and cardiothoracic team consulted, not a candidate for surgery.   #Aortic valve endocarditis #Serratia marcecens bacteremia #Heart Failure with mid range Ejection Fraction #Acute Systolic Heart Failure   Cardiothoracic surgery consulted (documented by Dr. Patsey Berthold 9/4) and conversation had regarding possibility of AVR. Patient very high risk and risks deemed to outweigh the benefits of AVR. Declined as a candidate for surgery and recommended medical management. Will continue supportive management with antibiotics and repeat echocardiography in the future as he clinically improves.   - Continue antibiotics, ID consult appreciated -  Ischemic infarcts, osteomyelitis - Likely benefit from a TEE to evaluate for aortic root abscess in the future, unclear if will change direct management in re to surgery.   #Thrombocytopenia  Precipitous drop in platelet count. Previous exposure to heparin products. High 4T score and high risk for HITT. Will initiate fondaparinux and send HITT panel. SRA if HITT ab positive.  Other differentials include bone marrow suppression from sepsis/endocarditis and DIC.  -monitor platelets daily -follow up HITT panel -hemolysis workup -DIC workup -hold DVT prophylaxis if platelets <07  #Toxic Metabolic Encephalopathy #Acute Hypoxic Respiratory Failure ruled out, Intubated for airway protection  Extubated successfully and now on nasal cannula  - Intermittent chest x-ray & ABG PRN - Ensure adequate pulmonary hygiene  - neuro checks q4 - MRI brain with subacute ischemic infarcts   #Uncontrolled Type 2 Diabetes Mellitus Hemoglobin A1C: 13.5 (11/2021) - Monitor CBG Q 4 hour - basal bolus regimen - SSI moderate dosing, will start glargine insulin 10 units nightly + sliding scale - target range while in ICU: 140-180 - Diabetes Coordinator following, appreciate recommendations   #Elevated Troponin secondary to demand ischemia - Mild trop elevation in the setting of endocarditis and HFrEF - continuous cardiac monitoring - will need TEE given endocarditis to r/o root abscess. Will not change surgical risk   #Severe protein calorie malnutrition - Poor nutritional status in the setting of substance abuse, chronic illness - regular diet   Systems based checklist:   Neuro: septic emboli to brain. Toxic metabolic encephalopathy resolving now that he is off sedation Cardiovascular: Aortic valve abscess with HFrEF secondary to AI and septic emboli. High risk for surgery, deemed poor surgical candidate by CT surgery. Unfortunately poor prognosis. Continue with supportive care and antibiotics. Pulmonary: Extubated to nasal cannula GI: regular diet Endo: Basal bolus, goal glucose 140-180 Renal: Kidney function at baseline. Replete electrolytes PRN Hem/Onc: Thrombocytopenia, suspect marrow suppression from sepsis and endocarditis. HITT panel sent and pending, fondaparinux for prophylaxis ID: ID consult for serratia bacteremia and endocarditis. Continue Cefepime and  Ciprofloxacin  Best Practice (right click and "Reselect all SmartList Selections" daily)   Diet/type: Regular consistency (see orders) DVT prophylaxis: other (fondaparinux) GI prophylaxis: PPI Lines: Central line Foley:  Yes, and it is still needed Code Status:  DNR Last date of multidisciplinary goals of care discussion [01/26/2022]  Labs   CBC: Recent Labs  Lab 01/22/22 0255 01/23/22 0519 01/24/22 0321 01/25/22 0430 01/25/22 0700  WBC 14.0* 16.0* 8.4 5.1 4.9  NEUTROABS 12.9*  --   --   --  3.5  HGB 12.4* 8.8* 8.8* 7.8* 7.9*  HCT 38.2* 26.6* 27.5* 24.7* 25.5*  MCV 87.2 85.8 87.6 90.5 91.4  PLT 250 106* 62* 45* 43*     Basic Metabolic Panel: Recent Labs  Lab 01/22/22 0255 01/22/22 0520 01/22/22 2044 01/23/22 0519 01/24/22 0321 01/25/22 0430  NA 129*  --  135 138 141 141  K 3.5  --  2.3* 3.2* 3.1* 3.6  CL 90*  --   --  107 113* 114*  CO2 24  --   --  24 25 26   GLUCOSE 304*  --   --  111* 175* 180*  BUN 9  --   --  11 15 15   CREATININE 0.80  --   --  0.66 0.73 0.65  CALCIUM 8.4*  --   --  7.4* 7.9* 7.6*  MG  --  1.7 1.9 1.8 2.2 2.0  PHOS  --  2.1* 3.2 2.0*  2.6 2.6    GFR: Estimated Creatinine Clearance: 77.5 mL/min (by C-G formula based on SCr of 0.65 mg/dL). Recent Labs  Lab 01/22/22 0255 01/22/22 0855 01/22/22 2044 01/23/22 0519 01/24/22 0321 01/25/22 0430 01/25/22 0700  PROCALCITON 2.98  --   --   --   --   --   --   WBC 14.0*  --   --  16.0* 8.4 5.1 4.9  LATICACIDVEN 5.1* 4.9* 1.8  --   --   --   --      Liver Function Tests: Recent Labs  Lab 01/22/22 0255 01/23/22 0519 01/24/22 0321 01/25/22 0634  AST 33  --  18  --   ALT 14  --  10  --   ALKPHOS 117  --  55  --   BILITOT 1.1  --  0.7 0.6  PROT 9.0*  --  6.4*  --   ALBUMIN 2.8* 2.3* 2.8*  --     No results for input(s): "LIPASE", "AMYLASE" in the last 168 hours. Recent Labs  Lab 01/22/22 0255  AMMONIA 16     ABG    Component Value Date/Time   PHART 7.5 (H) 01/23/2022 1040    PCO2ART 31 (L) 01/23/2022 1040   PO2ART 100 01/23/2022 1040   HCO3 24.2 01/23/2022 1040   O2SAT 99.8 01/23/2022 1040     Coagulation Profile: Recent Labs  Lab 01/22/22 0255  INR 1.3*     Cardiac Enzymes: Recent Labs  Lab 01/22/22 0255 01/23/22 0519  CKTOTAL 207 47*     HbA1C: Hgb A1c MFr Bld  Date/Time Value Ref Range Status  11/29/2021 02:02 PM 13.2 (H) 4.8 - 5.6 % Final    Comment:    (NOTE) Pre diabetes:          5.7%-6.4%  Diabetes:              >6.4%  Glycemic control for   <7.0% adults with diabetes   10/19/2020 04:44 PM 14.4 (H) 4.8 - 5.6 % Final    Comment:    (NOTE)         Prediabetes: 5.7 - 6.4         Diabetes: >6.4         Glycemic control for adults with diabetes: <7.0     CBG: Recent Labs  Lab 01/25/22 1253 01/25/22 1547 01/25/22 2006 01/26/22 0039 01/26/22 0439  GLUCAP 167* 110* 183* 77 83     Review of Systems:   Unable to obtain  Past Medical History:  He,  has a past medical history of Diabetes mellitus without complication ([place]).   Surgical History:   Past Surgical History:  Procedure Laterality Date   I & D EXTREMITY Left 06/05/2018   Procedure: IRRIGATION AND DEBRIDEMENT EXTREMITY-LEFT HAND;  Surgeon: Lovell Sheehan, MD;  Location: [facility] ORS;  Service: Orthopedics;  Laterality: Left;     Social History:   reports that he has been smoking. He has been smoking an average of .5 packs per day. He has never used smokeless tobacco. He reports current drug use. Drugs: Cocaine and IV. He reports that he does not drink alcohol.   Family History:  His family history includes Diabetes in his mother.   Allergies Allergies  Allergen Reactions   Heparin     Undergoing HITT w/u follow pending PF4 & SRA result     Home Medications  Prior to Admission medications   Medication Sig Start Date End Date Taking? Authorizing Provider  amLODipine (NORVASC) 10 MG tablet Take 1 tablet (10 mg total) by mouth daily. 12/02/21  Yes  Barb Merino, MD  Baclofen 5 MG TABS Take 5 mg by mouth 3 (three) times daily as needed for muscle spasms. 12/02/21  Yes Ghimire, Dante Gang, MD  Blood Glucose Monitoring Suppl (BLOOD GLUCOSE MONITOR SYSTEM) w/Device KIT USE UP TO 4 TIMES DAILY AS DIRECTED. 02/26/21  Yes [name], MD  Blood Glucose Monitoring Suppl (RIGHTEST [id number] BLOOD GLUCOSE) w/Device KIT Use as directed to test blood sugar up to 4 times daily 12/02/21  Yes Ghimire, Dante Gang, MD  Insulin Lispro Prot & Lispro (HUMALOG MIX 75/25 KWIKPEN) (75-25) 100 UNIT/ML Kwikpen Inject 18 Units into the skin 2 (two) times daily. 12/02/21  Yes Ghimire, Dante Gang, MD  Insulin Pen Needle (COMFORT EZ PEN NEEDLES) 32G X 4 MM MISC USE AS DIRECTED TWICE DAILY (MORNING AND BEDTIME) 12/02/21  Yes Barb Merino, MD  Rightest GL300 Lancets MISC USE AS DIRECTED UP TO 4 TIMES DAILY. 12/02/21  Yes Barb Merino, MD  glucose blood (RIGHTEST [phone] BLOOD GLUCOSE) test strip Use as directed to check blood sugar up to 4 times daily 12/02/21   Barb Merino, MD  levofloxacin (LEVAQUIN) 750 MG tablet Take 750 mg by mouth daily. Patient not taking: Reported on 01/22/2022 01/12/22 01/26/22  [provider]     Critical care time: 44 minutes

## 2022-01-26 NOTE — Progress Notes (Signed)
PHARMACY CONSULT NOTE  Pharmacy Consult for Electrolyte Monitoring and Replacement   Recent Labs: Potassium (mmol/L)  Date Value  01/26/2022 3.2 (L)  01/19/2013 3.6   Magnesium (mg/dL)  Date Value  28/31/5176 1.8   Calcium (mg/dL)  Date Value  16/11/3708 7.9 (L)   Calcium, Total (mg/dL)  Date Value  62/69/4854 8.7   Albumin (g/dL)  Date Value  62/70/3500 2.8 (L)  12/30/2012 3.9   Phosphorus (mg/dL)  Date Value  93/81/8299 2.6   Sodium (mmol/L)  Date Value  01/26/2022 138  01/19/2013 140     Assessment: 56 y.o. male who complains of right-sided low back pain dysuria and frequency.  Per chart review patient with multiple admissions for sepsis including pyelonephritis, GN bacteremia & suspected osteomyelitis in his right hip as recently as 01/15/22. There have been multiple attempts to treat with IV antibiotics & perform a TEE and rule out endocarditis but the patient continues to leave AMA. Pharmacy is asked to follow and replace electrolytes while in CCU  Nutrition: FWF 30 q4hs, TF @55ml /h (~1333ml/d), NS KVO  Goal of Therapy:  Electrolytes WNL  Plan:  Scr stable; Net +3.8L; UOP 1.7>2.8ml/k/h K: 3.6>3.2: Replete with 10m PO and q1h x4 doses based on prior response and inc'd UOP Mg 2>1.8: Remains WNL, no further repletion at this time.  Phos 2.6>2.6: Remains WNL, no further repletion at this time. Recheck electrolytes in am  ,PharmD Clinical Pharmacist 01/26/2022 9:51 AM

## 2022-01-26 NOTE — Progress Notes (Signed)
   Date of Admission:  01/21/2022     ID: Earl Moreno is a 56 y.o. 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)   Altered mental status   Gram-negative bacteremia   Aortic valve endocarditis   Acute HFrEF (heart failure with reduced ejection fraction) (HCC)    Subjective: Awake and alert C/o pain rt hip and wants more pain meds   Medications:   acetaminophen  975 mg Oral Q6H   Chlorhexidine Gluconate Cloth  6 each Topical Daily   feeding supplement  237 mL Oral TID BM   fondaparinux (ARIXTRA) injection  2.5 mg Subcutaneous Q24H   insulin aspart  0-15 Units Subcutaneous Q4H   insulin glargine-yfgn  10 Units Subcutaneous QHS   lidocaine  1 patch Transdermal Q24H   multivitamin with minerals  1 tablet Oral Daily   oxyCODONE  5 mg Oral Q6H   pantoprazole  40 mg Oral Daily   potassium chloride  40 mEq Oral Q4H   sodium chloride flush  3 mL Intravenous Q12H    Objective: Vital signs in last 24 hours: Temp:  [98.2 F (36.8 C)-99.1 F (37.3 C)] 98.2 F (36.8 C) (09/07 0400) Pulse Rate:  [78-103] 89 (09/07 1100) Resp:  [17-36] 29 (09/07 1100) BP: (93-132)/(54-80) 118/70 (09/07 1100) SpO2:  [97 %-100 %] 100 % (09/07 1100) Weight:  [53.8 kg] 53.8 kg (09/07 0500)  LDA Left IJ foley  PHYSICAL EXAM:  General: Awake, alert, emaciated Lungs: b/l air entry Heart: s1s2 Abdomen: Soft, non-tender,not distended. Bowel sounds normal. No masses Extremities: atraumatic, no cyanosis. No edema. No clubbing Skin: No rashes or lesions. Or bruising Lymph: Cervical, supraclavicular normal. Neurologic: Grossly non-focal  Lab Results Recent Labs    01/25/22 0430 01/25/22 0700 01/26/22 0809  WBC 5.1 4.9 7.2  HGB 7.8* 7.9* 7.8*  HCT 24.7* 25.5* 23.7*  NA 141  --  138  K 3.6  --  3.2*  CL 114*  --  105  CO2 26  --  26  BUN 15  --  18  CREATININE 0.65  --  0.63   Liver  Panel Recent Labs    01/24/22 0321 01/25/22 0634  PROT 6.4*  --   ALBUMIN 2.8*  --   AST 18  --   ALT 10  --   ALKPHOS 55  --   BILITOT 0.7 0.6  BILIDIR  --  0.1    Microbiology: 01/22/22 - serratia 01/26/22 BC sent Studies/Results: No results found.   Assessment/Plan: Encephalopathy secondary to sepsis and infarct ? Acute hypoxic resp failure- intubated. SBT in process   Serratia bacteremia with aortic valve endocarditis On dual coverage with cefepime + cipro Repeat blood culture  IVDA   Osteomyelitis rt pubic ramus Previous bacteremias including serratia , enterobacter- not treated because of him leaving AMA ? _Anemia   Thrombocytopenia  Patient needs adequate pain control so that he wont leave AMA  Appreciate Dr.Clapacs consult- started on methadone Discussed the management with patient and intensivist

## 2022-01-26 NOTE — Consult Note (Signed)
Consultation Note Date: 01/26/2022   Patient Name: Earl Moreno  DOB: [birth year]  MRN: [id number]  Age / Sex: 56 y.o., male  PCP: Pcp, No Referring Physician: Armando Reichert, MD  Reason for Consultation: Establishing goals of care  HPI/Patient Profile: 56 y.o. male  with past medical history of multiple admissions for sepsis including pyelonephritis, GN bacteremia and suspected osteomyelitis in his right hip as recently as 01/15/2022 with multiple attempts to treat with IV antibiotics but patient continues to leave AMA, history of heroin abuse, DM 2, splenic infarct, admitted on 01/21/2022 with septic shock initially requiring intubation/ventilation.   Clinical Assessment and Goals of Care: I have reviewed medical records including EPIC notes, labs and imaging, received report from RN, assessed the patient.  Earl Moreno, "Hillbilly" is sitting up quietly in bed.  He is eating fried chicken with no overt signs and symptoms of aspiration.  He greets me, making and somewhat keeping eye contact.  He appears acutely ill/chronically ill and and frail.  He is alert and oriented x3, able to make his basic needs known.  There is no family present at this time, but a [employer] cabin crew is present.  We meet at bedside to discuss diagnosis prognosis, GOC, EOL wishes, disposition and options. I introduced Palliative Medicine as specialized medical care for people living with serious illness. It focuses on providing relief from the symptoms and stress of a serious illness. The goal is to improve quality of life for both the patient and the family.  We discussed a brief life review of the patient.  States he tells me that he has been married to [employer] for 13 years.  He tells me that she is currently in the hospital.  He tells me that he never knew his father, he died at age 11.  He also shares that he recently lost his  mother.  He tells me that he is homeless, living in his car.  We then focused on their current illness.  We talked about his acute health concerns and the treatment plan.  At this point states he tells me that he would like to go to inpatient drug rehab.  I shared that our team will work to help him achieve this if possible.  The natural disease trajectory and expectations at EOL were discussed.  Advanced directives, concepts specific to code status, artifical feeding and hydration, and rehospitalization were considered and discussed.  States he tells me that he would like to continue to "treat the treatable, but allowing natural passing"/DNR.  Discussed the importance of continued conversation with family and the medical providers regarding overall plan of care and treatment options, ensuring decisions are within the context of the patient's values and GOCs.  Questions and concerns were addressed.  The patient was encouraged to call with questions or concerns.  PMT will continue to support holistically.  Conference with CCM attending, CCM NP related to patient condition, needs, goals of care.   HCPOA NEXT OF KIN -states he states that his  wife of 13 years, Earl Moreno, would be his healthcare surrogate.  He states that she is currently in the hospital, therefore he names his son, Earl Moreno, as his healthcare surrogate.    SUMMARY OF RECOMMENDATIONS   At this point continue to treat the treatable but no CPR or intubation Time for outcomes At this point states he would like inpatient drug rehab   Code Status/Advance Care Planning: DNR  Symptom Management:  Per hospitalist/CCM, no additional needs at this time.  Palliative Prophylaxis:  Frequent Pain Assessment and Oral Care  Additional Recommendations (Limitations, Scope, Preferences): Treat the treatable but no CPR or intubation.  Psycho-social/Spiritual:  Desire for further Chaplaincy support:no Additional Recommendations:  Caregiving  Support/Resources  Prognosis:  Unable to determine, based on outcomes.  Discharge Planning: States that he is willing to go to inpatient drug rehab      Primary Diagnoses: Present on Admission:  Aortic valve endocarditis  Acute HFrEF (heart failure with reduced ejection fraction) ([place])   I have reviewed the medical record, interviewed the patient and family, and examined the patient. The following aspects are pertinent.  Past Medical History:  Diagnosis Date   Diabetes mellitus without complication ([place])    Social History   Socioeconomic History   Marital status: Married    Spouse name: Not on file   Number of children: Not on file   Years of education: Not on file   Highest education level: Not on file  Occupational History   Not on file  Tobacco Use   Smoking status: Every Day    Packs/day: 0.50    Types: Cigarettes   Smokeless tobacco: Never  Substance and Sexual Activity   Alcohol use: No   Drug use: Yes    Types: Cocaine, IV    Comment: Heroin   Sexual activity: Not on file    Comment: occasionally  Other Topics Concern   Not on file  Social History Narrative   Not on file   Social Determinants of Health   Financial Resource Strain: Not on file  Food Insecurity: Not on file  Transportation Needs: Not on file  Physical Activity: Not on file  Stress: Not on file  Social Connections: Not on file   Family History  Problem Relation Age of Onset   Diabetes Mother    Scheduled Meds:  acetaminophen  975 mg Oral Q6H   Chlorhexidine Gluconate Cloth  6 each Topical Daily   feeding supplement  237 mL Oral TID BM   fondaparinux (ARIXTRA) injection  2.5 mg Subcutaneous Q24H   insulin aspart  0-15 Units Subcutaneous Q4H   insulin glargine-yfgn  10 Units Subcutaneous QHS   lidocaine  1 patch Transdermal Q24H   multivitamin with minerals  1 tablet Oral Daily   oxyCODONE  5 mg Oral Q6H   pantoprazole  40 mg Oral Daily   potassium chloride  40 mEq  Oral Q4H   sodium chloride flush  3 mL Intravenous Q12H   Continuous Infusions:  sodium chloride     ceFEPime (MAXIPIME) IV 200 mL/hr at 01/26/22 0600   ciprofloxacin 400 mg (01/26/22 0914)   PRN Meds:.sodium chloride, docusate sodium, haloperidol lactate, ipratropium-albuterol, sodium chloride flush, traZODone Medications Prior to Admission:  Prior to Admission medications   Medication Sig Start Date End Date Taking? Authorizing Provider  amLODipine (NORVASC) 10 MG tablet Take 1 tablet (10 mg total) by mouth daily. 12/02/21  Yes Barb Merino, MD  Baclofen 5 MG TABS Take 5 mg  by mouth 3 (three) times daily as needed for muscle spasms. 12/02/21  Yes Ghimire, Dante Gang, MD  Blood Glucose Monitoring Suppl (BLOOD GLUCOSE MONITOR SYSTEM) w/Device KIT USE UP TO 4 TIMES DAILY AS DIRECTED. 02/26/21  Yes [name], MD  Blood Glucose Monitoring Suppl (RIGHTEST [id number] BLOOD GLUCOSE) w/Device KIT Use as directed to test blood sugar up to 4 times daily 12/02/21  Yes Ghimire, Dante Gang, MD  Insulin Lispro Prot & Lispro (HUMALOG MIX 75/25 KWIKPEN) (75-25) 100 UNIT/ML Kwikpen Inject 18 Units into the skin 2 (two) times daily. 12/02/21  Yes Ghimire, Dante Gang, MD  Insulin Pen Needle (COMFORT EZ PEN NEEDLES) 32G X 4 MM MISC USE AS DIRECTED TWICE DAILY (MORNING AND BEDTIME) 12/02/21  Yes Barb Merino, MD  Rightest GL300 Lancets MISC USE AS DIRECTED UP TO 4 TIMES DAILY. 12/02/21  Yes Barb Merino, MD  glucose blood (RIGHTEST [id number] BLOOD GLUCOSE) test strip Use as directed to check blood sugar up to 4 times daily 12/02/21   Barb Merino, MD  levofloxacin (LEVAQUIN) 750 MG tablet Take 750 mg by mouth daily. Patient not taking: Reported on 01/22/2022 01/12/22 01/26/22  [provider]   Allergies  Allergen Reactions   Heparin     Undergoing HITT w/u follow pending PF4 & SRA result   Review of Systems  Unable to perform ROS: Other    Physical Exam Vitals and nursing note reviewed.  Constitutional:       General: He is not in acute distress.    Appearance: He is ill-appearing.     Comments: Appears frail and thin  Cardiovascular:     Rate and Rhythm: Normal rate.  Pulmonary:     Effort: Pulmonary effort is normal. No respiratory distress.  Neurological:     Mental Status: He is alert and oriented to person, place, and time.  Psychiatric:        Mood and Affect: Mood normal.        Behavior: Behavior normal.     Comments: Calm and cooperative     Vital Signs: BP 118/70   Pulse 89   Temp 98.2 F (36.8 C) (Oral)   Resp (!) 29   Wt 53.8 kg   SpO2 100%   BMI 17.98 kg/m  Pain Scale: 0-10   Pain Score: Asleep   SpO2: SpO2: 100 % O2 Device:SpO2: 100 % O2 Flow Rate: .   IO: Intake/output summary:  Intake/Output Summary (Last 24 hours) at 01/26/2022 1450 Last data filed at 01/26/2022 1155 Gross per 24 hour  Intake 782.32 ml  Output 2750 ml  Net -1967.68 ml    LBM: Last BM Date : 01/25/22 Baseline Weight: Weight: 53.2 kg Most recent weight: Weight: 53.8 kg     Palliative Assessment/Data:   Flowsheet Rows    Flowsheet Row Most Recent Value  Intake Tab   Referral Department Hospitalist  Unit at Time of Referral Intermediate Care Unit  Palliative Care Primary Diagnosis Sepsis/Infectious Disease  Date Notified 01/25/22  Palliative Care Type New Palliative care  Reason for referral Clarify Goals of Care  Date of Admission 01/21/22  Date first seen by Palliative Care 01/26/22  # of days Palliative referral response time 1 Day(s)  # of days IP prior to Palliative referral 4  Clinical Assessment   Palliative Performance Scale Score 40%  Pain Max last 24 hours Not able to report  Pain Min Last 24 hours Not able to report  Dyspnea Max Last 24 Hours Not able to report  Dyspnea Min Last 24 hours Not able to report  Psychosocial & Spiritual Assessment   Palliative Care Outcomes        Time In: 1430 Time Out: 1525 Time Total: 55 minutes  Greater than 50%  of this time  was spent counseling and coordinating care related to the above assessment and plan.  Signed by: Drue Novel, NP   Please contact Palliative Medicine Team phone at [phone] for questions and concerns.  For individual provider: See Shea Evans

## 2022-01-26 NOTE — Inpatient Diabetes Management (Signed)
Inpatient Diabetes Program Recommendations  AACE/ADA: New Consensus Statement on Inpatient Glycemic Control   Target Ranges:  Prepandial:   less than 140 mg/dL      Peak postprandial:   less than 180 mg/dL (1-2 hours)      Critically ill patients:  140 - 180 mg/dL    Latest Reference Range & Units 01/26/22 00:39 01/26/22 04:39  Glucose-Capillary 70 - 99 mg/dL 77 83    Latest Reference Range & Units 01/25/22 07:57 01/25/22 12:53 01/25/22 15:47 01/25/22 20:06  Glucose-Capillary 70 - 99 mg/dL 235 (H) 361 (H) 443 (H) 183 (H)   Review of Glycemic Control  Diabetes history: DM2 Outpatient Diabetes medications: Humalog 75/25 18 units BID Current orders for Inpatient glycemic control: Semglee 10 units QHS, Novolog 0-15 units Q4H  Inpatient Diabetes Program Recommendations:    Insulin: Please consider decreasing Semglee to 8 units QHS. If patient is eating well, please consider changing frequency of CBGs to AC&HS and Novolog correction to 0-15 units AC&HS. If post prandial glucose consistently elevated, may want to ordering Novolog meal coverage.  Diet: If appropriate, please consider changing diet from Regular to Carb Modified.  Thanks, [name], RN, MSN, CDCES Diabetes Coordinator Inpatient Diabetes Program [phone] (Team Pager from 8am to 5pm)

## 2022-01-26 NOTE — Consult Note (Signed)
Psychiatry: Brief consult note.  Asked to see this patient for opiate withdrawal.  Patient seen and chart reviewed case reviewed with intensivist and infectious disease specialist.  56 year old man in the hospital with deep infections related to IV drug use also substance withdrawal.  Patient reports that he is feeling pain sickness general discomfort all over which he interprets as opiate withdrawal.  He says he is a regular user of intravenous narcotics and last had some prior to admission which he guesses to be about 5 days ago.  Patient is alert and oriented otherwise seems motivated for treatment affect and thoughts appear appropriate.  Patient reports he has a past history of being in a methadone clinic that was helpful before and has more familiarity with the drug methadone.  Patient has been given some oxycodone and morphine today although it has been several hours since the last dose there is still some concern that Suboxone could precipitate some withdrawal.  Under the circumstances and given that he is in the ICU where safety is going to be closely monitored it would be reasonable to use methadone which is what the patient prefers.  Orders placed to begin 40 mg of p.o. methadone daily first dose today also as needed doses of Ativan for anxiety and agitation.  We can titrate what ever is needed to keep him comfortable enough that he will not leave AMA and he can get the proper course of antibiotics.  Psychiatry can follow up.

## 2022-01-26 NOTE — Progress Notes (Signed)
Nutrition Follow Up Note   DOCUMENTATION CODES:   Severe malnutrition in context of social or environmental circumstances  INTERVENTION:   Ensure Enlive po TID, each supplement provides 350 kcal and 20 grams of protein.  MVI po daily   Pt at high refeed risk; recommend monitor potassium, magnesium and phosphorus labs daily until stable  NUTRITION DIAGNOSIS:   Severe Malnutrition related to social / environmental circumstances as evidenced by severe muscle depletion, severe fat depletion.  GOAL:   Patient will meet greater than or equal to 90% of their needs -progressing   MONITOR:   PO intake, Supplement acceptance, Labs, Weight trends, Skin, I & O's  ASSESSMENT:   56 y/o male with h/o DM, substance abuse and HTN who is admitted with severe sepsis with septic shock secondary to osteomyelitis in the R hip and aortic valve endocarditis. MRI brain consistent with evolving subacute ischemic infarcts.  Pt extubated 9/6. Pt initiated on a regular diet yesterday. Pt with good oral intake in hospital. RD will add supplements and MVI to help pt meet his estimated needs. Pt remains at refeed risk. If pt develops hyperglycemia can restrict diet and change supplements; will keep regular diet for now. Per chart, pt remains weight stable since admission. Pt +3.6L on his I & Os.   Medications reviewed and include: insulin, MVI, oxycodone, protonix, Kcl, cefepime, ciprofloxacin   Labs reviewed: K 3.2(L), P 2.6 wnl, Mg 1.8 wnl Hgb 7.8(L), Hct 23.7(L) Cbgs- 177, 143, 83, 77 x 24 hrs  Diet Order:    Diet Order             Diet regular Room service appropriate? Yes; Fluid consistency: Thin  Diet effective now                  EDUCATION NEEDS:   No education needs have been identified at this time  Skin:  Skin Assessment: Reviewed RN Assessment  Last BM:  9/6- type 7  Height:   Ht Readings from Last 1 Encounters:  01/14/22 5' 8.11" (1.73 m)    Weight:   Wt Readings from  Last 1 Encounters:  01/26/22 53.8 kg    Ideal Body Weight:  72.7 kg  BMI:  Body mass index is 17.98 kg/m.  Estimated Nutritional Needs:   Kcal:  1700-2000kcal/day  Protein:  85-95g/day  Fluid:  1.6-1.8L/day  Betsey Holiday MS, RD, LDN Please refer to [facility] for RD and/or RD on-call/weekend/after hours pager

## 2022-01-27 DIAGNOSIS — D696 Thrombocytopenia, unspecified: Secondary | ICD-10-CM

## 2022-01-27 DIAGNOSIS — D649 Anemia, unspecified: Secondary | ICD-10-CM | POA: Diagnosis present

## 2022-01-27 LAB — CBC
HCT: 23.9 % — ABNORMAL LOW (ref 39.0–52.0)
Hemoglobin: 7.5 g/dL — ABNORMAL LOW (ref 13.0–17.0)
MCH: 28.3 pg (ref 26.0–34.0)
MCHC: 31.4 g/dL (ref 30.0–36.0)
MCV: 90.2 fL (ref 80.0–100.0)
Platelets: 53 10*3/uL — ABNORMAL LOW (ref 150–400)
RBC: 2.65 MIL/uL — ABNORMAL LOW (ref 4.22–5.81)
RDW: 14.3 % (ref 11.5–15.5)
WBC: 6.5 10*3/uL (ref 4.0–10.5)
nRBC: 0 % (ref 0.0–0.2)

## 2022-01-27 LAB — FERRITIN: Ferritin: 163 ng/mL (ref 24–336)

## 2022-01-27 LAB — GLUCOSE, CAPILLARY
Glucose-Capillary: 74 mg/dL (ref 70–99)
Glucose-Capillary: 73 mg/dL (ref 70–99)
Glucose-Capillary: 81 mg/dL (ref 70–99)
Glucose-Capillary: 58 mg/dL — ABNORMAL LOW (ref 70–99)
Glucose-Capillary: 117 mg/dL — ABNORMAL HIGH (ref 70–99)
Glucose-Capillary: 63 mg/dL — ABNORMAL LOW (ref 70–99)
Glucose-Capillary: 93 mg/dL (ref 70–99)

## 2022-01-27 LAB — BASIC METABOLIC PANEL
Anion gap: 3 — ABNORMAL LOW (ref 5–15)
BUN: 18 mg/dL (ref 6–20)
CO2: 26 mmol/L (ref 22–32)
Calcium: 7.8 mg/dL — ABNORMAL LOW (ref 8.9–10.3)
Chloride: 110 mmol/L (ref 98–111)
Creatinine, Ser: 0.64 mg/dL (ref 0.61–1.24)
GFR, Estimated: >60 mL/min (ref >60)
Glucose, Bld: 64 mg/dL — ABNORMAL LOW (ref 70–99)
Potassium: 4.2 mmol/L (ref 3.5–5.1)
Sodium: 139 mmol/L (ref 135–145)

## 2022-01-27 LAB — VITAMIN B12: Vitamin B-12: 462 pg/mL (ref 180–914)

## 2022-01-27 LAB — HEMOGLOBIN A1C
Hgb A1c MFr Bld: 9.4 % — ABNORMAL HIGH (ref 4.8–5.6)
Mean Plasma Glucose: 223.08 mg/dL

## 2022-01-27 LAB — RETICULOCYTES
Immature Retic Fract: 19.7 % — ABNORMAL HIGH (ref 2.3–15.9)
RBC.: 2.95 MIL/uL — ABNORMAL LOW (ref 4.22–5.81)
Retic Count, Absolute: 23.3 10*3/uL (ref 19.0–186.0)
Retic Ct Pct: 0.8 % (ref 0.4–3.1)

## 2022-01-27 LAB — IRON AND TIBC
Iron: 14 ug/dL — ABNORMAL LOW (ref 45–182)
Saturation Ratios: 9 % — ABNORMAL LOW (ref 17.9–39.5)
TIBC: 164 ug/dL — ABNORMAL LOW (ref 250–450)
UIBC: 150 ug/dL

## 2022-01-27 LAB — FOLATE: Folate: 10.5 ng/mL (ref >5.9)

## 2022-01-27 LAB — MAGNESIUM: Magnesium: 1.8 mg/dL (ref 1.7–2.4)

## 2022-01-27 LAB — PHOSPHORUS: Phosphorus: 2.9 mg/dL (ref 2.5–4.6)

## 2022-01-27 MED ORDER — LORAZEPAM 2 MG/ML IJ SOLN
1.0000 mg | Freq: Four times a day (QID) | INTRAMUSCULAR | Status: DC | PRN
Start: 2022-01-27 — End: 2022-01-31
  Administered 2022-01-29: 1 mg via INTRAVENOUS
  Filled 2022-01-27: qty 1

## 2022-01-27 MED ORDER — INSULIN ASPART 100 UNIT/ML IJ SOLN: 0.0000 [IU] | Freq: Every day | INTRAMUSCULAR | Status: DC

## 2022-01-27 MED ORDER — INSULIN GLARGINE-YFGN 100 UNIT/ML ~~LOC~~ SOLN
5.0000 [IU] | Freq: Two times a day (BID) | SUBCUTANEOUS | Status: DC
  Filled 2022-01-27 (×2): qty 0.05

## 2022-01-27 MED ORDER — DEXTROSE 50 % IV SOLN: 50.0000 mL | Freq: Once | INTRAVENOUS | Status: DC

## 2022-01-27 MED ORDER — SODIUM CHLORIDE 0.9 % IV BOLUS
1000.0000 mL | Freq: Once | INTRAVENOUS | Status: AC
  Administered 2022-01-27: 1000 mL via INTRAVENOUS

## 2022-01-27 MED ORDER — DEXTROSE 50 % IV SOLN
INTRAVENOUS | Status: AC
  Filled 2022-01-27: qty 50

## 2022-01-27 MED ORDER — INSULIN ASPART 100 UNIT/ML IJ SOLN
0.0000 [IU] | Freq: Three times a day (TID) | INTRAMUSCULAR | Status: DC
  Administered 2022-01-29 – 2022-01-30 (×3): 2 [IU] via SUBCUTANEOUS
  Administered 2022-01-31: 3 [IU] via SUBCUTANEOUS
  Administered 2022-02-01: 2 [IU] via SUBCUTANEOUS
  Administered 2022-02-01 – 2022-02-02 (×2): 3 [IU] via SUBCUTANEOUS
  Administered 2022-02-04: 2 [IU] via SUBCUTANEOUS
  Administered 2022-02-06: 3 [IU] via SUBCUTANEOUS
  Administered 2022-02-06: 5 [IU] via SUBCUTANEOUS
  Administered 2022-02-07 (×2): 2 [IU] via SUBCUTANEOUS
  Administered 2022-02-09: 5 [IU] via SUBCUTANEOUS
  Administered 2022-02-09 – 2022-02-11 (×2): 2 [IU] via SUBCUTANEOUS
  Administered 2022-02-11: 3 [IU] via SUBCUTANEOUS
  Filled 2022-01-27 (×17): qty 1

## 2022-01-27 MED ORDER — METHADONE HCL 10 MG PO TABS
30.0000 mg | ORAL_TABLET | Freq: Every day | ORAL | Status: DC
  Administered 2022-01-28 – 2022-01-31 (×4): 30 mg via ORAL
  Filled 2022-01-27 (×5): qty 3

## 2022-01-27 MED ORDER — FE FUMARATE-B12-VIT C-FA-IFC PO CAPS
1.0000 | ORAL_CAPSULE | Freq: Two times a day (BID) | ORAL | Status: DC
  Administered 2022-01-28 – 2022-02-02 (×7): 1 via ORAL
  Filled 2022-01-27 (×13): qty 1

## 2022-01-27 NOTE — Assessment & Plan Note (Signed)
Right ischial tuberosity osteomyelitis on MRI which is little unusual site for osteo.  No abscess or overlying ulcer.  Positive blood cultures, most likely blood seeding. -On appropriate antibiotics 

## 2022-01-27 NOTE — Assessment & Plan Note (Signed)
Estimated body mass index is 17.68 kg/m as calculated from the following:   Height as of 01/14/22: 5' 8.11" (1.73 m).   Weight as of this encounter: 52.9 kg.   -Dietitian consult

## 2022-01-27 NOTE — Assessment & Plan Note (Signed)
Patient developed sudden onset thrombocytopenia which nider at platelets of 32, now started improving slowly.  HIT panel was negative. DIC labs were negative. Most likely secondary to bone marrow suppression with sepsis.  No obvious bleeding. -Continue to monitor

## 2022-01-27 NOTE — Progress Notes (Signed)
The patient's noon blood pressure reading is 77/49 (59) and upon re-check blood pressures is 81/57 (66); Dr. Nelson Chimes notified and ordered to give the patient 1L normal saline bolus.

## 2022-01-27 NOTE — Progress Notes (Addendum)
   Date of Admission:  01/21/2022     ID: Earl Moreno is a 56 y.o. 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)   Altered mental status   Bacteremia   Aortic valve endocarditis   Acute HFrEF (heart failure with reduced ejection fraction) ([facility])   Acute bacterial endocarditis   Osteomyelitis ([facility]CC)    Subjective: Pt is  somnolent Had 40mg  Methadone 5pm  And ten 40mg  this morning With ativan 2 mg Breathing fine   Medications:   acetaminophen  975 mg Oral Q6H   Chlorhexidine Gluconate Cloth  6 each Topical Daily   enoxaparin (LOVENOX) injection  40 mg Subcutaneous Q24H   feeding supplement  237 mL Oral TID BM   insulin aspart  0-15 Units Subcutaneous TID WC   insulin aspart  0-5 Units Subcutaneous QHS   insulin glargine-yfgn  5 Units Subcutaneous BID   lidocaine  1 patch Transdermal Q24H   methadone  40 mg Oral Daily   multivitamin with minerals  1 tablet Oral Daily   pantoprazole  40 mg Oral Daily   sodium chloride flush  3 mL Intravenous Q12H    Objective: Vital signs in last 24 hours: Temp:  [98.1 F (36.7 C)-98.7 F (37.1 C)] 98.2 F (36.8 C) (09/08 0800) Pulse Rate:  [72-91] 72 (09/08 0800) Resp:  [13-32] 14 (09/08 0800) BP: (87-123)/(52-79) 107/70 (09/08 0800) SpO2:  [94 %-100 %] 99 % (09/08 0800) Weight:  [52.9 kg] 52.9 kg (09/08 0500)    PHYSICAL EXAM:  General: somnolent Lungs: b/l air entry Heart: s1s2 Abdomen: Soft, non-tender,not distended. Bowel sounds normal. No masses Extremities: atraumatic, no cyanosis. No edema. No clubbing Skin: No rashes or lesions. Or bruising Lymph: Cervical, supraclavicular normal. Neurologic: cannot assess  Lab Results Recent Labs    01/26/22 0809 01/27/22 0417  WBC 7.2 6.5  HGB 7.8* 7.5*  HCT 23.7* 23.9*  NA 138 139  K 3.2* 4.2  CL 105 110  CO2 26 26  BUN 18 18  CREATININE 0.63 0.64   Liver Panel Recent  Labs    01/25/22 0634  BILITOT 0.6  BILIDIR 0.1    Microbiology: 01/22/22 - serratia 01/26/22 BC sent Studies/Results: No results found.   Assessment/Plan: Encephalopathy secondary to sepsis and infarct had resolved ? Acute hypoxic resp failure- extubated   Serratia bacteremia with aortic valve endocarditis On dual coverage with cefepime + cipro Repeat blood culture sent   Started methadone yesterday- as he is heavily heroin dependent- somnolent today. Dose being adjusted No resp distress  IVDA   Osteomyelitis rt pubic ramus Previous bacteremias including serratia , enterobacter- not treated because of him leaving AMA ? _Anemia   Thrombocytopenia  Discussed the management with care team  [facility] available by phone for urgent issues this weekend

## 2022-01-27 NOTE — Assessment & Plan Note (Signed)
Initial blood cultures positive for Serratia. -See above for management

## 2022-01-27 NOTE — Assessment & Plan Note (Signed)
Found to have large aortic valve vegetation with history of Serratia bacteremia and IV drug use. Not a suitable candidate for any surgical intervention according to [facility] cardiothoracic surgery. -Continue with antibiotic

## 2022-01-27 NOTE — Progress Notes (Signed)
PHARMACY CONSULT NOTE  Pharmacy Consult for Electrolyte Monitoring and Replacement   Recent Labs: Potassium (mmol/L)  Date Value  01/27/2022 4.2  01/19/2013 3.6   Magnesium (mg/dL)  Date Value  70/35/0093 1.8   Calcium (mg/dL)  Date Value  81/82/9937 7.8 (L)   Calcium, Total (mg/dL)  Date Value  16/96/7893 8.7   Albumin (g/dL)  Date Value  81/05/7508 2.8 (L)  12/30/2012 3.9   Phosphorus (mg/dL)  Date Value  25/85/2778 2.9   Sodium (mmol/L)  Date Value  01/27/2022 139  01/19/2013 140     Assessment: 55 y.o. male who complains of right-sided low back pain dysuria and frequency.  Per chart review patient with multiple admissions for sepsis including pyelonephritis, GN bacteremia & suspected osteomyelitis in his right hip as recently as 01/15/22. There have been multiple attempts to treat with IV antibiotics & perform a TEE and rule out endocarditis but the patient continues to leave AMA. Pharmacy is asked to follow and replace electrolytes while in CCU  Goal of Therapy:  Electrolytes WNL  Plan:  Scr stable; Net +3.4L; UOP 2.9>0.76ml/k/h K: 3.2>4.2: After receiving PO and q1h x4 doses on 9/07. Now WNL, no further repletion required at this time. Mg 1.8>1.8: Remains WNL, no further repletion at this time.  Phos 2.6>2.9: Remains WNL, no further repletion at this time. Recheck electrolytes in am  Martyn Malay ,PharmD Clinical Pharmacist 01/27/2022 8:50 AM

## 2022-01-27 NOTE — Assessment & Plan Note (Signed)
Worsening hemoglobin, at 7.5 today.  No obvious bleeding.  Most likely secondary to bone marrow suppression with sepsis. Anemia panel shows anemia of chronic disease with some iron deficiency. B12 levels pending. -Start him on p.o. iron supplement.

## 2022-01-27 NOTE — Evaluation (Signed)
Physical Therapy Evaluation Patient Details Name: Earl Moreno MRN: [id number] DOB: 11-28-1965 Today's Date: 01/27/2022  History of Present Illness  Pt is a 56 y.o. male  with past medical history of multiple admissions for sepsis including pyelonephritis, GN bacteremia and suspected osteomyelitis in his right hip as recently as 01/15/2022 with multiple attempts to treat with IV antibiotics but patient continues to leave AMA, history of heroin abuse, DM 2, splenic infarct, admitted on 01/21/2022 with septic shock initially requiring intubation/ventilation.  Clinical Impression  Pt seen for PT evaluation with co-tx with OT. Pt received asleep in bed with full urinal in hand & pt awakened. Pt noted to be soiled with urine & incontinent BM & assisted with rolling L<>R with MAX assist as pt with very rigid extremities & decreased ability to help. Ultimately assisted pt to sitting EOB with max assist & pt able to demonstrate up to fair sitting balance. Pt completes STS x 2 with mod<>Max assist HHA +2 along EOB to allow PT/OT to assist pt with pivoting higher up in bed. Pt is oriented to self (name & birthday but incorrect age) & location but with decreased ability to follow 1 step commands. Pt would benefit from STR upon d/c to maximize independence with functional mobility & reduce fall risk prior to return home.        Recommendations for follow up therapy are one component of a multi-disciplinary discharge planning process, led by the attending physician.  Recommendations may be updated based on patient status, additional functional criteria and insurance authorization.  Follow Up Recommendations Skilled nursing-short term rehab (<3 hours/day) Can patient physically be transported by private vehicle: No    Assistance Recommended at Discharge Frequent or constant Supervision/Assistance  Patient can return home with the following  Two people to help with walking and/or transfers;Two people to help with  bathing/dressing/bathroom;Help with stairs or ramp for entrance;Direct supervision/assist for medications management;Assist for transportation;Assistance with cooking/housework;Direct supervision/assist for financial management    Equipment Recommendations  (TBD in next venue)  Recommendations for Other Services       Functional Status Assessment Patient has had a recent decline in their functional status and demonstrates the ability to make significant improvements in function in a reasonable and predictable amount of time.     Precautions / Restrictions Precautions Precautions: Fall Restrictions Weight Bearing Restrictions: No      Mobility  Bed Mobility Overal bed mobility: Needs Assistance Bed Mobility: Rolling, Supine to Sit, Sit to Supine Rolling: Max assist   Supine to sit: Max assist Sit to supine: Max assist        Transfers Overall transfer level: Needs assistance Equipment used: 2 person hand held assist Transfers: Sit to/from Stand Sit to Stand: Mod assist, Max assist, +2 physical assistance                Ambulation/Gait                  Stairs            Wheelchair Mobility    Modified Rankin (Stroke Patients Only)       Balance Overall balance assessment: Needs assistance Sitting-balance support: Feet supported, Bilateral upper extremity supported Sitting balance-Leahy Scale: Fair Sitting balance - Comments: supervision<>min assist static sitting, posterior lean with pt initiating correcting ~50% of the time   Standing balance support: Bilateral upper extremity supported, During functional activity Standing balance-Leahy Scale: Poor  Pertinent Vitals/Pain Pain Assessment Pain Assessment: Faces Faces Pain Scale: Hurts a little bit Pain Location: "my bones" Pain Descriptors / Indicators: Discomfort Pain Intervention(s): Repositioned, Monitored during session    Home Living                      Additional Comments: Pt unable to provide PLOF/home set up information 2/2 impaired cognition, no family present in room.    Prior Function               Mobility Comments: Pt unable to provide PLOF/home set up information 2/2 impaired cognition, no family present in room.       Hand Dominance        Extremity/Trunk Assessment   Upper Extremity Assessment Upper Extremity Assessment:  (pt with stiff extremities)    Lower Extremity Assessment Lower Extremity Assessment:  (pt with stiff extremities)       Communication      Cognition Arousal/Alertness: Lethargic, Suspect due to medications Behavior During Therapy: Flat affect Overall Cognitive Status: Impaired/Different from baseline Area of Impairment: Orientation, Attention, Memory, Following commands, Safety/judgement, Awareness, Problem solving                 Orientation Level: Disoriented to, Situation, Time   Memory: Decreased short-term memory Following Commands: Follows one step commands inconsistently, Follows one step commands with increased time Safety/Judgement: Decreased awareness of safety, Decreased awareness of deficits Awareness: Intellectual Problem Solving: Slow processing, Decreased initiation, Requires verbal cues, Requires tactile cues          General Comments General comments (skin integrity, edema, etc.): Pt on room air, SpO2 100%. BP at end of session: 112/66 mmHg MAP 80    Exercises     Assessment/Plan    PT Assessment Patient needs continued PT services  PT Problem List Decreased strength;Decreased coordination;Decreased activity tolerance;Decreased range of motion;Decreased cognition;Decreased knowledge of use of DME;Decreased balance;Decreased safety awareness;Decreased mobility;Decreased knowledge of precautions       PT Treatment Interventions DME instruction;Therapeutic exercise;Gait training;Balance training;Stair training;Neuromuscular  re-education;Functional mobility training;Cognitive remediation;Therapeutic activities;Patient/family education;Modalities;Manual techniques    PT Goals (Current goals can be found in the Care Plan section)  Acute Rehab PT Goals PT Goal Formulation: Patient unable to participate in goal setting Time For Goal Achievement: 02/10/22    Frequency Min 2X/week     Co-evaluation PT/OT/SLP Co-Evaluation/Treatment: Yes Reason for Co-Treatment: Complexity of the patient's impairments (multi-system involvement);Necessary to address cognition/behavior during functional activity;For patient/therapist safety;To address functional/ADL transfers PT goals addressed during session: Mobility/safety with mobility;Balance         AM-PAC PT "6 Clicks" Mobility  Outcome Measure Help needed turning from your back to your side while in a flat bed without using bedrails?: Total Help needed moving from lying on your back to sitting on the side of a flat bed without using bedrails?: Total Help needed moving to and from a bed to a chair (including a wheelchair)?: Total Help needed standing up from a chair using your arms (e.g., wheelchair or bedside chair)?: Total Help needed to walk in hospital room?: Total Help needed climbing 3-5 steps with a railing? : Total 6 Click Score: 6    End of Session   Activity Tolerance: Patient tolerated treatment well;Patient limited by lethargy Patient left: in bed;with call bell/phone within reach (notified nurse of bed alarm not working) Nurse Communication: Mobility status PT Visit Diagnosis: Muscle weakness (generalized) (M62.81);Difficulty in walking, not elsewhere classified (R26.2);Unsteadiness on feet (R26.81)  Time: 7185-5015 PT Time Calculation (min) (ACUTE ONLY): 18 min   Charges:   PT Evaluation $PT Eval High Complexity: 1 High          Aleda Grana, PT, DPT 01/27/22, 3:17 PM   Sandi Mariscal 01/27/2022, 3:15 PM

## 2022-01-27 NOTE — Evaluation (Signed)
Occupational Therapy Evaluation Patient Details Name: Earl Moreno MRN: [id number] DOB: Jun 26, 1965 Today's Date: 01/27/2022   History of Present Illness Pt is a 56 y.o. male  with past medical history of multiple admissions for sepsis including pyelonephritis, GN bacteremia and suspected osteomyelitis in his right hip as recently as 01/15/2022 with multiple attempts to treat with IV antibiotics but patient continues to leave AMA, history of heroin abuse, DM 2, splenic infarct, admitted on 01/21/2022 with septic shock initially requiring intubation/ventilation.   Clinical Impression   Chart reviewed, RN and MD cleared pt for participation in evaluation. Co tx completed with PT on this date. Pt is oriented to self and date only, poor awareness of current deficits, attention, direction following. PLOF received from chart which states pt was living in his car with significant other, requiring some assist for mobility and ADL due to acute illness (pt was recently hospitalized and left AMA). Pt presents with deficits in strength, endurance, activity tolerance, cognition, balance all affecting safe and optimal ADL completion. MAX A required for rolling, MOD-MAX A +2 via HHA required for STS, MAX A required for peri care following BM, MOD A required for UB dressing. Recommend discharge to STR to address functional deficits. Pt is left as received, all needs met. OT will follow acutely.      Recommendations for follow up therapy are one component of a multi-disciplinary discharge planning process, led by the attending physician.  Recommendations may be updated based on patient status, additional functional criteria and insurance authorization.   Follow Up Recommendations  Skilled nursing-short term rehab (<3 hours/day)    Assistance Recommended at Discharge Frequent or constant Supervision/Assistance  Patient can return home with the following A lot of help with walking and/or transfers;A lot of help with  bathing/dressing/bathroom    Functional Status Assessment  Patient has had a recent decline in their functional status and demonstrates the ability to make significant improvements in function in a reasonable and predictable amount of time.  Equipment Recommendations  Other (comment) (per next venue of care)    Recommendations for Other Services       Precautions / Restrictions Precautions Precautions: Fall Restrictions Weight Bearing Restrictions: No      Mobility Bed Mobility Overal bed mobility: Needs Assistance Bed Mobility: Rolling, Supine to Sit, Sit to Supine Rolling: Max assist   Supine to sit: Max assist, +2 for physical assistance Sit to supine: Max assist, +2 for physical assistance        Transfers Overall transfer level: Needs assistance Equipment used: 2 person hand held assist Transfers: Sit to/from Stand Sit to Stand: Mod assist, Max assist, +2 physical assistance                  Balance Overall balance assessment: Needs assistance Sitting-balance support: Feet supported, Bilateral upper extremity supported Sitting balance-Leahy Scale: Fair     Standing balance support: Bilateral upper extremity supported, During functional activity Standing balance-Leahy Scale: Poor                             ADL either performed or assessed with clinical judgement   ADL Overall ADL's : Needs assistance/impaired     Grooming: Sitting;Wash/dry face;Maximal assistance       Lower Body Bathing: Maximal assistance;Bed level   Upper Body Dressing : Moderate assistance;Sitting Upper Body Dressing Details (indicate cue type and reason): gown Lower Body Dressing: Maximal assistance Lower Body Dressing Details (  indicate cue type and reason): socks     Toileting- Clothing Manipulation and Hygiene: Maximal assistance;Bed level Toileting - Clothing Manipulation Details (indicate cue type and reason): peri care following BM       General ADL  Comments: step by step verbal and tactile cues required throughout for sequencing and attention to task     Vision   Additional Comments: no reports of vision changes however pt with cognitive deficits at this time, OT will continue to assess     Perception     Praxis      Pertinent Vitals/Pain Pain Assessment Pain Assessment: Faces Faces Pain Scale: Hurts a little bit Pain Location: my bones Pain Descriptors / Indicators: Discomfort Pain Intervention(s): Limited activity within patient's tolerance, Monitored during session, Repositioned     Hand Dominance     Extremity/Trunk Assessment Upper Extremity Assessment Upper Extremity Assessment: RUE deficits/detail;LUE deficits/detail RUE Deficits / Details: AROM shoulder flexion to approx 80 degrees, elbow, wrist appear WFL however rigid thoughout; PROM appears WFL RUE Coordination: decreased fine motor;decreased gross motor LUE Deficits / Details: AROM shoulder flexion to approx 80 degrees, elbow, wrist appear WFL however rigid thoughout; PROM appears WFL LUE Coordination: decreased fine motor;decreased gross motor   Lower Extremity Assessment Lower Extremity Assessment: Generalized weakness   Cervical / Trunk Assessment Cervical / Trunk Assessment: Normal   Communication     Cognition Arousal/Alertness: Lethargic, Suspect due to medications Behavior During Therapy: Flat affect Overall Cognitive Status: Impaired/Different from baseline Area of Impairment: Orientation, Attention, Memory, Following commands, Safety/judgement, Awareness, Problem solving                 Orientation Level: Disoriented to, Situation, Time Current Attention Level: Focused Memory: Decreased short-term memory Following Commands: Follows one step commands inconsistently, Follows one step commands with increased time Safety/Judgement: Decreased awareness of safety, Decreased awareness of deficits Awareness: Intellectual Problem Solving: Slow  processing, Decreased initiation, Requires verbal cues, Requires tactile cues       General Comments  vss throughout    Exercises     Shoulder Instructions      Home Living Family/patient expects to be discharged to:: Private residence                                 Additional Comments: pt unable to provide PLOF/home set up information due to impaired cognition, no family present; per chart pt was living in his car with his significant other, unable to care for himself PTA      Prior Functioning/Environment               Mobility Comments: Pt unable to provide PLOF/home set up information 2/2 impaired cognition, no family present in room. ADLs Comments: per chart was requring assist due to acute illness PTA for ADL/IADL        OT Problem List: Decreased strength;Decreased activity tolerance;Decreased range of motion;Impaired balance (sitting and/or standing);Decreased safety awareness;Decreased knowledge of use of DME or AE;Decreased coordination;Decreased cognition      OT Treatment/Interventions: Self-care/ADL training;Balance training;Therapeutic exercise;Therapeutic activities;Energy conservation;DME and/or AE instruction;Patient/family education    OT Goals(Current goals can be found in the care plan section) Acute Rehab OT Goals OT Goal Formulation: Patient unable to participate in goal setting ADL Goals Pt Will Perform Grooming: sitting;standing;with modified independence Pt Will Perform Lower Body Dressing: with modified independence;sit to/from stand Pt Will Transfer to Toilet: with modified independence;ambulating Pt Will Perform  Toileting - Clothing Manipulation and hygiene: with modified independence;sit to/from stand  OT Frequency: Min 2X/week    Co-evaluation PT/OT/SLP Co-Evaluation/Treatment: Yes Reason for Co-Treatment: Necessary to address cognition/behavior during functional activity;For patient/therapist safety;To address  functional/ADL transfers PT goals addressed during session: Mobility/safety with mobility;Balance OT goals addressed during session: ADL's and self-care      AM-PAC OT "6 Clicks" Daily Activity     Outcome Measure Help from another person eating meals?: A Lot Help from another person taking care of personal grooming?: A Lot Help from another person toileting, which includes using toliet, bedpan, or urinal?: A Lot Help from another person bathing (including washing, rinsing, drying)?: A Lot Help from another person to put on and taking off regular upper body clothing?: A Lot Help from another person to put on and taking off regular lower body clothing?: A Lot 6 Click Score: 12   End of Session Nurse Communication: Mobility status  Activity Tolerance: Patient tolerated treatment well Patient left: in bed;with call bell/phone within reach  OT Visit Diagnosis: Unsteadiness on feet (R26.81);Muscle weakness (generalized) (M62.81)                Time: 4159-3012 OT Time Calculation (min): 18 min Charges:  OT General Charges $OT Visit: 1 Visit OT Evaluation $OT Eval Moderate Complexity: 1 Mod  Shanon Payor, OTD OTR/L  01/27/22, 3:53 PM

## 2022-01-27 NOTE — Assessment & Plan Note (Signed)
History of IV opioid use for many years. Psych was consulted for concern of opioid withdrawal and he was started on methadone to prevent another AMA. -Continue with methadone-psych will titrate as needed. -Continue to monitor for opioid withdrawal 

## 2022-01-27 NOTE — Assessment & Plan Note (Signed)
Resolved. Patient admitted with toxic metabolic encephalopathy secondary to sepsis and illicit drug use. -Continue to monitor 

## 2022-01-27 NOTE — Assessment & Plan Note (Signed)
Mild hypoglycemia with CBG in 70s. -Check A1c-elevated in July. -Make Semglee 5 units twice daily instead of 10 at night. -Switch SSI with moderate scale with meals. -We will add mealtime coverage if needed.

## 2022-01-27 NOTE — Assessment & Plan Note (Signed)
Patient initially admitted with severe sepsis and septic shock secondary to Serratia bacteremia and aortic valve endocarditis with his history of IV drug abuse. Multiple recent infections including pyelonephritis and osteomyelitis which were not treated adequately as he was keeps leaving AMA. Patient was intubated and required pressors in the beginning.  Extubated on 01/25/2022 and was weaned off from pressors. Blood pressure still on softer side. Repeat blood cultures on 9/7 remain negative in 24-hour. ID is on board. -Continue with cefepime and ciprofloxacin as recommended by ID for dual coverage -Patient will need long-term antibiotics and prolonged hospitalization.

## 2022-01-27 NOTE — Assessment & Plan Note (Signed)
Patient was on amlodipine at home.  Admitted with septic shock requiring pressors. Blood pressure still on softer side-weaned off from pressors. -Keep holding home amlodipine -Current continue to monitor

## 2022-01-27 NOTE — Inpatient Diabetes Management (Signed)
Inpatient Diabetes Program Recommendations  AACE/ADA: New Consensus Statement on Inpatient Glycemic Control (2015)  Target Ranges:  Prepandial:   less than 140 mg/dL      Peak postprandial:   less than 180 mg/dL (1-2 hours)      Critically ill patients:  140 - 180 mg/dL   Lab Results  Component Value Date   GLUCAP 73 01/27/2022   HGBA1C 13.2 (H) 11/29/2021    Review of Glycemic Control  Diabetes history: DM2 Outpatient Diabetes medications: Humalog 75/25 18 units BID Current orders for Inpatient glycemic control: Semglee 10 units QHS, Novolog 0-15 units Q4H   Inpatient Diabetes Program Recommendations:     Please consider: -Decrease Semglee to 8 units QHS -If patient is eating well, please consider changing frequency of CBGs to AC&HS and Novolog correction to 0-15 units AC&HS. -If post prandial glucose consistently elevated, may want to ordering Novolog meal coverage. -If appropriate, please consider changing diet from Regular to Carb Modified.  Thank you, Billy Fischer. Itai Barbian, RN, MSN, CDE  Diabetes Coordinator Inpatient Glycemic Control Team Team Pager [phone] (8am-5pm) 01/27/2022 8:24 AM

## 2022-01-27 NOTE — Progress Notes (Signed)
Patient transferred to room 258 in stable condition and cardiac monitoring on.

## 2022-01-27 NOTE — Progress Notes (Signed)
Progress Note   Patient: Earl Moreno ZOX:[id number] DOB: [birth year] DOA: 01/21/2022     5 DOS: the patient was seen and examined on 01/27/2022   Brief hospital course: ICU transfer Taken from prior notes. 56 yo M with history of IVDA, recurrent hospitalization and leaving AMA, presenting to [facility] ED on 01/21/22 via EMS complaining that he does not feel well.   Per chart review patient with multiple admissions for sepsis including pyelonephritis, GN bacteremia & suspected osteomyelitis in his right hip as recently as 01/15/22. There have been multiple attempts to treat with IV antibiotics & perform a TEE and rule out endocarditis but the patient continues to leave AMA.  On arrival to ED patient was febrile with leukocytosis, tachycardic, tachypneic, mild hyponatremia, lactic acidosis and elevated procalcitonin.  COVID-19 and influenza PCR was negative.  UDS positive for cocaine and opioids. CXR 01/22/22: no acute cardiopulmonary abnormalities CT head wo contrast 01/22/22: Focal loss of gray-white matter differentiation within the right parietal cortex which may reflect a subacute cortical infarct, though this is not well characterized on this examination. No associated mass effect or midline shift. Contrast enhanced MRI examination is recommended for further evaluation CT chest/abdomen/pelvis w contrast 01/22/22: No acute or inflammatory process identified in the chest. Satisfactory endotracheal and enteric tubes. Abdomen and pelvis remarkable for:- abnormal but nonspecific small to moderate volume of simple appearing free fluid in the pelvis. Paucity of intra-abdominal fat limits bowel detail, but no bowel obstruction or inflammation is evident.- small hypoenhancing areas in the superior spleen, with underlying borderline splenomegaly. Although nonspecific consider small splenic infarcts or hematogenous infection in this setting.- focal bone erosion of the posteroinferior right pubic ramus since last July. But  no other lytic osseous lesion identified, and no regional inflammation. This would be an unusual site for osteomyelitis. Query a metabolic (such as parathyroid) bone disorder. Metastatic disease also not excluded.  patient remained very combative and agitated despite giving multiple doses of Ativan and Haldol, he was started on Precedex and maxed out without much change, resulted in intubation and mechanical ventilation.  Extubated on 01/25/2022.  Patient was found to have Serratia bacteremia with aortic valve endocarditis, infectious disease was consulted and patient is currently on dual coverage with cefepime and ciprofloxacin. Repeat blood cultures on 01/26/2022 are negative in 24 hours.  Patient was also evaluated by [facility] and is not a candidate for surgical intervention.  Psych was also consulted for opioid withdrawal and he was started on methadone on 01/26/2022 at 40 mg daily-dose will be titrated by psych to keep him comfortable so he will not leave another AMA.  Palliative care was also consulted and he was made DNR with current level of care and treat the treatable.  TRH to resume care from 01/27/2022.  9/8: Hemodynamically stable.  Leukocytosis has been resolved.  Slowly decreasing hemoglobin, currently at 7.5, thrombocytopenia with slight improvement to 53.  HIT panel was negative. Anemia panel ordered. Borderline hypoglycemia with CBG in 70s, Semglee was changed to 5 units twice daily, switched Q 4 sliding scale with meal and nighttime coverage.  Patient will need prolonged hospitalization to complete IV antibiotic course secondary to his history of IV drug use.  High risk for deterioration and death.   Assessment and Plan: * Septic shock (HCC) Patient initially admitted with severe sepsis and septic shock secondary to Serratia bacteremia and aortic valve endocarditis with his history of IV drug abuse. Multiple recent infections including pyelonephritis and  osteomyelitis  which were not treated adequately as he was keeps leaving AMA. Patient was intubated and required pressors in the beginning.  Extubated on 01/25/2022 and was weaned off from pressors. Blood pressure still on softer side. Repeat blood cultures on 9/7 remain negative in 24-hour. ID is on board. -Continue with cefepime and ciprofloxacin as recommended by ID for dual coverage -Patient will need long-term antibiotics and prolonged hospitalization.  Bacteremia Initial blood cultures positive for Serratia. -See above for management  Aortic valve endocarditis Found to have large aortic valve vegetation with history of Serratia bacteremia and IV drug use. Not a suitable candidate for any surgical intervention according to [facility] cardiothoracic surgery. -Continue with antibiotic  Altered mental status Resolved. Patient admitted with toxic metabolic encephalopathy secondary to sepsis and illicit drug use. 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  No abscess or overlying ulcer.  Positive blood cultures, most likely blood seeding. -On appropriate antibiotics  Essential hypertension Patient was on amlodipine at home.  Admitted with septic shock requiring pressors. Blood pressure still on softer side-weaned off from pressors. -Keep holding home amlodipine -Current continue to monitor  Acute HFrEF (heart failure with reduced ejection fraction) (HCC) EF of 45 to 50% with global hypokinesis.  Decreased from his prior echocardiogram done in July 2023.  Most likely secondary to aortic endocarditis. Clinically appears euvolemic. 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 Psych was consulted for concern of opioid withdrawal and he was started on methadone to prevent another AMA. -Continue with  methadone-psych will titrate as needed. -Continue to monitor for opioid withdrawal  Type 2 diabetes mellitus (HCC) Mild hypoglycemia with CBG in 70s. -Check A1c-elevated in July. -Make Semglee 5 units twice daily instead of 10 at night. -Switch SSI with moderate scale with meals. -We will add mealtime coverage if needed.  Normocytic anemia Worsening hemoglobin, at 7.5 today.  No obvious bleeding.  Most likely secondary to bone marrow suppression with sepsis. Anemia panel shows anemia of chronic disease with some iron deficiency. B12 levels pending. -Start him on p.o. iron supplement.  Thrombocytopenia (HCC) Patient developed sudden onset thrombocytopenia which nider at platelets of 43, now started improving slowly.  HIT panel was negative. DIC labs were negative. Most likely secondary to bone marrow suppression with sepsis.  No obvious bleeding. -Continue to monitor  Protein-calorie malnutrition, severe Estimated body mass index is 17.68 kg/m as calculated from the following:   Height as of 01/14/22: 5' 8.11" (1.73 m).   Weight as of this encounter: 52.9 kg.   -Dietitian consult     Subjective: Patient was seen and examined today.  He was quite somnolent after taking methadone.  Refusing to talk, easily arousable but going back to sleep.  Per nursing staff this is his normal behavior.  Eating and drinking okay.  Physical Exam: Vitals:   01/27/22 0900 01/27/22 0958 01/27/22 1000 01/27/22 1100  BP: 125/63 93/65 93/65  106/66  Pulse: 77 73 73 74  Resp: 15 10 18 10   Temp:      TempSrc:      SpO2: 100% 96% 98% 97%  Weight:       General.  Ill-appearing, malnourished gentleman, in no acute distress. Pulmonary.  Lungs clear bilaterally, normal respiratory effort. CV.  Regular rate and rhythm, no JVD, rub or murmur. Abdomen.  Soft, nontender, nondistended, BS positive. CNS.  Somnolent,  no apparent focal deficit Extremities.  No edema, no cyanosis, pulses intact and  symmetrical. Psychiatry.  Unable to discharge as patient was very somnolent and refusing to talk.  Data Reviewed: Prior data which include labs, notes and images reviewed  Family Communication:   Disposition: Status is: Inpatient Remains inpatient appropriate because: Severity of illness.  Will need prolonged hospitalization.   Planned Discharge Destination: Home  DVT prophylaxis.  Lovenox Time spent: 55 minutes  This record has been created using Conservation officer, historic buildings. Errors have been sought and corrected,but may not always be located. Such creation errors do not reflect on the standard of care.  Author: Arnetha Courser, MD 01/27/2022 12:09 PM  For on call review [email or web address].

## 2022-01-27 NOTE — Assessment & Plan Note (Signed)
EF of 45 to 50% with global hypokinesis.  Decreased from his prior echocardiogram done in July 2023.  Most likely secondary to aortic endocarditis. Clinically appears euvolemic. -Continue to monitor

## 2022-01-27 NOTE — Hospital Course (Addendum)
ICU transfer Following summary taken from prior notes. 56 yo M with history of IVDA, recurrent hospitalization and leaving AMA, presenting to [facility] ED on 01/21/22 via EMS complaining that he does not feel well.   Per chart review patient with multiple admissions for sepsis including pyelonephritis, GN bacteremia & suspected osteomyelitis in his right hip as recently as 01/15/22. There have been multiple attempts to treat with IV antibiotics & perform a TEE and rule out endocarditis but the patient continues to leave AMA.  On arrival to ED patient was febrile with leukocytosis, tachycardic, tachypneic, mild hyponatremia, lactic acidosis and elevated procalcitonin.  COVID-19 and influenza PCR was negative.  UDS positive for cocaine and opioids. ... Due to pt being very combative and agitated despite giving multiple doses of Ativan and Haldol, he was started on Precedex and maxed out without much change, resulted in intubation and mechanical ventilation.  Extubated on 01/25/2022.  Patient was found to have Serratia bacteremia with aortic valve endocarditis, infectious disease was consulted and patient was placed on dual coverage with cefepime and ciprofloxacin.  Repeat blood cultures on 01/26/2022 negative. Has since been transitioned to meropenem, with course to extend through 2022-04-02.  Patient was also evaluated by [facility] and deemed not a candidate for surgical intervention.  Psych was also consulted for opioid withdrawal and he was started on methadone on 01/26/2022 at 40 mg daily-dose will be titrated by psych to keep him comfortable so he will not leave another AMA.  Palliative care was consulted and he was made DNR with current level of care and treat the treatable.  They continue to follow for goal of care discussion given overall poor prognosis given extent of his infection and complications.  TRH to resume care from 01/27/2022.  9/12 and 9/20: UDS was checked at sister's  request, suspecting pt's son giving him some drugs.  UDS only positive for methadone which he is taking it here.  No other concerns.  9/18 patient sustained a mechanical fall while trying to get out of bed. He attempted to go the restroom to urinate. Head CT negative  9/20 - repeated MRI brain given his persistent encephalopathy and lethargy, as initial MRI showed subacute infarcts.  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  Reached back out to CT surgery -- surgery not an option due to extremely high risk of hemorrhagic stroke given brain findings.    9/20: started on NG tube feeds.  Pt pulled tube out next day.  He has since been eating well.   9/21--26: mental status improved, waxes and wanes but overall he's been alert & oriented, with disordered thinking at times.    Patient will need prolonged hospitalization to complete IV antibiotic course (2022/04/02) secondary to his history of IV drug use.  Remains very high risk for deterioration and death.

## 2022-01-27 NOTE — Progress Notes (Addendum)
Patient's blood sugar reading was 57, Dr. Nelson Chimes notified and patient given D50 amp, will follow up with blood sugar recheck.  Dr. Nelson Chimes ordered for [facility] to be discontinued, patient's blood sugar re-check is 117.

## 2022-01-27 NOTE — Plan of Care (Signed)
Continuing with plan of care. 

## 2022-01-27 NOTE — Progress Notes (Signed)
Report given to receiving nurse, Marylene Land.

## 2022-01-28 LAB — GLUCOSE, CAPILLARY
Glucose-Capillary: 102 mg/dL — ABNORMAL HIGH (ref 70–99)
Glucose-Capillary: 108 mg/dL — ABNORMAL HIGH (ref 70–99)
Glucose-Capillary: 115 mg/dL — ABNORMAL HIGH (ref 70–99)
Glucose-Capillary: 117 mg/dL — ABNORMAL HIGH (ref 70–99)
Glucose-Capillary: 119 mg/dL — ABNORMAL HIGH (ref 70–99)
Glucose-Capillary: 120 mg/dL — ABNORMAL HIGH (ref 70–99)
Glucose-Capillary: 160 mg/dL — ABNORMAL HIGH (ref 70–99)

## 2022-01-28 LAB — BASIC METABOLIC PANEL
Anion gap: 6 (ref 5–15)
BUN: 15 mg/dL (ref 6–20)
CO2: 25 mmol/L (ref 22–32)
Calcium: 7.8 mg/dL — ABNORMAL LOW (ref 8.9–10.3)
Chloride: 105 mmol/L (ref 98–111)
Creatinine, Ser: 0.7 mg/dL (ref 0.61–1.24)
GFR, Estimated: >60 mL/min (ref >60)
Glucose, Bld: 100 mg/dL — ABNORMAL HIGH (ref 70–99)
Potassium: 3.9 mmol/L (ref 3.5–5.1)
Sodium: 136 mmol/L (ref 135–145)

## 2022-01-28 LAB — CBC
HCT: 22.8 % — ABNORMAL LOW (ref 39.0–52.0)
Hemoglobin: 7.2 g/dL — ABNORMAL LOW (ref 13.0–17.0)
MCH: 28.3 pg (ref 26.0–34.0)
MCHC: 31.6 g/dL (ref 30.0–36.0)
MCV: 89.8 fL (ref 80.0–100.0)
Platelets: 62 10*3/uL — ABNORMAL LOW (ref 150–400)
RBC: 2.54 MIL/uL — ABNORMAL LOW (ref 4.22–5.81)
RDW: 14.4 % (ref 11.5–15.5)
WBC: 5.9 10*3/uL (ref 4.0–10.5)
nRBC: 0 % (ref 0.0–0.2)

## 2022-01-28 LAB — PHOSPHORUS: Phosphorus: 3.2 mg/dL (ref 2.5–4.6)

## 2022-01-28 LAB — MAGNESIUM: Magnesium: 1.9 mg/dL (ref 1.7–2.4)

## 2022-01-28 NOTE — Assessment & Plan Note (Signed)
Resolved. Patient admitted with toxic metabolic encephalopathy secondary to sepsis and illicit drug use. -Continue to monitor

## 2022-01-28 NOTE — Progress Notes (Signed)
Progress Note   Patient: Earl Moreno JJH:[id number] DOB: 1966/02/25 DOA: 01/21/2022     6 DOS: the patient was seen and examined on 01/28/2022   Brief hospital course: ICU transfer Taken from prior notes. 56 yo M with history of IVDA, recurrent hospitalization and leaving AMA, presenting to [facility] ED on 01/21/22 via EMS complaining that he does not feel well.   Per chart review patient with multiple admissions for sepsis including pyelonephritis, GN bacteremia & suspected osteomyelitis in his right hip as recently as 01/15/22. There have been multiple attempts to treat with IV antibiotics & perform a TEE and rule out endocarditis but the patient continues to leave AMA.  On arrival to ED patient was febrile with leukocytosis, tachycardic, tachypneic, mild hyponatremia, lactic acidosis and elevated procalcitonin.  COVID-19 and influenza PCR was negative.  UDS positive for cocaine and opioids. CXR 01/22/22: no acute cardiopulmonary abnormalities CT head wo contrast 01/22/22: Focal loss of gray-white matter differentiation within the right parietal cortex which may reflect a subacute cortical infarct, though this is not well characterized on this examination. No associated mass effect or midline shift. Contrast enhanced MRI examination is recommended for further evaluation CT chest/abdomen/pelvis w contrast 01/22/22: No acute or inflammatory process identified in the chest. Satisfactory endotracheal and enteric tubes. Abdomen and pelvis remarkable for:- abnormal but nonspecific small to moderate volume of simple appearing free fluid in the pelvis. Paucity of intra-abdominal fat limits bowel detail, but no bowel obstruction or inflammation is evident.- small hypoenhancing areas in the superior spleen, with underlying borderline splenomegaly. Although nonspecific consider small splenic infarcts or hematogenous infection in this setting.- focal bone erosion of the posteroinferior right pubic ramus since last July. But  no other lytic osseous lesion identified, and no regional inflammation. This would be an unusual site for osteomyelitis. Query a metabolic (such as parathyroid) bone disorder. Metastatic disease also not excluded.  patient remained very combative and agitated despite giving multiple doses of Ativan and Haldol, he was started on Precedex and maxed out without much change, resulted in intubation and mechanical ventilation.  Extubated on 01/25/2022.  Patient was found to have Serratia bacteremia with aortic valve endocarditis, infectious disease was consulted and patient is currently on dual coverage with cefepime and ciprofloxacin. Repeat blood cultures on 01/26/2022 are negative in 24 hours.  Patient was also evaluated by [facility] and is not a candidate for surgical intervention.  Psych was also consulted for opioid withdrawal and he was started on methadone on 01/26/2022 at 40 mg daily-dose will be titrated by psych to keep him comfortable so he will not leave another AMA.  Palliative care was also consulted and he was made DNR with current level of care and treat the treatable.  TRH to resume care from 01/27/2022.  9/8: Hemodynamically stable.  Leukocytosis has been resolved.  Slowly decreasing hemoglobin, currently at 7.5, thrombocytopenia with slight improvement to 53.  HIT panel was negative. Anemia panel ordered. Borderline hypoglycemia with CBG in 70s, Semglee was changed to 5 units twice daily, switched Q 4 sliding scale with meal and nighttime coverage.  9/9: Patient was more interactive and alert with a slight decrease in methadone dose.  Repeat blood cultures negative so far. PT/OT recommending SNF Hemoglobin slowly decreasing, at 7.2 today.  Anemia panel with anemia of chronic disease with some iron deficiency.  Patient will need prolonged hospitalization to complete IV antibiotic course secondary to his history of IV drug use.  High risk for deterioration  and  death.   Assessment and Plan: * Septic shock (HCC) Patient initially admitted with severe sepsis and septic shock secondary to Serratia bacteremia and aortic valve endocarditis with his history of IV drug abuse. Multiple recent infections including pyelonephritis and osteomyelitis which were not treated adequately as he was keeps leaving AMA. Patient was intubated and required pressors in the beginning.  Extubated on 01/25/2022 and was weaned off from pressors. Blood pressure still on softer side. Repeat blood cultures on 9/7 remain negative in 24-hour. ID is on board. -Continue with cefepime and ciprofloxacin as recommended by ID for dual coverage -Patient will need long-term antibiotics and prolonged hospitalization.  Bacteremia Initial blood cultures positive for Serratia. -See above for management  Aortic valve endocarditis Found to have large aortic valve vegetation with history of Serratia bacteremia and IV drug use. Not a suitable candidate for any surgical intervention according to [facility]acic surgery. -Continue with antibiotic  Altered mental status Resolved. Patient admitted with toxic metabolic encephalopathy secondary to sepsis and illicit drug use. 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  No abscess or overlying ulcer.  Positive blood cultures, most likely blood seeding. -On appropriate antibiotics  Essential hypertension Patient was on amlodipine at home.  Admitted with septic shock requiring pressors. Blood pressure still on softer side-weaned off from pressors. -Keep holding home amlodipine -Current continue to monitor  Acute HFrEF (heart failure with reduced ejection fraction) (HCC) EF of 45 to 50% with global hypokinesis.  Decreased from his prior echocardiogram done in July 2023.  Most likely secondary to aortic endocarditis. Clinically appears  euvolemic. 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 Psych was consulted for concern of opioid withdrawal and he was started on methadone to prevent another AMA. -Continue with methadone-psych will titrate as needed. -Continue to monitor for opioid withdrawal  Type 2 diabetes mellitus (HCC) Mild hypoglycemia with CBG in 70s. -Check A1c-elevated in July. -Make Semglee 5 units twice daily instead of 10 at night. -Switch SSI with moderate scale with meals. -We will add mealtime coverage if needed.  Normocytic anemia Worsening hemoglobin, at 7.2 today.  No obvious bleeding.  Most likely secondary to bone marrow suppression with sepsis. Anemia panel shows anemia of chronic disease with some iron deficiency. B12 levels pending. -Start him on p.o. iron supplement.  Thrombocytopenia (HCC) Patient developed sudden onset thrombocytopenia which nider at platelets of 43, now started improving slowly.  HIT panel was negative. DIC labs were negative. Most likely secondary to bone marrow suppression with sepsis.  No obvious bleeding. -Continue to monitor  Protein-calorie malnutrition, severe Estimated body mass index is 17.68 kg/m as calculated from the following:   Height as of 01/14/22: 5' 8.11" (1.73 m).   Weight as of this encounter: 52.9 kg.   -Dietitian consult        Subjective: Patient was more alert and interactive and seen today.  No new complaints.  Son at bedside.  Physical Exam: Vitals:   01/28/22 0002 01/28/22 0500 01/28/22 0903 01/28/22 1147  BP: 105/72 (!) 88/45 100/63 (!) 85/55  Pulse: 79 77 70 63  Resp: 18 19 18 16   Temp: 98.4 F (36.9 C) 98.4 F (36.9 C) 98.1 F (36.7 C) (!) 97.5 F (36.4 C)  TempSrc: Oral Oral    SpO2: 97% 99% 100% 99%  Weight:  General.  Malnourished gentleman, in no acute distress. Pulmonary.  Lungs clear bilaterally, normal respiratory  effort. CV.  Regular rate and rhythm, no JVD, rub or murmur. Abdomen.  Soft, nontender, nondistended, BS positive. CNS.  Alert and oriented .  No focal neurologic deficit. Extremities.  No edema, no cyanosis, pulses intact and symmetrical. Psychiatry.  Judgment and insight appears normal.  Data Reviewed: Prior data reviewed  Family Communication: Discussed with son at bedside  Disposition: Status is: Inpatient Remains inpatient appropriate because: Severity of illness, requiring prolonged IV antibiotics   Planned Discharge Destination: To be determined  DVT prophylaxis.  Lovenox Time spent: 50 minutes  This record has been created using Systems analyst. Errors have been sought and corrected,but may not always be located. Such creation errors do not reflect on the standard of care.  Author: [name], MD 01/28/2022 4:18 PM  For on call review [email or web address].

## 2022-01-28 NOTE — Assessment & Plan Note (Signed)
Worsening hemoglobin, at 7.2 today.  No obvious bleeding.  Most likely secondary to bone marrow suppression with sepsis. Anemia panel shows anemia of chronic disease with some iron deficiency. B12 levels pending. -Start him on p.o. iron supplement.

## 2022-01-28 NOTE — Progress Notes (Signed)
The patient is so confused and impulsive climbing out of side rails to go urinate in the trash can and the bathroom floor. He's not redirectable. He's very drowsy as well. He's refusing his night time medications with the exception of  the  IV ABT. Notified Jon Billings NP and received an order for  a sitter for him. Will continue to monitor.

## 2022-01-28 NOTE — Assessment & Plan Note (Signed)
Patient initially admitted with severe sepsis and septic shock secondary to Serratia bacteremia and aortic valve endocarditis with his history of IV drug abuse. Multiple recent infections including pyelonephritis and osteomyelitis which were not treated adequately as he was keeps leaving AMA. Patient was intubated and required pressors in the beginning.  Extubated on 01/25/2022 and was weaned off from pressors. Blood pressure still on softer side. Repeat blood cultures on 9/7 remain negative in 24-hour. ID is on board. -Continue with cefepime and ciprofloxacin as recommended by ID for dual coverage -Patient will need long-term antibiotics and prolonged hospitalization. 

## 2022-01-29 DIAGNOSIS — R7881 Bacteremia: Secondary | ICD-10-CM

## 2022-01-29 DIAGNOSIS — M869 Osteomyelitis, unspecified: Secondary | ICD-10-CM

## 2022-01-29 DIAGNOSIS — I330 Acute and subacute infective endocarditis: Secondary | ICD-10-CM

## 2022-01-29 LAB — BASIC METABOLIC PANEL
Anion gap: 4 — ABNORMAL LOW (ref 5–15)
BUN: 15 mg/dL (ref 6–20)
CO2: 27 mmol/L (ref 22–32)
Calcium: 8 mg/dL — ABNORMAL LOW (ref 8.9–10.3)
Chloride: 106 mmol/L (ref 98–111)
Creatinine, Ser: 0.74 mg/dL (ref 0.61–1.24)
GFR, Estimated: >60 mL/min (ref >60)
Glucose, Bld: 118 mg/dL — ABNORMAL HIGH (ref 70–99)
Potassium: 3.8 mmol/L (ref 3.5–5.1)
Sodium: 137 mmol/L (ref 135–145)

## 2022-01-29 LAB — CBC
HCT: 24.2 % — ABNORMAL LOW (ref 39.0–52.0)
Hemoglobin: 7.7 g/dL — ABNORMAL LOW (ref 13.0–17.0)
MCH: 28.3 pg (ref 26.0–34.0)
MCHC: 31.8 g/dL (ref 30.0–36.0)
MCV: 89 fL (ref 80.0–100.0)
Platelets: 82 10*3/uL — ABNORMAL LOW (ref 150–400)
RBC: 2.72 MIL/uL — ABNORMAL LOW (ref 4.22–5.81)
RDW: 14.2 % (ref 11.5–15.5)
WBC: 8.6 10*3/uL (ref 4.0–10.5)
nRBC: 0 % (ref 0.0–0.2)

## 2022-01-29 LAB — GLUCOSE, CAPILLARY
Glucose-Capillary: 114 mg/dL — ABNORMAL HIGH (ref 70–99)
Glucose-Capillary: 139 mg/dL — ABNORMAL HIGH (ref 70–99)
Glucose-Capillary: 97 mg/dL (ref 70–99)
Glucose-Capillary: 86 mg/dL (ref 70–99)

## 2022-01-29 LAB — PHOSPHORUS: Phosphorus: 3.1 mg/dL (ref 2.5–4.6)

## 2022-01-29 LAB — MAGNESIUM: Magnesium: 1.9 mg/dL (ref 1.7–2.4)

## 2022-01-29 MED ORDER — ORAL CARE MOUTH RINSE: 15.0000 mL | OROMUCOSAL | Status: DC | PRN

## 2022-01-29 NOTE — Progress Notes (Signed)
Patient agitated and refusing vital signs, CBG monitoring, Po medications and CVC and PIV dressing changes again.  PRN haldol given.  RN will reassess later. Patient's son at bedside.

## 2022-01-29 NOTE — Progress Notes (Signed)
Patient agitated, combative and refusing vital signs, PO medications and CVC and PIV dressing changes.  PRN haldol given at 0703 by charge RN.  Sitter at bedside.  MD notified. No new orders given.

## 2022-01-29 NOTE — Progress Notes (Signed)
Progress Note   Patient: Earl Moreno IYM:[id number] DOB: 02/12/1966 DOA: 01/21/2022     7 DOS: the patient was seen and examined on 01/29/2022   Brief hospital course: ICU transfer Taken from prior notes. 56 yo M with history of IVDA, recurrent hospitalization and leaving AMA, presenting to [facility] ED on 01/21/22 via EMS complaining that he does not feel well.   Per chart review patient with multiple admissions for sepsis including pyelonephritis, GN bacteremia & suspected osteomyelitis in his right hip as recently as 01/15/22. There have been multiple attempts to treat with IV antibiotics & perform a TEE and rule out endocarditis but the patient continues to leave AMA.  On arrival to ED patient was febrile with leukocytosis, tachycardic, tachypneic, mild hyponatremia, lactic acidosis and elevated procalcitonin.  COVID-19 and influenza PCR was negative.  UDS positive for cocaine and opioids. CXR 01/22/22: no acute cardiopulmonary abnormalities CT head wo contrast 01/22/22: Focal loss of gray-white matter differentiation within the right parietal cortex which may reflect a subacute cortical infarct, though this is not well characterized on this examination. No associated mass effect or midline shift. Contrast enhanced MRI examination is recommended for further evaluation CT chest/abdomen/pelvis w contrast 01/22/22: No acute or inflammatory process identified in the chest. Satisfactory endotracheal and enteric tubes. Abdomen and pelvis remarkable for:- abnormal but nonspecific small to moderate volume of simple appearing free fluid in the pelvis. Paucity of intra-abdominal fat limits bowel detail, but no bowel obstruction or inflammation is evident.- small hypoenhancing areas in the superior spleen, with underlying borderline splenomegaly. Although nonspecific consider small splenic infarcts or hematogenous infection in this setting.- focal bone erosion of the posteroinferior right pubic ramus since last July.  But no other lytic osseous lesion identified, and no regional inflammation. This would be an unusual site for osteomyelitis. Query a metabolic (such as parathyroid) bone disorder. Metastatic disease also not excluded.  patient remained very combative and agitated despite giving multiple doses of Ativan and Haldol, he was started on Precedex and maxed out without much change, resulted in intubation and mechanical ventilation.  Extubated on 01/25/2022.  Patient was found to have Serratia bacteremia with aortic valve endocarditis, infectious disease was consulted and patient is currently on dual coverage with cefepime and ciprofloxacin. Repeat blood cultures on 01/26/2022 are negative in 24 hours.  Patient was also evaluated by [facility] and is not a candidate for surgical intervention.  Psych was also consulted for opioid withdrawal and he was started on methadone on 01/26/2022 at 40 mg daily-dose will be titrated by psych to keep him comfortable so he will not leave another AMA.  Palliative care was also consulted and he was made DNR with current level of care and treat the treatable.  TRH to resume care from 01/27/2022.  9/8: Hemodynamically stable.  Leukocytosis has been resolved.  Slowly decreasing hemoglobin, currently at 7.5, thrombocytopenia with slight improvement to 53.  HIT panel was negative. Anemia panel ordered. Borderline hypoglycemia with CBG in 70s, Semglee was changed to 5 units twice daily, switched Q 4 sliding scale with meal and nighttime coverage.  9/9: Patient was more interactive and alert with a slight decrease in methadone dose.  Repeat blood cultures negative so far. PT/OT recommending SNF Hemoglobin slowly decreasing, at 7.2 today.  Anemia panel with anemia of chronic disease with some iron deficiency.  9/10: Patient becoming more combative and refusing all the p.o. meds.  Received Haldol around 7 AM. Labs with some improvement in hemoglobin  to 7.7 and  platelets to 82.  Patient will need prolonged hospitalization to complete IV antibiotic course secondary to his history of IV drug use.  High risk for deterioration and death.   Assessment and Plan: * Septic shock (HCC) Patient initially admitted with severe sepsis and septic shock secondary to Serratia bacteremia and aortic valve endocarditis with his history of IV drug abuse. Multiple recent infections including pyelonephritis and osteomyelitis which were not treated adequately as he was keeps leaving AMA. Patient was intubated and required pressors in the beginning.  Extubated on 01/25/2022 and was weaned off from pressors. Blood pressure still on softer side. Repeat blood cultures on 9/7 remain negative in 24-hour. ID is on board. -Continue with cefepime and ciprofloxacin as recommended by ID for dual coverage -Patient will need long-term antibiotics and prolonged hospitalization.  Bacteremia Initial blood cultures positive for Serratia. -See above for management  Aortic valve endocarditis Found to have large aortic valve vegetation with history of Serratia bacteremia and IV drug use. Not a suitable candidate for any surgical intervention according to [facility] cardiothoracic surgery. -Continue with antibiotic  Altered mental status Patient with some agitation this morning requiring Haldol. Patient admitted with toxic metabolic encephalopathy secondary to sepsis and illicit drug use. 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  No abscess or overlying ulcer.  Positive blood cultures, most likely blood seeding. -On appropriate antibiotics  Essential hypertension Patient was on amlodipine at home.  Admitted with septic shock requiring pressors. Blood pressure still on softer side-weaned off from pressors. -Keep holding home amlodipine -Current continue to monitor  Acute HFrEF (heart failure  with reduced ejection fraction) (HCC) EF of 45 to 50% with global hypokinesis.  Decreased from his prior echocardiogram done in July 2023.  Most likely secondary to aortic endocarditis. Clinically appears euvolemic. 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 Psych was consulted for concern of opioid withdrawal and he was started on methadone to prevent another AMA. -Continue with methadone-psych will titrate as needed. -Continue to monitor for opioid withdrawal  Type 2 diabetes mellitus (HCC) Mild hypoglycemia with CBG in 70s. -Check A1c-elevated in July. -Make Semglee 5 units twice daily instead of 10 at night. -Switch SSI with moderate scale with meals. -We will add mealtime coverage if needed.  Normocytic anemia Worsening hemoglobin, at 7.2 today.  No obvious bleeding.  Most likely secondary to bone marrow suppression with sepsis. Anemia panel shows anemia of chronic disease with some iron deficiency. B12 levels pending. -Start him on p.o. iron supplement.  Thrombocytopenia (HCC) Patient developed sudden onset thrombocytopenia which nider at platelets of 43, now started improving slowly.  HIT panel was negative. DIC labs were negative. Most likely secondary to bone marrow suppression with sepsis.  No obvious bleeding. -Continue to monitor  Protein-calorie malnutrition, severe Estimated body mass index is 17.68 kg/m as calculated from the following:   Height as of 01/14/22: 5' 8.11" (1.73 m).   Weight as of this encounter: 52.9 kg.   -Dietitian consult        Subjective: Patient was agitated and combative earlier in the morning requiring Haldol.  He was much calmer and little somnolent but easily arousable during morning rounds.  No complaints.  Son at bedside  Physical Exam: Vitals:   01/29/22 0407 01/29/22 0409 01/29/22 0737 01/29/22 1106  BP: 112/65  (!) 97/52 Marland Kitchen)  94/50   Pulse: 74  73 72  Resp: 18  18 18   Temp:  98.4 F (36.9 C) 97.7 F (36.5 C) (!) 97.5 F (36.4 C)  TempSrc:  Axillary Oral Oral  SpO2: 96%  99% 94%  Weight:       General.  Malnourished gentleman, in no acute distress. Pulmonary.  Lungs clear bilaterally, normal respiratory effort. CV.  Regular rate and rhythm, no JVD, rub or murmur. Abdomen.  Soft, nontender, nondistended, BS positive. CNS.  Alert and oriented .  No focal neurologic deficit. Extremities.  No edema, no cyanosis, pulses intact and symmetrical. Psychiatry.  Judgment and insight appears normal.  Data Reviewed: Prior data reviewed  Family Communication: Discussed with son at bedside  Disposition: Status is: Inpatient Remains inpatient appropriate because: Severity of illness.  Will need prolonged IV antibiotics.   Planned Discharge Destination: Home  DVT prophylaxis.  Lovenox Time spent: 43 minutes  This record has been created using . Errors have been sought and corrected,but may not always be located. Such creation errors do not reflect on the standard of care.  Author: Conservation officer, historic buildings, MD 01/29/2022 3:02 PM  For on call review [email or web address].

## 2022-01-29 NOTE — Assessment & Plan Note (Signed)
Patient with some agitation this morning requiring Haldol. Patient admitted with toxic metabolic encephalopathy secondary to sepsis and illicit drug use. -Continue to monitor

## 2022-01-29 NOTE — Progress Notes (Signed)
Patient agreeable to PO tylenol and methadone only after multiple attempts and conversations with RN, sitter, and patients son at bedside.  Patient refusing all other PO medications, lidocaine patch and CVC and PIV dressing changes.

## 2022-01-30 LAB — GLUCOSE, CAPILLARY
Glucose-Capillary: 114 mg/dL — ABNORMAL HIGH (ref 70–99)
Glucose-Capillary: 119 mg/dL — ABNORMAL HIGH (ref 70–99)
Glucose-Capillary: 122 mg/dL — ABNORMAL HIGH (ref 70–99)
Glucose-Capillary: 150 mg/dL — ABNORMAL HIGH (ref 70–99)
Glucose-Capillary: 85 mg/dL (ref 70–99)
Glucose-Capillary: 94 mg/dL (ref 70–99)

## 2022-01-30 MED ORDER — SODIUM CHLORIDE 0.9 % IV SOLN: INTRAVENOUS | Status: DC | PRN

## 2022-01-30 NOTE — Progress Notes (Signed)
   Date of Admission:  01/21/2022     ID: Earl Moreno is a 56 y.o. 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)   Essential hypertension   Protein-calorie malnutrition, severe   Altered mental status   Bacteremia   Aortic valve endocarditis   Acute HFrEF (heart failure with reduced ejection fraction) (HCC)   Osteomyelitis (HCC)   Normocytic anemia   Thrombocytopenia (HCC)    Subjective: Pt is a little more alert today Says okay   Medications:   acetaminophen  975 mg Oral Q6H   Chlorhexidine Gluconate Cloth  6 each Topical Daily   enoxaparin (LOVENOX) injection  40 mg Subcutaneous Q24H   feeding supplement  237 mL Oral TID BM   ferrous fumarate-b12-vitamic C-folic acid  1 capsule Oral BID PC   insulin aspart  0-15 Units Subcutaneous TID WC   insulin aspart  0-5 Units Subcutaneous QHS   lidocaine  1 patch Transdermal Q24H   methadone  30 mg Oral Daily   multivitamin with minerals  1 tablet Oral Daily   pantoprazole  40 mg Oral Daily    Objective: Vital signs in last 24 hours: Temp:  [96.8 F (36 C)-98.3 F (36.8 C)] 98.2 F (36.8 C) (09/11 1653) Pulse Rate:  [67-81] 67 (09/11 1653) Resp:  [16-18] 16 (09/11 1653) BP: (96-122)/(59-98) 96/59 (09/11 1653) SpO2:  [95 %-100 %] 99 % (09/11 1653) Weight:  [41.1 kg] 41.1 kg (09/11 0500)    PHYSICAL EXAM:  General: lethargic but responds to questione Lungs: b/l air entry Heart: s1s2 Abdomen: Soft, non-tender,not distended. Bowel sounds normal. No masses Neurologic: moves all extremities  Lab Results Recent Labs    01/28/22 0625 01/29/22 0550  WBC 5.9 8.6  HGB 7.2* 7.7*  HCT 22.8* 24.2*  NA 136 137  K 3.9 3.8  CL 105 106  CO2 25 27  BUN 15 15  CREATININE 0.70 0.74     Microbiology: 01/22/22 - serratia 01/26/22 BC neg    Assessment/Plan: Encephalopathy secondary to sepsis  and infarct - improved But now it is more influenced by the meds ?   Serratia bacteremia with aortic valve endocarditis On dual coverage with cefepime + cipro Repeat blood culture neg Will need for 6 weeks-- 2022-03-08  IVDA  heroin user - now on methadone  30mg   -No resp distress Because of lethargy may need to reduce the dose further     Osteomyelitis rt pubic ramus Previous bacteremias including serratia , enterobacter- not treated because of him leaving AMA ? _Anemia   Thrombocytopenia  Discussed the management with care team

## 2022-01-30 NOTE — Progress Notes (Signed)
Occupational Therapy Treatment Patient Details Name: Earl Moreno MRN: [id number] DOB: June 12, 1965 Today's Date: 01/30/2022   History of present illness Pt is a 56 y.o. male  with past medical history of multiple admissions for sepsis including pyelonephritis, GN bacteremia and suspected osteomyelitis in his right hip as recently as 01/15/2022 with multiple attempts to treat with IV antibiotics but patient continues to leave AMA, history of heroin abuse, DM 2, splenic infarct, admitted on 01/21/2022 with septic shock initially requiring intubation/ventilation.   OT comments  Pt in bed upon arrival and son present in room also. Pt unable to follow simple commands consistently and only oriented to self; requiring frequent multimodal cues throughout session. Pt required +2 min-mod A sit <>stand transfers and supervision for bed mobility. When attempting to assist in transfers patient stated "yall are trying to f*ck me up, but I'm not mad at you." Pt declined grooming task. Pt observed not WB on RLE with c/o pain in right foot. RN notified pt requesting pain medication. Patient left in bed, with bed alarm set and call bell in reach.    Recommendations for follow up therapy are one component of a multi-disciplinary discharge planning process, led by the attending physician.  Recommendations may be updated based on patient status, additional functional criteria and insurance authorization.    Follow Up Recommendations  Skilled nursing-short term rehab (<3 hours/day)    Assistance Recommended at Discharge Frequent or constant Supervision/Assistance  Patient can return home with the following  A lot of help with walking and/or transfers;A lot of help with bathing/dressing/bathroom;Assistance with cooking/housework;Direct supervision/assist for medications management;Direct supervision/assist for financial management;Assist for transportation   Equipment Recommendations  Other (comment) (Defer to next venue  of care.)    Recommendations for Other Services      Precautions / Restrictions Precautions Precautions: Fall Restrictions Weight Bearing Restrictions: No       Mobility Bed Mobility Overal bed mobility: Needs Assistance Bed Mobility: Supine to Sit, Sit to Supine     Supine to sit: Supervision Sit to supine: Supervision   General bed mobility comments: Required multimodal cues.    Transfers Overall transfer level: Needs assistance Equipment used: Rolling walker (2 wheels), 2 person hand held assist Transfers: Sit to/from Stand Sit to Stand: Mod assist, +2 physical assistance, Min assist                 Balance Overall balance assessment: Needs assistance Sitting-balance support: Feet supported, Bilateral upper extremity supported Sitting balance-Leahy Scale: Good       Standing balance-Leahy Scale: Poor                             ADL either performed or assessed with clinical judgement   ADL Overall ADL's : Needs assistance/impaired                                       General ADL Comments: Patient declined grooming task during eval.    Extremity/Trunk Assessment Upper Extremity Assessment Upper Extremity Assessment: Generalized weakness;LUE deficits/detail   Lower Extremity Assessment Lower Extremity Assessment: Generalized weakness;RLE deficits/detail         Cognition Arousal/Alertness: Lethargic Behavior During Therapy: Flat affect, Restless (emotionally labile) Overall Cognitive Status: Impaired/Different from baseline Area of Impairment: Orientation, Attention, Memory, Following commands, Safety/judgement, Awareness, Problem solving  Orientation Level: Person Current Attention Level: Focused   Following Commands: Follows one step commands inconsistently, Follows one step commands with increased time Safety/Judgement: Decreased awareness of safety, Decreased awareness of  deficits Awareness: Intellectual Problem Solving: Slow processing, Decreased initiation, Requires verbal cues, Requires tactile cues, Difficulty sequencing                     Pertinent Vitals/ Pain       Pain Assessment Pain Assessment: Faces Faces Pain Scale: Hurts even more (top of right foot.)   Frequency  Min 2X/week        Progress Toward Goals  OT Goals(current goals can now be found in the care plan section)  Progress towards OT goals: Progressing toward goals  Acute Rehab OT Goals OT Goal Formulation: Patient unable to participate in goal setting  Plan Discharge plan remains appropriate;Frequency remains appropriate    Co-evaluation    PT/OT/SLP Co-Evaluation/Treatment: Yes Reason for Co-Treatment: Complexity of the patient's impairments (multi-system involvement);For patient/therapist safety;Necessary to address cognition/behavior during functional activity;To address functional/ADL transfers PT goals addressed during session: Mobility/safety with mobility;Proper use of DME OT goals addressed during session: ADL's and self-care      AM-PAC OT "6 Clicks" Daily Activity     Outcome Measure   Help from another person eating meals?: A Little Help from another person taking care of personal grooming?: A Lot Help from another person toileting, which includes using toliet, bedpan, or urinal?: A Lot Help from another person bathing (including washing, rinsing, drying)?: A Lot Help from another person to put on and taking off regular upper body clothing?: A Lot Help from another person to put on and taking off regular lower body clothing?: A Lot 6 Click Score: 13    End of Session Equipment Utilized During Treatment: Rolling walker (2 wheels)  OT Visit Diagnosis: Unsteadiness on feet (R26.81);Muscle weakness (generalized) (M62.81)   Activity Tolerance Patient limited by fatigue;Patient limited by lethargy;Patient limited by pain   Patient Left in bed;with  call bell/phone within reach;with bed alarm set   Nurse Communication Mobility status        Time: 7628-3151 OT Time Calculation (min): 24 min  Charges: OT General Charges $OT Visit: 1 Visit OT Treatments $Therapeutic Activity: 8-22 mins    Gladiola Madore, OTS 01/30/2022, 4:08 PM

## 2022-01-30 NOTE — Assessment & Plan Note (Signed)
Patient with intermittent agitation and delirium requiring Haldol. Patient admitted with toxic metabolic encephalopathy secondary to sepsis and illicit drug use. -Continue to monitor

## 2022-01-30 NOTE — Progress Notes (Signed)
Physical Therapy Treatment Patient Details Name: Earl Moreno MRN: [id number] DOB: [birth year] Today's Date: 01/30/2022   History of Present Illness Pt is a 56 y.o. male  with past medical history of multiple admissions for sepsis including pyelonephritis, GN bacteremia and suspected osteomyelitis in his right hip as recently as 01/15/2022 with multiple attempts to treat with IV antibiotics but patient continues to leave AMA, history of heroin abuse, DM 2, splenic infarct, admitted on 01/21/2022 with septic shock initially requiring intubation/ventilation.    PT Comments    Patient seen as PT/OT co-treat to maximize pt/therapist safety and function. Pt emotionally labile throughout, needed significant time to attend to task and able to follow simple one step intermittently and with extended time. Pt did perform bed mobility with extensive cueing with supervision. Sit <> stand with RW and CGA to stand, unable to ambulate more than 1 step forward. Tended to keep his RLE as TTWB, eventually did tell PT that it was due to R foot pain. Min-modAx2 to return to sitting safely at EOB (pt attempted to sit without a surface behind him). Returned to supine, and RN notified of pt status. The patient would benefit from further skilled PT intervention to continue to progress towards goals. Recommendation remains appropriate.     Recommendations for follow up therapy are one component of a multi-disciplinary discharge planning process, led by the attending physician.  Recommendations may be updated based on patient status, additional functional criteria and insurance authorization.  Follow Up Recommendations  Skilled nursing-short term rehab (<3 hours/day) Can patient physically be transported by private vehicle: No   Assistance Recommended at Discharge Frequent or constant Supervision/Assistance  Patient can return home with the following Two people to help with walking and/or transfers;Two people to help with  bathing/dressing/bathroom;Help with stairs or ramp for entrance;Direct supervision/assist for medications management;Assist for transportation;Assistance with cooking/housework;Direct supervision/assist for financial management   Equipment Recommendations  Other (comment) (TBD)    Recommendations for Other Services       Precautions / Restrictions Precautions Precautions: Fall Restrictions Weight Bearing Restrictions: No     Mobility  Bed Mobility Overal bed mobility: Needs Assistance Bed Mobility: Supine to Sit, Sit to Supine     Supine to sit: Supervision Sit to supine: Supervision   General bed mobility comments: Required multimodal cues.    Transfers Overall transfer level: Needs assistance Equipment used: Rolling walker (2 wheels), 2 person hand held assist Transfers: Sit to/from Stand Sit to Stand: Mod assist, +2 physical assistance, Min assist                Ambulation/Gait               General Gait Details: pt unable to ambulate at this time. 1 step forward. extended time, very limited by pain   Stairs             Wheelchair Mobility    Modified Rankin (Stroke Patients Only)       Balance Overall balance assessment: Needs assistance Sitting-balance support: Feet supported, Bilateral upper extremity supported Sitting balance-Leahy Scale: Good     Standing balance support: Bilateral upper extremity supported, During functional activity, Reliant on assistive device for balance Standing balance-Leahy Scale: Poor                              Cognition Arousal/Alertness: Lethargic Behavior During Therapy: Flat affect, Restless (emotionally labile) Overall Cognitive Status: Impaired/Different from baseline  Area of Impairment: Orientation, Attention, Memory, Following commands, Safety/judgement, Awareness, Problem solving                 Orientation Level: Person Current Attention Level: Focused Memory: Decreased  short-term memory Following Commands: Follows one step commands inconsistently, Follows one step commands with increased time Safety/Judgement: Decreased awareness of safety, Decreased awareness of deficits Awareness: Intellectual Problem Solving: Slow processing, Decreased initiation, Requires verbal cues, Requires tactile cues, Difficulty sequencing          Exercises      General Comments        Pertinent Vitals/Pain Pain Assessment Pain Assessment: Faces Faces Pain Scale: Hurts even more Pain Location: pts top of R foot Pain Descriptors / Indicators: Grimacing, Crying, Guarding, Moaning Pain Intervention(s): Limited activity within patient's tolerance, Monitored during session, Repositioned, Patient requesting pain meds-RN notified    Home Living                          Prior Function            PT Goals (current goals can now be found in the care plan section) Progress towards PT goals: Progressing toward goals    Frequency    Min 2X/week      PT Plan Current plan remains appropriate    Co-evaluation PT/OT/SLP Co-Evaluation/Treatment: Yes Reason for Co-Treatment: Complexity of the patient's impairments (multi-system involvement);For patient/therapist safety;Necessary to address cognition/behavior during functional activity;To address functional/ADL transfers PT goals addressed during session: Mobility/safety with mobility;Proper use of DME OT goals addressed during session: ADL's and self-care      AM-PAC PT "6 Clicks" Mobility   Outcome Measure  Help needed turning from your back to your side while in a flat bed without using bedrails?: None Help needed moving from lying on your back to sitting on the side of a flat bed without using bedrails?: None Help needed moving to and from a bed to a chair (including a wheelchair)?: A Lot Help needed standing up from a chair using your arms (e.g., wheelchair or bedside chair)?: A Lot Help needed to  walk in hospital room?: Total Help needed climbing 3-5 steps with a railing? : Total 6 Click Score: 14    End of Session   Activity Tolerance: Patient tolerated treatment well Patient left: in bed;with call bell/phone within reach;with bed alarm set Nurse Communication: Mobility status;Other (comment) (pain in RLE) PT Visit Diagnosis: Muscle weakness (generalized) (M62.81);Difficulty in walking, not elsewhere classified (R26.2);Unsteadiness on feet (R26.81)     Time: 6063-0160 PT Time Calculation (min) (ACUTE ONLY): 20 min  Charges:  $Therapeutic Activity: 8-22 mins                     Olga Coaster PT, DPT 4:11 PM,01/30/22

## 2022-01-30 NOTE — Progress Notes (Signed)
Progress Note   Patient: Earl Moreno DOB: 12/06/1965 DOA: 01/21/2022     8 DOS: the patient was seen and examined on 01/30/2022   Brief hospital course: ICU transfer Taken from prior notes. 56 yo M with history of IVDA, recurrent hospitalization and leaving AMA, presenting to [facility] ED on 01/21/22 via EMS complaining that he does not feel well.   Per chart review patient with multiple admissions for sepsis including pyelonephritis, GN bacteremia & suspected osteomyelitis in his right hip as recently as 01/15/22. There have been multiple attempts to treat with IV antibiotics & perform a TEE and rule out endocarditis but the patient continues to leave AMA.  On arrival to ED patient was febrile with leukocytosis, tachycardic, tachypneic, mild hyponatremia, lactic acidosis and elevated procalcitonin.  COVID-19 and influenza PCR was negative.  UDS positive for cocaine and opioids. CXR 01/22/22: no acute cardiopulmonary abnormalities CT head wo contrast 01/22/22: Focal loss of gray-white matter differentiation within the right parietal cortex which may reflect a subacute cortical infarct, though this is not well characterized on this examination. No associated mass effect or midline shift. Contrast enhanced MRI examination is recommended for further evaluation CT chest/abdomen/pelvis w contrast 01/22/22: No acute or inflammatory process identified in the chest. Satisfactory endotracheal and enteric tubes. Abdomen and pelvis remarkable for:- abnormal but nonspecific small to moderate volume of simple appearing free fluid in the pelvis. Paucity of intra-abdominal fat limits bowel detail, but no bowel obstruction or inflammation is evident.- small hypoenhancing areas in the superior spleen, with underlying borderline splenomegaly. Although nonspecific consider small splenic infarcts or hematogenous infection in this setting.- focal bone erosion of the posteroinferior right pubic ramus since last July.  But no other lytic osseous lesion identified, and no regional inflammation. This would be an unusual site for osteomyelitis. Query a metabolic (such as parathyroid) bone disorder. Metastatic disease also not excluded.  patient remained very combative and agitated despite giving multiple doses of Ativan and Haldol, he was started on Precedex and maxed out without much change, resulted in intubation and mechanical ventilation.  Extubated on 01/25/2022.  Patient was found to have Serratia bacteremia with aortic valve endocarditis, infectious disease was consulted and patient is currently on dual coverage with cefepime and ciprofloxacin. Repeat blood cultures on 01/26/2022 are negative in 24 hours.  Patient was also evaluated by [facility] and is not a candidate for surgical intervention.  Psych was also consulted for opioid withdrawal and he was started on methadone on 01/26/2022 at 40 mg daily-dose will be titrated by psych to keep him comfortable so he will not leave another AMA.  Palliative care was also consulted and he was made DNR with current level of care and treat the treatable.  TRH to resume care from 01/27/2022.  9/8: Hemodynamically stable.  Leukocytosis has been resolved.  Slowly decreasing hemoglobin, currently at 7.5, thrombocytopenia with slight improvement to 53.  HIT panel was negative. Anemia panel ordered. Borderline hypoglycemia with CBG in 70s, Semglee was changed to 5 units twice daily, switched Q 4 sliding scale with meal and nighttime coverage.  9/9: Patient was more interactive and alert with a slight decrease in methadone dose.  Repeat blood cultures negative so far. PT/OT recommending SNF Hemoglobin slowly decreasing, at 7.2 today.  Anemia panel with anemia of chronic disease with some iron deficiency.  9/10: Patient becoming more combative and refusing all the p.o. meds.  Received Haldol around 7 AM. Labs with some improvement in hemoglobin  to 7.7 and  platelets to 82.  9/11: Patient intermittently becoming agitated requiring Haldol.  Patient will need prolonged hospitalization to complete IV antibiotic course secondary to his history of IV drug use.  High risk for deterioration and death.   Assessment and Plan: * Septic shock (HCC) Patient initially admitted with severe sepsis and septic shock secondary to Serratia bacteremia and aortic valve endocarditis with his history of IV drug abuse. Multiple recent infections including pyelonephritis and osteomyelitis which were not treated adequately as he was keeps leaving AMA. Patient was intubated and required pressors in the beginning.  Extubated on 01/25/2022 and was weaned off from pressors. Blood pressure still on softer side. Repeat blood cultures on 9/7 remain negative in 24-hour. ID is on board. -Continue with cefepime and ciprofloxacin as recommended by ID for dual coverage -Patient will need long-term antibiotics and prolonged hospitalization.  Bacteremia Initial blood cultures positive for Serratia. -See above for management  Aortic valve endocarditis Found to have large aortic valve vegetation with history of Serratia bacteremia and IV drug use. Not a suitable candidate for any surgical intervention according to [facility] cardiothoracic surgery. -Continue with antibiotic  Altered mental status Patient with intermittent agitation and delirium requiring Haldol. Patient admitted with toxic metabolic encephalopathy secondary to sepsis and illicit drug use. 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  No abscess or overlying ulcer.  Positive blood cultures, most likely blood seeding. -On appropriate antibiotics  Essential hypertension Patient was on amlodipine at home.  Admitted with septic shock requiring pressors. Blood pressure still on softer side-weaned off from pressors. -Keep holding  home amlodipine -Current continue to monitor  Acute HFrEF (heart failure with reduced ejection fraction) (HCC) EF of 45 to 50% with global hypokinesis.  Decreased from his prior echocardiogram done in July 2023.  Most likely secondary to aortic endocarditis. Clinically appears euvolemic. 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 Psych was consulted for concern of opioid withdrawal and he was started on methadone to prevent another AMA. -Continue with methadone-psych will titrate as needed. -Continue to monitor for opioid withdrawal  Type 2 diabetes mellitus (HCC) Mild hypoglycemia with CBG in 70s. -Check A1c-elevated in July. -Make Semglee 5 units twice daily instead of 10 at night. -Switch SSI with moderate scale with meals. -We will add mealtime coverage if needed.  Normocytic anemia Worsening hemoglobin, at 7.2 today.  No obvious bleeding.  Most likely secondary to bone marrow suppression with sepsis. Anemia panel shows anemia of chronic disease with some iron deficiency. B12 levels pending. -Start him on p.o. iron supplement.  Thrombocytopenia (HCC) Patient developed sudden onset thrombocytopenia which nider at platelets of 43, now started improving slowly.  HIT panel was negative. DIC labs were negative. Most likely secondary to bone marrow suppression with sepsis.  No obvious bleeding. -Continue to monitor  Protein-calorie malnutrition, severe Estimated body mass index is 17.68 kg/m as calculated from the following:   Height as of 01/14/22: 5' 8.11" (1.73 m).   Weight as of this encounter: 52.9 kg.   -Dietitian consult     Subjective: Patient was seen and examined today.  Appears little somnolent but easily arousable.  Answers questions appropriately during morning rounds.  Son at bedside.  Physical Exam: Vitals:   01/30/22 0416 01/30/22 0500 01/30/22 0734 01/30/22 1131   BP: 116/88  (!) 122/98 105/71  Pulse: 74  76 73  Resp: 16  18 18   Temp: 98.2 F (36.8 C)  98.3 F (36.8 C) (!) 96.8 F (36 C)  TempSrc: Oral     SpO2: 95%  100% 100%  Weight:  41.1 kg     General.  Severely malnourished gentleman, some in no acute distress. Pulmonary.  Lungs clear bilaterally, normal respiratory effort. CV.  Regular rate and rhythm, no JVD, rub or murmur. Abdomen.  Soft, nontender, nondistended, BS positive. CNS.  Alert and oriented .  No focal neurologic deficit. Extremities.  No edema, no cyanosis, pulses intact and symmetrical. Psychiatry.  Appears to have some cognitive impairment  Data Reviewed: Prior data reviewed  Family Communication: Discussed with son at bedside  Disposition: Status is: Inpatient Remains inpatient appropriate because: Prolonged hospitalization for IV antibiotic.  History of IV drug use.   Planned Discharge Destination: To be determined  DVT prophylaxis.  Lovenox Time spent: 43 minutes  This record has been created using . Errors have been sought and corrected,but may not always be located. Such creation errors do not reflect on the standard of care.  Author: Conservation officer, historic buildings, MD 01/30/2022 12:51 PM  For on call review [email or web address].
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
## 2022-01-30 NOTE — Progress Notes (Signed)
Sharen Heck (family) is asking Dr. Nelson Chimes to call her for update with phone # [phone].

## 2022-01-31 DIAGNOSIS — A419 Sepsis, unspecified organism: Secondary | ICD-10-CM

## 2022-01-31 DIAGNOSIS — R6521 Severe sepsis with septic shock: Secondary | ICD-10-CM

## 2022-01-31 LAB — URINE DRUG SCREEN, QUALITATIVE (ARMC ONLY)
Amphetamines, Ur Screen: NOT DETECTED
Barbiturates, Ur Screen: NOT DETECTED
Benzodiazepine, Ur Scrn: NOT DETECTED
Cannabinoid 50 Ng, Ur ~~LOC~~: NOT DETECTED
Cocaine Metabolite,Ur ~~LOC~~: NOT DETECTED
MDMA (Ecstasy)Ur Screen: NOT DETECTED
Methadone Scn, Ur: POSITIVE — AB
Opiate, Ur Screen: NOT DETECTED
Phencyclidine (PCP) Ur S: NOT DETECTED
Tricyclic, Ur Screen: NOT DETECTED

## 2022-01-31 LAB — GLUCOSE, CAPILLARY
Glucose-Capillary: 93 mg/dL (ref 70–99)
Glucose-Capillary: 194 mg/dL — ABNORMAL HIGH (ref 70–99)
Glucose-Capillary: 98 mg/dL (ref 70–99)
Glucose-Capillary: 74 mg/dL (ref 70–99)

## 2022-01-31 LAB — CULTURE, BLOOD (ROUTINE X 2)
Culture: NO GROWTH
Culture: NO GROWTH
Special Requests: ADEQUATE

## 2022-01-31 MED ORDER — METHADONE HCL 10 MG PO TABS
25.0000 mg | ORAL_TABLET | Freq: Every day | ORAL | Status: DC
  Administered 2022-02-01 – 2022-02-03 (×3): 25 mg via ORAL
  Filled 2022-01-31 (×3): qty 3

## 2022-01-31 MED ORDER — HALOPERIDOL LACTATE 5 MG/ML IJ SOLN
1.0000 mg | Freq: Four times a day (QID) | INTRAMUSCULAR | Status: DC | PRN
  Administered 2022-02-22 – 2022-02-23 (×4): 1 mg via INTRAVENOUS
  Filled 2022-01-31 (×4): qty 1
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
## 2022-01-31 NOTE — Assessment & Plan Note (Signed)
History of IV opioid use for many years. Psych was consulted for concern of opioid withdrawal and he was started on methadone to prevent another AMA. -Continue with methadone-psych will titrate as needed. -Continue to monitor for opioid withdrawal

## 2022-01-31 NOTE — Progress Notes (Signed)
Progress Note   Patient: Earl Moreno:[id number] DOB: 04/24/1966 DOA: 01/21/2022     9 DOS: the patient was seen and examined on 01/31/2022   Brief hospital course: ICU transfer Taken from prior notes. 56 yo M with history of IVDA, recurrent hospitalization and leaving AMA, presenting to [facility] ED on 01/21/22 via EMS complaining that he does not feel well.   Per chart review patient with multiple admissions for sepsis including pyelonephritis, GN bacteremia & suspected osteomyelitis in his right hip as recently as 01/15/22. There have been multiple attempts to treat with IV antibiotics & perform a TEE and rule out endocarditis but the patient continues to leave AMA.  On arrival to ED patient was febrile with leukocytosis, tachycardic, tachypneic, mild hyponatremia, lactic acidosis and elevated procalcitonin.  COVID-19 and influenza PCR was negative.  UDS positive for cocaine and opioids. CXR 01/22/22: no acute cardiopulmonary abnormalities CT head wo contrast 01/22/22: Focal loss of gray-white matter differentiation within the right parietal cortex which may reflect a subacute cortical infarct, though this is not well characterized on this examination. No associated mass effect or midline shift. Contrast enhanced MRI examination is recommended for further evaluation CT chest/abdomen/pelvis w contrast 01/22/22: No acute or inflammatory process identified in the chest. Satisfactory endotracheal and enteric tubes. Abdomen and pelvis remarkable for:- abnormal but nonspecific small to moderate volume of simple appearing free fluid in the pelvis. Paucity of intra-abdominal fat limits bowel detail, but no bowel obstruction or inflammation is evident.- small hypoenhancing areas in the superior spleen, with underlying borderline splenomegaly. Although nonspecific consider small splenic infarcts or hematogenous infection in this setting.- focal bone erosion of the posteroinferior right pubic ramus since last July.  But no other lytic osseous lesion identified, and no regional inflammation. This would be an unusual site for osteomyelitis. Query a metabolic (such as parathyroid) bone disorder. Metastatic disease also not excluded.  patient remained very combative and agitated despite giving multiple doses of Ativan and Haldol, he was started on Precedex and maxed out without much change, resulted in intubation and mechanical ventilation.  Extubated on 01/25/2022.  Patient was found to have Serratia bacteremia with aortic valve endocarditis, infectious disease was consulted and patient is currently on dual coverage with cefepime and ciprofloxacin. Repeat blood cultures on 01/26/2022 are negative in 24 hours.  Patient was also evaluated by [facility] and is not a candidate for surgical intervention.  Psych was also consulted for opioid withdrawal and he was started on methadone on 01/26/2022 at 40 mg daily-dose will be titrated by psych to keep him comfortable so he will not leave another AMA.  Palliative care was also consulted and he was made DNR with current level of care and treat the treatable.  TRH to resume care from 01/27/2022.  9/8: Hemodynamically stable.  Leukocytosis has been resolved.  Slowly decreasing hemoglobin, currently at 7.5, thrombocytopenia with slight improvement to 53.  HIT panel was negative. Anemia panel ordered. Borderline hypoglycemia with CBG in 70s, Semglee was changed to 5 units twice daily, switched Q 4 sliding scale with meal and nighttime coverage.  9/9: Patient was more interactive and alert with a slight decrease in methadone dose.  Repeat blood cultures negative so far. PT/OT recommending SNF Hemoglobin slowly decreasing, at 7.2 today.  Anemia panel with anemia of chronic disease with some iron deficiency.  9/10: Patient becoming more combative and refusing all the p.o. meds.  Received Haldol around 7 AM. Labs with some improvement in hemoglobin  to 7.7 and  platelets to 82.  9/11: Patient intermittently becoming agitated requiring Haldol.  9/12: Hemodynamically stable.  UDS was checked at daughter's request as she was blaming brother giving him some drugs, most likely some family dynamics.  UDS only positive for methadone which he is taking it here.  No other concerns. Need to continue taking IV cefepime and Cipro until 24-Mar-2022. Patient still very somnolent, decreasing the dose of Haldol and stopping Ativan.  Message was also sent to psych to evaluate for appropriate dosage of methadone.  Patient will need prolonged hospitalization to complete IV antibiotic course secondary to his history of IV drug use.  High risk for deterioration and death.   Assessment and Plan: * Septic shock (HCC) Patient initially admitted with severe sepsis and septic shock secondary to Serratia bacteremia and aortic valve endocarditis with his history of IV drug abuse. Multiple recent infections including pyelonephritis and osteomyelitis which were not treated adequately as he was keeps leaving AMA. Patient was intubated and required pressors in the beginning.  Extubated on 01/25/2022 and was weaned off from pressors. Blood pressure still on softer side. Repeat blood cultures on 9/7 remain negative in 24-hour. ID is on board. -Continue with cefepime and ciprofloxacin as recommended by ID for dual coverage -Patient will need long-term antibiotics and prolonged hospitalization.  Bacteremia Initial blood cultures positive for Serratia. -See above for management  Aortic valve endocarditis Found to have large aortic valve vegetation with history of Serratia bacteremia and IV drug use. Not a suitable candidate for any surgical intervention according to [facility] cardiothoracic surgery. -Continue with antibiotic  Altered mental status Patient with intermittent agitation and delirium requiring Haldol. Patient admitted with toxic metabolic encephalopathy secondary  to sepsis and illicit drug use.  No most likely with medications. -Decrease the dose of Haldol from 2 to 1 mg. -Avoid Ativan -Continue methadone -Will appreciate psych help -Continue to monitor  Osteomyelitis (HCC) Right ischial tuberosity osteomyelitis on MRI which is little unusual site for osteo.  No abscess or overlying ulcer.  Positive blood cultures, most likely blood seeding. -On appropriate antibiotics  Essential hypertension Patient was on amlodipine at home.  Admitted with septic shock requiring pressors. Blood pressure still on softer side-weaned off from pressors. -Keep holding home amlodipine -Current continue to monitor  Acute HFrEF (heart failure with reduced ejection fraction) (HCC) EF of 45 to 50% with global hypokinesis.  Decreased from his prior echocardiogram done in July 2023.  Most likely secondary to aortic endocarditis. Clinically appears euvolemic. 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 Psych was consulted for concern of opioid withdrawal and he was started on methadone to prevent another AMA. -Continue with methadone-psych will titrate as needed. -Continue to monitor for opioid withdrawal  Type 2 diabetes mellitus (HCC) Mild hypoglycemia with CBG in 70s. -Check A1c-elevated in July. -Make Semglee 5 units twice daily instead of 10 at night. -Switch SSI with moderate scale with meals. -We will add mealtime coverage if needed.  Normocytic anemia Worsening hemoglobin, at 7.2 today.  No obvious bleeding.  Most likely secondary to bone marrow suppression with sepsis. Anemia panel shows anemia of chronic disease with some iron deficiency. B12 levels pending. -Start him on p.o. iron supplement.  Thrombocytopenia (HCC) Patient developed sudden onset thrombocytopenia which nider at platelets of 43, now started improving slowly.  HIT panel was negative. DIC labs  were negative. Most likely secondary to bone  marrow suppression with sepsis.  No obvious bleeding. -Continue to monitor  Protein-calorie malnutrition, severe Estimated body mass index is 17.68 kg/m as calculated from the following:   Height as of 01/14/22: 5' 8.11" (1.73 m).   Weight as of this encounter: 52.9 kg.   -Dietitian consult     Subjective: Patient was again little somnolent, easily arousable.  Did not participated whole lot with communication.  Ate half of his breakfast per son.  Physical Exam: Vitals:   01/31/22 0434 01/31/22 0500 01/31/22 0820 01/31/22 1155  BP: (!) 122/54  109/86 (!) 96/56  Pulse: 64  74 74  Resp: 17  14 14   Temp: 97.9 F (36.6 C)  97.9 F (36.6 C) 98.3 F (36.8 C)  TempSrc: Oral  Oral Oral  SpO2: 99%  99% 100%  Weight:  48.5 kg     General.  Malnourished gentleman, in no acute distress. Pulmonary.  Lungs clear bilaterally, normal respiratory effort. CV.  Regular rate and rhythm, no JVD, rub or murmur. Abdomen.  Soft, nontender, nondistended, BS positive. CNS.  Somnolent Extremities.  No edema, no cyanosis, pulses intact and symmetrical. Psychiatry.  Appears to have some cognitive impairment  Data Reviewed: Prior data reviewed  Family Communication: Discussed with son at bedside.  Disposition: Status is: Inpatient Remains inpatient appropriate because: Require prolonged hospitalization for IV antibiotics with history of IV drug abuse   Planned Discharge Destination: Skilled nursing facility  DVT prophylaxis.  Lovenox Time spent: 45 minutes  This record has been created using . Errors have been sought and corrected,but may not always be located. Such creation errors do not reflect on the standard of care.  Author: Conservation officer, historic buildings, MD 01/31/2022 3:50 PM  For on call review [email or web address].

## 2022-01-31 NOTE — Consult Note (Signed)
[facility] Face-to-Face Psychiatry Consult   Reason for Consult: Follow-up with this 56 year old man with a history of opiate abuse disorder currently in the hospital for long-term medical treatment.  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 male patient admitted with "what?".  HPI: Patient seen and chart reviewed.  In the room patient had his eyes open and was sitting up in bed with a visitor trying to assist him to eat lunch.  Patient was making little effort on his own to eat.  Initially speaking to the patient it was hard to get his attention.  Had to speak several times to get his attention and even then he was not able to answer questions until the visit or rephrase them loudly for him.  Patient was disoriented to place initially.  Visitor reports that the patient had been agitated earlier today.  Notes indicate that patient still has alternating periods of agitation and sedation.  No sign that he is having any breathing problems.  Medical treatment continues as expected.  Past Psychiatric History: Patient has a longstanding history of opiate use disorder with previous treatment in outpatient methadone clinics.  Prior to hospitalization reportedly was using large amounts of IV fentanyl daily  Risk to Self:   Risk to Others:   Prior  Inpatient Therapy:   Prior Outpatient Therapy:    Past Medical History:  Past Medical History:  Diagnosis Date   Diabetes mellitus without complication (HCC)     Past Surgical History:  Procedure Laterality Date   I & D EXTREMITY Left 06/05/2018   Procedure: IRRIGATION AND DEBRIDEMENT EXTREMITY-LEFT HAND;  Surgeon: Lyndle Herrlich, MD;  Location: [facility] ORS;  Service: Orthopedics;  Laterality: Left;   Family History:  Family History  Problem Relation Age of Onset   Diabetes Mother    Family Psychiatric  History: None reported Social History:  Social History   Substance and Sexual Activity  Alcohol Use No     Social History   Substance and Sexual Activity  Drug Use Yes   Types: Cocaine, IV   Comment: Heroin    Social History   Socioeconomic History   Marital status: Married    Spouse name: Not on file   Number of children: Not on file   Years of education: Not on file   Highest education level: Not on file  Occupational History   Not on file  Tobacco Use   Smoking status: Every Day    Packs/day: 0.50    Types: Cigarettes   Smokeless tobacco: Never  Substance and Sexual Activity   Alcohol use: No   Drug use: Yes    Types: Cocaine, IV    Comment: Heroin   Sexual activity: Not on file    Comment: occasionally  Other Topics Concern   Not on file  Social History Narrative   Not on file  Social Determinants of Health   Financial Resource Strain: Not on file  Food Insecurity: Not on file  Transportation Needs: Not on file  Physical Activity: Not on file  Stress: Not on file  Social Connections: Not on file   Additional Social History:    Allergies:  No Active Allergies  Labs:  Results for orders placed or performed during the hospital encounter of 01/21/22 (from the past 48 hour(s))  Glucose, capillary     Status: None   Collection Time: 01/29/22  5:27 PM  Result Value Ref Range   Glucose-Capillary 97 70 - 99 mg/dL    Comment: Glucose reference  range applies only to samples taken after fasting for at least 8 hours.  Glucose, capillary     Status: None   Collection Time: 01/29/22  9:13 PM  Result Value Ref Range   Glucose-Capillary 86 70 - 99 mg/dL    Comment: Glucose reference range applies only to samples taken after fasting for at least 8 hours.  Glucose, capillary     Status: None   Collection Time: 01/30/22  4:18 AM  Result Value Ref Range   Glucose-Capillary 94 70 - 99 mg/dL    Comment: Glucose reference range applies only to samples taken after fasting for at least 8 hours.  Glucose, capillary     Status: None   Collection Time: 01/30/22  7:40 AM  Result Value Ref Range   Glucose-Capillary 85 70 - 99 mg/dL    Comment: Glucose reference range applies only to samples taken after fasting for at least 8 hours.   Comment 1 Notify RN   Glucose, capillary     Status: Abnormal   Collection Time: 01/30/22 11:30 AM  Result Value Ref Range   Glucose-Capillary 150 (H) 70 - 99 mg/dL    Comment: Glucose reference range applies only to samples taken after fasting for at least 8 hours.  Glucose, capillary     Status: Abnormal   Collection Time: 01/30/22  4:54 PM  Result Value Ref Range   Glucose-Capillary 122 (H) 70 - 99 mg/dL    Comment: Glucose reference range applies only to samples taken after fasting for at least 8 hours.  Glucose, capillary     Status: Abnormal   Collection Time: 01/30/22  8:26 PM  Result Value Ref Range   Glucose-Capillary 114 (H) 70 - 99 mg/dL    Comment: Glucose reference range applies only to samples taken after fasting for at least 8 hours.  Glucose, capillary     Status: Abnormal   Collection Time: 01/30/22 11:54 PM  Result Value Ref Range   Glucose-Capillary 119 (H) 70 - 99 mg/dL    Comment: Glucose reference range applies only to samples taken after fasting for at least 8 hours.   Comment 1 Notify RN   Urine Drug Screen, Qualitative ([facility] only)     Status: Abnormal   Collection Time: 01/31/22   5:10 AM  Result Value Ref Range   Tricyclic, Ur Screen NONE DETECTED NONE DETECTED   Amphetamines, Ur Screen NONE DETECTED NONE DETECTED   MDMA (Ecstasy)Ur Screen NONE DETECTED NONE DETECTED   Cocaine Metabolite,Ur [place] NONE DETECTED NONE DETECTED   Opiate, Ur Screen NONE DETECTED NONE DETECTED   Phencyclidine (PCP) Ur S NONE DETECTED NONE DETECTED   Cannabinoid 50 Ng, Ur [place] NONE DETECTED NONE DETECTED   Barbiturates, Ur Screen NONE DETECTED NONE DETECTED   Benzodiazepine, Ur Scrn NONE DETECTED NONE DETECTED   Methadone Scn, Ur POSITIVE (  A) NONE DETECTED    Comment: (NOTE) Tricyclics + metabolites, urine    Cutoff 1000 ng/mL Amphetamines + metabolites, urine  Cutoff 1000 ng/mL MDMA (Ecstasy), urine              Cutoff 500 ng/mL Cocaine Metabolite, urine          Cutoff 300 ng/mL Opiate + metabolites, urine        Cutoff 300 ng/mL Phencyclidine (PCP), urine         Cutoff 25 ng/mL Cannabinoid, urine                 Cutoff 50 ng/mL Barbiturates + metabolites, urine  Cutoff 200 ng/mL Benzodiazepine, urine              Cutoff 200 ng/mL Methadone, urine                   Cutoff 300 ng/mL  The urine drug screen provides only a preliminary, unconfirmed analytical test result and should not be used for non-medical purposes. Clinical consideration and professional judgment should be applied to any positive drug screen result due to possible interfering substances. A more specific alternate chemical method must be used in order to obtain a confirmed analytical result. Gas chromatography / mass spectrometry (GC/MS) is the preferred confirm atory method. Performed at [facility], [address] [place], [place] [address]   Glucose, capillary     Status: None   Collection Time: 01/31/22  8:23 AM  Result Value Ref Range   Glucose-Capillary 93 70 - 99 mg/dL    Comment: Glucose reference range applies only to samples taken after fasting for at least 8 hours.  Glucose, capillary      Status: Abnormal   Collection Time: 01/31/22 11:59 AM  Result Value Ref Range   Glucose-Capillary 194 (H) 70 - 99 mg/dL    Comment: Glucose reference range applies only to samples taken after fasting for at least 8 hours.    Current Facility-Administered Medications  Medication Dose Route Frequency Provider Last Rate Last Admin   0.9 %  sodium chloride infusion   Intravenous PRN Arnetha CourserAmin, Sumayya, MD 10 mL/hr at 01/31/22 0659 New Bag at 01/31/22 0659   acetaminophen (TYLENOL) tablet 975 mg  975 mg Oral Q6H Dgayli, Khabib, MD   975 mg at 01/31/22 0947   ceFEPIme (MAXIPIME) 2 g in sodium chloride 0.9 % 100 mL IVPB  2 g Intravenous Q8H Ravishankar, Rhodia AlbrightJayashree, MD 200 mL/hr at 01/31/22 0701 2 g at 01/31/22 0701   Chlorhexidine Gluconate Cloth 2 % PADS 6 each  6 each Topical Daily Salena SanerGonzalez, Carmen L, MD   6 each at 01/31/22 1005   ciprofloxacin (CIPRO) IVPB 400 mg  400 mg Intravenous Q12H Lynn Itoavishankar, Jayashree, MD 200 mL/hr at 01/31/22 1010 400 mg at 01/31/22 1010   docusate sodium (COLACE) capsule 100 mg  100 mg Oral BID PRN Rust-Chester, Cecelia ByarsBritton L, NP       enoxaparin (LOVENOX) injection 40 mg  40 mg Subcutaneous Q24H Beers, Brandon D, RPH   40 mg at 01/30/22 2125   feeding supplement (ENSURE ENLIVE / ENSURE PLUS) liquid 237 mL  237 mL Oral TID BM Raechel Chutegayli, Khabib, MD   237 mL at 01/31/22 1005   ferrous fumarate-b12-vitamic C-folic acid (TRINSICON / FOLTRIN) capsule 1 capsule  1 capsule Oral BID PC Arnetha CourserAmin, Sumayya, MD   1 capsule at 01/31/22 0952   haloperidol lactate (HALDOL) injection 1 mg  1 mg Intravenous Q6H PRN  Arnetha Courser, MD       insulin aspart (novoLOG) injection 0-15 Units  0-15 Units Subcutaneous TID WC Arnetha Courser, MD   2 Units at 01/30/22 1845   insulin aspart (novoLOG) injection 0-5 Units  0-5 Units Subcutaneous QHS Arnetha Courser, MD       ipratropium-albuterol (DUONEB) 0.5-2.5 (3) MG/3ML nebulizer solution 3 mL  3 mL Nebulization Q6H PRN Raechel Chute, MD       lidocaine (LIDODERM) 5  % 1 patch  1 patch Transdermal Q24H Martyn Malay, RPH   1 patch at 01/31/22 0953   [START ON 02/01/2022] methadone (DOLOPHINE) tablet 25 mg  25 mg Oral Daily Bristal Steffy, Jackquline Denmark, MD       multivitamin with minerals tablet 1 tablet  1 tablet Oral Daily Raechel Chute, MD   1 tablet at 01/31/22 0949   Oral care mouth rinse  15 mL Mouth Rinse PRN Arnetha Courser, MD       pantoprazole (PROTONIX) EC tablet 40 mg  40 mg Oral Daily Raechel Chute, MD   40 mg at 01/31/22 0949   traZODone (DESYREL) tablet 50 mg  50 mg Oral QHS PRN Jimmye Norman, NP   50 mg at 01/26/22 2158    Musculoskeletal: Strength & Muscle Tone: decreased Gait & Station: unable to stand Patient leans: N/A            Psychiatric Specialty Exam:  Presentation  General Appearance: No data recorded Eye Contact:No data recorded Speech:No data recorded Speech Volume:No data recorded Handedness:No data recorded  Mood and Affect  Mood:No data recorded Affect:No data recorded  Thought Process  Thought Processes:No data recorded Descriptions of Associations:No data recorded Orientation:No data recorded Thought Content:No data recorded History of Schizophrenia/Schizoaffective disorder:No data recorded Duration of Psychotic Symptoms:No data recorded Hallucinations:No data recorded Ideas of Reference:No data recorded Suicidal Thoughts:No data recorded Homicidal Thoughts:No data recorded  Sensorium  Memory:No data recorded Judgment:No data recorded Insight:No data recorded  Executive Functions  Concentration:No data recorded Attention Span:No data recorded Recall:No data recorded Fund of Knowledge:No data recorded Language:No data recorded  Psychomotor Activity  Psychomotor Activity:No data recorded  Assets  Assets:No data recorded  Sleep  Sleep:No data recorded  Physical Exam: Physical Exam Vitals and nursing note reviewed.  Constitutional:      Appearance: Normal appearance. He is  ill-appearing.  HENT:     Head: Normocephalic and atraumatic.     Mouth/Throat:     Pharynx: Oropharynx is clear.  Eyes:     Pupils: Pupils are equal, round, and reactive to light.  Cardiovascular:     Rate and Rhythm: Normal rate and regular rhythm.  Pulmonary:     Effort: Pulmonary effort is normal.     Breath sounds: Normal breath sounds.  Abdominal:     General: Abdomen is flat.     Palpations: Abdomen is soft.  Musculoskeletal:        General: Normal range of motion.  Skin:    General: Skin is warm and dry.  Neurological:     General: No focal deficit present.     Mental Status: He is alert. Mental status is at baseline.  Psychiatric:        Attention and Perception: He is inattentive.        Mood and Affect: Affect is blunt.        Speech: Speech is delayed.        Behavior: Behavior is slowed and withdrawn.  Cognition and Memory: Cognition is impaired. Memory is impaired.    Review of Systems  Constitutional: Negative.   HENT: Negative.    Eyes: Negative.   Respiratory: Negative.    Cardiovascular: Negative.   Gastrointestinal: Negative.   Musculoskeletal:  Positive for joint pain.  Skin: Negative.   Neurological: Negative.    Blood pressure (!) 96/56, pulse 74, temperature 98.3 F (36.8 C), temperature source Oral, resp. rate 14, weight 48.5 kg, SpO2 100 %. Body mass index is 16.21 kg/m.  Treatment Plan Summary: Medication management and Plan patient still has some intermittent episodes of agitation which probably are less related to withdrawal more to other factors such as pain or underlying illness and sleep disturbance.  Overall dosage of methadone seems to be adequately controlling withdrawal possibly leading to a little bit of extra sedation.  He is currently on 30 mg of methadone daily.  Benzodiazepines been discontinued.  Haloperidol being used as needed for delirious agitation.  We can try cutting down on the methadone to 25 mg a day.  If he  tolerates that without increased agitation fine otherwise we might go back to it.  Recommend continuing the rest of the treatment as written for now.  Patient may still be prone to having spells of delirium just from illness that we will simply need as needed medicines.  We can continue to follow as needed  Disposition:  See note above  Mordecai Rasmussen, MD 01/31/2022 12:45 PM

## 2022-01-31 NOTE — Assessment & Plan Note (Signed)
Patient with intermittent agitation and delirium requiring Haldol. Patient admitted with toxic metabolic encephalopathy secondary to sepsis and illicit drug use.  No most likely with medications. -Decrease the dose of Haldol from 2 to 1 mg. -Avoid Ativan -Continue methadone -Will appreciate psych help -Continue to monitor

## 2022-02-01 DIAGNOSIS — R4182 Altered mental status, unspecified: Secondary | ICD-10-CM

## 2022-02-01 LAB — CBC
HCT: 23.4 % — ABNORMAL LOW (ref 39.0–52.0)
Hemoglobin: 7.6 g/dL — ABNORMAL LOW (ref 13.0–17.0)
MCH: 27.7 pg (ref 26.0–34.0)
MCHC: 32.5 g/dL (ref 30.0–36.0)
MCV: 85.4 fL (ref 80.0–100.0)
Platelets: 104 K/uL — ABNORMAL LOW (ref 150–400)
RBC: 2.74 MIL/uL — ABNORMAL LOW (ref 4.22–5.81)
RDW: 14.6 % (ref 11.5–15.5)
WBC: 9.7 K/uL (ref 4.0–10.5)
nRBC: 0 % (ref 0.0–0.2)

## 2022-02-01 LAB — GLUCOSE, CAPILLARY
Glucose-Capillary: 103 mg/dL — ABNORMAL HIGH (ref 70–99)
Glucose-Capillary: 191 mg/dL — ABNORMAL HIGH (ref 70–99)
Glucose-Capillary: 142 mg/dL — ABNORMAL HIGH (ref 70–99)
Glucose-Capillary: 119 mg/dL — ABNORMAL HIGH (ref 70–99)

## 2022-02-01 MED ORDER — POLYETHYLENE GLYCOL 3350 17 G PO PACK
17.0000 g | PACK | Freq: Every day | ORAL | Status: DC
  Administered 2022-02-02 – 2022-02-06 (×2): 17 g via ORAL
  Filled 2022-02-01 (×5): qty 1

## 2022-02-01 NOTE — Progress Notes (Addendum)
Nutrition Follow-up  DOCUMENTATION CODES:   Severe malnutrition in context of social or environmental circumstances  INTERVENTION:   -Continue Ensure Enlive po TID, each supplement provides 350 kcal and 20 grams of protein -Continue MVI with minerals daily -Last BM on 01/26/22; discussed with MD regarding starting bowel regimen  NUTRITION DIAGNOSIS:   Severe Malnutrition related to social / environmental circumstances as evidenced by severe muscle depletion, severe fat depletion.  Ongoing  GOAL:   Patient will meet greater than or equal to 90% of their needs  Progressing  MONITOR:   PO intake, Supplement acceptance, Labs, Weight trends, Skin, I & O's  REASON FOR ASSESSMENT:   Malnutrition Screening Tool, Ventilator    ASSESSMENT:   56 y/o male with h/o DM, substance abuse and HTN who is admitted with severe sepsis with septic shock secondary to osteomyelitis in the R hip and aortic valve endocarditis. MRI brain consistent with evolving subacute ischemic infarcts.  9/7- methadone ordered by psych 9/10- safety sitter intiated  Reviewed I/O's: +780 ml x 24 hours and +7.3 L since admission   Pt unavailable at time of visit. Attempted to speak with pt via call to hospital room phone, however, unable to reach.   Pt with variable oral intake. Noted meal completions 25-100%. He is drinking Ensure supplements.   Noted no BM since 01/26/22; messaged MD, who agrees with initiation bowel regimen.   Medications reviewed and include ferrous fumarate, B-12, vitamin C, folic acid, and cipro.   Labs reviewed: CBGS: 74-191 (inpatient orders for glycemic control are 0-15 units insulin aspart TID with meals, and 0-5 units insulin aspart daily at bedtime).    Diet Order:   Diet Order             Diet regular Room service appropriate? Yes; Fluid consistency: Thin  Diet effective now                   EDUCATION NEEDS:   No education needs have been identified at this  time  Skin:  Skin Assessment: Skin Integrity Issues: Skin Integrity Issues:: DTI DTI: lt buttocks  Last BM:  01/26/22  Height:   Ht Readings from Last 1 Encounters:  01/14/22 5' 8.11" (1.73 m)    Weight:   Wt Readings from Last 1 Encounters:  01/31/22 48.5 kg    Ideal Body Weight:  72.7 kg  BMI:  Body mass index is 16.21 kg/m.  Estimated Nutritional Needs:   Kcal:  1750-1950  Protein:  100-115 grams  Fluid:  > 1.7 L    Levada Schilling, RD, LDN, CDCES Registered Dietitian II Certified Diabetes Care and Education Specialist Please refer to [facility] for RD and/or RD on-call/weekend/after hours pager

## 2022-02-01 NOTE — Progress Notes (Signed)
PROGRESS NOTE  JAYDEE CONRAN  HFW:[id number] DOB: July 08, 1965 DOA: 01/21/2022 PCP: Pcp, No   Brief Narrative:  Patient is a 56 year old male with history of IV drug use, recurrent hospitalization and leaving AMA who presented here on 01/21/2022 with complaint of not feeling well.  He was admitted multiple times in the past for sepsis including pyelonephritis, bacteremia, osteomyelitis of the right hip.  There had been multiple attempts to treat with IV antibiotics, perform TEE to rule out endocarditis but he continued to leave AMA.  On presentation, he was febrile with leukocytosis, tachycardic, tachypneic, lactacidosis, elevated procalcitonin.  UDS positive for cocaine, opiates.  Chest x-ray did not show any pneumonia. He became combative, febrile, tachycardic.  Patient was given Ativan, Haldol, restrained.  Precedex was started but even at maximum dose, he was still agitated so had to be intubated.  Admitted under ICU.   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 He  was managed for severe sepsis, septic shock. Also transferred to [facility] for TEE and cardiothoracic surgery evaluation but he had been concluded not to be a candidate for surgery.  Patient was finally extubated on 9/6.  Currently he is on broad-spectrum antibiotics with cefepime, ciprofloxacin, repeat blood cultures have been negative.  Psych was also consulted for opioid withdrawal and will started on methadone.  Palliative care was also following for goals of care discussion, currently the CODE STATUS is DNR.  Patient transferred to [facility] service on 9/8.  Currently he is hemodynamically stable.  PT/OT recommending skilled nursing facility on discharge.  TOC following.  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  Left AMA so not treated. Required intubation, required pressures for hypotension on admission. Blood cultures showed Serratia.  ID following, on cefepime and ciprofloxacin, plan to be continued for 6 weeks, till 03/11/2022.Marland Kitchen  Repeat blood cultures have been negative.  Patient needs long-term antibiotics and prolonged hospitalization due to history of IV drug use. Currently hemodynamically stable.  Aortic valve endocarditis: Secondary to bacteremia.  Seen by cardiothoracic surgery, not a candidate for surgical intervention.  Continue current antibiotics  Agitation/altered mental status: Hospital course remarkable for severe agitation requiring Precedex, intubation.  Altered mental status suspected to be from toxic metabolic encephalopathy from sepsis, drugs, withdrawal.  Psychiatry is following.  Currently on methadone.  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  No abscess or underlying ulcer.  Continue current antibiotics  Systolic congestive heart failure: Currently euvolemic.  Echo showed EF of 40 to 45%, global hypokinesis.  Most likely secondary to aortic endocarditis.  Continue current medications  Hypertension: On amlodipine at home.  Initially required pressors for septic shock.  Currently amlodipine on hold.  Monitor blood pressure, still soft  Polysubstance abuse: History of IV drug use.  Psychiatry following.  Diabetes type 2: Monitor blood sugars, continue current insulin regimen.A1c of 9.4  Iron deficiency anemia/ thrombocytopenia: No evidence of acute bleeding.  Likely multifactorial  including sepsis, nutritional deficiency.  Also found to  have low iron.  Started on oral iron supplementation.  Normal vitamin B12.  Hemoglobin stable in the range of 7. Patient also developed thrombocytopenia during this hospitalization, now improving.  HIT panel negative.  Severe protein calorie malnutrition: Dietitian following        Nutrition Problem: Severe Malnutrition Etiology: social / environmental circumstances Pressure Injury 01/25/22 Buttocks Left Deep Tissue Pressure Injury - Purple or maroon localized area of discolored intact skin or blood-filled blister due to damage of underlying soft tissue from pressure and/or shear. (Active)  01/25/22 0830  Location: Buttocks  Location Orientation: Left  Staging: Deep Tissue Pressure Injury - Purple or maroon localized area of discolored intact skin or blood-filled blister due to damage of underlying soft tissue from pressure and/or shear.  Wound Description (Comments):   Present on Admission: No  Dressing Type Foam - Lift dressing to assess site every shift 02/01/22 1120    DVT prophylaxis:enoxaparin (LOVENOX) injection 40 mg Start: 01/26/22 2200 SCDs Start: 01/22/22 0456     Code Status: DNR  Family Communication: None at bedside  Patient status:Inpatient  Patient is from :Home  Anticipated discharge to:Not sure at the moment.  PT recommending SNF  Estimated DC date: Not sure   Consultants: ID, critical care, cardiothoracic surgery  Procedures: Intubation  Antimicrobials:  Anti-infectives (From admission, onward)    Start     Dose/Rate Route Frequency Ordered Stop   01/25/22 0600  ceFEPIme (MAXIPIME) 2 g in sodium chloride 0.9 % 100 mL IVPB        2 g 200 mL/hr over 30 Minutes Intravenous Every 8 hours 01/24/22 1523     01/24/22 1800  ciprofloxacin (CIPRO) IVPB 400 mg        400 mg 200 mL/hr over 60 Minutes Intravenous Every 12 hours 01/24/22 1523     01/23/22 0600  vancomycin (VANCOREADY) IVPB 1250 mg/250  mL  Status:  Discontinued        1,250 mg 166.7 mL/hr over 90 Minutes Intravenous Every 24 hours 01/22/22 1921 01/23/22 1127   01/22/22 1415  meropenem (MERREM) 1 g in sodium chloride 0.9 % 100 mL IVPB        1 g 200 mL/hr over 30 Minutes Intravenous Every 8 hours 01/22/22 1318 01/24/22 2301   01/22/22 1330  ceFEPIme (MAXIPIME) 2 g in sodium chloride 0.9 % 100 mL IVPB  Status:  Discontinued        2 g 200 mL/hr over 30 Minutes Intravenous Every 8 hours 01/22/22 0543 01/22/22 1301   01/22/22 0600  vancomycin (VANCOREADY) IVPB 1250 mg/250 mL  Status:  Discontinued        1,250 mg 166.7 mL/hr over 90 Minutes Intravenous Every 24 hours 01/22/22 0547 01/22/22 1333   01/21/22 2230  vancomycin (VANCOREADY) IVPB 1250 mg/250 mL  Status:  Discontinued        1,250 mg 166.7 mL/hr over 90 Minutes Intravenous  Once 01/21/22 2226 01/22/22 0547   01/21/22 2215  ceFEPIme (MAXIPIME) 2 g in sodium chloride 0.9 % 100 mL IVPB        2 g 200 mL/hr over 30 Minutes Intravenous  Once 01/21/22 2211 01/22/22 0641   01/21/22 2215  metroNIDAZOLE (FLAGYL) IVPB 500 mg        500 mg 100 mL/hr over 60 Minutes Intravenous  Once 01/21/22 2211 01/22/22 0921   01/21/22 2215  vancomycin (VANCOCIN) IVPB 1000 mg/200 mL premix  Status:  Discontinued        1,000 mg 200  mL/hr over 60 Minutes Intravenous  Once 01/21/22 2211 01/21/22 2225       Subjective: Patient seen and examined at the bedside today.  He was lying in bed, comfortable without any distress.  Denies any complaints.  Alert and awake, not oriented.  Agitated during my evaluation.But shouted when I touched him  Objective: Vitals:   01/31/22 1155 01/31/22 1557 01/31/22 1800 02/01/22 0821  BP: (!) 96/56 100/83  (!) 95/42  Pulse: 74 83  65  Resp: 14 14 17 16   Temp: 98.3 F (36.8 C) 97.6 F (36.4 C)  97.6 F (36.4 C)  TempSrc: Oral   Oral  SpO2: 100% 98%  100%  Weight:        Intake/Output Summary (Last 24 hours) at 02/01/2022 1251 Last data filed at  01/31/2022 1903 Gross per 24 hour  Intake 460.41 ml  Output --  Net 460.41 ml   Filed Weights   01/27/22 0500 01/30/22 0500 01/31/22 0500  Weight: 52.9 kg 41.1 kg 48.5 kg    Examination:  General exam: Overall comfortable, not in distress, very deconditioned, looks malnourished HEENT: PERRL Respiratory system:  no wheezes or crackles  Cardiovascular system: S1 & S2 heard, RRR.  Gastrointestinal system: Abdomen is nondistended, soft and nontender. Central nervous system: Alert and awake , could not figure out orientation. Extremities: No edema, no clubbing ,no cyanosis Skin: No rashes, no ulcers,no icterus     Data Reviewed: I have personally reviewed following labs and imaging studies  CBC: Recent Labs  Lab 01/26/22 0809 01/27/22 0417 01/28/22 0625 01/29/22 0550 02/01/22 0724  WBC 7.2 6.5 5.9 8.6 9.7  NEUTROABS 5.5  --   --   --   --   HGB 7.8* 7.5* 7.2* 7.7* 7.6*  HCT 23.7* 23.9* 22.8* 24.2* 23.4*  MCV 85.6 90.2 89.8 89.0 85.4  PLT 45* 53* 62* 82* 104*   Basic Metabolic Panel: Recent Labs  Lab 01/26/22 0809 01/27/22 0417 01/28/22 0625 01/29/22 0550  NA 138 139 136 137  K 3.2* 4.2 3.9 3.8  CL 105 110 105 106  CO2 26 26 25 27   GLUCOSE 147* 64* 100* 118*  BUN 18 18 15 15   CREATININE 0.63 0.64 0.70 0.74  CALCIUM 7.9* 7.8* 7.8* 8.0*  MG 1.8 1.8 1.9 1.9  PHOS 2.6 2.9 3.2 3.1     Recent Results (from the past 240 hour(s))  MRSA Next Gen by PCR, Nasal     Status: None   Collection Time: 01/22/22  8:44 PM   Specimen: Nasal Mucosa; Nasal Swab  Result Value Ref Range Status   MRSA by PCR Next Gen NOT DETECTED NOT DETECTED Final    Comment: (NOTE) The GeneXpert MRSA Assay (FDA approved for NASAL specimens only), is one component of a comprehensive MRSA colonization surveillance program. It is not intended to diagnose MRSA infection nor to guide or monitor treatment for MRSA infections. Test performance is not FDA approved in patients less than 70  years old. Performed at [facility], [address] [place], [facility] [address]   Culture, blood (Routine X 2) w Reflex to ID Panel     Status: None   Collection Time: 01/26/22  8:09 AM   Specimen: BLOOD  Result Value Ref Range Status   Specimen Description BLOOD LEFT ARM  Final   Special Requests   Final    AEROBIC BOTTLE ONLY Blood Culture results may not be optimal due to an excessive volume of blood received in culture  bottles   Culture   Final    NO GROWTH 5 DAYS Performed at [facility], [address] [place], [place] [address]    Report Status 01/31/2022 FINAL  Final  Culture, blood (Routine X 2) w Reflex to ID Panel     Status: None   Collection Time: 01/26/22 10:14 AM   Specimen: BLOOD RIGHT HAND  Result Value Ref Range Status   Specimen Description BLOOD RIGHT HAND  Final   Special Requests   Final    BOTTLES DRAWN AEROBIC AND ANAEROBIC Blood Culture adequate volume   Culture   Final    NO GROWTH 5 DAYS Performed at [facility], [address], [place], [place] [address]    Report Status 01/31/2022 FINAL  Final     Radiology Studies: No results found.  Scheduled Meds:  acetaminophen  975 mg Oral Q6H   Chlorhexidine Gluconate Cloth  6 each Topical Daily   enoxaparin (LOVENOX) injection  40 mg Subcutaneous Q24H   feeding supplement  237 mL Oral TID BM   ferrous fumarate-b12-vitamic C-folic acid  1 capsule Oral BID PC   insulin aspart  0-15 Units Subcutaneous TID WC   insulin aspart  0-5 Units Subcutaneous QHS   lidocaine  1 patch Transdermal Q24H   methadone  25 mg Oral Daily   multivitamin with minerals  1 tablet Oral Daily   pantoprazole  40 mg Oral Daily   Continuous Infusions:  sodium chloride 10 mL/hr at 01/31/22 0659   ceFEPime (MAXIPIME) IV 2 g (02/01/22 0618)   ciprofloxacin 400 mg (02/01/22 0933)     LOS: 10 days   Burnadette Pop, MD [facility] P9/13/2023, 12:51 PM

## 2022-02-01 NOTE — Progress Notes (Signed)
PT Cancellation Note  Patient Details Name: Earl Moreno MRN: [id number] DOB: Jan 22, 1966   Cancelled Treatment:    Reason Eval/Treat Not Completed: Patient's level of consciousness;Fatigue/lethargy limiting ability to participate (Pt asleep earlier, now awake upon entry. Maintains a flat forward gaze despite author's attempts to engage. Son's attempts to engage also futile. Son reports pt more lucid in morning.)   2:57 PM, 02/01/22 Rosamaria Lints, PT, DPT Physical Therapist - [facility]  [phone] (ASCOM)    Jowana Thumma C 02/01/2022, 2:53 PM

## 2022-02-01 NOTE — TOC Progression Note (Signed)
Transition of Care [facility]) - Progression Note    Patient Details  Name: Earl Moreno MRN: [id number] Date of Birth: [birth year]  Transition of Care [facility]) CM/SW Contact  Gildardo Griffes, [place] Phone Number: 02/01/2022, 3:04 PM  Clinical Narrative:     CSW notes patient unable to participate with PT today  due decreased consciousness, unable to arouse or engage with PT per notes. Per nutrition Severe Malnutrition related to social / environmental circumstances as evidenced by severe muscle depletion, severe fat depletion.  Patient has hx of homeless, polysubstance abuse, aggitation, and no insurance. Per OT recent attempt to work with patient however unable to follow commands or make needs known.   Per psych history Patient has a longstanding history of opiate use disorder with previous treatment in outpatient methadone clinics.  Prior to hospitalization reportedly was using large amounts of IV fentanyl daily. currently on 30 mg of methadone daily.  Haloperidol being used as needed for delirious agitation.  CSW discussed case with MD, potential for palliative to be consulted again as last conversation with palliative was about a week ago. At this time patient does not appear to qualify for SNF level of care due to inability to be skilled or participate in therapies, secondly patient has no payor source or insurance.   Pending GOC discussion with palliative to assist in discharge planning, as this time SNF is not appropriate dc plan.   Expected Discharge Plan: Skilled Nursing Facility Barriers to Discharge: Continued Medical Work up  Expected Discharge Plan and Services Expected Discharge Plan: Skilled Nursing Facility   Discharge Planning Services: CM Consult   Living arrangements for the past 2 months: Apartment, Homeless                 DME Arranged: N/A DME Agency: NA                   Social Determinants of Health (SDOH) Interventions    Readmission Risk Interventions     01/23/2022   10:21 AM  Readmission Risk Prevention Plan  Transportation Screening Complete  Medication Review (RN Care Manager) Complete  PCP or Specialist appointment within 3-5 days of discharge Complete  HRI or Home Care Consult Complete  SW Recovery Care/Counseling Consult Complete  Palliative Care Screening Not Applicable  Skilled Nursing Facility Not Applicable

## 2022-02-01 NOTE — Progress Notes (Signed)
   Date of Admission:  01/21/2022     ID: Earl Moreno is a 56 y.o. 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)   Essential hypertension   Protein-calorie malnutrition, severe   Altered mental status   Bacteremia   Aortic valve endocarditis   Acute HFrEF (heart failure with reduced ejection fraction) (HCC)   Osteomyelitis (HCC)   Normocytic anemia   Thrombocytopenia (HCC)    Subjective: Awake but non verbal   Medications:   acetaminophen  975 mg Oral Q6H   Chlorhexidine Gluconate Cloth  6 each Topical Daily   enoxaparin (LOVENOX) injection  40 mg Subcutaneous Q24H   feeding supplement  237 mL Oral TID BM   ferrous fumarate-b12-vitamic C-folic acid  1 capsule Oral BID PC   insulin aspart  0-15 Units Subcutaneous TID WC   insulin aspart  0-5 Units Subcutaneous QHS   lidocaine  1 patch Transdermal Q24H   methadone  25 mg Oral Daily   multivitamin with minerals  1 tablet Oral Daily   pantoprazole  40 mg Oral Daily    Objective: Vital signs in last 24 hours: Temp:  [97.6 F (36.4 C)-98.3 F (36.8 C)] 97.6 F (36.4 C) (09/13 0821) Pulse Rate:  [65-83] 65 (09/13 0821) Resp:  [14-17] 16 (09/13 0821) BP: (95-100)/(42-83) 95/42 (09/13 0821) SpO2:  [98 %-100 %] 100 % (09/13 0821)    PHYSICAL EXAM:  General: awake, but letharic and non verbal No distress Lungs: b/l air entry Heart: s1s2 Abdomen: Soft, non-tender,not distended. Bowel sounds normal. No masses Neurologic: moves all extremities  Lab Results Recent Labs    02/01/22 0724  WBC 9.7  HGB 7.6*  HCT 23.4*     Microbiology: 01/22/22 - serratia 01/26/22 BC neg    Assessment/Plan: Encephalopathy secondary to sepsis and infarct - improved But now it is more influenced by the meds ?   Serratia bacteremia with aortic valve endocarditis On dual coverage with cefepime +  cipro Repeat blood culture neg Will need for 6 weeks-- 03-13-22  IVDA  heroin user - now on methadone  25mg    -No resp distress    Osteomyelitis rt pubic ramus Previous bacteremias including serratia , enterobacter- not treated because of him leaving AMA ? _Anemia   Thrombocytopenia  Discussed the management with care team

## 2022-02-02 LAB — GLUCOSE, CAPILLARY
Glucose-Capillary: 110 mg/dL — ABNORMAL HIGH (ref 70–99)
Glucose-Capillary: 192 mg/dL — ABNORMAL HIGH (ref 70–99)
Glucose-Capillary: 93 mg/dL (ref 70–99)
Glucose-Capillary: 103 mg/dL — ABNORMAL HIGH (ref 70–99)

## 2022-02-02 MED ORDER — FOLIC ACID 1 MG PO TABS
1.0000 mg | ORAL_TABLET | Freq: Every day | ORAL | Status: DC
  Administered 2022-02-03 – 2022-02-08 (×4): 1 mg via ORAL
  Filled 2022-02-02 (×5): qty 1

## 2022-02-02 MED ORDER — FERROUS FUMARATE 324 (106 FE) MG PO TABS
1.0000 | ORAL_TABLET | Freq: Two times a day (BID) | ORAL | Status: DC
  Administered 2022-02-02 – 2022-02-22 (×32): 106 mg via ORAL
  Filled 2022-02-02 (×43): qty 1

## 2022-02-02 NOTE — Progress Notes (Signed)
Occupational Therapy Treatment Patient Details Name: Earl Moreno MRN: [id number] DOB: 1965/09/09 Today's Date: 02/02/2022   History of present illness Pt is a 56 y.o. male  with past medical history of multiple admissions for sepsis including pyelonephritis, GN bacteremia and suspected osteomyelitis in his right hip as recently as 01/15/2022 with multiple attempts to treat with IV antibiotics but patient continues to leave AMA, history of heroin abuse, DM 2, splenic infarct, admitted on 01/21/2022 with septic shock initially requiring intubation/ventilation.   OT comments  Pt seen for skilled co-treatment with PT for pt and therapist safety. Pt remains very lethargic throughout and agitated at times. He moans with mobility bit is unable to verbal pain location during session. Pt does express need to urinate and stands with +2 assistance and therapist placing urinal in which he was able to successfully utilize. Wash cloth placed on pt's face and he does not make any attempts to remove or wash face with prompting. Pt is not actively participating this session and does not appear to be making any progress towards therapy goals. OT will continue attempts but if not progressing he may need long term placement and not skilled intervention.    Recommendations for follow up therapy are one component of a multi-disciplinary discharge planning process, led by the attending physician.  Recommendations may be updated based on patient status, additional functional criteria and insurance authorization.    Follow Up Recommendations  Skilled nursing-short term rehab (<3 hours/day)    Assistance Recommended at Discharge Frequent or constant Supervision/Assistance  Patient can return home with the following  Assistance with cooking/housework;Direct supervision/assist for medications management;Direct supervision/assist for financial management;Assist for transportation;Two people to help with walking and/or  transfers;Two people to help with bathing/dressing/bathroom   Equipment Recommendations  Other (comment)       Precautions / Restrictions Precautions Precautions: Fall Restrictions Weight Bearing Restrictions: No       Mobility Bed Mobility Overal bed mobility: Needs Assistance Bed Mobility: Supine to Sit, Sit to Supine     Supine to sit: Mod assist, +2 for physical assistance Sit to supine: Max assist, +2 for physical assistance   General bed mobility comments: + 2 person assistance required. patient does not initiate tasks and required multimodal cues for follow through of task when therapist initiated the movement. increased assistance required for return to bed and patient resistive at times to movement    Transfers Overall transfer level: Needs assistance Equipment used: Rolling walker (2 wheels), 2 person hand held assist Transfers: Sit to/from Stand Sit to Stand: Mod assist, +2 physical assistance           General transfer comment: maximal multimodal cues required. + 2 person assistance required     Balance Overall balance assessment: Needs assistance Sitting-balance support: Feet supported Sitting balance-Leahy Scale: Fair     Standing balance support: Bilateral upper extremity supported Standing balance-Leahy Scale: Poor Standing balance comment: external support required                           ADL either performed or assessed with clinical judgement   ADL                                         General ADL Comments: total A for all self care tasks. Pt unable to hold urinal during session.  Extremity/Trunk Assessment Upper Extremity Assessment Upper Extremity Assessment: Generalized weakness             Cognition Arousal/Alertness: Lethargic Behavior During Therapy: Agitated, Flat affect Overall Cognitive Status: Impaired/Different from baseline Area of Impairment: Memory, Following commands,  Safety/judgement, Awareness, Problem solving                 Orientation Level: Disoriented to, Person, Place, Time, Situation Current Attention Level: Focused Memory: Decreased recall of precautions, Decreased short-term memory Following Commands:  (unable to follow single step commands) Safety/Judgement: Decreased awareness of safety, Decreased awareness of deficits Awareness: Intellectual Problem Solving: Slow processing, Decreased initiation, Difficulty sequencing, Requires verbal cues, Requires tactile cues General Comments: patient generally is unable to follow commands. patient participated with mobility as an automatic response to therapist initiating tasks                   Pertinent Vitals/ Pain       Pain Assessment Pain Assessment: Faces Faces Pain Scale: Hurts little more Pain Location: unable to state, moans with mobility Pain Descriptors / Indicators: Moaning Pain Intervention(s): Limited activity within patient's tolerance, Monitored during session, Repositioned         Frequency  Min 2X/week        Progress Toward Goals  OT Goals(current goals can now be found in the care plan section)  Progress towards OT goals: Progressing toward goals  Acute Rehab OT Goals OT Goal Formulation: Patient unable to participate in goal setting  Plan Discharge plan remains appropriate;Frequency remains appropriate    Co-evaluation    PT/OT/SLP Co-Evaluation/Treatment: Yes Reason for Co-Treatment: Complexity of the patient's impairments (multi-system involvement);Necessary to address cognition/behavior during functional activity;For patient/therapist safety;To address functional/ADL transfers PT goals addressed during session: Mobility/safety with mobility OT goals addressed during session: ADL's and self-care      AM-PAC OT "6 Clicks" Daily Activity     Outcome Measure   Help from another person eating meals?: A Little Help from another person taking care  of personal grooming?: A Lot Help from another person toileting, which includes using toliet, bedpan, or urinal?: A Lot Help from another person bathing (including washing, rinsing, drying)?: A Lot Help from another person to put on and taking off regular upper body clothing?: A Lot Help from another person to put on and taking off regular lower body clothing?: A Lot 6 Click Score: 13    End of Session Equipment Utilized During Treatment: Rolling walker (2 wheels)  OT Visit Diagnosis: Unsteadiness on feet (R26.81);Muscle weakness (generalized) (M62.81)   Activity Tolerance Patient limited by lethargy   Patient Left in bed;with call bell/phone within reach;with bed alarm set;with family/visitor present   Nurse Communication Mobility status        Time: 1350-1415 OT Time Calculation (min): 25 min  Charges: OT General Charges $OT Visit: 1 Visit OT Treatments $Self Care/Home Management : 8-22 mins  Jackquline Denmark, MS, OTR/L , CBIS ascom [phone]  02/02/22, 4:03 PM

## 2022-02-02 NOTE — Progress Notes (Signed)
PROGRESS NOTE  Earl Moreno  ZOX:[id number] DOB: [birth year] DOA: 01/21/2022 PCP: Pcp, No   Brief Narrative:  Patient is a 56 year old male with history of IV drug use, recurrent hospitalization and leaving AMA who presented here on 01/21/2022 with complaint of not feeling well.  He was admitted multiple times in the past for sepsis including pyelonephritis, bacteremia, osteomyelitis of the right hip.  There had been multiple attempts to treat with IV antibiotics, perform TEE to rule out endocarditis but he continued to leave AMA.  On presentation, he was febrile with leukocytosis, tachycardic, tachypneic, lab work showed elevated lactic acid,pro-calcitonin.  UDS positive for cocaine, opiates.  Chest x-ray did not show any pneumonia. He became combative, febrile, tachycardic.  Patient was given Ativan, Haldol, restrained.  Precedex was started but even at maximum dose, he was still agitated so had to be intubated.  Admitted under ICU.   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 He  was started management  for severe sepsis, septic shock. Also transferred to [facility] for TEE and cardiothoracic surgery evaluation but he had been concluded not to be a candidate for surgery.  Patient was finally extubated on 9/6.  Currently he is on broad-spectrum antibiotics with cefepime, ciprofloxacin, repeat blood cultures have been negative.  Psych was also consulted for opioid withdrawal and started on methadone.  Palliative care was also following for goals of care discussion, currently the CODE STATUS is DNR.  Patient transferred to [facility] service on 9/8.  Currently he is hemodynamically stable.  PT/OT recommending skilled nursing facility on discharge.  TOC following.  Looks like this  will be a prolonged hospitalization.  Assessment & Plan:  Principal Problem:   Septic shock (HCC) Active Problems:   Bacteremia   Aortic valve endocarditis   Altered mental status    Osteomyelitis (HCC)   Essential hypertension   Acute HFrEF (heart failure with reduced ejection fraction) (HCC)   Polysubstance (including opioids) dependence with physiol dependence (HCC)   Type 2 diabetes mellitus (HCC)   Normocytic anemia   Thrombocytopenia (HCC)   Protein-calorie malnutrition, severe  Severe sepsis/septic shock/Saturday bacteremia: History of multiple admissions with infections including pyelonephritis, osteomyelitis.  Left AMA so not treated. Required intubation, required pressures for hypotension on admission. Blood cultures showed Serratia.  ID following, on cefepime and ciprofloxacin, plan to be continued for 6 weeks, till 2022-03-19.Marland Kitchen  Repeat blood cultures have been negative.  Patient needs long-term antibiotics and prolonged hospitalization due to history of IV drug use. Currently hemodynamically stable.  Aortic valve endocarditis: Secondary to bacteremia.  Seen by cardiothoracic surgery, not a candidate for surgical intervention.  Continue current antibiotics  Agitation/altered mental status: Hospital course remarkable for severe agitation requiring Precedex, intubation.  Altered mental status suspected to be from toxic metabolic encephalopathy from sepsis, drugs, withdrawal.  Psychiatry is following.  Currently on methadone.  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  No abscess or underlying ulcer.  Continue current antibiotics  Systolic congestive heart failure: Currently euvolemic.  Echo showed EF of 40 to 45%, global hypokinesis.  Most likely secondary to aortic endocarditis.  Continue current medications  Hypertension: On amlodipine at home.  Initially required pressors for septic shock.  Currently amlodipine on hold.  Monitor blood pressure,stable to soft  Polysubstance abuse: History of IV drug use.  Psychiatry following.  Diabetes type 2: Monitor blood sugars, continue current insulin regimen.A1c of  9.4  Iron deficiency anemia/  thrombocytopenia: No evidence of acute bleeding.  Likely multifactorial including sepsis, nutritional deficiency.  Also found to have low iron.  Started on oral iron supplementation.  Normal vitamin B12.  Hemoglobin has remained stable in the range of 7. Patient also developed thrombocytopenia during this hospitalization, now improving.  HIT panel negative.  Severe protein calorie malnutrition: Dietitian following        Nutrition Problem: Severe Malnutrition Etiology: social / environmental circumstances Pressure Injury 01/25/22 Buttocks Left Deep Tissue Pressure Injury - Purple or maroon localized area of discolored intact skin or blood-filled blister due to damage of underlying soft tissue from pressure and/or shear. (Active)  01/25/22 0830  Location: Buttocks  Location Orientation: Left  Staging: Deep Tissue Pressure Injury - Purple or maroon localized area of discolored intact skin or blood-filled blister due to damage of underlying soft tissue from pressure and/or shear.  Wound Description (Comments):   Present on Admission: No  Dressing Type Foam - Lift dressing to assess site every shift 02/02/22 0808    DVT prophylaxis:enoxaparin (LOVENOX) injection 40 mg Start: 01/26/22 2200 SCDs Start: 01/22/22 0456     Code Status: DNR  Family Communication: None at bedside  Patient status:Inpatient  Patient is from :Home  Anticipated discharge to:Not sure at the moment.  PT recommending SNF on DC  Estimated DC date: Not sure   Consultants: ID, critical care, cardiothoracic surgery  Procedures: Intubation  Antimicrobials:  Anti-infectives (From admission, onward)    Start     Dose/Rate Route Frequency Ordered Stop   01/25/22 0600  ceFEPIme (MAXIPIME) 2 g in sodium chloride 0.9 % 100 mL IVPB        2 g 200 mL/hr over 30 Minutes Intravenous Every 8 hours 01/24/22 1523     01/24/22 1800  ciprofloxacin (CIPRO) IVPB 400 mg        400 mg 200  mL/hr over 60 Minutes Intravenous Every 12 hours 01/24/22 1523     01/23/22 0600  vancomycin (VANCOREADY) IVPB 1250 mg/250 mL  Status:  Discontinued        1,250 mg 166.7 mL/hr over 90 Minutes Intravenous Every 24 hours 01/22/22 1921 01/23/22 1127   01/22/22 1415  meropenem (MERREM) 1 g in sodium chloride 0.9 % 100 mL IVPB        1 g 200 mL/hr over 30 Minutes Intravenous Every 8 hours 01/22/22 1318 01/24/22 2301   01/22/22 1330  ceFEPIme (MAXIPIME) 2 g in sodium chloride 0.9 % 100 mL IVPB  Status:  Discontinued        2 g 200 mL/hr over 30 Minutes Intravenous Every 8 hours 01/22/22 0543 01/22/22 1301   01/22/22 0600  vancomycin (VANCOREADY) IVPB 1250 mg/250 mL  Status:  Discontinued        1,250 mg 166.7 mL/hr over 90 Minutes Intravenous Every 24 hours 01/22/22 0547 01/22/22 1333   01/21/22 2230  vancomycin (VANCOREADY) IVPB 1250 mg/250 mL  Status:  Discontinued        1,250 mg 166.7 mL/hr over 90 Minutes Intravenous  Once 01/21/22 2226 01/22/22 0547   01/21/22 2215  ceFEPIme (MAXIPIME) 2 g in sodium chloride 0.9 % 100 mL IVPB        2 g 200 mL/hr over 30 Minutes Intravenous  Once 01/21/22 2211 01/22/22 0641   01/21/22 2215  metroNIDAZOLE (FLAGYL) IVPB 500 mg        500 mg 100 mL/hr over 60 Minutes Intravenous  Once 01/21/22 2211 01/22/22 0921   01/21/22 2215  vancomycin (VANCOCIN) IVPB 1000 mg/200 mL premix  Status:  Discontinued        1,000 mg 200 mL/hr over 60 Minutes Intravenous  Once 01/21/22 2211 01/21/22 2225       Subjective: Patient seen and examined the bedside today.  Hemodynamically stable.  Lying in bed.  Not in any kind of distress, opens his eyes on calling his name but does not want to speak.  Shouts  when touching him.  Objective: Vitals:   02/01/22 1644 02/01/22 2051 02/01/22 2347 02/02/22 0609  BP: 105/61 112/68 136/76 128/63  Pulse: 68 73 72 76  Resp: 14  18 20   Temp: 98.4 F (36.9 C) 98.6 F (37 C) 98.6 F (37 C) 98 F (36.7 C)  TempSrc: Oral Axillary  Oral Oral  SpO2: 100% 100% 99% 98%  Weight:       No intake or output data in the 24 hours ending 02/02/22 1110  Filed Weights   01/27/22 0500 01/30/22 0500 01/31/22 0500  Weight: 52.9 kg 41.1 kg 48.5 kg    Examination:  General exam: Overall comfortable, not in distress, looks very deconditioned, malnourished HEENT: PERRL Respiratory system:  no wheezes or crackles  Cardiovascular system: S1 & S2 heard, RRR.  Gastrointestinal system: Abdomen is nondistended, soft and nontender. Central nervous system: Alert and awake, orientation could not be evaluated Extremities: No edema, no clubbing ,no cyanosis Skin: No rashes, no ulcers,no icterus  ,tattos    Data Reviewed: I have personally reviewed following labs and imaging studies  CBC: Recent Labs  Lab 01/27/22 0417 01/28/22 0625 01/29/22 0550 02/01/22 0724  WBC 6.5 5.9 8.6 9.7  HGB 7.5* 7.2* 7.7* 7.6*  HCT 23.9* 22.8* 24.2* 23.4*  MCV 90.2 89.8 89.0 85.4  PLT 53* 62* 82* 104*   Basic Metabolic Panel: Recent Labs  Lab 01/27/22 0417 01/28/22 0625 01/29/22 0550  NA 139 136 137  K 4.2 3.9 3.8  CL 110 105 106  CO2 26 25 27   GLUCOSE 64* 100* 118*  BUN 18 15 15   CREATININE 0.64 0.70 0.74  CALCIUM 7.8* 7.8* 8.0*  MG 1.8 1.9 1.9  PHOS 2.9 3.2 3.1     Recent Results (from the past 240 hour(s))  Culture, blood (Routine X 2) w Reflex to ID Panel     Status: None   Collection Time: 01/26/22  8:09 AM   Specimen: BLOOD  Result Value Ref Range Status   Specimen Description BLOOD LEFT ARM  Final   Special Requests   Final    AEROBIC BOTTLE ONLY Blood Culture results may not be optimal due to an excessive volume of blood received in culture bottles   Culture   Final    NO GROWTH 5 DAYS Performed at [facility], [address] [place], [facility] [address]    Report Status 01/31/2022 FINAL  Final  Culture, blood (Routine X 2) w Reflex to ID Panel     Status: None   Collection Time: 01/26/22 10:14 AM    Specimen: BLOOD RIGHT HAND  Result Value Ref Range Status   Specimen Description BLOOD RIGHT HAND  Final   Special Requests   Final    BOTTLES DRAWN AEROBIC AND ANAEROBIC Blood Culture adequate volume   Culture   Final    NO GROWTH 5 DAYS Performed at [facility], [address], [place], [facility] [address]    Report Status 01/31/2022 FINAL  Final     Radiology Studies: No results found.  Scheduled Meds:  acetaminophen  975 mg Oral Q6H   Chlorhexidine Gluconate Cloth  6 each Topical Daily   enoxaparin (LOVENOX) injection  40 mg Subcutaneous Q24H   feeding supplement  237 mL Oral TID BM   ferrous fumarate-b12-vitamic C-folic acid  1 capsule Oral BID PC   insulin aspart  0-15 Units Subcutaneous TID WC   insulin aspart  0-5 Units Subcutaneous QHS   lidocaine  1 patch Transdermal Q24H   methadone  25 mg Oral Daily   multivitamin with minerals  1 tablet Oral Daily   pantoprazole  40 mg Oral Daily   polyethylene glycol  17 g Oral Daily   Continuous Infusions:  sodium chloride 10 mL/hr at 01/31/22 0659   ceFEPime (MAXIPIME) IV 2 g (02/02/22 0551)   ciprofloxacin 400 mg (02/02/22 0857)     LOS: 11 days   Burnadette Pop, MD [facility] P9/14/2023, 11:10 AM

## 2022-02-02 NOTE — Progress Notes (Signed)
Physical Therapy Treatment Patient Details Name: Earl Moreno MRN: [id number] DOB: 15-Feb-1966 Today's Date: 02/02/2022   History of Present Illness Pt is a 56 y.o. male  with past medical history of multiple admissions for sepsis including pyelonephritis, GN bacteremia and suspected osteomyelitis in his right hip as recently as 01/15/2022 with multiple attempts to treat with IV antibiotics but patient continues to leave AMA, history of heroin abuse, DM 2, splenic infarct, admitted on 01/21/2022 with septic shock initially requiring intubation/ventilation.   PT Comments    Patient generally is unable to follow commands, patient participated with mobility as an automatic response to therapist initiating tasks. + 2 person assistance required for bed mobility and sit to stand transfers. Unable to take steps despite facilitation for weight shifting and maximal multimodal cues. PT will follow in attempts to maximize independence as patient is able to participate.    Recommendations for follow up therapy are one component of a multi-disciplinary discharge planning process, led by the attending physician.  Recommendations may be updated based on patient status, additional functional criteria and insurance authorization.  Follow Up Recommendations  Skilled nursing-short term rehab (<3 hours/day) Can patient physically be transported by private vehicle: No   Assistance Recommended at Discharge Frequent or constant Supervision/Assistance  Patient can return home with the following Two people to help with walking and/or transfers;Two people to help with bathing/dressing/bathroom;Help with stairs or ramp for entrance;Direct supervision/assist for medications management;Assist for transportation;Assistance with cooking/housework;Direct supervision/assist for financial management   Equipment Recommendations       Recommendations for Other Services       Precautions / Restrictions Precautions Precautions:  Fall Restrictions Weight Bearing Restrictions: No     Mobility  Bed Mobility Overal bed mobility: Needs Assistance Bed Mobility: Supine to Sit, Sit to Supine     Supine to sit: Mod assist, +2 for physical assistance Sit to supine: Max assist, +2 for physical assistance   General bed mobility comments: + 2 person assistance required. patient does not initiate tasks and required multimodal cues for follow through of task when therapist initiated the movement. increased assistance required for return to bed and patient resistive at times to movement    Transfers Overall transfer level: Needs assistance   Transfers: Sit to/from Stand Sit to Stand: Mod assist, +2 physical assistance           General transfer comment: maximal multimodal cues required. + 2 person assistance required    Ambulation/Gait             Pre-gait activities: weight shifting assistance provided to the left for advancement of RLE. patient was unable to take a step to the right with maximal cues and faciliation     Stairs             Wheelchair Mobility    Modified Rankin (Stroke Patients Only)       Balance Overall balance assessment: Needs assistance Sitting-balance support: Feet supported Sitting balance-Leahy Scale: Fair Sitting balance - Comments: patient demonstrated postural awareness and does not require physical assistance to maintain sitting balance   Standing balance support: Bilateral upper extremity supported Standing balance-Leahy Scale: Poor Standing balance comment: external support required                            Cognition Arousal/Alertness: Lethargic Behavior During Therapy: Agitated, Flat affect Overall Cognitive Status: Impaired/Different from baseline Area of Impairment: Memory, Following commands, Safety/judgement, Awareness, Problem solving  Current Attention Level: Focused Memory: Decreased recall of precautions,  Decreased short-term memory Following Commands:  (unable to follow single step commands today) Safety/Judgement: Decreased awareness of safety, Decreased awareness of deficits Awareness: Intellectual Problem Solving: Slow processing, Decreased initiation, Difficulty sequencing, Requires verbal cues, Requires tactile cues General Comments: patient generally is unable to follow commands. patient participated with mobility as an automatic response to therapist initiating tasks        Exercises      General Comments        Pertinent Vitals/Pain Pain Assessment Pain Location: unable to state, moans with mobility Pain Descriptors / Indicators: Moaning Pain Intervention(s): Limited activity within patient's tolerance, Patient requesting pain meds-RN notified    Home Living                          Prior Function            PT Goals (current goals can now be found in the care plan section) Acute Rehab PT Goals Patient Stated Goal: patient unable to participate with goal setting PT Goal Formulation: Patient unable to participate in goal setting Time For Goal Achievement: 02/10/22 Progress towards PT goals: Progressing toward goals (patient placed on a trial, slow progress)    Frequency    Min 2X/week (trial)      PT Plan Other (comment) (will place patient on a trial basis)    Co-evaluation PT/OT/SLP Co-Evaluation/Treatment: Yes Reason for Co-Treatment: Complexity of the patient's impairments (multi-system involvement);For patient/therapist safety;To address functional/ADL transfers PT goals addressed during session: Mobility/safety with mobility        AM-PAC PT "6 Clicks" Mobility   Outcome Measure  Help needed turning from your back to your side while in a flat bed without using bedrails?: A Lot Help needed moving from lying on your back to sitting on the side of a flat bed without using bedrails?: Total Help needed moving to and from a bed to a chair  (including a wheelchair)?: Total Help needed standing up from a chair using your arms (e.g., wheelchair or bedside chair)?: Total Help needed to walk in hospital room?: Total Help needed climbing 3-5 steps with a railing? : Total 6 Click Score: 7    End of Session   Activity Tolerance: Treatment limited secondary to agitation Patient left: in bed;with call bell/phone within reach;with bed alarm set (telesitter)   PT Visit Diagnosis: Muscle weakness (generalized) (M62.81);Difficulty in walking, not elsewhere classified (R26.2);Unsteadiness on feet (R26.81)     Time: 5797-2820 PT Time Calculation (min) (ACUTE ONLY): 26 min  Charges:  $Therapeutic Activity: 8-22 mins                     Donna Bernard, PT, MPT   Ina Homes 02/02/2022, 3:22 PM

## 2022-02-03 LAB — BASIC METABOLIC PANEL
Anion gap: 6 (ref 5–15)
BUN: 10 mg/dL (ref 6–20)
CO2: 26 mmol/L (ref 22–32)
Calcium: 7.7 mg/dL — ABNORMAL LOW (ref 8.9–10.3)
Chloride: 104 mmol/L (ref 98–111)
Creatinine, Ser: 0.61 mg/dL (ref 0.61–1.24)
GFR, Estimated: >60 mL/min (ref >60)
Glucose, Bld: 128 mg/dL — ABNORMAL HIGH (ref 70–99)
Potassium: 3.3 mmol/L — ABNORMAL LOW (ref 3.5–5.1)
Sodium: 136 mmol/L (ref 135–145)

## 2022-02-03 LAB — CBC
HCT: 23 % — ABNORMAL LOW (ref 39.0–52.0)
Hemoglobin: 7.3 g/dL — ABNORMAL LOW (ref 13.0–17.0)
MCH: 28.2 pg (ref 26.0–34.0)
MCHC: 31.7 g/dL (ref 30.0–36.0)
MCV: 88.8 fL (ref 80.0–100.0)
Platelets: 119 10*3/uL — ABNORMAL LOW (ref 150–400)
RBC: 2.59 MIL/uL — ABNORMAL LOW (ref 4.22–5.81)
RDW: 14.9 % (ref 11.5–15.5)
WBC: 10.5 10*3/uL (ref 4.0–10.5)
nRBC: 0 % (ref 0.0–0.2)

## 2022-02-03 LAB — GLUCOSE, CAPILLARY
Glucose-Capillary: 105 mg/dL — ABNORMAL HIGH (ref 70–99)
Glucose-Capillary: 166 mg/dL — ABNORMAL HIGH (ref 70–99)
Glucose-Capillary: 101 mg/dL — ABNORMAL HIGH (ref 70–99)
Glucose-Capillary: 106 mg/dL — ABNORMAL HIGH (ref 70–99)

## 2022-02-03 MED ORDER — POTASSIUM CHLORIDE CRYS ER 20 MEQ PO TBCR
40.0000 meq | EXTENDED_RELEASE_TABLET | Freq: Once | ORAL | Status: AC
  Administered 2022-02-03: 40 meq via ORAL
  Filled 2022-02-03: qty 2

## 2022-02-03 MED ORDER — METHADONE HCL 10 MG PO TABS
20.0000 mg | ORAL_TABLET | Freq: Every day | ORAL | Status: DC
  Administered 2022-02-04 – 2022-02-08 (×4): 20 mg via ORAL
  Filled 2022-02-03 (×5): qty 2

## 2022-02-03 MED ORDER — SENNA 8.6 MG PO TABS
1.0000 | ORAL_TABLET | Freq: Two times a day (BID) | ORAL | Status: DC
  Administered 2022-02-03 – 2022-02-08 (×6): 8.6 mg via ORAL
  Filled 2022-02-03 (×9): qty 1

## 2022-02-03 NOTE — Progress Notes (Signed)
PROGRESS NOTE  Earl Moreno  WLS:[id number] DOB: 15-May-1966 DOA: 01/21/2022 PCP: Pcp, No   Brief Narrative:  Patient is a 56 year old male with history of IV drug use, recurrent hospitalization and leaving AMA who presented here on 01/21/2022 with complaint of not feeling well.  He was admitted multiple times in the past for sepsis including pyelonephritis, bacteremia, osteomyelitis of the right hip.  There had been multiple attempts to treat with IV antibiotics, perform TEE to rule out endocarditis but he continued to leave AMA.  On presentation, he was febrile with leukocytosis, tachycardic, tachypneic, lab work showed elevated lactic acid,pro-calcitonin.  UDS positive for cocaine, opiates.  Chest x-ray did not show any pneumonia. He became combative, febrile, tachycardic.  Patient was given Ativan, Haldol, restrained.  Precedex was started but even at maximum dose, he was still agitated so had to be intubated.  Admitted under ICU.   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 He  was started management  for severe sepsis, septic shock. Also transferred to [facility] for TEE and cardiothoracic surgery evaluation but he had been concluded not to be a candidate for surgery.  Patient was finally extubated on 9/6.  Currently he is on broad-spectrum antibiotics with cefepime, ciprofloxacin, repeat blood cultures have been negative.  Psych was also consulted for opioid withdrawal and started on methadone.  Palliative care was also following for goals of care discussion, currently the CODE STATUS is DNR.  Patient transferred to [facility] ([facility]) service on 9/8.  Currently he is hemodynamically stable.  PT/OT recommending skilled nursing facility on discharge.  TOC following.  Looks like this  will be a prolonged hospitalization.  Assessment & Plan:  Principal Problem:   Septic shock (HCC) Active Problems:   Bacteremia   Aortic valve endocarditis   Altered mental status    Osteomyelitis (HCC)   Essential hypertension   Acute HFrEF (heart failure with reduced ejection fraction) (HCC)   Polysubstance (including opioids) dependence with physiol dependence (HCC)   Type 2 diabetes mellitus (HCC)   Normocytic anemia   Thrombocytopenia (HCC)   Protein-calorie malnutrition, severe  Severe sepsis/septic shock/Saturday bacteremia: History of multiple admissions with infections including pyelonephritis, osteomyelitis.  Left AMA so not treated. Required intubation, required pressures for hypotension on admission. Blood cultures showed Serratia.  ID following, on cefepime and ciprofloxacin, plan to be continued for 6 weeks, till March 13, 2022.Marland Kitchen  Repeat blood cultures have been negative.  Patient needs long-term antibiotics and prolonged hospitalization due to history of IV drug use. Currently hemodynamically stable.  Aortic valve endocarditis: Secondary to bacteremia.  Seen by cardiothoracic surgery, not a candidate for surgical intervention.  Continue current antibiotics  Agitation/altered mental status: Hospital course remarkable for severe agitation requiring Precedex, intubation.  Altered mental status suspected to be from toxic metabolic encephalopathy from sepsis, drugs, withdrawal.  Psychiatry is following.  Currently on methadone.  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  No abscess or underlying ulcer.  Continue current antibiotics  Systolic congestive heart failure: Currently euvolemic.  Echo showed EF of 40 to 45%, global hypokinesis.  Most likely secondary to aortic endocarditis.  Continue current medications  Hypertension: On amlodipine at home.  Initially required pressors for septic shock.  Currently amlodipine on hold.  Monitor blood pressure,stable to soft  Hypokalemia: Supplemented with potassium  Polysubstance abuse: History of IV drug use.  Psychiatry following.  Diabetes type 2: Monitor blood  sugars, continue current insulin regimen.A1c of  9.4  Iron deficiency anemia/ thrombocytopenia: No evidence of acute bleeding.  Likely multifactorial including sepsis, nutritional deficiency.  Also found to have low iron.  Started on oral iron supplementation.  Normal vitamin B12.  Hemoglobin has remained stable in the range of 7. Patient also developed thrombocytopenia during this hospitalization, now improving.  HIT panel negative.  Severe protein calorie malnutrition: Dietitian following        Nutrition Problem: Severe Malnutrition Etiology: social / environmental circumstances Pressure Injury 01/25/22 Buttocks Left Deep Tissue Pressure Injury - Purple or maroon localized area of discolored intact skin or blood-filled blister due to damage of underlying soft tissue from pressure and/or shear. (Active)  01/25/22 0830  Location: Buttocks  Location Orientation: Left  Staging: Deep Tissue Pressure Injury - Purple or maroon localized area of discolored intact skin or blood-filled blister due to damage of underlying soft tissue from pressure and/or shear.  Wound Description (Comments):   Present on Admission: No  Dressing Type Foam - Lift dressing to assess site every shift 02/03/22 0945    DVT prophylaxis:enoxaparin (LOVENOX) injection 40 mg Start: 01/26/22 2200 SCDs Start: 01/22/22 0456     Code Status: DNR  Family Communication: None at bedside  Patient status:Inpatient  Patient is from :Home  Anticipated discharge to:Not sure at the moment.  PT recommending SNF on DC  Estimated DC date: Not sure   Consultants: ID, critical care, cardiothoracic surgery  Procedures: Intubation  Antimicrobials:  Anti-infectives (From admission, onward)    Start     Dose/Rate Route Frequency Ordered Stop   01/25/22 0600  ceFEPIme (MAXIPIME) 2 g in sodium chloride 0.9 % 100 mL IVPB        2 g 200 mL/hr over 30 Minutes Intravenous Every 8 hours 01/24/22 1523     01/24/22 1800   ciprofloxacin (CIPRO) IVPB 400 mg        400 mg 200 mL/hr over 60 Minutes Intravenous Every 12 hours 01/24/22 1523     01/23/22 0600  vancomycin (VANCOREADY) IVPB 1250 mg/250 mL  Status:  Discontinued        1,250 mg 166.7 mL/hr over 90 Minutes Intravenous Every 24 hours 01/22/22 1921 01/23/22 1127   01/22/22 1415  meropenem (MERREM) 1 g in sodium chloride 0.9 % 100 mL IVPB        1 g 200 mL/hr over 30 Minutes Intravenous Every 8 hours 01/22/22 1318 01/24/22 2301   01/22/22 1330  ceFEPIme (MAXIPIME) 2 g in sodium chloride 0.9 % 100 mL IVPB  Status:  Discontinued        2 g 200 mL/hr over 30 Minutes Intravenous Every 8 hours 01/22/22 0543 01/22/22 1301   01/22/22 0600  vancomycin (VANCOREADY) IVPB 1250 mg/250 mL  Status:  Discontinued        1,250 mg 166.7 mL/hr over 90 Minutes Intravenous Every 24 hours 01/22/22 0547 01/22/22 1333   01/21/22 2230  vancomycin (VANCOREADY) IVPB 1250 mg/250 mL  Status:  Discontinued        1,250 mg 166.7 mL/hr over 90 Minutes Intravenous  Once 01/21/22 2226 01/22/22 0547   01/21/22 2215  ceFEPIme (MAXIPIME) 2 g in sodium chloride 0.9 % 100 mL IVPB        2 g 200 mL/hr over 30 Minutes Intravenous  Once 01/21/22 2211 01/22/22 0641   01/21/22 2215  metroNIDAZOLE (FLAGYL) IVPB 500 mg        500 mg 100 mL/hr over 60 Minutes Intravenous  Once 01/21/22 2211 01/22/22 6269  01/21/22 2215  vancomycin (VANCOCIN) IVPB 1000 mg/200 mL premix  Status:  Discontinued        1,000 mg 200 mL/hr over 60 Minutes Intravenous  Once 01/21/22 2211 01/21/22 2225       Subjective: Patient seen and examined at the bedside today.  Looks comfortable, lying in bed.  Not agitated.  Looks more alert and awake.  When asked how is he doing he said fine but did not talk more than that  Objective: Vitals:   02/02/22 2346 02/03/22 0406 02/03/22 0615 02/03/22 0912  BP: 105/60 (!) 129/59  122/76  Pulse: 69 79  85  Resp: 19 19  18   Temp: 98.4 F (36.9 C) (!) 97.5 F (36.4 C)  98.4 F  (36.9 C)  TempSrc: Oral Oral  Oral  SpO2: 98% 98%  100%  Weight:   47.2 kg     Intake/Output Summary (Last 24 hours) at 02/03/2022 1220 Last data filed at 02/02/2022 1925 Gross per 24 hour  Intake 240 ml  Output 200 ml  Net 40 ml    Filed Weights   01/30/22 0500 01/31/22 0500 02/03/22 0615  Weight: 41.1 kg 48.5 kg 47.2 kg    Examination:  General exam: Overall comfortable, not in distress, very deconditioned, malnourished HEENT: PERRL Respiratory system:  no wheezes or crackles  Cardiovascular system: S1 & S2 heard, RRR.  Gastrointestinal system: Abdomen is nondistended, soft and nontender. Central nervous system: Alert and awake but not sure he is oriented Extremities: No edema, no clubbing ,no cyanosis Skin: No rashes, no ulcers,no icterus  ,tattoos   Data Reviewed: I have personally reviewed following labs and imaging studies  CBC: Recent Labs  Lab 01/28/22 0625 01/29/22 0550 02/01/22 0724 02/03/22 0522  WBC 5.9 8.6 9.7 10.5  HGB 7.2* 7.7* 7.6* 7.3*  HCT 22.8* 24.2* 23.4* 23.0*  MCV 89.8 89.0 85.4 88.8  PLT 62* 82* 104* 119*   Basic Metabolic Panel: Recent Labs  Lab 01/28/22 0625 01/29/22 0550 02/03/22 0522  NA 136 137 136  K 3.9 3.8 3.3*  CL 105 106 104  CO2 25 27 26   GLUCOSE 100* 118* 128*  BUN 15 15 10   CREATININE 0.70 0.74 0.61  CALCIUM 7.8* 8.0* 7.7*  MG 1.9 1.9  --   PHOS 3.2 3.1  --      Recent Results (from the past 240 hour(s))  Culture, blood (Routine X 2) w Reflex to ID Panel     Status: None   Collection Time: 01/26/22  8:09 AM   Specimen: BLOOD  Result Value Ref Range Status   Specimen Description BLOOD LEFT ARM  Final   Special Requests   Final    AEROBIC BOTTLE ONLY Blood Culture results may not be optimal due to an excessive volume of blood received in culture bottles   Culture   Final    NO GROWTH 5 DAYS Performed at [facility], [address] [place], [facility] [address]    Report Status 01/31/2022 FINAL   Final  Culture, blood (Routine X 2) w Reflex to ID Panel     Status: None   Collection Time: 01/26/22 10:14 AM   Specimen: BLOOD RIGHT HAND  Result Value Ref Range Status   Specimen Description BLOOD RIGHT HAND  Final   Special Requests   Final    BOTTLES DRAWN AEROBIC AND ANAEROBIC Blood Culture adequate volume   Culture   Final    NO GROWTH 5 DAYS Performed at [facility]  Lab, [address] [place] [address] [place], [place] [address]    Report Status 01/31/2022 FINAL  Final     Radiology Studies: No results found.  Scheduled Meds:  acetaminophen  975 mg Oral Q6H   Chlorhexidine Gluconate Cloth  6 each Topical Daily   enoxaparin (LOVENOX) injection  40 mg Subcutaneous Q24H   feeding supplement  237 mL Oral TID BM   Ferrous Fumarate  1 tablet Oral BID   folic acid  1 mg Oral Daily   insulin aspart  0-15 Units Subcutaneous TID WC   insulin aspart  0-5 Units Subcutaneous QHS   lidocaine  1 patch Transdermal Q24H   methadone  25 mg Oral Daily   multivitamin with minerals  1 tablet Oral Daily   pantoprazole  40 mg Oral Daily   polyethylene glycol  17 g Oral Daily   senna  1 tablet Oral BID   Continuous Infusions:  sodium chloride 10 mL/hr at 01/31/22 0659   ceFEPime (MAXIPIME) IV 2 g (02/03/22 0640)   ciprofloxacin 400 mg (02/03/22 0853)     LOS: 12 days   Burnadette Pop, MD [facility] P9/15/2023, 12:20 PM

## 2022-02-03 NOTE — Consult Note (Signed)
Psychiatry: Brief follow-up for this 56 year old man with a history of opiate use disorder who is being treated for multiple deep infections.  Methadone maintenance is being used in the hospital preventing opiate withdrawal allowing patient to tolerate treatment.  Treatment team has noticed that the patient has sedation that varies throughout the day often more sedated in the afternoon and less so earlier in the morning.  Came to see patient this afternoon found him asleep arousable but not to the point of being able to carry on a conversation.  He seemed to be breathing comfortably in his sleep no sign of physical distress.  Reviewed medications.  He has PRNs ordered but none of them have been used recently and he is not receiving any medication today that would be sedating other than the methadone.  It is possible that even at this dose the methadone is causing sedation possibly due to slow her metabolism.  Fortunately it does not seem to be causing sedation that is physically impairing.  As long as he is still breathing safely and has parts of the day during which she is able to be awake to take care of basic ADLs I would not worry about it too much.  I will go ahead and reduce his methadone dose from 25 mg to 20 mg.  This can be changed at any time if this does not work out.

## 2022-02-03 NOTE — Progress Notes (Signed)
   Date of Admission:  01/21/2022     ID: Earl Moreno is a 56 y.o. 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)   Essential hypertension   Protein-calorie malnutrition, severe   Altered mental status   Bacteremia   Aortic valve endocarditis   Acute HFrEF (heart failure with reduced ejection fraction) (HCC)   Osteomyelitis (HCC)   Normocytic anemia   Thrombocytopenia (HCC)    Subjective: Pt was apparently awake and alert this morning Now he is somnolent   Medications:   acetaminophen  975 mg Oral Q6H   Chlorhexidine Gluconate Cloth  6 each Topical Daily   enoxaparin (LOVENOX) injection  40 mg Subcutaneous Q24H   feeding supplement  237 mL Oral TID BM   Ferrous Fumarate  1 tablet Oral BID   folic acid  1 mg Oral Daily   insulin aspart  0-15 Units Subcutaneous TID WC   insulin aspart  0-5 Units Subcutaneous QHS   lidocaine  1 patch Transdermal Q24H   methadone  25 mg Oral Daily   multivitamin with minerals  1 tablet Oral Daily   pantoprazole  40 mg Oral Daily   polyethylene glycol  17 g Oral Daily   senna  1 tablet Oral BID    Objective: Vital signs in last 24 hours: Temp:  [97.5 F (36.4 C)-98.4 F (36.9 C)] 97.6 F (36.4 C) (09/15 1225) Pulse Rate:  [69-85] 73 (09/15 1225) Resp:  [18-20] 18 (09/15 1225) BP: (100-129)/(42-76) 100/42 (09/15 1225) SpO2:  [98 %-100 %] 98 % (09/15 1225) Weight:  [47.2 kg] 47.2 kg (09/15 0615)    PHYSICAL EXAM:  General: somnolent Lungs: b/l air entry Heart: s1s2 Abdomen: Soft, non-tender,not distended. Bowel sounds normal. No masses Neurologic: mcannot assess  Lab Results Recent Labs    02/01/22 0724 02/03/22 0522  WBC 9.7 10.5  HGB 7.6* 7.3*  HCT 23.4* 23.0*  NA  --  136  K  --  3.3*  CL  --  104  CO2  --  26  BUN  --  10  CREATININE  --  0.61     Microbiology: 01/22/22 -  serratia 01/26/22 BC neg    Assessment/Plan: Encephalopathy secondary to sepsis and infarct - improved But now it is more influenced by the meds ?   Serratia bacteremia with aortic valve endocarditis On dual coverage with cefepime + cipro Repeat blood culture neg from 01/26/22 Will need for 6 weeks-- Apr 01, 2022 Watch closely as both can cause encephalopathy  IVDA  heroin user - now on methadone  25mg    -No resp distress   hypokalemia  Anemia   Thrombocytopenia  Osteomyelitis rt pubic ramus  Previous bacteremias including serratia , enterobacter- not treated because of him leaving AMA ?  Discussed the management with care team

## 2022-02-04 LAB — BASIC METABOLIC PANEL
Anion gap: 4 — ABNORMAL LOW (ref 5–15)
BUN: 11 mg/dL (ref 6–20)
CO2: 26 mmol/L (ref 22–32)
Calcium: 8.2 mg/dL — ABNORMAL LOW (ref 8.9–10.3)
Chloride: 104 mmol/L (ref 98–111)
Creatinine, Ser: 0.62 mg/dL (ref 0.61–1.24)
GFR, Estimated: >60 mL/min (ref >60)
Glucose, Bld: 116 mg/dL — ABNORMAL HIGH (ref 70–99)
Potassium: 3.5 mmol/L (ref 3.5–5.1)
Sodium: 134 mmol/L — ABNORMAL LOW (ref 135–145)

## 2022-02-04 LAB — GLUCOSE, CAPILLARY
Glucose-Capillary: 108 mg/dL — ABNORMAL HIGH (ref 70–99)
Glucose-Capillary: 123 mg/dL — ABNORMAL HIGH (ref 70–99)
Glucose-Capillary: 136 mg/dL — ABNORMAL HIGH (ref 70–99)

## 2022-02-04 NOTE — TOC Progression Note (Signed)
Transition of Care [facility]) - Progression Note    Patient Details  Name: Earl Moreno MRN: [id number] Date of Birth: 01-09-1966  [facility] CM/SW Contact  Izola Price, RN Phone Number: 02/04/2022, 2:28 PM  Clinical Narrative:  02/04/22: Patient has been made a DNR as of today. Simmie Davies RN CM      Expected Discharge Plan: Skilled Nursing Facility Barriers to Discharge: Continued Medical Work up  Expected Discharge Plan and Services Expected Discharge Plan: [place]   Discharge Planning Services: CM Consult   Living arrangements for the past 2 months: Apartment, Homeless                 DME Arranged: N/A DME Agency: NA                   Social Determinants of Health (SDOH) Interventions    Readmission Risk Interventions    01/23/2022   10:21 AM  Readmission Risk Prevention Plan  Transportation Screening Complete  Medication Review ([place]) Complete  PCP or Specialist appointment within 3-5 days of discharge Complete  HRI or [place] Complete  SW Recovery Care/Counseling Consult Complete  [place] Not Applicable

## 2022-02-04 NOTE — Progress Notes (Signed)
Patient refused nightly medications including Red Meds Tylenol, Lovenox, iron, and senna. On call provider notified.

## 2022-02-04 NOTE — Progress Notes (Signed)
PROGRESS NOTE    Earl Moreno  OMV:[id number] DOB: 28-Aug-1965 DOA: 01/21/2022 PCP: Pcp, No   Brief Narrative:  Patient is a 56 year old male with history of IV drug use, recurrent hospitalization and leaving AMA who presented here on 01/21/2022 with complaint of not feeling well.  He was admitted multiple times in the past for sepsis including pyelonephritis, bacteremia, osteomyelitis of the right hip.  There had been multiple attempts to treat with IV antibiotics, perform TEE to rule out endocarditis but he continued to leave AMA.  On presentation, he was febrile with leukocytosis, tachycardic, tachypneic, lab work showed elevated lactic acid,pro-calcitonin.  UDS positive for cocaine, opiates.  Chest x-ray did not show any pneumonia. He became combative, febrile, tachycardic.  Patient was given Ativan, Haldol, restrained.  Precedex was started but even at maximum dose, he was still agitated so had to be intubated.  Admitted under ICU.   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 He  was started management  for severe sepsis, septic shock. Also transferred to [facility] for TEE and cardiothoracic surgery evaluation but he had been concluded not to be a candidate for surgery.  Patient was finally extubated on 9/6.  Currently he is on broad-spectrum antibiotics with cefepime, ciprofloxacin, repeat blood cultures have been negative.  Psych was also consulted for opioid withdrawal and started on methadone.  Palliative care was also following for goals of care discussion, currently the CODE STATUS is DNR.  Patient transferred to [facility] service on 9/8.  Currently he is hemodynamically stable.  PT/OT recommending skilled nursing facility on discharge.  TOC following.  Looks like this  will be a prolonged hospitalization.  Assessment & Plan:   Severe sepsis/septic shock/Saturday bacteremia:  -Required intubation, required pressures for hypotension on admission. -Blood  cultures showed Serratia.   -ID following, on cefepime and ciprofloxacin, plan to be continued for 6 weeks, till 03-24-22.  - Repeat blood cultures have been negative.  Patient needs long-term antibiotics and prolonged hospitalization due to history of IV drug use. -Currently hemodynamically stable.   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  Seen by cardiothoracic surgery, not a candidate for surgical intervention.   -Continue current antibiotics   Agitation/altered mental status:  [facility] course remarkable for severe agitation requiring Precedex, intubation.  Altered mental status suspected to be from toxic metabolic encephalopathy from sepsis, drugs, withdrawal.  Psychiatry is following.  Currently on methadone.     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  No abscess or underlying ulcer.  Continue current antibiotics   Systolic congestive heart failure: Currently euvolemic.  Echo showed EF of 40 to 45%, global hypokinesis.   -Most likely secondary to aortic endocarditis.    Hypertension: On amlodipine at home.  Initially required pressors for septic shock.  Currently amlodipine on hold.  Monitor blood pressure-stable to soft   Hypokalemia: Resolved   Polysubstance abuse: History of IV drug use.  Psychiatry following.   Controlled diabetes type 2: A1c 9.4%.  Continue sliding scale insulin.  Monitor blood sugar closely.     Iron deficiency anemia/ thrombocytopenia: No evidence of acute bleeding.  Likely multifactorial including sepsis, nutritional deficiency.  Also found to have low iron.  -on oral iron supplementation.  Normal vitamin B12.  Hemoglobin has remained stable in the range of 7. -Patient also developed thrombocytopenia during this hospitalization, now improving. -HIT panel negative.   Severe protein calorie malnutrition: Dietitian following -On feeding supplements, folic acid,  multivitamins  DVT prophylaxis:  Lovenox Code Status: DNR Family Communication:  None present at bedside.  Plan of care discussed with patient in length and he verbalized understanding and agreed with it. Disposition Plan: To be determined  Consultants:  PCCM, cardiothoracic surgery, ID  Procedures:  Intubation TEE  Antimicrobials:  Ciprofloxacin Cefepime  Status is: Inpatient     Subjective: Seen and examined.  Alert and awake.  Did not want to talk.  Appears sad.  He has mittens in his hand and asking why he has it.  No acute events overnight.  Objective: Vitals:   02/03/22 2111 02/04/22 0345 02/04/22 0536 02/04/22 0917  BP: 118/69 120/64  (!) 120/54  Pulse: 76 68  68  Resp: 18 (!) 22  17  Temp: 98.2 F (36.8 C) 98 F (36.7 C)  98.6 F (37 C)  TempSrc: Oral Oral  Oral  SpO2: 100% 99%  100%  Weight:   46.8 kg     Intake/Output Summary (Last 24 hours) at 02/04/2022 1218 Last data filed at 02/04/2022 1029 Gross per 24 hour  Intake 744.83 ml  Output --  Net 744.83 ml   Filed Weights   01/31/22 0500 02/03/22 0615 02/04/22 0536  Weight: 48.5 kg 47.2 kg 46.8 kg    Examination:  General exam: Appears calm and comfortable, thin and lean, on room air respiratory system: Clear to auscultation. Respiratory effort normal. Cardiovascular system: S1 & S2 heard, RRR. No JVD, murmurs, rubs, gallops or clicks. No pedal edema. Gastrointestinal system: Abdomen is nondistended, soft and nontender. No organomegaly or masses felt. Normal bowel sounds heard. Central nervous system: Alert, following commands Skin: No rashes, lesions or ulcers Psychiatry: Appears sad   Data Reviewed: I have personally reviewed following labs and imaging studies  CBC: Recent Labs  Lab 01/29/22 0550 02/01/22 0724 02/03/22 0522  WBC 8.6 9.7 10.5  HGB 7.7* 7.6* 7.3*  HCT 24.2* 23.4* 23.0*  MCV 89.0 85.4 88.8  PLT 82* 104* 119*   Basic Metabolic Panel: Recent Labs  Lab 01/29/22 0550 02/03/22 0522 02/04/22 0610  NA  137 136 134*  K 3.8 3.3* 3.5  CL 106 104 104  CO2 27 26 26   GLUCOSE 118* 128* 116*  BUN 15 10 11   CREATININE 0.74 0.61 0.62  CALCIUM 8.0* 7.7* 8.2*  MG 1.9  --   --   PHOS 3.1  --   --    GFR: Estimated Creatinine Clearance: 67.4 mL/min (by C-G formula based on SCr of 0.62 mg/dL). Liver Function Tests: No results for input(s): "AST", "ALT", "ALKPHOS", "BILITOT", "PROT", "ALBUMIN" in the last 168 hours. No results for input(s): "LIPASE", "AMYLASE" in the last 168 hours. No results for input(s): "AMMONIA" in the last 168 hours. Coagulation Profile: No results for input(s): "INR", "PROTIME" in the last 168 hours. Cardiac Enzymes: No results for input(s): "CKTOTAL", "CKMB", "CKMBINDEX", "TROPONINI" in the last 168 hours. BNP (last 3 results) No results for input(s): "PROBNP" in the last 8760 hours. HbA1C: No results for input(s): "HGBA1C" in the last 72 hours. CBG: Recent Labs  Lab 02/03/22 0905 02/03/22 1229 02/03/22 1732 02/03/22 2123 02/04/22 0830  GLUCAP 105* 166* 101* 106* 108*   Lipid Profile: No results for input(s): "CHOL", "HDL", "LDLCALC", "TRIG", "CHOLHDL", "LDLDIRECT" in the last 72 hours. Thyroid Function Tests: No results for input(s): "TSH", "T4TOTAL", "FREET4", "T3FREE", "THYROIDAB" in the last 72 hours. Anemia Panel: No results for input(s): "VITAMINB12", "FOLATE", "FERRITIN", "TIBC", "IRON", "RETICCTPCT" in the last 72 hours. Sepsis  Labs: No results for input(s): "PROCALCITON", "LATICACIDVEN" in the last 168 hours.  Recent Results (from the past 240 hour(s))  Culture, blood (Routine X 2) w Reflex to ID Panel     Status: None   Collection Time: 01/26/22  8:09 AM   Specimen: BLOOD  Result Value Ref Range Status   Specimen Description BLOOD LEFT ARM  Final   Special Requests   Final    AEROBIC BOTTLE ONLY Blood Culture results may not be optimal due to an excessive volume of blood received in culture bottles   Culture   Final    NO GROWTH 5  DAYS Performed at [facility], [address] [place], [place] [address]    Report Status 01/31/2022 FINAL  Final  Culture, blood (Routine X 2) w Reflex to ID Panel     Status: None   Collection Time: 01/26/22 10:14 AM   Specimen: BLOOD RIGHT HAND  Result Value Ref Range Status   Specimen Description BLOOD RIGHT HAND  Final   Special Requests   Final    BOTTLES DRAWN AEROBIC AND ANAEROBIC Blood Culture adequate volume   Culture   Final    NO GROWTH 5 DAYS Performed at [facility], [address] [place], [place] [address]    Report Status 01/31/2022 FINAL  Final      Radiology Studies: No results found.  Scheduled Meds:  acetaminophen  975 mg Oral Q6H   Chlorhexidine Gluconate Cloth  6 each Topical Daily   enoxaparin (LOVENOX) injection  40 mg Subcutaneous Q24H   feeding supplement  237 mL Oral TID BM   Ferrous Fumarate  1 tablet Oral BID   folic acid  1 mg Oral Daily   insulin aspart  0-15 Units Subcutaneous TID WC   insulin aspart  0-5 Units Subcutaneous QHS   lidocaine  1 patch Transdermal Q24H   methadone  20 mg Oral Daily   multivitamin with minerals  1 tablet Oral Daily   pantoprazole  40 mg Oral Daily   polyethylene glycol  17 g Oral Daily   senna  1 tablet Oral BID   Continuous Infusions:  sodium chloride 10 mL/hr at 02/04/22 1029   ceFEPime (MAXIPIME) IV Stopped (02/04/22 3559)   ciprofloxacin Stopped (02/04/22 1024)     LOS: 13 days   Time spent: 35  minutes   Marek Nghiem Loann Quill, MD [facility]  If 7PM-7AM, please contact night-coverage [email or web address] 02/04/2022, 12:18 PM

## 2022-02-05 LAB — GLUCOSE, CAPILLARY
Glucose-Capillary: 100 mg/dL — ABNORMAL HIGH (ref 70–99)
Glucose-Capillary: 103 mg/dL — ABNORMAL HIGH (ref 70–99)
Glucose-Capillary: 80 mg/dL (ref 70–99)

## 2022-02-05 LAB — CBC
HCT: 22.5 % — ABNORMAL LOW (ref 39.0–52.0)
Hemoglobin: 7.1 g/dL — ABNORMAL LOW (ref 13.0–17.0)
MCH: 27.5 pg (ref 26.0–34.0)
MCHC: 31.6 g/dL (ref 30.0–36.0)
MCV: 87.2 fL (ref 80.0–100.0)
Platelets: 114 10*3/uL — ABNORMAL LOW (ref 150–400)
RBC: 2.58 MIL/uL — ABNORMAL LOW (ref 4.22–5.81)
RDW: 14.9 % (ref 11.5–15.5)
WBC: 12.2 10*3/uL — ABNORMAL HIGH (ref 4.0–10.5)
nRBC: 0 % (ref 0.0–0.2)

## 2022-02-05 LAB — BASIC METABOLIC PANEL
Anion gap: 4 — ABNORMAL LOW (ref 5–15)
BUN: 10 mg/dL (ref 6–20)
CO2: 25 mmol/L (ref 22–32)
Calcium: 8 mg/dL — ABNORMAL LOW (ref 8.9–10.3)
Chloride: 104 mmol/L (ref 98–111)
Creatinine, Ser: 0.58 mg/dL — ABNORMAL LOW (ref 0.61–1.24)
GFR, Estimated: >60 mL/min (ref >60)
Glucose, Bld: 85 mg/dL (ref 70–99)
Potassium: 3.3 mmol/L — ABNORMAL LOW (ref 3.5–5.1)
Sodium: 133 mmol/L — ABNORMAL LOW (ref 135–145)

## 2022-02-05 LAB — MAGNESIUM: Magnesium: 1.9 mg/dL (ref 1.7–2.4)

## 2022-02-05 MED ORDER — SODIUM CHLORIDE 0.9 % IV SOLN: INTRAVENOUS | Status: DC

## 2022-02-05 MED ORDER — POTASSIUM CHLORIDE 20 MEQ PO PACK
40.0000 meq | PACK | Freq: Two times a day (BID) | ORAL | Status: AC
  Administered 2022-02-05 (×2): 40 meq via ORAL
  Filled 2022-02-05 (×2): qty 2

## 2022-02-05 NOTE — Progress Notes (Addendum)
Made Dr. Doristine Bosworth aware blood pressure soft at 90/48 map 62. Md returned page, stated to recheck in 59mins and if remains low, may start on gentle hydration.

## 2022-02-05 NOTE — Progress Notes (Signed)
PROGRESS NOTE    Earl Moreno  NWG:[id number] DOB: [birth year] DOA: 01/21/2022 PCP: Pcp, No   Brief Narrative:  Patient is a 56 year old male with history of IV drug use, recurrent hospitalization and leaving AMA who presented here on 01/21/2022 with complaint of not feeling well.  He was admitted multiple times in the past for sepsis including pyelonephritis, bacteremia, osteomyelitis of the right hip.  There had been multiple attempts to treat with IV antibiotics, perform TEE to rule out endocarditis but he continued to leave AMA.  On presentation, he was febrile with leukocytosis, tachycardic, tachypneic, lab work showed elevated lactic acid,pro-calcitonin.  UDS positive for cocaine, opiates.  Chest x-ray did not show any pneumonia. He became combative, febrile, tachycardic.  Patient was given Ativan, Haldol, restrained.  Precedex was started but even at maximum dose, he was still agitated so had to be intubated.  Admitted under ICU.   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 He  was started management  for severe sepsis, septic shock. Also transferred to [facility] for TEE and cardiothoracic surgery evaluation but he had been concluded not to be a candidate for surgery.  Patient was finally extubated on 9/6.  Currently he is on broad-spectrum antibiotics with cefepime, ciprofloxacin, repeat blood cultures have been negative.  Psych was also consulted for opioid withdrawal and started on methadone.  Palliative care was also following for goals of care discussion, currently the CODE STATUS is DNR.  Patient transferred to [facility] service on 9/8.  Currently he is hemodynamically stable.  PT/OT recommending skilled nursing facility on discharge.  TOC following.  Looks like this  will be a prolonged hospitalization.  Assessment & Plan:   Severe sepsis/septic shock/Serratia bacteremia:  -Required intubation, required pressures for hypotension on admission. -Blood  cultures showed Serratia.   -ID following, on cefepime and ciprofloxacin, plan to be continued for 6 weeks, till 2022-03-27.  - Repeat blood cultures have been negative.  - Patient needs long-term antibiotics and prolonged hospitalization due to history of IV drug use. -Currently hemodynamically stable.   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  Seen by cardiothoracic surgery, not a candidate for surgical intervention.   -Continue current antibiotics   Agitation/altered mental status:  [facility] course remarkable for severe agitation requiring Precedex, intubation.  Altered mental status suspected to be from toxic metabolic encephalopathy from sepsis, drugs, withdrawal.   -Psychiatry is following.  Currently on methadone.     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  No abscess or underlying ulcer.  Continue current antibiotics   Systolic congestive heart failure: Currently euvolemic.  Echo showed EF of 40 to 45%, global hypokinesis.   -Most likely secondary to aortic endocarditis.  -Monitor INO's and daily weight.   Hypertension: On amlodipine at home.  Initially required pressors for septic shock.  Currently amlodipine on hold.  blood pressure-stable to soft -Continue to monitor   Hypokalemia: Replenished.  Magnesium: WNL.  Repeat BMP tomorrow a.m.  Polysubstance abuse: History of IV drug use.  Psychiatry following.   Uncontrolled type 2 diabetes with hyperglycemia: A1c 9.4%.  Continue sliding scale insulin.  Monitor blood sugar closely.     Iron deficiency anemia/ thrombocytopenia: No evidence of acute bleeding.  Likely multifactorial including sepsis, nutritional deficiency.  Also found to have low iron.  -on oral iron supplementation.  Normal vitamin B12.  Hemoglobin has remained stable in the range of 7. -Patient also developed thrombocytopenia during this hospitalization,  now improving. -HIT panel negative.   Severe protein  calorie malnutrition: Dietitian following -On feeding supplements, folic acid, multivitamins  DVT prophylaxis: Lovenox Code Status: DNR Family Communication:  None present at bedside.  Plan of care discussed with patient in length and he verbalized understanding and agreed with it. Disposition Plan: To be determined  Consultants:  PCCM, cardiothoracic surgery, ID  Procedures:  Intubation TEE  Antimicrobials:  Ciprofloxacin Cefepime  Status is: Inpatient     Subjective: Patient seen and examined.  Lying comfortably on the bed.  Appears sad.  Denies any complaints.  No acute events overnight.  Objective: Vitals:   02/04/22 2200 02/05/22 0431 02/05/22 0500 02/05/22 0730  BP: 113/64 (!) 95/55  (!) 103/55  Pulse: 84 75  74  Resp: 18 16  16   Temp: 98.7 F (37.1 C) 98.1 F (36.7 C)  97.8 F (36.6 C)  TempSrc: Oral Oral  Oral  SpO2: 93% 100%  100%  Weight:   47.4 kg     Intake/Output Summary (Last 24 hours) at 02/05/2022 1038 Last data filed at 02/05/2022 0925 Gross per 24 hour  Intake 589.78 ml  Output 1350 ml  Net -760.22 ml    Filed Weights   02/03/22 0615 02/04/22 0536 02/05/22 0500  Weight: 47.2 kg 46.8 kg 47.4 kg    Examination:  General exam: Appears calm and comfortable, thin and lean, on room air respiratory system: Clear to auscultation. Respiratory effort normal. Cardiovascular system: S1 & S2 heard, RRR. No JVD, murmurs, rubs, gallops or clicks. No pedal edema. Gastrointestinal system: Abdomen is nondistended, soft and nontender. No organomegaly or masses felt. Normal bowel sounds heard. Central nervous system: Alert, following commands Skin: No rashes, lesions or ulcers Psychiatry: Appears sad   Data Reviewed: I have personally reviewed following labs and imaging studies  CBC: Recent Labs  Lab 02/01/22 0724 02/03/22 0522 02/05/22 0630  WBC 9.7 10.5 12.2*  HGB 7.6* 7.3* 7.1*  HCT 23.4* 23.0* 22.5*  MCV 85.4 88.8 87.2  PLT 104* 119* 114*     Basic Metabolic Panel: Recent Labs  Lab 02/03/22 0522 02/04/22 0610 02/05/22 0630  NA 136 134* 133*  K 3.3* 3.5 3.3*  CL 104 104 104  CO2 26 26 25   GLUCOSE 128* 116* 85  BUN 10 11 10   CREATININE 0.61 0.62 0.58*  CALCIUM 7.7* 8.2* 8.0*  MG  --   --  1.9    GFR: Estimated Creatinine Clearance: 68.3 mL/min (A) (by C-G formula based on SCr of 0.58 mg/dL (L)). Liver Function Tests: No results for input(s): "AST", "ALT", "ALKPHOS", "BILITOT", "PROT", "ALBUMIN" in the last 168 hours. No results for input(s): "LIPASE", "AMYLASE" in the last 168 hours. No results for input(s): "AMMONIA" in the last 168 hours. Coagulation Profile: No results for input(s): "INR", "PROTIME" in the last 168 hours. Cardiac Enzymes: No results for input(s): "CKTOTAL", "CKMB", "CKMBINDEX", "TROPONINI" in the last 168 hours. BNP (last 3 results) No results for input(s): "PROBNP" in the last 8760 hours. HbA1C: No results for input(s): "HGBA1C" in the last 72 hours. CBG: Recent Labs  Lab 02/03/22 2123 02/04/22 0830 02/04/22 1211 02/04/22 1653 02/05/22 0816  GLUCAP 106* 108* 123* 136* 100*    Lipid Profile: No results for input(s): "CHOL", "HDL", "LDLCALC", "TRIG", "CHOLHDL", "LDLDIRECT" in the last 72 hours. Thyroid Function Tests: No results for input(s): "TSH", "T4TOTAL", "FREET4", "T3FREE", "THYROIDAB" in the last 72 hours. Anemia Panel: No results for input(s): "VITAMINB12", "FOLATE", "FERRITIN", "TIBC", "IRON", "RETICCTPCT" in the  last 72 hours. Sepsis Labs: No results for input(s): "PROCALCITON", "LATICACIDVEN" in the last 168 hours.  No results found for this or any previous visit (from the past 240 hour(s)).     Radiology Studies: No results found.  Scheduled Meds:  acetaminophen  975 mg Oral Q6H   Chlorhexidine Gluconate Cloth  6 each Topical Daily   enoxaparin (LOVENOX) injection  40 mg Subcutaneous Q24H   feeding supplement  237 mL Oral TID BM   Ferrous Fumarate  1 tablet  Oral BID   folic acid  1 mg Oral Daily   insulin aspart  0-15 Units Subcutaneous TID WC   insulin aspart  0-5 Units Subcutaneous QHS   lidocaine  1 patch Transdermal Q24H   methadone  20 mg Oral Daily   multivitamin with minerals  1 tablet Oral Daily   pantoprazole  40 mg Oral Daily   polyethylene glycol  17 g Oral Daily   potassium chloride  40 mEq Oral BID   senna  1 tablet Oral BID   Continuous Infusions:  sodium chloride 10 mL/hr at 02/04/22 1747   ceFEPime (MAXIPIME) IV 2 g (02/05/22 0644)   ciprofloxacin 400 mg (02/05/22 0925)     LOS: 14 days   Time spent: 35  minutes   Armanie Martine Loann Quill, MD [facility]  If 7PM-7AM, please contact night-coverage [email or web address] 02/05/2022, 10:38 AM

## 2022-02-05 NOTE — Progress Notes (Signed)
Refused 1200 sugar check

## 2022-02-06 DIAGNOSIS — E43 Unspecified severe protein-calorie malnutrition: Secondary | ICD-10-CM

## 2022-02-06 LAB — GLUCOSE, CAPILLARY
Glucose-Capillary: 232 mg/dL — ABNORMAL HIGH (ref 70–99)
Glucose-Capillary: 166 mg/dL — ABNORMAL HIGH (ref 70–99)
Glucose-Capillary: 112 mg/dL — ABNORMAL HIGH (ref 70–99)
Glucose-Capillary: 126 mg/dL — ABNORMAL HIGH (ref 70–99)

## 2022-02-06 LAB — BASIC METABOLIC PANEL
Anion gap: 5 (ref 5–15)
BUN: 11 mg/dL (ref 6–20)
CO2: 23 mmol/L (ref 22–32)
Calcium: 8 mg/dL — ABNORMAL LOW (ref 8.9–10.3)
Chloride: 107 mmol/L (ref 98–111)
Creatinine, Ser: 0.66 mg/dL (ref 0.61–1.24)
GFR, Estimated: >60 mL/min (ref >60)
Glucose, Bld: 104 mg/dL — ABNORMAL HIGH (ref 70–99)
Potassium: 3.7 mmol/L (ref 3.5–5.1)
Sodium: 135 mmol/L (ref 135–145)

## 2022-02-06 LAB — CBC
HCT: 22.2 % — ABNORMAL LOW (ref 39.0–52.0)
Hemoglobin: 7 g/dL — ABNORMAL LOW (ref 13.0–17.0)
MCH: 27.9 pg (ref 26.0–34.0)
MCHC: 31.5 g/dL (ref 30.0–36.0)
MCV: 88.4 fL (ref 80.0–100.0)
Platelets: 93 10*3/uL — ABNORMAL LOW (ref 150–400)
RBC: 2.51 MIL/uL — ABNORMAL LOW (ref 4.22–5.81)
RDW: 15 % (ref 11.5–15.5)
WBC: 10.5 10*3/uL (ref 4.0–10.5)
nRBC: 0 % (ref 0.0–0.2)

## 2022-02-06 LAB — MAGNESIUM: Magnesium: 1.8 mg/dL (ref 1.7–2.4)

## 2022-02-06 NOTE — Assessment & Plan Note (Addendum)
--  no need for BG checks and SSI ?

## 2022-02-06 NOTE — Progress Notes (Signed)
Progress Note   Patient: Earl Moreno BOF:[id number] DOB: 08-Aug-1965 DOA: 01/21/2022     15 DOS: the patient was seen and examined on 02/06/2022   Brief hospital course: ICU transfer Taken from prior notes. 56 yo M with history of IVDA, recurrent hospitalization and leaving AMA, presenting to [facility] ED on 01/21/22 via EMS complaining that he does not feel well.   Per chart review patient with multiple admissions for sepsis including pyelonephritis, GN bacteremia & suspected osteomyelitis in his right hip as recently as 01/15/22. There have been multiple attempts to treat with IV antibiotics & perform a TEE and rule out endocarditis but the patient continues to leave AMA.  On arrival to ED patient was febrile with leukocytosis, tachycardic, tachypneic, mild hyponatremia, lactic acidosis and elevated procalcitonin.  COVID-19 and influenza PCR was negative.  UDS positive for cocaine and opioids. CXR 01/22/22: no acute cardiopulmonary abnormalities CT head wo contrast 01/22/22: Focal loss of gray-white matter differentiation within the right parietal cortex which may reflect a subacute cortical infarct, though this is not well characterized on this examination. No associated mass effect or midline shift. Contrast enhanced MRI examination is recommended for further evaluation CT chest/abdomen/pelvis w contrast 01/22/22: No acute or inflammatory process identified in the chest. Satisfactory endotracheal and enteric tubes. Abdomen and pelvis remarkable for:- abnormal but nonspecific small to moderate volume of simple appearing free fluid in the pelvis. Paucity of intra-abdominal fat limits bowel detail, but no bowel obstruction or inflammation is evident.- small hypoenhancing areas in the superior spleen, with underlying borderline splenomegaly. Although nonspecific consider small splenic infarcts or hematogenous infection in this setting.- focal bone erosion of the posteroinferior right pubic ramus since last July.  But no other lytic osseous lesion identified, and no regional inflammation. This would be an unusual site for osteomyelitis. Query a metabolic (such as parathyroid) bone disorder. Metastatic disease also not excluded.  patient remained very combative and agitated despite giving multiple doses of Ativan and Haldol, he was started on Precedex and maxed out without much change, resulted in intubation and mechanical ventilation.  Extubated on 01/25/2022.  Patient was found to have Serratia bacteremia with aortic valve endocarditis, infectious disease was consulted and patient is currently on dual coverage with cefepime and ciprofloxacin. Repeat blood cultures on 01/26/2022 are negative in 24 hours. Patient was also evaluated by [facility] and is not a candidate for surgical intervention.  Psych was also consulted for opioid withdrawal and he was started on methadone on 01/26/2022 at 40 mg daily-dose will be titrated by psych to keep him comfortable so he will not leave another AMA.  Palliative care was also consulted and he was made DNR with current level of care and treat the treatable.  TRH to resume care from 01/27/2022.  9/8: Hemodynamically stable.  Leukocytosis has been resolved.  Slowly decreasing hemoglobin, currently at 7.5, thrombocytopenia with slight improvement to 53.  HIT panel was negative. Anemia panel ordered. Borderline hypoglycemia with CBG in 70s, Semglee was changed to 5 units twice daily, switched Q 4 sliding scale with meal and nighttime coverage.  9/9: Patient was more interactive and alert with a slight decrease in methadone dose.  Repeat blood cultures negative so far. PT/OT recommending SNF Hemoglobin slowly decreasing, at 7.2 today.  Anemia panel with anemia of chronic disease with some iron deficiency.  9/10: Patient becoming more combative and refusing all the p.o. meds.  Received Haldol around 7 AM. Labs with some improvement in hemoglobin to  7.7 and  platelets to 82.  9/11: Patient intermittently becoming agitated requiring Haldol.  9/12: Hemodynamically stable.  UDS was checked at daughter's request as she was blaming brother giving him some drugs, most likely some family dynamics.  UDS only positive for methadone which he is taking it here.  No other concerns. Need to continue taking IV cefepime and Cipro until 03-08-2022. Patient still very somnolent, decreasing the dose of Haldol and stopping Ativan.  Message was also sent to psych to evaluate for appropriate dosage of methadone.  Patient will need prolonged hospitalization to complete IV antibiotic course secondary to his history of IV drug use.  High risk for deterioration and death. 02-10-2023 patient sustained a mechanical fall while trying to get out of bed. He attempted to go the restroom to urinate.   Assessment and Plan: * Septic shock (HCC) Patient initially admitted with severe sepsis and septic shock secondary to Serratia bacteremia and aortic valve endocarditis with his history of IV drug abuse. Multiple recent infections including pyelonephritis and osteomyelitis which were not treated adequately as he was keeps leaving AMA. Patient was intubated and required pressors in the beginning.  Extubated on 01/25/2022 and was weaned off from pressors. Blood pressure still on softer side. Repeat blood cultures on 9/7 remain negative in 24-hour. ID is on board. -Continue with cefepime and ciprofloxacin as recommended by ID for dual coverage -Patient will need long-term antibiotics and prolonged hospitalization.  Bacteremia Continue antibiotic therapy with ciprofloxacin and cefepime.   Aortic valve endocarditis Found to have large aortic valve vegetation with history of Serratia bacteremia and IV drug use. Not a suitable candidate for any surgical intervention according to [facility] cardiothoracic surgery. -Continue with antibiotic  Altered mental status Patient with intermittent  agitation and delirium requiring Haldol. Patient admitted with toxic metabolic encephalopathy secondary to sepsis and illicit drug use.  No most likely with medications.  Patient has been trying to get out of the bed. Has urine collecting device in place.   Continue with as needed haloperidol.  Methadone 20 mg daily As needed trazodone at night. Continue sitter one to one observation for safety.      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  No abscess or overlying ulcer.  Positive blood cultures, most likely blood seeding. -On appropriate antibiotics  Essential hypertension Patient was on amlodipine at home.  Admitted with septic shock requiring pressors. Blood pressure still on softer side-weaned off from pressors.  Continue to hold on antihypertensive medications.   Acute HFrEF (heart failure with reduced ejection fraction) (HCC) EF of 45 to 50% with global hypokinesis.  Decreased from his prior echocardiogram done in July 2023.  Most likely secondary to aortic endocarditis. Clinically appears euvolemic.  No clinical signs of exacerbation.   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 Psych was consulted for concern of opioid withdrawal and he was started on methadone to prevent another AMA.  Patient on methadone 20 mg daily with good toleration.   Type 2 diabetes mellitus (HCC) Mild hypoglycemia with CBG in 70s.  Fasting glucose this am 104, capillary 80, 232 and 166.  Plan to continue insulin sliding scale for glucose cover and monitoring, continue with basal and pre meal insulin therapy.   Normocytic anemia Most likely secondary to bone marrow suppression with sepsis. Anemia panel shows anemia of chronic disease with some iron deficiency. Continue with oral iron  supplementation  Follow up hgb is 7,0, with plt 93  (thrombocytopenia), Follow up cell count in 48 hrs.   Thrombocytopenia (HCC) Patient developed sudden onset thrombocytopenia which nider at platelets of 43, now started improving slowly.  HIT panel was negative. DIC labs were negative. Most likely secondary to bone marrow suppression with sepsis.  No obvious bleeding. -Continue to monitor  Protein-calorie malnutrition, severe Estimated body mass index is 17.68 kg/m as calculated from the following:   Height as of 01/14/22: 5' 8.11" (1.73 m).   Weight as of this encounter: 52.9 kg.   -Dietitian consult        Subjective: Patient is calm and responsive, this am he fell while trying to get out of bed.   Physical Exam: Vitals:   02/06/22 0100 02/06/22 0330 02/06/22 1000 02/06/22 1123  BP: (!) 90/50 (!) 102/58 (!) 91/57 (!) 102/53  Pulse: 72 76 67 73  Resp: 16 18 18 20   Temp:  97.6 F (36.4 C)  97.7 F (36.5 C)  TempSrc:  Oral  Oral  SpO2: 92% 96% 100% 100%  Weight:  46.9 kg     Neurology awake and alert ENT with mild pallor Cardiovascular with S1 and S2 present and rhythmic Respiratory with no rales or wheezing Abdomen with no distention No lower extremity edema  Data Reviewed:    Family Communication: no family at the bedside   Disposition: Status is: Inpatient Remains inpatient appropriate because: IV antibiotic therapy   Planned Discharge Destination: Home     Author: , MD 02/06/2022 11:59 AM  For on call review [email or web address].

## 2022-02-06 NOTE — Assessment & Plan Note (Addendum)
Serratia bacteremia with aortic valve endocarditis ID consulted. Repeat blood culture neg from 01/26/22 --cont meropenem --repeat Echo next week to assess for size of vegetation and then decide on oral Cipro, per ID

## 2022-02-06 NOTE — Assessment & Plan Note (Signed)
Right ischial tuberosity osteomyelitis on MRI which is little unusual site for osteo.  No abscess or overlying ulcer.  Positive blood cultures, most likely blood seeding. -On appropriate antibiotics

## 2022-02-06 NOTE — Assessment & Plan Note (Addendum)
Most likely secondary to bone marrow suppression with sepsis. Anemia panel shows anemia of chronic disease with some iron deficiency. --Continue with oral iron supplementation  Transfused 1 unit RBC's on 9/23 for Hgb is 6.7.  No obvious signs of bleeding. Hbg this AM improved to 8.9 --Trend Hbg, transfuse if < 7.0

## 2022-02-06 NOTE — Assessment & Plan Note (Signed)
Patient was on amlodipine at home.  Admitted with septic shock requiring pressors. Blood pressure still on softer side-weaned off from pressors.  Continue to hold on antihypertensive medications.

## 2022-02-06 NOTE — Assessment & Plan Note (Addendum)
Patient initially admitted with severe sepsis and septic shock secondary to Serratia bacteremia and aortic valve endocarditis with his history of IV drug abuse. Multiple recent infections including pyelonephritis and osteomyelitis which were not treated adequately as he was keeps leaving AMA. Patient was intubated and required pressors in the beginning.  Extubated on 01/25/2022 and was weaned off from pressors. --abx per ID

## 2022-02-06 NOTE — Assessment & Plan Note (Signed)
History of IV opioid use for many years. Psych was consulted for concern of opioid withdrawal and he was started on methadone to prevent another AMA.  Patient on methadone 20 mg daily with good toleration.

## 2022-02-06 NOTE — Assessment & Plan Note (Addendum)
Estimated body mass index is 15.67 kg/m as calculated from the following:   Height as of 01/14/22: 5' 8.11" (1.73 m).   Weight as of this encounter: 46.9 kg.   Continue with nutritional supplements.  Started on tube feeds by NG tube 02/08/22. Patient pulled out NG tube. Eating well, consuming nearly 100% of all meals.   Continue encourage PO intake & hydration. Dietitian following.

## 2022-02-06 NOTE — Progress Notes (Signed)
PT Cancellation Note  Patient Details Name: Earl Moreno MRN: [id number] DOB: 24-Nov-1965   Cancelled Treatment:    Reason Eval/Treat Not Completed: Medical issues which prohibited therapy  Pt with HgB 7.0 and noted recent fall today trying to get up out of bed.  Will defer today and continue tomorrow as appropriate.   Chesley Noon 02/06/2022, 2:00 PM

## 2022-02-06 NOTE — Assessment & Plan Note (Signed)
EF of 45 to 50% with global hypokinesis.  Decreased from his prior echocardiogram done in July 2023.  Most likely secondary to aortic endocarditis. Clinically appears euvolemic.  No clinical signs of exacerbation.

## 2022-02-06 NOTE — Assessment & Plan Note (Addendum)
Patient developed sudden onset thrombocytopenia to nadir of platelets 43k. HIT panel was negative. DIC labs were negative. Most likely secondary to bone marrow suppression with sepsis.  No obvious bleeding. --plt now back to normal

## 2022-02-06 NOTE — Assessment & Plan Note (Addendum)
Initially was admitted with toxic metabolic encephalopathy secondary to sepsis and illicit drug use.    Now mental status is multifactorial but now likely due to brain abscesses and multiple septic emboli. He had been having intermittent agitation and delirium, requiring Haldol.  Sedating meds have since been held and minimized.  --Continue with as needed haloperidol (avoid if possible0.  --Methadone 20 mg daily --As needed trazodone at night.

## 2022-02-06 NOTE — Progress Notes (Signed)
   Date of Admission:  01/21/2022     ID: Earl Moreno is a 56 y.o. male  Principal Problem:   Septic shock ([place]) Active Problems:   Type 2 diabetes mellitus ([place])   Polysubstance (including opioids) dependence with physiol dependence ([place])   Essential hypertension   Protein-calorie malnutrition, severe   Altered mental status   Bacteremia   Aortic valve endocarditis   Acute HFrEF (heart failure with reduced ejection fraction) (HCC)   Osteomyelitis (HCC)   Normocytic anemia   Thrombocytopenia (HCC)    Subjective: Pt was trying to get out of the bed and fell No injuries He has no complaints Sitter at bed side      Medications:   acetaminophen  975 mg Oral Q6H   Chlorhexidine Gluconate Cloth  6 each Topical Daily   enoxaparin (LOVENOX) injection  40 mg Subcutaneous Q24H   feeding supplement  237 mL Oral TID BM   Ferrous Fumarate  1 tablet Oral BID   folic acid  1 mg Oral Daily   insulin aspart  0-15 Units Subcutaneous TID WC   insulin aspart  0-5 Units Subcutaneous QHS   methadone  20 mg Oral Daily   multivitamin with minerals  1 tablet Oral Daily   pantoprazole  40 mg Oral Daily   polyethylene glycol  17 g Oral Daily   senna  1 tablet Oral BID    Objective: Vital signs in last 24 hours:Patient Vitals for the past 24 hrs:  BP Temp Temp src Pulse Resp SpO2 Weight  02/06/22 1123 (!) 102/53 97.7 F (36.5 C) Oral 73 20 100 % --  02/06/22 1000 (!) 91/57 -- -- 67 18 100 % --  02/06/22 0330 (!) 102/58 97.6 F (36.4 C) Oral 76 18 96 % 46.9 kg  02/06/22 0100 (!) 90/50 -- -- 72 16 92 % --  02/05/22 2211 129/73 99.3 F (37.4 C) Axillary 85 16 92 % --  02/05/22 1621 (!) 100/57 98 F (36.7 C) Oral 71 17 97 % --      PHYSICAL EXAM:  General: awake , repsonds to questions after much coaxing Lungs: b/l air entry Heart: s1s2 Abdomen: Soft, non-tender,not distended. Bowel sounds normal. No masses Neurologic:non focal  Lab Results Recent Labs    02/05/22 0630  02/06/22 0718  WBC 12.2* 10.5  HGB 7.1* 7.0*  HCT 22.5* 22.2*  NA 133* 135  K 3.3* 3.7  CL 104 107  CO2 25 23  BUN 10 11  CREATININE 0.58* 0.66     Microbiology: 01/22/22 - serratia 01/26/22 BC neg    Assessment/Plan: Encephalopathy secondary to sepsis and infarct - improved But now it is more influenced by the meds ?   Serratia bacteremia with aortic valve endocarditis On dual coverage with cefepime + cipro Repeat blood culture neg from 01/26/22 Will need for 6 weeks-- 2022-03-21 Watch closely as both can cause encephalopathy  IVDA  heroin user - now on methadone  20mg    -No resp distress   hypokalemia  Anemia   Thrombocytopenia  Osteomyelitis rt pubic ramus  Previous bacteremias including serratia , enterobacter- not treated because of him leaving AMA ?Discussed the management with care team

## 2022-02-06 NOTE — Assessment & Plan Note (Addendum)
Found to have large aortic valve vegetation with history of Serratia bacteremia and IV drug use. Not a suitable candidate for any surgical intervention according to [facility] cardiothoracic surgery. 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  See Dr. Abran Duke note.  Surgery contraindicated due to very high risk of hemorrhagic stroke given MRI findings. --Continue with antibiotics per ID --repeat Echo next week to assess for size of vegetation

## 2022-02-07 ENCOUNTER — Inpatient Hospital Stay: Payer: Medicaid Other

## 2022-02-07 DIAGNOSIS — D696 Thrombocytopenia, unspecified: Secondary | ICD-10-CM

## 2022-02-07 LAB — GLUCOSE, CAPILLARY
Glucose-Capillary: 102 mg/dL — ABNORMAL HIGH (ref 70–99)
Glucose-Capillary: 140 mg/dL — ABNORMAL HIGH (ref 70–99)
Glucose-Capillary: 127 mg/dL — ABNORMAL HIGH (ref 70–99)
Glucose-Capillary: 144 mg/dL — ABNORMAL HIGH (ref 70–99)

## 2022-02-07 MED ORDER — BISACODYL 10 MG RE SUPP: 10.0000 mg | Freq: Every day | RECTAL | Status: DC | PRN

## 2022-02-07 MED ORDER — FLEET ENEMA 7-19 GM/118ML RE ENEM: 1.0000 | ENEMA | Freq: Every day | RECTAL | Status: DC | PRN

## 2022-02-07 MED ORDER — SODIUM CHLORIDE 0.9 % IV SOLN
1.0000 g | Freq: Three times a day (TID) | INTRAVENOUS | Status: DC
  Administered 2022-02-08 – 2022-02-15 (×22): 1 g via INTRAVENOUS
  Filled 2022-02-07 (×2): qty 20
  Filled 2022-02-07 (×3): qty 1
  Filled 2022-02-07 (×2): qty 20
  Filled 2022-02-07 (×2): qty 1
  Filled 2022-02-07 (×2): qty 20
  Filled 2022-02-07 (×2): qty 1
  Filled 2022-02-07: qty 20
  Filled 2022-02-07 (×2): qty 1
  Filled 2022-02-07 (×3): qty 20
  Filled 2022-02-07: qty 1
  Filled 2022-02-07 (×2): qty 20
  Filled 2022-02-07: qty 1
  Filled 2022-02-07: qty 20
  Filled 2022-02-07: qty 1

## 2022-02-07 NOTE — Plan of Care (Signed)
  Problem: Safety: Goal: Non-violent Restraint(s) Outcome: Progressing   Problem: Education: Goal: Knowledge of General Education information will improve Description: Including pain rating scale, medication(s)/side effects and non-pharmacologic comfort measures Outcome: Progressing   Problem: Health Behavior/Discharge Planning: Goal: Ability to manage health-related needs will improve Outcome: Progressing   Problem: Clinical Measurements: Goal: Ability to maintain clinical measurements within normal limits will improve Outcome: Progressing Goal: Will remain free from infection Outcome: Progressing Goal: Diagnostic test results will improve Outcome: Progressing Goal: Respiratory complications will improve Outcome: Progressing Goal: Cardiovascular complication will be avoided Outcome: Progressing   Problem: Activity: Goal: Risk for activity intolerance will decrease Outcome: Progressing   Problem: Nutrition: Goal: Adequate nutrition will be maintained Outcome: Progressing   Problem: Coping: Goal: Level of anxiety will decrease Outcome: Progressing   Problem: Elimination: Goal: Will not experience complications related to bowel motility Outcome: Progressing Goal: Will not experience complications related to urinary retention Outcome: Progressing   Problem: Pain Managment: Goal: General experience of comfort will improve Outcome: Progressing   Problem: Safety: Goal: Ability to remain free from injury will improve Outcome: Progressing   Problem: Skin Integrity: Goal: Risk for impaired skin integrity will decrease Outcome: Progressing   Problem: Education: Goal: Ability to describe self-care measures that may prevent or decrease complications (Diabetes Survival Skills Education) will improve Outcome: Progressing Goal: Individualized Educational Video(s) Outcome: Progressing   Problem: Coping: Goal: Ability to adjust to condition or change in health will  improve Outcome: Progressing   Problem: Fluid Volume: Goal: Ability to maintain a balanced intake and output will improve Outcome: Progressing   Problem: Health Behavior/Discharge Planning: Goal: Ability to identify and utilize available resources and services will improve Outcome: Progressing Goal: Ability to manage health-related needs will improve Outcome: Progressing   Problem: Metabolic: Goal: Ability to maintain appropriate glucose levels will improve Outcome: Progressing   Problem: Nutritional: Goal: Maintenance of adequate nutrition will improve Outcome: Progressing Goal: Progress toward achieving an optimal weight will improve Outcome: Progressing   Problem: Skin Integrity: Goal: Risk for impaired skin integrity will decrease Outcome: Progressing   Problem: Tissue Perfusion: Goal: Adequacy of tissue perfusion will improve Outcome: Progressing   

## 2022-02-07 NOTE — Progress Notes (Signed)
Pt experienced a unwitnessed fall. Fall prevention measures were in place. Shared safety sitter, and primary RN had just laid eyes on patient about 3 minutes before event, pt was resting with eyes closed. Bed alarm goes off, Primary RN, and NT goes running to patient room. Pt has feet planted on floor mat with knees bent (weakness) holding on to side rail that is still up. NT said she heard a 'boom' as we were running to the room, assuming the patient had hit the floor. As staff is trying to put patient back into bed, he's agitated, and reaching for underwear to urinate. Pt placed back in bed, re-oriented, and informed of external urinary pouch. Pt settles down. Pt assessed. VSS. No signs/symptoms of injury. Pt educated of using call bell, and fall prevention/safety measures. [place], NP, and [facility] notified and made aware. Pt sibling, [name] notified and made aware. Son comes to bedside about 30 minutes later, and also discussed with patient to not get out of bed. New orders for CT. Post fall flowsheet, and paperwork completed. Will continue to monitor patient.

## 2022-02-07 NOTE — Progress Notes (Signed)
Progress Note   Patient: Earl Moreno AVW:[id number] DOB: [birth year] DOA: 01/21/2022     16 DOS: the patient was seen and examined on 02/07/2022   Brief hospital course: ICU transfer Taken from prior notes. 56 yo M with history of IVDA, recurrent hospitalization and leaving AMA, presenting to [facility] ED on 01/21/22 via EMS complaining that he does not feel well.   Per chart review patient with multiple admissions for sepsis including pyelonephritis, GN bacteremia & suspected osteomyelitis in his right hip as recently as 01/15/22. There have been multiple attempts to treat with IV antibiotics & perform a TEE and rule out endocarditis but the patient continues to leave AMA.  On arrival to ED patient was febrile with leukocytosis, tachycardic, tachypneic, mild hyponatremia, lactic acidosis and elevated procalcitonin.  COVID-19 and influenza PCR was negative.  UDS positive for cocaine and opioids. CXR 01/22/22: no acute cardiopulmonary abnormalities CT head wo contrast 01/22/22: Focal loss of gray-white matter differentiation within the right parietal cortex which may reflect a subacute cortical infarct, though this is not well characterized on this examination. No associated mass effect or midline shift. Contrast enhanced MRI examination is recommended for further evaluation CT chest/abdomen/pelvis w contrast 01/22/22: No acute or inflammatory process identified in the chest. Satisfactory endotracheal and enteric tubes. Abdomen and pelvis remarkable for:- abnormal but nonspecific small to moderate volume of simple appearing free fluid in the pelvis. Paucity of intra-abdominal fat limits bowel detail, but no bowel obstruction or inflammation is evident.- small hypoenhancing areas in the superior spleen, with underlying borderline splenomegaly. Although nonspecific consider small splenic infarcts or hematogenous infection in this setting.- focal bone erosion of the posteroinferior right pubic ramus since last July.  But no other lytic osseous lesion identified, and no regional inflammation. This would be an unusual site for osteomyelitis. Query a metabolic (such as parathyroid) bone disorder. Metastatic disease also not excluded.  patient remained very combative and agitated despite giving multiple doses of Ativan and Haldol, he was started on Precedex and maxed out without much change, resulted in intubation and mechanical ventilation.  Extubated on 01/25/2022.  Patient was found to have Serratia bacteremia with aortic valve endocarditis, infectious disease was consulted and patient is currently on dual coverage with cefepime and ciprofloxacin. Repeat blood cultures on 01/26/2022 are negative in 24 hours. Patient was also evaluated by [facility] and is not a candidate for surgical intervention.  Psych was also consulted for opioid withdrawal and he was started on methadone on 01/26/2022 at 40 mg daily-dose will be titrated by psych to keep him comfortable so he will not leave another AMA.  Palliative care was also consulted and he was made DNR with current level of care and treat the treatable.  TRH to resume care from 01/27/2022.  9/8: Hemodynamically stable.  Leukocytosis has been resolved.  Slowly decreasing hemoglobin, currently at 7.5, thrombocytopenia with slight improvement to 53.  HIT panel was negative. Anemia panel ordered. Borderline hypoglycemia with CBG in 70s, Semglee was changed to 5 units twice daily, switched Q 4 sliding scale with meal and nighttime coverage.  9/9: Patient was more interactive and alert with a slight decrease in methadone dose.  Repeat blood cultures negative so far. PT/OT recommending SNF Hemoglobin slowly decreasing, at 7.2 today.  Anemia panel with anemia of chronic disease with some iron deficiency.  9/10: Patient becoming more combative and refusing all the p.o. meds.  Received Haldol around 7 AM. Labs with some improvement in hemoglobin to  7.7 and  platelets to 82.  9/11: Patient intermittently becoming agitated requiring Haldol.  9/12: Hemodynamically stable.  UDS was checked at daughter's request as she was blaming brother giving him some drugs, most likely some family dynamics.  UDS only positive for methadone which he is taking it here.  No other concerns. Need to continue taking IV cefepime and Cipro until March 16, 2022. Patient still very somnolent, decreasing the dose of Haldol and stopping Ativan.  Message was also sent to psych to evaluate for appropriate dosage of methadone.  Patient will need prolonged hospitalization to complete IV antibiotic course secondary to his history of IV drug use.  High risk for deterioration and death. 02/18/2023 patient sustained a mechanical fall while trying to get out of bed. He attempted to go the restroom to urinate.   Assessment and Plan: * Septic shock (HCC) Patient initially admitted with severe sepsis and septic shock secondary to Serratia bacteremia and aortic valve endocarditis with his history of IV drug abuse. Multiple recent infections including pyelonephritis and osteomyelitis which were not treated adequately as he was keeps leaving AMA. Patient was intubated and required pressors in the beginning.  Extubated on 01/25/2022 and was weaned off from pressors. Blood pressure still on softer side. Repeat blood cultures on 9/7 remain negative in 24-hour. ID is on board. -Continue with cefepime and ciprofloxacin as recommended by ID for dual coverage -Patient will need long-term antibiotics and prolonged hospitalization.  Bacteremia Continue antibiotic therapy with ciprofloxacin and cefepime.   Aortic valve endocarditis Found to have large aortic valve vegetation with history of Serratia bacteremia and IV drug use. Not a suitable candidate for any surgical intervention according to [facility] cardiothoracic surgery. -Continue with antibiotic  Altered mental status Patient with intermittent  agitation and delirium requiring Haldol. Patient admitted with toxic metabolic encephalopathy secondary to sepsis and illicit drug use.  No most likely with medications.  Patient has been trying to get out of the bed. Has urine collecting device in place.   Continue with as needed haloperidol.  Methadone 20 mg daily As needed trazodone at night. Continue sitter one to one observation for safety.      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  No abscess or overlying ulcer.  Positive blood cultures, most likely blood seeding. -On appropriate antibiotics  Essential hypertension Patient was on amlodipine at home.  Admitted with septic shock requiring pressors. Blood pressure still on softer side-weaned off from pressors.  Continue to hold on antihypertensive medications.   Acute HFrEF (heart failure with reduced ejection fraction) (HCC) EF of 45 to 50% with global hypokinesis.  Decreased from his prior echocardiogram done in July 2023.  Most likely secondary to aortic endocarditis. Clinically appears euvolemic.  No clinical signs of exacerbation.   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 Psych was consulted for concern of opioid withdrawal and he was started on methadone to prevent another AMA.  Patient on methadone 20 mg daily with good toleration.   Type 2 diabetes mellitus (HCC) Mild hypoglycemia with CBG in 70s.  Fasting glucose this am 104, capillary 80, 232 and 166.  Plan to continue insulin sliding scale for glucose cover and monitoring, continue with basal and pre meal insulin therapy.   Normocytic anemia Most likely secondary to bone marrow suppression with sepsis. Anemia panel shows anemia of chronic disease with some iron deficiency. Continue with oral iron  supplementation  Follow up hgb is 7,0, with plt 93  (thrombocytopenia), Follow up cell count in 48 hrs.   Thrombocytopenia ([place]) Patient developed sudden onset thrombocytopenia which nider at platelets of 43, now started improving slowly.  HIT panel was negative. DIC labs were negative. Most likely secondary to bone marrow suppression with sepsis.  No obvious bleeding. -Continue to monitor  Protein-calorie malnutrition, severe Estimated body mass index is 15.67 kg/m as calculated from the following:   Height as of 01/14/22: 5' 8.11" (1.73 m).   Weight as of this encounter: 46.9 kg.   Continue with nutritional supplements.         Subjective: Patient seen with son at bedside today.  Pt somnolent, but does wake up.  He gets agitated when he awakens and does not want to be examined or talked to.  He had a fall trying to get out of bed, CT head negative for any bleed or injury.    Physical Exam: Vitals:   02/07/22 0500 02/07/22 0749 02/07/22 1138 02/07/22 1526  BP: (!) 112/55 (!) 118/58 (!) 117/52 (!) 129/57  Pulse: 72 83  74  Resp: 18 18 20 20   Temp: 97.8 F (36.6 C) 97.9 F (36.6 C) 97.9 F (36.6 C) 98.7 F (37.1 C)  TempSrc: Axillary Axillary Axillary Axillary  SpO2: 94% 100% 100% 100%  Weight:       General exam: sleeping but arousable, no acute distress HEENT: moist mucus membranes, hearing grossly normal  Respiratory system: CTAB, no wheezes, rales or rhonchi, normal respiratory effort. Cardiovascular system: normal S1/S2, RRR, no pedal edema.   Gastrointestinal system: sunken abdomen, non-tender Central nervous system: unable to evaluate due to somnolence and agitation when he arouses, does not follow commands Extremities: mittens on both hands, no edema, normal tone Skin: dry, intact, normal temperature Psychiatry: normal mood, congruent affect, judgement and insight appear normal    Data Reviewed: Notable labs --- CBG's 102 >> 140 >> 127  CT head w/o contrast -- negative for acute intracranial  findings. IMPRESSION: 1. 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 No associated hemorrhage or significant regional mass effect. 2. No other new acute intracranial abnormality. No evidence for traumatic injury status post fall.   Family Communication: son at the bedside on rounds today 9/19  Disposition: Status is: Inpatient Remains inpatient appropriate because: IV antibiotic therapy    Planned Discharge Destination: Home     Author: Ezekiel Slocumb, DO 02/07/2022 6:44 PM  For on call review [email or web address].

## 2022-02-07 NOTE — Progress Notes (Signed)
   Date of Admission:  01/21/2022     ID: Earl Moreno is a 56 y.o. male  Principal Problem:   Septic shock ([place]) Active Problems:   Type 2 diabetes mellitus ([name])   Polysubstance (including opioids) dependence with physiol dependence ([name])   Essential hypertension   Protein-calorie malnutrition, severe   Altered mental status   Bacteremia   Aortic valve endocarditis   Acute HFrEF (heart failure with reduced ejection fraction) (HCC)   Osteomyelitis (HCC)   Normocytic anemia   Thrombocytopenia (HCC)    Subjective: pt is non verbal Agitated at times Not eating       Medications:   acetaminophen  975 mg Oral Q6H   Chlorhexidine Gluconate Cloth  6 each Topical Daily   enoxaparin (LOVENOX) injection  40 mg Subcutaneous Q24H   feeding supplement  237 mL Oral TID BM   Ferrous Fumarate  1 tablet Oral BID   folic acid  1 mg Oral Daily   insulin aspart  0-15 Units Subcutaneous TID WC   insulin aspart  0-5 Units Subcutaneous QHS   methadone  20 mg Oral Daily   multivitamin with minerals  1 tablet Oral Daily   pantoprazole  40 mg Oral Daily   polyethylene glycol  17 g Oral Daily   senna  1 tablet Oral BID    Objective: Vital signs in last 24 hours:Patient Vitals for the past 24 hrs:  BP Temp Temp src Pulse Resp SpO2  02/07/22 1138 (!) 117/52 97.9 F (36.6 C) Axillary -- 20 100 %  02/07/22 0749 (!) 118/58 97.9 F (36.6 C) Axillary 83 18 100 %  02/07/22 0500 (!) 112/55 97.8 F (36.6 C) Axillary 72 18 94 %  02/06/22 2209 130/62 -- Other 76 16 93 %  02/06/22 1948 (!) 100/58 98.3 F (36.8 C) Oral 67 16 97 %  02/06/22 1553 (!) 97/53 97.6 F (36.4 C) Axillary 62 18 97 %      PHYSICAL EXAM:  General: lethargic, non verbal tries to drink some ensure Lungs: b/l air entry Heart: s1s2 Abdomen: Soft, non-tender,not distended. Bowel sounds normal. No masses Neurologic:non focal  Lab Results Recent Labs    02/05/22 0630 02/06/22 0718  WBC 12.2* 10.5  HGB 7.1* 7.0*   HCT 22.5* 22.2*  NA 133* 135  K 3.3* 3.7  CL 104 107  CO2 25 23  BUN 10 11  CREATININE 0.58* 0.66     Microbiology: 01/22/22 - serratia 01/26/22 BC neg    Assessment/Plan: Encephalopathy secondary to sepsis and infarct - improved But now it is more influenced by the meds Still has delirium  ?   Serratia bacteremia with aortic valve endocarditis On dual coverage with cefepime + cipro Repeat blood culture neg from 01/26/22 Will need for 6 weeks-- 03-24-2022 Will DC cipro and cefepime and change to meropenem to see whether his delirium will improve Osteomyelitis rt pubic ramus  IVDA  heroin user - now on methadone  20mg - may need to reduce this even further   -No resp distress    Anemia   Thrombocytopenia    Previous bacteremias including serratia , enterobacter- not treated because of him leaving AMA ?Discussed the management with care team

## 2022-02-07 NOTE — Progress Notes (Signed)
Physical Therapy Treatment Patient Details Name: Earl Moreno MRN: [id number] DOB: Apr 25, 1966 Today's Date: 02/07/2022   History of Present Illness Pt is a 56 y.o. male  with past medical history of multiple admissions for sepsis including pyelonephritis, GN bacteremia and suspected osteomyelitis in his right hip as recently as 01/15/2022 with multiple attempts to treat with IV antibiotics but patient continues to leave AMA, history of heroin abuse, DM 2, splenic infarct, admitted on 01/21/2022 with septic shock initially requiring intubation/ventilation.    PT Comments    Pt seen this am for brief modified session after attempting to encourage mobility. Pt appears much more alert than previous session, however remains cognitively impaired with following commands, agitation, and appropriateness. Pt able to attain sitting at EOB with poor posture and tolerance to maintain due to lack of comprehension and cognitive impairment. Will continue to progress as able. Pt's son was present and very helpful during session.    Recommendations for follow up therapy are one component of a multi-disciplinary discharge planning process, led by the attending physician.  Recommendations may be updated based on patient status, additional functional criteria and insurance authorization.  Follow Up Recommendations  Skilled nursing-short term rehab (<3 hours/day) Can patient physically be transported by private vehicle: No   Assistance Recommended at Discharge Frequent or constant Supervision/Assistance  Patient can return home with the following Two people to help with walking and/or transfers;Two people to help with bathing/dressing/bathroom;Help with stairs or ramp for entrance;Direct supervision/assist for medications management;Assist for transportation;Assistance with cooking/housework;Direct supervision/assist for financial management   Equipment Recommendations  Other (comment) (to be determined at next  venue)    Recommendations for Other Services       Precautions / Restrictions Precautions Precautions: Fall     Mobility  Bed Mobility Overal bed mobility: Needs Assistance Bed Mobility: Supine to Sit, Sit to Supine     Supine to sit: Total assist Sit to supine: Total assist   General bed mobility comments: Pt does not follow commands and does not initiate any movement towards EOB    Transfers                   General transfer comment: unable to attempt secondary to safety concerns    Ambulation/Gait               General Gait Details: Pt unable to tolerate gait training at this time   Stairs             Wheelchair Mobility    Modified Rankin (Stroke Patients Only)       Balance           Standing balance support: Bilateral upper extremity supported Standing balance-Leahy Scale: Poor Standing balance comment: external support required                            Cognition Arousal/Alertness: Awake/alert Behavior During Therapy: Agitated, Restless Overall Cognitive Status: Impaired/Different from baseline Area of Impairment: Memory, Following commands, Safety/judgement, Awareness, Problem solving                 Orientation Level: Person Current Attention Level: Focused Memory: Decreased recall of precautions, Decreased short-term memory Following Commands: Follows one step commands inconsistently Safety/Judgement: Decreased awareness of safety, Decreased awareness of deficits Awareness: Intellectual Problem Solving: Slow processing, Decreased initiation, Difficulty sequencing, Requires verbal cues, Requires tactile cues General Comments: Pt is unable to follow any command this session  Exercises      General Comments General comments (skin integrity, edema, etc.): Pt's son present to help with managing pt's cognitive deficits and behaviors      Pertinent Vitals/Pain Pain Assessment Pain  Assessment: No/denies pain    Home Living                          Prior Function            PT Goals (current goals can now be found in the care plan section)      Frequency    Min 2X/week      PT Plan      Co-evaluation              AM-PAC PT "6 Clicks" Mobility   Outcome Measure  Help needed turning from your back to your side while in a flat bed without using bedrails?: A Lot Help needed moving from lying on your back to sitting on the side of a flat bed without using bedrails?: Total Help needed moving to and from a bed to a chair (including a wheelchair)?: Total Help needed standing up from a chair using your arms (e.g., wheelchair or bedside chair)?: Total Help needed to walk in hospital room?: Total Help needed climbing 3-5 steps with a railing? : Total 6 Click Score: 7    End of Session   Activity Tolerance: Treatment limited secondary to agitation Patient left: in bed;with call bell/phone within reach;with bed alarm set Nurse Communication: Mobility status;Other (comment) (Pt's behavior during session) PT Visit Diagnosis: Muscle weakness (generalized) (M62.81);Difficulty in walking, not elsewhere classified (R26.2);Unsteadiness on feet (R26.81)     Time: 1041-1050 PT Time Calculation (min) (ACUTE ONLY): 9 min  Charges:  $Therapeutic Activity: 8-22 mins                    Mikel Cella, PTA    Earl Moreno 02/07/2022, 1:23 PM

## 2022-02-07 NOTE — Progress Notes (Signed)
       CROSS COVER NOTE  NAME: Earl Moreno MRN: [id number] DOB : 1965-08-09   Notified by nursing that patient has fallen out of bed.  No loss of consciousness or change in sensorium that precipitated the fall.  Patient is not complaining of any pain and there is no visible sign of injury on assessment. The fall was unwitnessed and nursing reports [name] denies hitting his head. Patient is not a reliable historian.  CT Head ordered and negative. We will continue to monitor and consider additional testing if patient begins to exhibit any symptoms of  pain or change from neurological baseline.   This document was prepared using Dragon voice recognition software and may include unintentional dictation errors.  Neomia Glass DNP, MHA, FNP-BC Nurse Practitioner [facility] Pager [phone]

## 2022-02-07 NOTE — Progress Notes (Signed)
Occupational Therapy Treatment Patient Details Name: Earl Moreno MRN: [id number] DOB: Dec 20, 1965 Today's Date: 02/07/2022   History of present illness Pt is a 56 y.o. male  with past medical history of multiple admissions for sepsis including pyelonephritis, GN bacteremia and suspected osteomyelitis in his right hip as recently as 01/15/2022 with multiple attempts to treat with IV antibiotics but patient continues to leave AMA, history of heroin abuse, DM 2, splenic infarct, admitted on 01/21/2022 with septic shock initially requiring intubation/ventilation.   OT comments  Upon entering the room, pt supine in bed with sitter present and son in room. Son is very helpful in attempting to get his father to allow care and encourage. Pt is alert this session but is restless and agitated. B hand mitts on during session. He is unable to follow any commands during session. Total A for bed mobility and unable to advance to any functional tasks. Pt returning to supine. OT attempts oral care and put grabs suction toothbrush and bends it in half. He will not allow oral care this session even with son attempting to assist. Pt making obscene gestures with tongue and calling women in the room "b*tches". Pt has not been showing rehab potential and remains unable to follow any commands during session. OT to re-assess next session. If pt continues to not make progress he will be discharged from acute OT.    Recommendations for follow up therapy are one component of a multi-disciplinary discharge planning process, led by the attending physician.  Recommendations may be updated based on patient status, additional functional criteria and insurance authorization.    Follow Up Recommendations  Skilled nursing-short term rehab (<3 hours/day)    Assistance Recommended at Discharge Frequent or constant Supervision/Assistance  Patient can return home with the following  Assistance with cooking/housework;Direct  supervision/assist for medications management;Direct supervision/assist for financial management;Assist for transportation;Two people to help with walking and/or transfers;Two people to help with bathing/dressing/bathroom   Equipment Recommendations  Other (comment) (defer to next venue of care)       Precautions / Restrictions Precautions Precautions: Fall       Mobility Bed Mobility Overal bed mobility: Needs Assistance Bed Mobility: Supine to Sit, Sit to Supine     Supine to sit: Total assist Sit to supine: Total assist   General bed mobility comments: Pt does not follow commands and does not initiate any movement towards EOB    Transfers                   General transfer comment: unable to attempt secondary to safety concerns     Balance Overall balance assessment: Needs assistance Sitting-balance support: Feet supported Sitting balance-Leahy Scale: Fair                                     ADL either performed or assessed with clinical judgement   ADL                                         General ADL Comments: total A attempts at oral care with pt becoming agitated      Cognition Arousal/Alertness: Awake/alert Behavior During Therapy: Agitated, Restless Overall Cognitive Status: Impaired/Different from baseline Area of Impairment: Memory, Following commands, Safety/judgement, Awareness, Problem solving  Orientation Level: Person Current Attention Level: Focused Memory: Decreased recall of precautions, Decreased short-term memory Following Commands:  (unable to follow commands) Safety/Judgement: Decreased awareness of safety, Decreased awareness of deficits Awareness: Intellectual Problem Solving: Slow processing, Decreased initiation, Difficulty sequencing, Requires verbal cues, Requires tactile cues General Comments: Pt is unable to follow any command this session                    Pertinent Vitals/ Pain       Pain Assessment Pain Assessment: Faces Faces Pain Scale: No hurt         Frequency  Min 2X/week        Progress Toward Goals  OT Goals(current goals can now be found in the care plan section)  Progress towards OT goals: Not progressing toward goals - comment;OT to reassess next treatment  Acute Rehab OT Goals OT Goal Formulation: Patient unable to participate in goal setting  Plan Frequency remains appropriate;Discharge plan remains appropriate       AM-PAC OT "6 Clicks" Daily Activity     Outcome Measure   Help from another person eating meals?: A Lot Help from another person taking care of personal grooming?: A Lot Help from another person toileting, which includes using toliet, bedpan, or urinal?: Total Help from another person bathing (including washing, rinsing, drying)?: Total Help from another person to put on and taking off regular upper body clothing?: A Lot Help from another person to put on and taking off regular lower body clothing?: Total 6 Click Score: 9    End of Session    OT Visit Diagnosis: Unsteadiness on feet (R26.81);Muscle weakness (generalized) (M62.81)   Activity Tolerance Treatment limited secondary to agitation   Patient Left in bed;with call bell/phone within reach;with family/visitor present;with nursing/sitter in room;with bed alarm set   Nurse Communication Mobility status        Time: 4270-6237 OT Time Calculation (min): 8 min  Charges: OT General Charges $OT Visit: 1 Visit OT Treatments $Self Care/Home Management : 8-22 mins  Jackquline Denmark, MS, OTR/L , CBIS ascom [phone]  02/07/22, 1:05 PM

## 2022-02-08 ENCOUNTER — Inpatient Hospital Stay (HOSPITAL_COMMUNITY)
Admit: 2022-02-08 | Discharge: 2022-02-08 | Disposition: A | Discharge status: Home / Self Care | Payer: Medicaid Other | Attending: Internal Medicine | Admitting: Internal Medicine

## 2022-02-08 ENCOUNTER — Inpatient Hospital Stay: Payer: Medicaid Other

## 2022-02-08 DIAGNOSIS — G060 Intracranial abscess and granuloma: Secondary | ICD-10-CM

## 2022-02-08 DIAGNOSIS — I639 Cerebral infarction, unspecified: Secondary | ICD-10-CM | POA: Diagnosis present

## 2022-02-08 DIAGNOSIS — I38 Endocarditis, valve unspecified: Secondary | ICD-10-CM

## 2022-02-08 LAB — HEPATITIS PANEL, ACUTE
HCV Ab: REACTIVE — AB
Hep A IgM: NONREACTIVE
Hep B C IgM: NONREACTIVE
Hepatitis B Surface Ag: NONREACTIVE

## 2022-02-08 LAB — ECHOCARDIOGRAM COMPLETE
AR max vel: 1.9 cm2
AV Area VTI: 1.95 cm2
AV Area mean vel: 1.85 cm2
AV Mean grad: 6 mmHg
AV Peak grad: 12 mmHg
Ao pk vel: 1.73 m/s
Area-P 1/2: 3.12 cm2
P 1/2 time: 233 msec
S' Lateral: 3.3 cm
Weight: 1664.91 oz

## 2022-02-08 LAB — RAPID HIV SCREEN (HIV 1/2 AB+AG)
HIV 1/2 Antibodies: NONREACTIVE
HIV-1 P24 Antigen - HIV24: NONREACTIVE

## 2022-02-08 LAB — GLUCOSE, CAPILLARY
Glucose-Capillary: 108 mg/dL — ABNORMAL HIGH (ref 70–99)
Glucose-Capillary: 99 mg/dL (ref 70–99)
Glucose-Capillary: 96 mg/dL (ref 70–99)
Glucose-Capillary: 88 mg/dL (ref 70–99)

## 2022-02-08 LAB — PHOSPHORUS: Phosphorus: 2.8 mg/dL (ref 2.5–4.6)

## 2022-02-08 LAB — MAGNESIUM: Magnesium: 1.8 mg/dL (ref 1.7–2.4)

## 2022-02-08 MED ORDER — VITAL HIGH PROTEIN PO LIQD: 1000.0000 mL | ORAL | Status: DC

## 2022-02-08 MED ORDER — PANTOPRAZOLE 2 MG/ML SUSPENSION
40.0000 mg | Freq: Every day | ORAL | Status: DC
  Administered 2022-02-09 – 2022-02-11 (×2): 40 mg
  Filled 2022-02-08 (×3): qty 20

## 2022-02-08 MED ORDER — ADULT MULTIVITAMIN W/MINERALS CH
1.0000 | ORAL_TABLET | Freq: Every day | ORAL | Status: DC
  Administered 2022-02-09 – 2022-02-11 (×3): 1
  Filled 2022-02-08 (×4): qty 1

## 2022-02-08 MED ORDER — GADOPICLENOL 0.5 MMOL/ML IV SOLN
4.0000 mL | Freq: Once | INTRAVENOUS | Status: AC | PRN
  Administered 2022-02-08: 4 mL via INTRAVENOUS

## 2022-02-08 MED ORDER — POLYETHYLENE GLYCOL 3350 17 G PO PACK
17.0000 g | PACK | Freq: Every day | ORAL | Status: DC
  Administered 2022-02-09 – 2022-02-24 (×8): 17 g
  Filled 2022-02-08 (×9): qty 1

## 2022-02-08 MED ORDER — SENNA 8.6 MG PO TABS
1.0000 | ORAL_TABLET | Freq: Two times a day (BID) | ORAL | Status: DC
  Administered 2022-02-08 – 2022-02-11 (×5): 8.6 mg
  Filled 2022-02-08 (×5): qty 1

## 2022-02-08 MED ORDER — LORAZEPAM 2 MG/ML IJ SOLN
1.0000 mg | Freq: Once | INTRAMUSCULAR | Status: AC | PRN
  Administered 2022-02-08: 1 mg via INTRAVENOUS
  Filled 2022-02-08: qty 1

## 2022-02-08 MED ORDER — ACETAMINOPHEN 325 MG PO TABS
975.0000 mg | ORAL_TABLET | Freq: Four times a day (QID) | ORAL | Status: DC
  Administered 2022-02-08 – 2022-02-11 (×10): 975 mg
  Filled 2022-02-08 (×11): qty 3

## 2022-02-08 MED ORDER — METHADONE HCL 10 MG PO TABS
20.0000 mg | ORAL_TABLET | Freq: Every day | ORAL | Status: DC
  Administered 2022-02-09 – 2022-02-11 (×3): 20 mg
  Filled 2022-02-08 (×3): qty 2

## 2022-02-08 MED ORDER — PROSOURCE TF20 ENFIT COMPATIBL EN LIQD
60.0000 mL | Freq: Two times a day (BID) | ENTERAL | Status: DC
  Administered 2022-02-08 – 2022-02-10 (×4): 60 mL
  Filled 2022-02-08 (×6): qty 60

## 2022-02-08 MED ORDER — DOCUSATE SODIUM 50 MG/5ML PO LIQD: 100.0000 mg | Freq: Two times a day (BID) | ORAL | Status: DC | PRN

## 2022-02-08 MED ORDER — FOLIC ACID 1 MG PO TABS
1.0000 mg | ORAL_TABLET | Freq: Every day | ORAL | Status: DC
  Administered 2022-02-09 – 2022-02-11 (×3): 1 mg
  Filled 2022-02-08 (×4): qty 1

## 2022-02-08 MED ORDER — OSMOLITE 1.5 CAL PO LIQD
1000.0000 mL | ORAL | Status: DC
  Administered 2022-02-08 – 2022-02-10 (×2): 1000 mL

## 2022-02-08 MED ORDER — TRAZODONE HCL 50 MG PO TABS
50.0000 mg | ORAL_TABLET | Freq: Every evening | ORAL | Status: DC | PRN
  Administered 2022-02-10: 50 mg
  Filled 2022-02-08: qty 1

## 2022-02-08 NOTE — H&P (Signed)
     [address]       [place] [address]             [phone]       Case reviewed and discussed with Dr. Arbutus Ped.  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  Additionally, patient has new finding of embolic strokes, with evidence of hemorrhage in multiple areas.    Unfortunately, sAVR would likely lead to progression of his strokes, and thus contraindicated.  He would need meaningful neuro-recovery from these emboli.  Given the severity of his endocarditis, palliative care should be considered  Lajuana Matte

## 2022-02-08 NOTE — Progress Notes (Signed)
*  PRELIMINARY RESULTS* Echocardiogram 2D Echocardiogram has been performed.  Earl Moreno 02/08/2022, 3:23 PM

## 2022-02-08 NOTE — Progress Notes (Signed)
OT Cancellation Note  Patient Details Name: Earl Moreno MRN: [id number] DOB: [birth year]   Cancelled Treatment:    Reason Eval/Treat Not Completed: Patient at procedure or test/ unavailable. Pt currently off the floor for MRI. OT to re-attempt at next available time.   Darleen Crocker, MS, OTR/L , CBIS ascom [phone]  02/08/22, 10:57 AM

## 2022-02-08 NOTE — Plan of Care (Signed)
  Problem: Safety: Goal: Non-violent Restraint(s) Outcome: Progressing   Problem: Education: Goal: Knowledge of General Education information will improve Description: Including pain rating scale, medication(s)/side effects and non-pharmacologic comfort measures Outcome: Progressing   Problem: Health Behavior/Discharge Planning: Goal: Ability to manage health-related needs will improve Outcome: Progressing   Problem: Clinical Measurements: Goal: Ability to maintain clinical measurements within normal limits will improve Outcome: Progressing Goal: Will remain free from infection Outcome: Progressing Goal: Diagnostic test results will improve Outcome: Progressing Goal: Respiratory complications will improve Outcome: Progressing Goal: Cardiovascular complication will be avoided Outcome: Progressing   Problem: Activity: Goal: Risk for activity intolerance will decrease Outcome: Progressing   Problem: Nutrition: Goal: Adequate nutrition will be maintained Outcome: Progressing   Problem: Coping: Goal: Level of anxiety will decrease Outcome: Progressing   Problem: Elimination: Goal: Will not experience complications related to bowel motility Outcome: Progressing Goal: Will not experience complications related to urinary retention Outcome: Progressing   Problem: Pain Managment: Goal: General experience of comfort will improve Outcome: Progressing   Problem: Safety: Goal: Ability to remain free from injury will improve Outcome: Progressing   Problem: Skin Integrity: Goal: Risk for impaired skin integrity will decrease Outcome: Progressing   Problem: Education: Goal: Ability to describe self-care measures that may prevent or decrease complications (Diabetes Survival Skills Education) will improve Outcome: Progressing Goal: Individualized Educational Video(s) Outcome: Progressing   Problem: Coping: Goal: Ability to adjust to condition or change in health will  improve Outcome: Progressing   Problem: Fluid Volume: Goal: Ability to maintain a balanced intake and output will improve Outcome: Progressing   Problem: Health Behavior/Discharge Planning: Goal: Ability to identify and utilize available resources and services will improve Outcome: Progressing Goal: Ability to manage health-related needs will improve Outcome: Progressing   Problem: Metabolic: Goal: Ability to maintain appropriate glucose levels will improve Outcome: Progressing   Problem: Nutritional: Goal: Maintenance of adequate nutrition will improve Outcome: Progressing Goal: Progress toward achieving an optimal weight will improve Outcome: Progressing   Problem: Skin Integrity: Goal: Risk for impaired skin integrity will decrease Outcome: Progressing   Problem: Tissue Perfusion: Goal: Adequacy of tissue perfusion will improve Outcome: Progressing   

## 2022-02-08 NOTE — Assessment & Plan Note (Addendum)
Due to septic emboli. MRI brain on 01/22/22 showed Patchy small volume areas of diffusion signal abnormality with associated postcontrast enhancement involving the right greater than left parieto-occipital regions. Findings felt to be most consistent with evolving subacute ischemic infarcts. Associated mild petechial hemorrhage without significant regional mass effect.  --9/20 - Ongoing encephalopathy out of proportion to sedating meds used for agitation.  Repeat MRI brain ordered -- showed multiple brain abscesses and new septic emboli, likely causing infarcts --Repeat MRI 9/26 showed new foci of infarcts --Pt is at high risk for suffering life-threatening and debilitating infarcts if a large piece of septic emboli causes a large area of infarct.

## 2022-02-08 NOTE — Progress Notes (Signed)
Progress Note   Patient: Earl Moreno ASN:[id number] DOB: 05/01/1966 DOA: 01/21/2022     17 DOS: the patient was seen and examined on 02/08/2022   Brief hospital course: ICU transfer Taken from prior notes. 56 yo M with history of IVDA, recurrent hospitalization and leaving AMA, presenting to [facility] ED on 01/21/22 via EMS complaining that he does not feel well.   Per chart review patient with multiple admissions for sepsis including pyelonephritis, GN bacteremia & suspected osteomyelitis in his right hip as recently as 01/15/22. There have been multiple attempts to treat with IV antibiotics & perform a TEE and rule out endocarditis but the patient continues to leave AMA.  On arrival to ED patient was febrile with leukocytosis, tachycardic, tachypneic, mild hyponatremia, lactic acidosis and elevated procalcitonin.  COVID-19 and influenza PCR was negative.  UDS positive for cocaine and opioids. CXR 01/22/22: no acute cardiopulmonary abnormalities CT head wo contrast 01/22/22: Focal loss of gray-white matter differentiation within the right parietal cortex which may reflect a subacute cortical infarct, though this is not well characterized on this examination. No associated mass effect or midline shift. Contrast enhanced MRI examination is recommended for further evaluation CT chest/abdomen/pelvis w contrast 01/22/22: No acute or inflammatory process identified in the chest. Satisfactory endotracheal and enteric tubes. Abdomen and pelvis remarkable for:- abnormal but nonspecific small to moderate volume of simple appearing free fluid in the pelvis. Paucity of intra-abdominal fat limits bowel detail, but no bowel obstruction or inflammation is evident.- small hypoenhancing areas in the superior spleen, with underlying borderline splenomegaly. Although nonspecific consider small splenic infarcts or hematogenous infection in this setting.- focal bone erosion of the posteroinferior right pubic ramus since last July.  But no other lytic osseous lesion identified, and no regional inflammation. This would be an unusual site for osteomyelitis. Query a metabolic (such as parathyroid) bone disorder. Metastatic disease also not excluded.  patient remained very combative and agitated despite giving multiple doses of Ativan and Haldol, he was started on Precedex and maxed out without much change, resulted in intubation and mechanical ventilation.  Extubated on 01/25/2022.  Patient was found to have Serratia bacteremia with aortic valve endocarditis, infectious disease was consulted and patient is currently on dual coverage with cefepime and ciprofloxacin. Repeat blood cultures on 01/26/2022 are negative in 24 hours. Patient was also evaluated by [facility] cardiothor[facility] and is not a candidate for surgical intervention.  Psych was also consulted for opioid withdrawal and he was started on methadone on 01/26/2022 at 40 mg daily-dose will be titrated by psych to keep him comfortable so he will not leave another AMA.  Palliative care was also consulted and he was made DNR with current level of care and treat the treatable.  TRH to resume care from 01/27/2022.  9/8: Hemodynamically stable.  Leukocytosis has been resolved.  Slowly decreasing hemoglobin, currently at 7.5, thrombocytopenia with slight improvement to 53.  HIT panel was negative. Anemia panel ordered. Borderline hypoglycemia with CBG in 70s, Semglee was changed to 5 units twice daily, switched Q 4 sliding scale with meal and nighttime coverage.  9/9: Patient was more interactive and alert with a slight decrease in methadone dose.  Repeat blood cultures negative so far. PT/OT recommending SNF Hemoglobin slowly decreasing, at 7.2 today.  Anemia panel with anemia of chronic disease with some iron deficiency.  9/10: Patient becoming more combative and refusing all the p.o. meds.  Received Haldol around 7 AM. Labs with some improvement in hemoglobin to  7.7 and  platelets to 82.  9/11: Patient intermittently becoming agitated requiring Haldol.  9/12: Hemodynamically stable.  UDS was checked at daughter's request as she was blaming brother giving him some drugs, most likely some family dynamics.  UDS only positive for methadone which he is taking it here.  No other concerns. Patient still very somnolent, decreasing the dose of Haldol and stopping Ativan.  Message was also sent to psych to evaluate for appropriate dosage of methadone.  Patient will need prolonged hospitalization to complete IV antibiotic course (03/08/22) secondary to his history of IV drug use.  9/18 patient sustained a mechanical fall while trying to get out of bed. He attempted to go the restroom to urinate.   9/20 - repeated MRI brain given his persistent encephalopathy and lethargy, as initial showed subacute infarcts.  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  Reaching back out to CT surgery to re-consider intervention.  Remains very high risk for deterioration and death.   Assessment and Plan: * Septic shock ([place]) Patient initially admitted with severe sepsis and septic shock secondary to Serratia bacteremia and aortic valve endocarditis with his history of IV drug abuse. Multiple recent infections including pyelonephritis and osteomyelitis which were not treated adequately as he was keeps leaving AMA. Patient was intubated and required pressors in the beginning.  Extubated on 01/25/2022 and was weaned off from pressors. Blood pressure still on softer side. Repeat blood cultures on 9/7 remain negative in 24-hour. ID is on board. -Continue antibiotics per ID -On meropenem (previously cefepime, cipro) -Patient will need long-term antibiotics and prolonged  hospitalization.  Bacteremia Continue antibiotic therapy per ID. Was on Cefepime, Cipro. Now on Merrem.  Aortic valve endocarditis Found to have large aortic valve vegetation with history of Serratia bacteremia and IV drug use. Not a suitable candidate for any surgical intervention according to [facility] cardiothoracic surgery. 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    Now mental status is multifactorial but now likely due to brain abscesses and multiple septic emboli. He had been having intermittent agitation and delirium, requiring Haldol.  Sedating meds have since been held and minimized.  --Continue with as needed haloperidol (avoid if possible0.  --Methadone 20 mg daily --As needed trazodone at night. --Continue sitter one to one observation for safety as needed.      Osteomyelitis ([place]) Right ischial tuberosity osteomyelitis on MRI which is little unusual site for osteo.  No abscess or overlying ulcer.  Positive blood cultures, most likely blood seeding. -On appropriate antibiotics  Essential hypertension Patient was on amlodipine at home.  Admitted with septic shock requiring pressors. Blood pressure still on softer side-weaned off from pressors.  Continue to hold on antihypertensive medications.   Acute HFrEF (heart failure with reduced ejection fraction) (HCC) EF of 45 to 50% with global hypokinesis.  Decreased from his prior echocardiogram done in July 2023.  Most likely secondary to aortic endocarditis. Clinically appears euvolemic.  No clinical signs of exacerbation.   Polysubstance (including opioids) dependence with physiol dependence ([place]) History of IV opioid use for many years. Psych was consulted for concern of  opioid  withdrawal and he was started on methadone to prevent another AMA.  Patient on methadone 20 mg daily with good toleration.   Type 2 diabetes mellitus (HCC) Mild hypoglycemia with CBG in 70s.  Fasting glucose this am 104, capillary 80, 232 and 166.  Plan to continue insulin sliding scale for glucose cover and monitoring, continue with basal and pre meal insulin therapy.   Normocytic anemia Most likely secondary to bone marrow suppression with sepsis. Anemia panel shows anemia of chronic disease with some iron deficiency. Continue with oral iron supplementation  Follow up hgb is 7,0, with plt 93 (thrombocytopenia), Follow up cell count in 48 hrs.   Thrombocytopenia (HCC) Patient developed sudden onset thrombocytopenia which nider at platelets of 43, now started improving slowly.  HIT panel was negative. DIC labs were negative. Most likely secondary to bone marrow suppression with sepsis.  No obvious bleeding. -Continue to monitor  Protein-calorie malnutrition, severe Estimated body mass index is 15.67 kg/m as calculated from the following:   Height as of 01/14/22: 5' 8.11" (1.73 m).   Weight as of this encounter: 46.9 kg.   Continue with nutritional supplements.   Ischemic cerebrovascular accident (CVA) (HCC) Due to septic emboli. MRI brain on 01/22/22 showed Patchy small volume areas of diffusion signal abnormality with associated postcontrast enhancement involving the right greater than left parieto-occipital regions. Findings felt to be most consistent with evolving subacute ischemic infarcts. Associated mild petechial hemorrhage without significant regional mass effect. Short interval follow-up MRI suggested to ensure that these changes resolve, and that no other underlying neoplastic or inflammatory process is present. --9/20 - Ongoing encephalopathy out of proportion to sedating meds used for agitation --Repeat MRI brain ordered -- shows multiple brain abscesses and new septic  emboli        Subjective: Patient seen this AM, somnolent but does briefly respond.  Does not follow commands or interact in any meaningful way.  Nursing report unable to give PO medications.  No family at bedside.   Physical Exam: Vitals:   02/07/22 2352 02/08/22 0400 02/08/22 0720 02/08/22 1145  BP: (!) 141/96 126/82 123/72 130/83  Pulse: 76 65 64 69  Resp: 18 18 18 17   Temp: 98.7 F (37.1 C) 98.5 F (36.9 C) 98.3 F (36.8 C) 98.4 F (36.9 C)  TempSrc: Axillary Axillary Oral Axillary  SpO2: 100% 96% 97% 96%  Weight:  47.2 kg     General exam: sleeping but arousable, no acute distress HEENT: moist mucus membranes, hearing grossly normal  Respiratory system: CTAB, no wheezes, rales or rhonchi, normal respiratory effort. Cardiovascular system: normal S1/S2, RRR, no pedal edema.   Gastrointestinal system: sunken abdomen, non-tender Central nervous system: unable to evaluate due to somnolence and agitation when he arouses, does not follow commands Extremities: mittens on both hands, no edema, normal tone Skin: dry, intact, normal temperature Psychiatry: normal mood, congruent affect, judgement and insight appear normal    Data Reviewed: Notable labs --- CBG's 102 >> 140 >> 127  CT head w/o contrast -- negative for acute intracranial findings. IMPRESSION: 1. 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 No associated hemorrhage or significant regional mass effect. 2. No other new acute intracranial abnormality. No evidence for traumatic injury status post fall.   Family Communication: attempted to reach son by phone to update on MRI findings and repeat Echo, did not leave voicemail per HIPAA and patient privacy   Disposition: Status  is: Inpatient Remains inpatient appropriate because: IV antibiotic therapy    Planned Discharge Destination:  Home     Author: Pennie Banter, DO 02/08/2022 5:09 PM  For on call review [email or web address].

## 2022-02-08 NOTE — Progress Notes (Signed)
PT Cancellation Note  Patient Details Name: Earl Moreno MRN: [id number] DOB: 08-30-1965   Cancelled Treatment:     PT attempt. Pt off floor in MRI. Will return when pt is available to participate.    Willette Pa 02/08/2022, 10:39 AM

## 2022-02-08 NOTE — Progress Notes (Signed)
   Date of Admission:  01/21/2022     ID: Earl Moreno is a 56 y.o. male  Principal Problem:   Septic shock ([place]) Active Problems:   Type 2 diabetes mellitus ([name])   Polysubstance (including opioids) dependence with physiol dependence ([name])   Essential hypertension   Protein-calorie malnutrition, severe   Altered mental status   Bacteremia   Aortic valve endocarditis   Acute HFrEF (heart failure with reduced ejection fraction) (HCC)   Osteomyelitis (HCC)   Normocytic anemia   Thrombocytopenia (HCC)   Ischemic cerebrovascular accident (CVA) ([name])    Subjective: Somnolent Went for MRI and received ativan for it     Medications:   acetaminophen  975 mg Oral Q6H   Chlorhexidine Gluconate Cloth  6 each Topical Daily   enoxaparin (LOVENOX) injection  40 mg Subcutaneous Q24H   feeding supplement  237 mL Oral TID BM   Ferrous Fumarate  1 tablet Oral BID   folic acid  1 mg Oral Daily   insulin aspart  0-15 Units Subcutaneous TID WC   insulin aspart  0-5 Units Subcutaneous QHS   methadone  20 mg Oral Daily   multivitamin with minerals  1 tablet Oral Daily   pantoprazole  40 mg Oral Daily   polyethylene glycol  17 g Oral Daily   senna  1 tablet Oral BID    Objective: Vital signs in last 24 hours:Patient Vitals for the past 24 hrs:  BP Temp Temp src Pulse Resp SpO2 Weight  02/08/22 0720 123/72 98.3 F (36.8 C) Oral 64 18 97 % --  02/08/22 0400 126/82 98.5 F (36.9 C) Axillary 65 18 96 % 47.2 kg  02/07/22 2352 (!) 141/96 98.7 F (37.1 C) Axillary 76 18 100 % --  02/07/22 2010 (!) 124/54 98.7 F (37.1 C) -- 73 16 100 % --  02/07/22 1526 (!) 129/57 98.7 F (37.1 C) Axillary 74 20 100 % --  02/07/22 1138 (!) 117/52 97.9 F (36.6 C) Axillary -- 20 100 % --      PHYSICAL EXAM:  General: somnolent, non verbal emaciated Lungs: b/l air entry Heart: s1s2 Abdomen: Soft, non-tender,not distended. Bowel sounds normal. No masses Neurologic:cannot assess  Lab  Results Recent Labs    02/06/22 0718  WBC 10.5  HGB 7.0*  HCT 22.2*  NA 135  K 3.7  CL 107  CO2 23  BUN 11  CREATININE 0.66     Microbiology: 01/22/22 - serratia 01/26/22 BC neg  MRI- Three ring enhancing lesions with edema around in the parietal lobe Multiple areas of microhemorrhages  Assessment/Plan: Encephalopathy secondary to sepsis and infarct - Still has delirium Due to multiple cerebral abscesses from septic emboli of aortic endocarditis. Has a vegetation > 1 cm- high risk for further emboli but he is not a candidate for surgery ?  Serratia bacteremia with aortic valve endocarditis On dual coverage with cefepime + cipro Repeat blood culture neg from 01/26/22 Will need for 6 weeks-- 03-28-22 On Meropenem currently  Osteomyelitis rt pubic ramus  IVDA  heroin user - now on methadone  20mg - may need to reduce this even further   -No resp distress    Anemia   Thrombocytopenia    Previous bacteremias including serratia , enterobacter- not treated because of him leaving AMA ?Discussed the management with care team Palliative consult to address treatment goals?

## 2022-02-08 NOTE — Progress Notes (Addendum)
Nutrition Follow-up  DOCUMENTATION CODES:   Severe malnutrition in context of social or environmental circumstances  INTERVENTION:   -Once NGT has been placed and placement is verified:  Initiate Osmolite 1.5 @ 20 ml/hr and increase by 10 ml every 12 hours to goal rate of 50 ml/hr.   60 ml Prosource TF 20 BID.    If no IVFs, 200 ml free water flush every 6 hours  Tube feeding regimen provides 1960 kcal (100% of needs), 115 grams of protein, and 914 ml of H2O. Total free water: 1714 ml daily  Monitor Mg, K, and Phos daily and replete as needed secondary to high refeeding risk  NUTRITION DIAGNOSIS:   Severe Malnutrition related to social / environmental circumstances as evidenced by severe muscle depletion, severe fat depletion.  Ongoing  GOAL:   Patient will meet greater than or equal to 90% of their needs  Unmet  MONITOR:   PO intake, Supplement acceptance, Labs, Weight trends, Skin, I & O's  REASON FOR ASSESSMENT:   Consult Enteral/tube feeding initiation and management  ASSESSMENT:   56 y/o male with h/o DM, substance abuse and HTN who is admitted with severe sepsis with septic shock secondary to osteomyelitis in the R hip and aortic valve endocarditis. MRI brain consistent with evolving subacute ischemic infarcts.  9/7- methadone ordered by psych 9/10- safety sitter initiated 9/16- DNR 9/18- s/p fall when trying to get out of bed  Reviewed I/O's: +3.2 L x 24 hours and +4.8 L since 01/25/22  UOP: 1.6 L x 24 hours   Pt sleeping in bed at time of visit. Aroused to voice briefly, but quickly went back to sleep. Pt would respond to questions with one word answers and then fall back asleep. Observed meal tray at bedside, which was untouched; RD attempted to arouse pt and encourage to eat, but was unsuccessful. Noted meal completions 0-20%. Pt will be unable to meet nutritional needs PO with current mental status.   Discussed concerns with RN and MD. Per MD, plan for  NGT, at lease initially. RD placed order for bedside NGT placement. Plan to initiate TF once placed.   Noted to BM since 01/27/22; bowel regimen started on 02/01/22. Suspect lack of nutrition of contributing to lack of BM.   Palliative care following for goals of care discussions.   Medications reviewed and include folic acid, miralax, senokot, and 0.9% sodium chloride infusion @ 100 ml/hr.   Labs reviewed: CBGS: 99-144 (inpatient orders for glycemic control are 0-15 units insulin aspart TID with meals and 0-5 units inuslin aspart daily at bedtime).    Diet Order:   Diet Order             Diet regular Room service appropriate? Yes; Fluid consistency: Thin  Diet effective now                   EDUCATION NEEDS:   No education needs have been identified at this time  Skin:  Skin Assessment: Skin Integrity Issues: Skin Integrity Issues:: DTI DTI: lt buttocks  Last BM:  01/27/22  Height:   Ht Readings from Last 1 Encounters:  01/14/22 5' 8.11" (1.73 m)    Weight:   Wt Readings from Last 1 Encounters:  02/08/22 47.2 kg    Ideal Body Weight:  72.7 kg  BMI:  Body mass index is 15.77 kg/m.  Estimated Nutritional Needs:   Kcal:  1750-1950  Protein:  100-115 grams  Fluid:  > 1.7 L  Loistine Chance, RD, LDN, [place] Registered Dietitian II Certified Diabetes Care and Education Specialist Please refer to [facility] for RD and/or RD on-call/weekend/after hours pager

## 2022-02-09 DIAGNOSIS — Z66 Do not resuscitate: Secondary | ICD-10-CM

## 2022-02-09 DIAGNOSIS — I358 Other nonrheumatic aortic valve disorders: Secondary | ICD-10-CM

## 2022-02-09 DIAGNOSIS — G060 Intracranial abscess and granuloma: Secondary | ICD-10-CM

## 2022-02-09 LAB — GLUCOSE, CAPILLARY
Glucose-Capillary: 125 mg/dL — ABNORMAL HIGH (ref 70–99)
Glucose-Capillary: 126 mg/dL — ABNORMAL HIGH (ref 70–99)
Glucose-Capillary: 128 mg/dL — ABNORMAL HIGH (ref 70–99)
Glucose-Capillary: 159 mg/dL — ABNORMAL HIGH (ref 70–99)
Glucose-Capillary: 163 mg/dL — ABNORMAL HIGH (ref 70–99)
Glucose-Capillary: 232 mg/dL — ABNORMAL HIGH (ref 70–99)

## 2022-02-09 LAB — BASIC METABOLIC PANEL
Anion gap: 5 (ref 5–15)
BUN: 14 mg/dL (ref 6–20)
CO2: 21 mmol/L — ABNORMAL LOW (ref 22–32)
Calcium: 7.6 mg/dL — ABNORMAL LOW (ref 8.9–10.3)
Chloride: 109 mmol/L (ref 98–111)
Creatinine, Ser: 0.55 mg/dL — ABNORMAL LOW (ref 0.61–1.24)
GFR, Estimated: >60 mL/min (ref >60)
Glucose, Bld: 172 mg/dL — ABNORMAL HIGH (ref 70–99)
Potassium: 3.3 mmol/L — ABNORMAL LOW (ref 3.5–5.1)
Sodium: 135 mmol/L (ref 135–145)

## 2022-02-09 LAB — CBC
HCT: 23.8 % — ABNORMAL LOW (ref 39.0–52.0)
Hemoglobin: 7.5 g/dL — ABNORMAL LOW (ref 13.0–17.0)
MCH: 27.4 pg (ref 26.0–34.0)
MCHC: 31.5 g/dL (ref 30.0–36.0)
MCV: 86.9 fL (ref 80.0–100.0)
Platelets: 110 10*3/uL — ABNORMAL LOW (ref 150–400)
RBC: 2.74 MIL/uL — ABNORMAL LOW (ref 4.22–5.81)
RDW: 15.2 % (ref 11.5–15.5)
WBC: 10.6 10*3/uL — ABNORMAL HIGH (ref 4.0–10.5)
nRBC: 0 % (ref 0.0–0.2)

## 2022-02-09 LAB — MAGNESIUM
Magnesium: 1.7 mg/dL (ref 1.7–2.4)
Magnesium: 1.9 mg/dL (ref 1.7–2.4)

## 2022-02-09 LAB — URINE DRUG SCREEN, QUALITATIVE (ARMC ONLY)
Amphetamines, Ur Screen: NOT DETECTED
Barbiturates, Ur Screen: NOT DETECTED
Benzodiazepine, Ur Scrn: NOT DETECTED
Cannabinoid 50 Ng, Ur ~~LOC~~: NOT DETECTED
Cocaine Metabolite,Ur ~~LOC~~: NOT DETECTED
MDMA (Ecstasy)Ur Screen: NOT DETECTED
Methadone Scn, Ur: POSITIVE — AB
Opiate, Ur Screen: NOT DETECTED
Phencyclidine (PCP) Ur S: NOT DETECTED
Tricyclic, Ur Screen: NOT DETECTED

## 2022-02-09 LAB — PHOSPHORUS
Phosphorus: 2.5 mg/dL (ref 2.5–4.6)
Phosphorus: 1.7 mg/dL — ABNORMAL LOW (ref 2.5–4.6)

## 2022-02-09 MED ORDER — POTASSIUM CHLORIDE 20 MEQ PO PACK
40.0000 meq | PACK | Freq: Once | ORAL | Status: AC
  Administered 2022-02-09: 40 meq
  Filled 2022-02-09: qty 2

## 2022-02-09 MED ORDER — ORAL CARE MOUTH RINSE: 15.0000 mL | OROMUCOSAL | Status: DC

## 2022-02-09 MED ORDER — ORAL CARE MOUTH RINSE
15.0000 mL | OROMUCOSAL | Status: DC
  Administered 2022-02-09 – 2022-02-11 (×5): 15 mL via OROMUCOSAL

## 2022-02-09 MED ORDER — ORAL CARE MOUTH RINSE: 15.0000 mL | OROMUCOSAL | Status: DC | PRN

## 2022-02-09 MED ORDER — FREE WATER
200.0000 mL | Freq: Four times a day (QID) | Status: DC
  Administered 2022-02-09 – 2022-02-10 (×4): 200 mL

## 2022-02-09 NOTE — Progress Notes (Signed)
Nutrition Follow-up  DOCUMENTATION CODES:   Severe malnutrition in context of social or environmental circumstances  INTERVENTION:   Continue TF via NGT:   Osmolite 1.5 @ 20 ml/hr and increase by 10 ml every 12 hours to goal rate of 50 ml/hr.    60 ml Prosource TF 20 BID.     200 ml free water flush every 6 hours   Tube feeding regimen provides 1960 kcal (100% of needs), 115 grams of protein, and 914 ml of H2O. Total free water: 1714 ml daily   Monitor Mg, K, and Phos daily and replete as needed secondary to high refeeding risk  NUTRITION DIAGNOSIS:   Severe Malnutrition related to social / environmental circumstances as evidenced by severe muscle depletion, severe fat depletion.  Ongoing  GOAL:   Patient will meet greater than or equal to 90% of their needs  Progressing   MONITOR:   PO intake, Supplement acceptance, Labs, Weight trends, Skin, I & O's  REASON FOR ASSESSMENT:   Consult Enteral/tube feeding initiation and management  ASSESSMENT:   56 y/o male with h/o DM, substance abuse and HTN who is admitted with severe sepsis with septic shock secondary to osteomyelitis in the R hip and aortic valve endocarditis. MRI brain consistent with evolving subacute ischemic infarcts.  9/7- methadone ordered by psych 9/10- safety sitter initiated 9/16- DNR 9/18- s/p fall when trying to get out of bed 9/20- NGT placed (placement in distal antrum of stomach), TF initiated   Reviewed I/O's:  +1.3 L x 24 hours and +8.3 L since 01/26/22  Pt sitting up in bed, yelling at nurse and nurse tech at time of visit. Pt with NGT; noted Osmolite 1.5 running at 20 ml/hr.   Medications reviewed and include miralax, folic acid, and senokot  Labs reviewed: K: 3.3, Mg and Phos WDL. CBGS: 88-159 (inpatient orders for glycemic control are 0-15 units insulin aspart TID with meals and 0-5 units insulin aspart daily at bedtime).    Diet Order:   Diet Order             Diet regular Room  service appropriate? Yes; Fluid consistency: Thin  Diet effective now                   EDUCATION NEEDS:   No education needs have been identified at this time  Skin:  Skin Assessment: Skin Integrity Issues: Skin Integrity Issues:: DTI DTI: lt buttocks  Last BM:  02/08/22 (type 6)  Height:   Ht Readings from Last 1 Encounters:  01/14/22 5' 8.11" (1.73 m)    Weight:   Wt Readings from Last 1 Encounters:  02/09/22 51.6 kg    Ideal Body Weight:  72.7 kg  BMI:  Body mass index is 17.24 kg/m.  Estimated Nutritional Needs:   Kcal:  6283-6629  Protein:  100-115 grams  Fluid:  > 1.7 L    Loistine Chance, RD, LDN, [place] Registered Dietitian II Certified Diabetes Care and Education Specialist Please refer to [facility] for RD and/or RD on-call/weekend/after hours pager

## 2022-02-09 NOTE — Consult Note (Signed)
Referring Physician:  No referring provider defined for this encounter.  Primary Physician:  Pcp, No  History of Present Illness: 02/09/2022 Earl Moreno is here with a chief complaint of feeling unwell.  He was diagnosed with sepsis and found to have a large vegetation on his heart.  He has a long history of IVDU.  He denies HA N V but is somewhat confused about his location.  He is not a reliable historian.  His full history from the chart was reviewed.  Review of Systems:  Unable to obtain  Past Medical History: Past Medical History:  Diagnosis Date   Diabetes mellitus without complication ([place])     Past Surgical History: Past Surgical History:  Procedure Laterality Date   I & D EXTREMITY Left 06/05/2018   Procedure: IRRIGATION AND DEBRIDEMENT EXTREMITY-LEFT HAND;  Surgeon: Lovell Sheehan, MD;  Location: [facility] ORS;  Service: Orthopedics;  Laterality: Left;    Allergies: Allergies as of 01/21/2022   (No Known Allergies)    Medications: Current Meds  Medication Sig   amLODipine (NORVASC) 10 MG tablet Take 1 tablet (10 mg total) by mouth daily.   Baclofen 5 MG TABS Take 5 mg by mouth 3 (three) times daily as needed for muscle spasms.   Blood Glucose Monitoring Suppl (BLOOD GLUCOSE MONITOR SYSTEM) w/Device KIT USE UP TO 4 TIMES DAILY AS DIRECTED.   Blood Glucose Monitoring Suppl (RIGHTEST GM550 BLOOD GLUCOSE) w/Device KIT Use as directed to test blood sugar up to 4 times daily   Insulin Lispro Prot & Lispro (HUMALOG MIX 75/25 KWIKPEN) (75-25) 100 UNIT/ML Kwikpen Inject 18 Units into the skin 2 (two) times daily.   Insulin Pen Needle (COMFORT EZ PEN NEEDLES) 32G X 4 MM MISC USE AS DIRECTED TWICE DAILY (MORNING AND BEDTIME)   Rightest GL300 Lancets MISC USE AS DIRECTED UP TO 4 TIMES DAILY.    Social History: Social History   Tobacco Use   Smoking status: Every Day    Packs/day: 0.50    Types: Cigarettes   Smokeless tobacco: Never  Substance Use Topics    Alcohol use: No   Drug use: Yes    Types: Cocaine, IV    Comment: Heroin    Family Medical History: Family History  Problem Relation Age of Onset   Diabetes Mother     Physical Examination: Vitals:   02/09/22 0111 02/09/22 0348  BP: 108/69 121/66  Pulse: 78 94  Resp: 20 18  Temp:  98.7 F (37.1 C)  SpO2: 100% 98%    General: Patient is well developed, well nourished, calm, collected, and in no apparent distress. Attention to examination is appropriate.  Neck:   Supple.  Full range of motion.  Respiratory: Patient is breathing without any difficulty.   NEUROLOGICAL:     Awake, alert, oriented to person and "March" 2023.  Speech is clear and fluent. Fund of knowledge is appropriate.   Thinks he is at a home.  Cranial Nerves: Pupils equal round and reactive to light.  Facial tone is symmetric.  Facial sensation is symmetric. Shoulder shrug is symmetric. Tongue protrusion is midline.  There is no pronator drift.  ROM of spine: full.    Strength: Side Biceps Triceps Deltoid Interossei Grip Wrist Ext. Wrist Flex.  R _0 L _1 Side Iliopsoas Quads Hamstring PF DF EHL  R _2 L 5  _0 Reflexes are 1+ and symmetric at the biceps, triceps, brachioradialis, patella and achilles.   Hoffman's is absent.  Bilateral upper and lower extremity sensation is intact to light touch.    No evidence of dysmetria noted.  Gait is untested  Medical Decision Making  Imaging: MRI Brain 02/08/22 IMPRESSION: 1. Three ring-enhancing right parietal lobe lesions with surrounding edema. Enhancement is slightly more prominent. These areas continue to show restricted diffusion with mild progression from the prior study. Mild progression of petechial hemorrhage in the lesions. Given history of IV drug abuse and ring-enhancing lesion progression, brain abscesses are considered most likely. Subacute noninfected infarct considered less likely. 2. New  areas of restricted diffusion adjacent to the occipital horn bilaterally and in the cerebellum bilaterally likely due to additional embolic infarct. 3. New small area of microhemorrhage in the right lateral cerebellum. Stable microhemorrhage left occipital lobe. New area of small subarachnoid hemorrhage right parietal convexity   Electronically Signed: By: Franchot Gallo M.D. On: 02/08/2022 11:12  I have personally reviewed the images and agree with the above interpretation.  Assessment and Plan: [name] is a pleasant 56 y.o. male with history of bacteremia from IVDU who has endocarditis and 3 small septic emboli with change on recent imaging. He is not a candidate for surgery for his heart.  - would recommend consideration of hospice given no option for addressing his heart issue - Abx per primary and ID - Consider MRI next week to evaluate treatment response   Thank you for involving me in the care of this patient.      Carroll Lingelbach K. Izora Ribas MD, [facility] Neurosurgery

## 2022-02-09 NOTE — Progress Notes (Signed)
   02/09/22 1400  Clinical Encounter Type  Visited With Patient  Visit Type Follow-up;Social support  Referral From Nurse  Consult/Referral To Chaplain  Spiritual Encounters  Spiritual Needs Grief support   Chaplain responded to [facility] consult to provide grief support after patient received some difficult news.

## 2022-02-09 NOTE — Progress Notes (Addendum)
Progress Note   Patient: Earl Moreno WCH:[id number] DOB: Mar 10, 1966 DOA: 01/21/2022     18 DOS: the patient was seen and examined on 02/09/2022   Brief hospital course: ICU transfer Following summary taken from prior notes. 56 yo M with history of IVDA, recurrent hospitalization and leaving AMA, presenting to [facility] ED on 01/21/22 via EMS complaining that he does not feel well.   Per chart review patient with multiple admissions for sepsis including pyelonephritis, GN bacteremia & suspected osteomyelitis in his right hip as recently as 01/15/22. There have been multiple attempts to treat with IV antibiotics & perform a TEE and rule out endocarditis but the patient continues to leave AMA.  On arrival to ED patient was febrile with leukocytosis, tachycardic, tachypneic, mild hyponatremia, lactic acidosis and elevated procalcitonin.  COVID-19 and influenza PCR was negative.  UDS positive for cocaine and opioids. ... Due to pt being very combative and agitated despite giving multiple doses of Ativan and Haldol, he was started on Precedex and maxed out without much change, resulted in intubation and mechanical ventilation.  Extubated on 01/25/2022.  Patient was found to have Serratia bacteremia with aortic valve endocarditis, infectious disease was consulted and patient is currently on dual coverage with cefepime and ciprofloxacin.  Repeat blood cultures on 01/26/2022 negative.  Patient was also evaluated by [facility] cardiothoracic surgery and deemed not a candidate for surgical intervention.  Psych was also consulted for opioid withdrawal and he was started on methadone on 01/26/2022 at 40 mg daily-dose will be titrated by psych to keep him comfortable so he will not leave another AMA.  Palliative care was also consulted and he was made DNR with current level of care and treat the treatable.  They continue to follow for goal of care discussion given overall poor prognosis given extent of his infection and  complications.  TRH to resume care from 01/27/2022.  9/12 and 9/20: UDS was checked at sister's request, suspecting pt's son giving him some drugs.  UDS only positive for methadone which he is taking it here.  No other concerns.  9/18 patient sustained a mechanical fall while trying to get out of bed. He attempted to go the restroom to urinate.   9/20 - repeated MRI brain given his persistent encephalopathy and lethargy, as initial showed subacute infarcts.  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  Reaching back out to CT surgery -- surgery not an option due to extremely high risk of hemorrhagic stroke given brain findings.   9/20: started on NG tube feeds  Patient will need prolonged hospitalization to complete IV antibiotic course (Mar 14, 2022) secondary to his history of IV drug use.  Remains very high risk for deterioration and death.   Assessment and Plan: * Septic shock (HCC) Patient initially admitted with severe sepsis and septic shock secondary to Serratia bacteremia and aortic valve endocarditis with his history of IV drug abuse. Multiple recent infections including pyelonephritis and osteomyelitis which were not treated adequately as he was keeps leaving AMA. Patient was intubated and required pressors in the beginning.  Extubated on 01/25/2022 and was weaned off from pressors. Blood pressure still on softer side. Repeat blood cultures on 9/7 remain negative in 24-hour. ID is on board. -Continue antibiotics per ID -On meropenem (previously cefepime, cipro) -Patient will need  long-term antibiotics and prolonged hospitalization.  Ischemic cerebrovascular accident (CVA) ([place]) Due to septic emboli. MRI brain on 01/22/22 showed Patchy small volume areas of diffusion  signal abnormality with associated postcontrast enhancement involving the right greater than left parieto-occipital regions. Findings felt to be most consistent with evolving subacute ischemic infarcts. Associated mild petechial hemorrhage without significant regional mass effect. Short interval follow-up MRI suggested to ensure that these changes resolve, and that no other underlying neoplastic or inflammatory process is present. --9/20 - Ongoing encephalopathy out of proportion to sedating meds used for agitation --Repeat MRI brain ordered -- shows multiple brain abscesses and new septic emboli  Bacteremia Continue antibiotic therapy per ID. Was on Cefepime, Cipro. Now on Merrem.  Aortic valve endocarditis Found to have large aortic valve vegetation with history of Serratia bacteremia and IV drug use. Not a suitable candidate for any surgical intervention according to [facility] cardiothoracic surgery. 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    Now mental status is multifactorial but now likely due to brain abscesses and multiple septic emboli. He had been having intermittent agitation and delirium, requiring Haldol.  Sedating meds have since been held and minimized.  --Continue with as needed haloperidol (avoid if possible0.  --Methadone 20 mg daily --As needed trazodone at night. --Continue sitter one to one observation for safety as needed.      Osteomyelitis ([place]) Right ischial tuberosity osteomyelitis on MRI which is little unusual site for osteo.  No abscess or overlying ulcer.  Positive blood cultures, most likely blood seeding. -On appropriate antibiotics  Essential hypertension Patient was on amlodipine at home.  Admitted with septic shock  requiring pressors. Blood pressure still on softer side-weaned off from pressors.  Continue to hold on antihypertensive medications.   Acute HFrEF (heart failure with reduced ejection fraction) (HCC) EF of 45 to 50% with global hypokinesis.  Decreased from his prior echocardiogram done in July 2023.  Most likely secondary to aortic endocarditis. Clinically appears euvolemic.  No clinical signs of exacerbation.   Polysubstance (including opioids) dependence with physiol dependence ([place]) History of IV opioid use for many years. Psych was consulted for concern of opioid withdrawal and he was started on methadone to prevent another AMA.  Patient on methadone 20 mg daily with good toleration.   Type 2 diabetes mellitus (HCC) Mild hypoglycemia with CBG in 70s.  Fasting glucose this am 104, capillary 80, 232 and 166.  Plan to continue insulin sliding scale for glucose cover and monitoring, continue with basal and pre meal insulin therapy.   Normocytic anemia Most likely secondary to bone marrow suppression with sepsis. Anemia panel shows anemia of chronic disease with some iron deficiency. Continue with oral iron supplementation  Hgb is 7.5 - improved from 7.0 Monitor CBC.  Thrombocytopenia ([place]) Patient developed sudden onset thrombocytopenia to nadir of platelets 43k. HIT panel was negative. DIC labs were negative. Most likely secondary to bone marrow suppression with sepsis.  No obvious bleeding. Platelets improving 110k. Monitor CBC.  Protein-calorie malnutrition, severe Estimated body mass index is 15.67 kg/m as calculated from the following:   Height as of 01/14/22: 5' 8.11" (1.73 m).   Weight as of this encounter: 46.9 kg.   Continue with nutritional supplements.  Started on tube feeds by NG tube 02/08/22. Dietitian following.  Intracranial abscess Discussed with Neurosurgery - see their consult note.  No intervention at this time. Continue IV antibiotics and monitor  closely.         Subjective: Patient seen this AM, sister at bedside with another elderly male family member.  Sister reminds me I cared for their mother after her stroke a few months ago.  We discussed the repeat MRI brain and echo results from yesterday, unable to do surgery to fix heart without likely causing bleeding into the brain.  Pt was actually awake, talkative and able to understand the extent of his illness and what I was saying, and he stated multiple times that 'he's ready'.  He asks if he can leave the hospital to smoke a cigarette and contact his "old lady".   Physical Exam: Vitals:   02/09/22 0111 02/09/22 0348 02/09/22 0350 02/09/22 1304  BP: 108/69 121/66  122/60  Pulse: 78 94  75  Resp: 20 18  (!) 21  Temp:  98.7 F (37.1 C)  97.6 F (36.4 C)  TempSrc:    Oral  SpO2: 100% 98%  92%  Weight:   51.6 kg    General exam: sleeping but arousable, no acute distress HEENT: NG tube in place with tube feeds running, moist mucus membranes, hearing grossly normal  Respiratory system: CTAB, no wheezes, rales or rhonchi, normal respiratory effort. Cardiovascular system: normal S1/S2, RRR, no pedal edema.   Gastrointestinal system: sunken abdomen, non-tender Central nervous system: unable to evaluate due to somnolence and agitation when he arouses, does not follow commands Extremities: mittens on both hands, no edema, normal tone Skin: dry, intact, normal temperature Psychiatry: normal mood, congruent affect, judgement and insight appear normal    Data Reviewed: Notable labs --- CBG's 102 >> 140 >> 127  CT head w/o contrast -- negative for acute intracranial findings. IMPRESSION: 1. 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 No associated hemorrhage or significant regional mass effect. 2. No other new acute intracranial abnormality. No evidence for traumatic  injury status post fall.   Family Communication: attempted to reach son by phone to update on MRI findings and repeat Echo, did not leave voicemail per HIPAA and patient privacy   Disposition: Status is: Inpatient Remains inpatient appropriate because: IV antibiotic therapy    Planned Discharge Destination: Home     Author: Pennie Banter, DO 02/09/2022 3:16 PM  For on call review [email or web address].

## 2022-02-09 NOTE — Assessment & Plan Note (Addendum)
Discussed with Neurosurgery - see their consult note.  No intervention at this time. Continue IV antibiotics and monitor closely.  Repeat MRI brain in about 1 week or if changes in neurologic status.

## 2022-02-09 NOTE — Progress Notes (Signed)
Physical Therapy Treatment Patient Details Name: Earl Moreno MRN: [id number] DOB: 06/04/1965 Today's Date: 02/09/2022   History of Present Illness Pt is a 56 y.o. male  with past medical history of multiple admissions for sepsis including pyelonephritis, GN bacteremia and suspected osteomyelitis in his right hip as recently as 01/15/2022 with multiple attempts to treat with IV antibiotics but patient continues to leave AMA, history of heroin abuse, DM 2, splenic infarct, admitted on 01/21/2022 with septic shock initially requiring intubation/ventilation.    PT Comments    Pt presents to PT in bed and agreeable to participate in therapy services. Pt was pleasant and motivated to participate during the session and put forth good effort throughout. Pt required no physical assistance for any aspect of functional mobility during today's session, and had no LOB. Did require step by step cueing for step to gait pattern w RW. Also is easily distracted and required frequent redirection. Would benefit from skilled PT at SNF to address above deficits in strength, activity tolerance, functional mobility, and balance to promote optimal return to PLOF.    Recommendations for follow up therapy are one component of a multi-disciplinary discharge planning process, led by the attending physician.  Recommendations may be updated based on patient status, additional functional criteria and insurance authorization.  Follow Up Recommendations  Skilled nursing-short term rehab (<3 hours/day) Can patient physically be transported by private vehicle: Yes   Assistance Recommended at Discharge Frequent or constant Supervision/Assistance  Patient can return home with the following A little help with walking and/or transfers;A little help with bathing/dressing/bathroom;Assistance with cooking/housework;Direct supervision/assist for financial management;Assist for transportation;Direct supervision/assist for medications  management   Equipment Recommendations  Rolling walker (2 wheels)    Recommendations for Other Services       Precautions / Restrictions Precautions Precautions: Fall Restrictions Weight Bearing Restrictions: No     Mobility  Bed Mobility Overal bed mobility: Needs Assistance Bed Mobility: Supine to Sit, Sit to Supine     Supine to sit: Supervision Sit to supine: Supervision        Transfers Overall transfer level: Needs assistance Equipment used: Rolling walker (2 wheels) Transfers: Sit to/from Stand Sit to Stand: Min guard                Ambulation/Gait Ambulation/Gait assistance: Min guard Gait Distance (Feet): 6 Feet Assistive device: Rolling walker (2 wheels) Gait Pattern/deviations: Step-to pattern Gait velocity: decr     General Gait Details: requires step by step verbal cueing for step to gait pattern w RW   Stairs             Wheelchair Mobility    Modified Rankin (Stroke Patients Only)       Balance Overall balance assessment: Needs assistance Sitting-balance support: Feet supported Sitting balance-Leahy Scale: Good     Standing balance support: Bilateral upper extremity supported, During functional activity Standing balance-Leahy Scale: Fair                              Cognition Arousal/Alertness: Awake/alert Behavior During Therapy: WFL for tasks assessed/performed Overall Cognitive Status: Impaired/Different from baseline Area of Impairment: Memory, Following commands, Safety/judgement, Awareness, Problem solving                         Safety/Judgement: Decreased awareness of safety, Decreased awareness of deficits   Problem Solving: Slow processing, Decreased initiation, Difficulty sequencing, Requires verbal cues,  Requires tactile cues General Comments: Pt able to hold conversation and follow commands but is easily distracted and requires frequent redirection        Exercises       General Comments        Pertinent Vitals/Pain Pain Assessment Pain Assessment: No/denies pain    Home Living                          Prior Function            PT Goals (current goals can now be found in the care plan section) Acute Rehab PT Goals Patient Stated Goal: to go home PT Goal Formulation: With patient Time For Goal Achievement: 02/23/22 Progress towards PT goals: Progressing toward goals    Frequency    Min 2x/Week      PT Plan Current Plan remains appropriate    Co-evaluation              AM-PAC PT "6 Clicks" Mobility   Outcome Measure  Help needed turning from your back to your side while in a flat bed without using bedrails?: None Help needed moving from lying on your back to sitting on the side of a flat bed without using bedrails?: None Help needed moving to and from a bed to a chair (including a wheelchair)?: A Little Help needed standing up from a chair using your arms (e.g., wheelchair or bedside chair)?: A Little Help needed to walk in hospital room?: A Little Help needed climbing 3-5 steps with a railing? : A Lot 6 Click Score: 19    End of Session Equipment Utilized During Treatment: Gait belt Activity Tolerance: Patient tolerated treatment well Patient left: in bed;with call bell/phone within reach;with bed alarm set Nurse Communication: Mobility status PT Visit Diagnosis: Muscle weakness (generalized) (M62.81);Difficulty in walking, not elsewhere classified (R26.2);Unsteadiness on feet (R26.81)     Time: 8242-3536 PT Time Calculation (min) (ACUTE ONLY): 26 min  Charges:                        Glenice Laine MPH, SPT 02/09/22, 11:46 AM

## 2022-02-09 NOTE — Progress Notes (Signed)
Date of Admission:  01/21/2022     ID: KIRK BASQUEZ is a 56 y.o. male  Principal Problem:   Septic shock ([place]) Active Problems:   Type 2 diabetes mellitus ([place])   Polysubstance (including opioids) dependence with physiol dependence ([place])   Essential hypertension   Protein-calorie malnutrition, severe   Altered mental status   Bacteremia   Aortic valve endocarditis   Acute HFrEF (heart failure with reduced ejection fraction) (HCC)   Osteomyelitis (HCC)   Normocytic anemia   Thrombocytopenia (HCC)   Ischemic cerebrovascular accident (CVA) ([place])   Intracranial abscess    Subjective: Pt is awake Responding to questions appropriately  minimal confusion     Medications:   acetaminophen  975 mg Per Tube Q6H   Chlorhexidine Gluconate Cloth  6 each Topical Daily   enoxaparin (LOVENOX) injection  40 mg Subcutaneous Q24H   feeding supplement  237 mL Oral TID BM   feeding supplement (PROSource TF20)  60 mL Per Tube BID   Ferrous Fumarate  1 tablet Oral BID   folic acid  1 mg Per Tube Daily   free water  200 mL Per Tube Q6H   insulin aspart  0-15 Units Subcutaneous TID WC   insulin aspart  0-5 Units Subcutaneous QHS   methadone  20 mg Per Tube Daily   multivitamin with minerals  1 tablet Per Tube Daily   mouth rinse  15 mL Mouth Rinse 4 times per day   pantoprazole  40 mg Per Tube Daily   polyethylene glycol  17 g Per Tube Daily   senna  1 tablet Per Tube BID    Objective: Vital signs in last 24 hours:Patient Vitals for the past 24 hrs:  BP Temp Temp src Pulse Resp SpO2 Weight  02/09/22 1304 122/60 97.6 F (36.4 C) Oral 75 (!) 21 92 % --  02/09/22 0350 -- -- -- -- -- -- 51.6 kg  02/09/22 0348 121/66 98.7 F (37.1 C) -- 94 18 98 % --  02/09/22 0111 108/69 -- -- 78 20 100 % --  02/08/22 2104 131/72 97.8 F (36.6 C) -- 79 16 (!) 77 % --  02/08/22 1935 137/70 98.4 F (36.9 C) -- 80 20 99 % --      PHYSICAL EXAM:  General: awake, knows he is in hospital Follows  commands, moves all extremities Says he is hungry emaciated Lungs: b/l air entry Heart: s1s2 Abdomen: Soft, non-tender,not distended. Bowel sounds normal. No masses Neurologic:moves all extremities  Lab Results Recent Labs    02/09/22 0603 02/09/22 1153  WBC  --  10.6*  HGB  --  7.5*  HCT  --  23.8*  NA 135  --   K 3.3*  --   CL 109  --   CO2 21*  --   BUN 14  --   CREATININE 0.55*  --      Microbiology: 01/22/22 - serratia 01/26/22 BC neg  MRI- Three ring enhancing lesions with edema around in the parietal lobe Multiple areas of microhemorrhages  Assessment/Plan: Encephalopathy secondary to sepsis and infarct - Due to multiple cerebral abscesses from septic emboli of aortic endocarditis. Has a vegetation > 1 cm- high risk for further emboli but he is not a candidate for surgery The antibiotics cefepime and cipro were also likely contributing to the encephalopathy- They were discontinued 48 hrs ago and changed to meropenem and he is more alert today ?  Serratia bacteremia with aortic valve endocarditis Repeat  blood culture neg from 01/26/22 Will need for 6 weeks-- 28-Mar-2022 On Meropenem currently  Osteomyelitis rt pubic ramus  IVDA  heroin user - now on methadone  20mg - may need to reduce this even further   -No resp distress   Anemia   Thrombocytopenia    Previous bacteremias including serratia , enterobacter- not treated because of him leaving AMA ?Discussed the management with care team  RCID covering for the nexxt three days by phone- call if needed

## 2022-02-09 NOTE — Progress Notes (Signed)
Daily Progress Note   Patient Name: Earl Moreno       Date: 02/09/2022 DOB: [birth year]  Age: 56 y.o. MRN#: [id number] Attending Physician: Ezekiel Slocumb, DO Primary Care Physician: Pcp, No Admit Date: 01/21/2022  Reason for Consultation/Follow-up: Establishing goals of care  Subjective: Patient more alert today however remains slightly confused - he is oriented to self and knows he is in the hospital but keeps referring to also being at "the other place". He is uncertain about time and situation. Tells me he is in the hospital because he couldn't eat but when I mention his heart valve infection he tells me "oh yeah, I know about that". Denies complaints other than his NG tube bothering him and he wants it out. Tells me he hasnt seen his family in a couple days though nursing reports sister has visited.   Length of Stay: 18  Current Medications: Scheduled Meds:   acetaminophen  975 mg Per Tube Q6H   Chlorhexidine Gluconate Cloth  6 each Topical Daily   enoxaparin (LOVENOX) injection  40 mg Subcutaneous Q24H   feeding supplement  237 mL Oral TID BM   feeding supplement (PROSource TF20)  60 mL Per Tube BID   Ferrous Fumarate  1 tablet Oral BID   folic acid  1 mg Per Tube Daily   free water  200 mL Per Tube Q6H   insulin aspart  0-15 Units Subcutaneous TID WC   insulin aspart  0-5 Units Subcutaneous QHS   methadone  20 mg Per Tube Daily   multivitamin with minerals  1 tablet Per Tube Daily   mouth rinse  15 mL Mouth Rinse 4 times per day   pantoprazole  40 mg Per Tube Daily   polyethylene glycol  17 g Per Tube Daily   senna  1 tablet Per Tube BID    Continuous Infusions:  sodium chloride 10 mL/hr at 02/09/22 0600   sodium chloride 100 mL/hr at 02/09/22 1241   feeding supplement (OSMOLITE 1.5  CAL) 30 mL/hr at 02/09/22 1033   meropenem (MERREM) IV 1 g (02/09/22 1312)    PRN Meds: sodium chloride, bisacodyl, docusate, haloperidol lactate, ipratropium-albuterol, mouth rinse, sodium phosphate, traZODone  Physical Exam Constitutional:      General: He is not in acute distress.    Appearance: He is ill-appearing.  Pulmonary:     Effort: Pulmonary effort is normal.  Skin:    General: Skin is warm and dry.  Neurological:     Mental Status: He is alert.     Comments: confused             Vital Signs: BP 122/60 (BP Location: Right Arm)   Pulse 75   Temp 97.6 F (36.4 C) (Oral)   Resp (!) 21   Wt 51.6 kg   SpO2 92%   BMI 17.24 kg/m  SpO2: SpO2: 92 % O2 Device: O2 Device: Room Air O2 Flow Rate:    Intake/output summary:  Intake/Output Summary (Last 24 hours) at 02/09/2022 1313 Last data filed at 02/09/2022 1300 Gross per 24 hour  Intake 3429.93 ml  Output 400 ml  Net 3029.93 ml   LBM: Last BM Date :  01/27/22 Baseline Weight: Weight: 53.2 kg Most recent weight: Weight: 51.6 kg       Palliative Assessment/Data: PPS 40%    Flowsheet Rows    Flowsheet Row Most Recent Value  Intake Tab   Referral Department Hospitalist  Unit at Time of Referral Intermediate Care Unit  Palliative Care Primary Diagnosis Sepsis/Infectious Disease  Date Notified 01/25/22  Palliative Care Type New Palliative care  Reason for referral Clarify Goals of Care  Date of Admission 01/21/22  Date first seen by Palliative Care 01/26/22  # of days Palliative referral response time 1 Day(s)  # of days IP prior to Palliative referral 4  Clinical Assessment   Palliative Performance Scale Score 40%  Pain Max last 24 hours Not able to report  Pain Min Last 24 hours Not able to report  Dyspnea Max Last 24 Hours Not able to report  Dyspnea Min Last 24 hours Not able to report  Psychosocial & Spiritual Assessment   Palliative Care Outcomes        Patient Active Problem List   Diagnosis  Date Noted   Intracranial abscess    Ischemic cerebrovascular accident (CVA) (HCC) 02/08/2022   Normocytic anemia 01/27/2022   Thrombocytopenia (HCC) 01/27/2022   Osteomyelitis (HCC)    Aortic valve endocarditis 01/24/2022   Acute HFrEF (heart failure with reduced ejection fraction) (HCC) 01/24/2022   Septic shock (HCC) 01/22/2022   Altered mental status    Bacteremia    Hypokalemia 01/15/2022   Pelvic mass 01/15/2022   Behavioral problem 01/15/2022   Abscess of multiple sites    Protein-calorie malnutrition, severe 02/11/2020   Type 2 diabetes mellitus (HCC) 02/09/2020   Hyperglycemia due to diabetes mellitus (HCC) 02/09/2020   Abscess 02/09/2020   Polysubstance (including opioids) dependence with physiol dependence (HCC) 02/09/2020   Hyponatremia 02/09/2020   Essential hypertension 02/09/2020   Cellulitis of left hand 06/02/2018    Palliative Care Assessment & Plan   HPI: 56 y.o. male  with past medical history of multiple admissions for sepsis including pyelonephritis, GN bacteremia and suspected osteomyelitis in his right hip as recently as 01/15/2022 with multiple attempts to treat with IV antibiotics but patient continues to leave AMA, history of heroin abuse, DM 2, splenic infarct, admitted on 01/21/2022 with septic shock initially requiring intubation/ventilation.   Assessment: Palliative care asked to reengage by Dr. Denton Lank d/t MRI w/multiple ring-enhancing lesions consistent with brain abscesses and multiple new emboli, likely septic in nature. Also worsening aortic regurg. CVTS rec palliative care given severity of endocarditis.   Attempted discussion with patient but d/t ongoing confusion would like to include family for goals of care discussions.   Per previous palliative consultation patient reported he had a wife but she is hospitalized and so he reported he would want his son Ree Kida to make medical decisions for him. I attempted to reach [place] by phone but call went  straight to voicemail - left voicemail with call back number. Per other team members, Ree Kida has not been reachable by phone.   Called patient's sister Bjorn Loser who tells me she is most appropriate medical decision maker. We discuss that patient does not have documented HCPOA> we discuss that in these situation we go to next of kin to assist with decision making. She tells me patient is not legally married - the "spouse" he reported is a girlfriend. She tells me one of patient's son's is cognitively impaired and unable to make medical decisions. She tells me the other son is  unable to make decisions r/t drug use and being unavailable. She tells me she has served as Clinical research associate in the past for Mr. Sample.  We discuss some new significant findings in Mr. Hardenbrook medical course and need for goals of care discussions. She asks that we meet tomorrow morning 8:30.   Above discussed with Dr. Denton Lank.   Recommendations/Plan: Meeting with sister 8:30 AM 9/22  Code Status: DNR  Care plan was discussed with patient's sister Bjorn Loser and Dr. Denton Lank  Thank you for allowing the Palliative Medicine Team to assist in the care of this patient.   *Please note that this is a verbal dictation therefore any spelling or grammatical errors are due to the "[employer]" system interpretation.  Gerlean Ren, DNP, [facility] Palliative Medicine Team Team Phone # [phone]  Pager [phone]

## 2022-02-10 DIAGNOSIS — Z7189 Other specified counseling: Secondary | ICD-10-CM

## 2022-02-10 LAB — MAGNESIUM: Magnesium: 1.8 mg/dL (ref 1.7–2.4)

## 2022-02-10 LAB — PHOSPHORUS: Phosphorus: 1.6 mg/dL — ABNORMAL LOW (ref 2.5–4.6)

## 2022-02-10 LAB — BASIC METABOLIC PANEL
Anion gap: 7 (ref 5–15)
BUN: 14 mg/dL (ref 6–20)
CO2: 21 mmol/L — ABNORMAL LOW (ref 22–32)
Calcium: 7.7 mg/dL — ABNORMAL LOW (ref 8.9–10.3)
Chloride: 105 mmol/L (ref 98–111)
Creatinine, Ser: 0.57 mg/dL — ABNORMAL LOW (ref 0.61–1.24)
GFR, Estimated: >60 mL/min (ref >60)
Glucose, Bld: 122 mg/dL — ABNORMAL HIGH (ref 70–99)
Potassium: 4.6 mmol/L (ref 3.5–5.1)
Sodium: 133 mmol/L — ABNORMAL LOW (ref 135–145)

## 2022-02-10 LAB — GLUCOSE, CAPILLARY
Glucose-Capillary: 102 mg/dL — ABNORMAL HIGH (ref 70–99)
Glucose-Capillary: 102 mg/dL — ABNORMAL HIGH (ref 70–99)
Glucose-Capillary: 105 mg/dL — ABNORMAL HIGH (ref 70–99)
Glucose-Capillary: 113 mg/dL — ABNORMAL HIGH (ref 70–99)
Glucose-Capillary: 113 mg/dL — ABNORMAL HIGH (ref 70–99)
Glucose-Capillary: 161 mg/dL — ABNORMAL HIGH (ref 70–99)

## 2022-02-10 MED ORDER — SODIUM PHOSPHATES 45 MMOLE/15ML IV SOLN
15.0000 mmol | Freq: Once | INTRAVENOUS | Status: AC
  Administered 2022-02-10: 15 mmol via INTRAVENOUS
  Filled 2022-02-10 (×2): qty 5

## 2022-02-10 MED ORDER — ALTEPLASE 2 MG IJ SOLR
2.0000 mg | Freq: Once | INTRAMUSCULAR | Status: AC
  Administered 2022-02-10: 2 mg
  Filled 2022-02-10: qty 2

## 2022-02-10 MED ORDER — POTASSIUM PHOSPHATES 15 MMOLE/5ML IV SOLN: 15.0000 mmol | Freq: Once | INTRAVENOUS | Status: DC

## 2022-02-10 MED ORDER — STERILE WATER FOR INJECTION IJ SOLN
INTRAMUSCULAR | Status: AC
  Filled 2022-02-10: qty 10

## 2022-02-10 NOTE — Evaluation (Signed)
Occupational Therapy RE-Evaluation Patient Details Name: Earl Moreno MRN: [id number] DOB: 12/14/1965 Today's Date: 02/10/2022   History of Present Illness Pt is a 55 y.o. male  with past medical history of multiple admissions for sepsis including pyelonephritis, GN bacteremia and suspected osteomyelitis in his right hip as recently as 01/15/2022 with multiple attempts to treat with IV antibiotics but patient continues to leave AMA, history of heroin abuse, DM 2, splenic infarct, admitted on 01/21/2022 with septic shock initially requiring intubation/ventilation.   Clinical Impression   Upon entering the room, pt sis supine in bed and sleeping with sitter present in room. Pt alerts to touch. He has pictures hanging up in his room and tells therapist about who is in the pictures. He believes sitter to be his mother. He has confusion throughout session and needing to be reoriented and redirected but is not agitated during session. He does very clearly state he does not want to get out of bed during this session. He is agreeable to allow therapist to reposition him in bed into R side lying position for pressure relief. All needs within reach and bed alarm activated with sitter remaining present in room. Pt is more alert this week but overall has made minimal progress with therapy. His goals are downgraded to min A overall. He has also had some other significant medical concerns this well and family meeting with pallative care to determine patient's goals of care. OT will continue efforts in hopes that pt's progress will improve.      Recommendations for follow up therapy are one component of a multi-disciplinary discharge planning process, led by the attending physician.  Recommendations may be updated based on patient status, additional functional criteria and insurance authorization.   Follow Up Recommendations  Skilled nursing-short term rehab (<3 hours/day)    Assistance Recommended at Discharge  Frequent or constant Supervision/Assistance  Patient can return home with the following Assistance with cooking/housework;Direct supervision/assist for medications management;Direct supervision/assist for financial management;Assist for transportation;Two people to help with walking and/or transfers;Two people to help with bathing/dressing/bathroom       Equipment Recommendations  Other (comment) (defer to next venue of care)       Precautions / Restrictions Precautions Precautions: Fall      Mobility Bed Mobility Overal bed mobility: Needs Assistance Bed Mobility: Rolling Rolling: Mod assist              Transfers                   General transfer comment: Pt refused          ADL either performed or assessed with clinical judgement      Vision Patient Visual Report: No change from baseline              Pertinent Vitals/Pain Pain Assessment Pain Assessment: Faces Faces Pain Scale: No hurt     Hand Dominance     Extremity/Trunk Assessment Upper Extremity Assessment Upper Extremity Assessment: Generalized weakness              Cognition Arousal/Alertness: Awake/alert Behavior During Therapy: Flat affect Overall Cognitive Status: Impaired/Different from baseline Area of Impairment: Memory, Following commands, Safety/judgement, Awareness, Problem solving                 Orientation Level: Person Current Attention Level: Focused Memory: Decreased recall of precautions, Decreased short-term memory Following Commands: Follows one step commands inconsistently Safety/Judgement: Decreased awareness of safety, Decreased awareness of deficits  Problem Solving: Slow processing, Decreased initiation, Difficulty sequencing, Requires verbal cues, Requires tactile cues         OT Goals(Current goals can be found in the care plan section) Acute Rehab OT Goals OT Goal Formulation: With patient ADL Goals Pt Will Perform Grooming: with min  guard assist;standing Pt Will Perform Lower Body Dressing: with min assist;sit to/from stand Pt Will Transfer to Toilet: with min guard assist;ambulating Pt Will Perform Toileting - Clothing Manipulation and hygiene: with min assist;sit to/from stand  OT Frequency: Min 2X/week       AM-PAC OT "6 Clicks" Daily Activity     Outcome Measure Help from another person eating meals?: A Lot Help from another person taking care of personal grooming?: A Lot Help from another person toileting, which includes using toliet, bedpan, or urinal?: A Lot Help from another person bathing (including washing, rinsing, drying)?: A Lot Help from another person to put on and taking off regular upper body clothing?: A Lot Help from another person to put on and taking off regular lower body clothing?: A Lot 6 Click Score: 12   End of Session Equipment Utilized During Treatment: Rolling walker (2 wheels) Nurse Communication: Mobility status  Activity Tolerance: Patient limited by fatigue Patient left: in bed;with call bell/phone within reach;with nursing/sitter in room;with bed alarm set  OT Visit Diagnosis: Unsteadiness on feet (R26.81);Muscle weakness (generalized) (M62.81)                Time: 4193-7902 OT Time Calculation (min): 12 min Charges:  OT General Charges $OT Visit: 1 Visit OT Evaluation $OT Re-eval: 1 Re-eval OT Treatments $Therapeutic Activity: 8-22 mins Darleen Crocker, MS, OTR/L , CBIS ascom [phone]  02/10/22, 11:02 AM

## 2022-02-10 NOTE — Progress Notes (Signed)
NG tube was clogged during night shift last night so I pulled it (intact) this morning. Pt swallowed a few pills with water this morning and ate 2/3 of a hamburger with more sips of water at lunch. No aspirating noted.

## 2022-02-10 NOTE — Progress Notes (Signed)
   02/10/22 1000  Clinical Encounter Type  Visited With Patient  Visit Type Follow-up;Spiritual support  Referral From Nurse  Consult/Referral To [place] responded to [facility] consult requesting prayer for patient. Chaplain provided compassionate presence and conversation although patient appears to be quite sleepy and forgetful. Chaplain prayed with patient.

## 2022-02-10 NOTE — Assessment & Plan Note (Signed)
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 Phos this AM 2.2, --Replacement with PO K phos neutral today --Repeat phos level with AM labs

## 2022-02-10 NOTE — Progress Notes (Signed)
Daily Progress Note   Patient Name: Earl Moreno       Date: 02/10/2022 DOB: Jul 18, 1965  Age: 56 y.o. MRN#: [id number] Attending Physician: Pennie Banter, DO Primary Care Physician: Pcp, No Admit Date: 01/21/2022  Reason for Consultation/Follow-up: Establishing goals of care  Subjective: Patient initially sleeping when I walked in the room but woke up to his sister's voice.  He tells [place] he feels unwell but cannot specify.  He is quite emotional and tearful during her time with him and continues to state "I want my mommy".  Asking his family for a cigarette.When I assessed his mental status he tells me he is at a doctor's office -I explained he is in the hospital but he is unable to tell me why he is in the hospital.  He does not answer questions about time.  Continues to cry off-and-on and ask for his mother.  Length of Stay: 19  Current Medications: Scheduled Meds:   acetaminophen  975 mg Per Tube Q6H   Chlorhexidine Gluconate Cloth  6 each Topical Daily   enoxaparin (LOVENOX) injection  40 mg Subcutaneous Q24H   feeding supplement  237 mL Oral TID BM   feeding supplement (PROSource TF20)  60 mL Per Tube BID   Ferrous Fumarate  1 tablet Oral BID   folic acid  1 mg Per Tube Daily   free water  200 mL Per Tube Q6H   insulin aspart  0-15 Units Subcutaneous TID WC   insulin aspart  0-5 Units Subcutaneous QHS   methadone  20 mg Per Tube Daily   multivitamin with minerals  1 tablet Per Tube Daily   mouth rinse  15 mL Mouth Rinse 4 times per day   pantoprazole  40 mg Per Tube Daily   polyethylene glycol  17 g Per Tube Daily   senna  1 tablet Per Tube BID    Continuous Infusions:  sodium chloride 10 mL/hr at 02/09/22 0600   sodium chloride 100 mL/hr at 02/09/22 1241   feeding supplement  (OSMOLITE 1.5 CAL) 1,000 mL (02/10/22 0625)   meropenem (MERREM) IV 1 g (02/10/22 5621)    PRN Meds: sodium chloride, bisacodyl, docusate, haloperidol lactate, ipratropium-albuterol, mouth rinse, sodium phosphate, traZODone  Physical Exam Constitutional:      General: He is not in acute distress.    Appearance: He is ill-appearing.  Pulmonary:     Effort: Pulmonary effort is normal.  Skin:    General: Skin is warm and dry.  Neurological:     Mental Status: He is alert.     Comments: confused             Vital Signs: BP 131/70 (BP Location: Left Arm)   Pulse 81   Temp 99 F (37.2 C) (Oral)   Resp 20   Wt 52.2 kg   SpO2 97%   BMI 17.44 kg/m  SpO2: SpO2: 97 % O2 Device: O2 Device: Room Air O2 Flow Rate:    Intake/output summary:  Intake/Output Summary (Last 24 hours) at 02/10/2022 1019 Last data filed at 02/10/2022 0300 Gross per 24 hour  Intake 2635.81 ml  Output 300 ml  Net 2335.81 ml  LBM: Last BM Date : 02/09/22 Baseline Weight: Weight: 53.2 kg Most recent weight: Weight: 52.2 kg       Palliative Assessment/Data: PPS 40%    Flowsheet Rows    Flowsheet Row Most Recent Value  Intake Tab   Referral Department Hospitalist  Unit at Time of Referral Intermediate Care Unit  Palliative Care Primary Diagnosis Sepsis/Infectious Disease  Date Notified 01/25/22  Palliative Care Type New Palliative care  Reason for referral Clarify Goals of Care  Date of Admission 01/21/22  Date first seen by Palliative Care 01/26/22  # of days Palliative referral response time 1 Day(s)  # of days IP prior to Palliative referral 4  Clinical Assessment   Palliative Performance Scale Score 40%  Pain Max last 24 hours Not able to report  Pain Min Last 24 hours Not able to report  Dyspnea Max Last 24 Hours Not able to report  Dyspnea Min Last 24 hours Not able to report  Psychosocial & Spiritual Assessment   Palliative Care Outcomes        Patient Active Problem List    Diagnosis Date Noted   Intracranial abscess    Ischemic cerebrovascular accident (CVA) (HCC) 02/08/2022   Normocytic anemia 01/27/2022   Thrombocytopenia (HCC) 01/27/2022   Osteomyelitis (HCC)    Aortic valve endocarditis 01/24/2022   Acute HFrEF (heart failure with reduced ejection fraction) (HCC) 01/24/2022   Septic shock (HCC) 01/22/2022   Altered mental status    Bacteremia    Hypokalemia 01/15/2022   Pelvic mass 01/15/2022   Behavioral problem 01/15/2022   Abscess of multiple sites    Protein-calorie malnutrition, severe 02/11/2020   Type 2 diabetes mellitus (HCC) 02/09/2020   Hyperglycemia due to diabetes mellitus (HCC) 02/09/2020   Abscess 02/09/2020   Polysubstance (including opioids) dependence with physiol dependence (HCC) 02/09/2020   Hyponatremia 02/09/2020   Essential hypertension 02/09/2020   Cellulitis of left hand 06/02/2018    Palliative Care Assessment & Plan   HPI: 56 y.o. male  with past medical history of multiple admissions for sepsis including pyelonephritis, GN bacteremia and suspected osteomyelitis in his right hip as recently as 01/15/2022 with multiple attempts to treat with IV antibiotics but patient continues to leave AMA, history of heroin abuse, DM 2, splenic infarct, admitted on 01/21/2022 with septic shock initially requiring intubation/ventilation.   Assessment: Family meeting to include patient's Sister Bjorn Loser as well as a cousin and an aunt.  I introduced Palliative Medicine as specialized medical care for people living with serious illness. It focuses on providing relief from the symptoms and stress of a serious illness. The goal is to improve quality of life for both the patient and the family.   We discussed patient's current illness and what it means in the larger context of patient's on-going co-morbidities.  Natural disease trajectory and expectations at EOL were discussed.  We discussed patient's aortic valve endocarditis and treatment with  IV antibiotics.  We discussed his CVA due to septic emboli and intracranial abscess.  We discussed ongoing AMS.  We discussed severe aortic regurgitation found on echo.  We discussed CVTS feels that he is too high risk for surgical intervention.  I attempted to elicit values and goals of care important to the patient.  Family tells me patient has been telling them he is ready to see his mother who passed away several months ago.  The difference between aggressive medical intervention and comfort care was considered in light of the patient's goals of care.  Family members expressed differing views on how to proceed with his care.  Sister seems to indicate transition to comfort measures would be in line with patient's goals of care however she does request time to speak to other family members before making a decision.  Discussed with family the importance of continued conversation with family and the medical providers regarding overall plan of care and treatment options, ensuring decisions are within the context of the patient's values and GOCs.    Hospice and Palliative Care services outpatient were explained and offered.  We discussed the option of hospice facility and transitioning to fully focus on his comfort.  Family seems open to this option but again asked for time to discuss with other family members prior to making any decisions.  Questions and concerns were addressed. The family was encouraged to call with questions or concerns.   Recommendations/Plan: Meeting with sister and other family members this a.m. to discuss consideration of comfort measures -family asks for time to consider their options and discuss with other family members prior to making any decisions -they are aware of poor prognosis and lack of good treatment options moving forward Sister has my number and will call if they come to a decision today otherwise she plans to follow-up with attending physician over the  weekend Please refer to palliative note from 9/21 about appropriate decision maker -apparently patient's sons are unable to participate in decision-making process as reported by patient's sister and patient is not legally married therefore sister is next of kin and legal decision maker  Code Status: DNR  Care plan was discussed with patient's sister [name] and other family members and Dr. Arbutus Ped, also discussed with Caryl Asp, RN  Thank you for allowing the Palliative Medicine Team to assist in the care of this patient.   *Please note that this is a verbal dictation therefore any spelling or grammatical errors are due to the "[place]" system interpretation.  Juel Burrow, DNP, [facility] Palliative Medicine Team Team Phone # [phone]  Pager [phone]

## 2022-02-10 NOTE — Progress Notes (Signed)
Pt just finished eating the other half of his hamburger and is now eating [employer].

## 2022-02-10 NOTE — Progress Notes (Signed)
Progress Note   Patient: Earl Moreno VEL:[id number] DOB: 03/22/1966 DOA: 01/21/2022     19 DOS: the patient was seen and examined on 02/10/2022   Brief hospital course: ICU transfer Following summary taken from prior notes. 56 yo M with history of IVDA, recurrent hospitalization and leaving AMA, presenting to [facility] ED on 01/21/22 via EMS complaining that he does not feel well.   Per chart review patient with multiple admissions for sepsis including pyelonephritis, GN bacteremia & suspected osteomyelitis in his right hip as recently as 01/15/22. There have been multiple attempts to treat with IV antibiotics & perform a TEE and rule out endocarditis but the patient continues to leave AMA.  On arrival to ED patient was febrile with leukocytosis, tachycardic, tachypneic, mild hyponatremia, lactic acidosis and elevated procalcitonin.  COVID-19 and influenza PCR was negative.  UDS positive for cocaine and opioids. ... Due to pt being very combative and agitated despite giving multiple doses of Ativan and Haldol, he was started on Precedex and maxed out without much change, resulted in intubation and mechanical ventilation.  Extubated on 01/25/2022.  Patient was found to have Serratia bacteremia with aortic valve endocarditis, infectious disease was consulted and patient is currently on dual coverage with cefepime and ciprofloxacin.  Repeat blood cultures on 01/26/2022 negative.  Patient was also evaluated by [facility] cardiothor[facility] surgery and deemed not a candidate for surgical intervention.  Psych was also consulted for opioid withdrawal and he was started on methadone on 01/26/2022 at 40 mg daily-dose will be titrated by psych to keep him comfortable so he will not leave another AMA.  Palliative care was also consulted and he was made DNR with current level of care and treat the treatable.  They continue to follow for goal of care discussion given overall poor prognosis given extent of his infection and  complications.  TRH to resume care from 01/27/2022.  9/12 and 9/20: UDS was checked at sister's request, suspecting pt's son giving him some drugs.  UDS only positive for methadone which he is taking it here.  No other concerns.  9/18 patient sustained a mechanical fall while trying to get out of bed. He attempted to go the restroom to urinate.   9/20 - repeated MRI brain given his persistent encephalopathy and lethargy, as initial showed subacute infarcts.  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  Reaching back out to CT surgery -- surgery not an option due to extremely high risk of hemorrhagic stroke given brain findings.   9/20: started on NG tube feeds  Patient will need prolonged hospitalization to complete IV antibiotic course (March 10, 2022) secondary to his history of IV drug use.  Remains very high risk for deterioration and death.   Assessment and Plan: * Septic shock (HCC) Patient initially admitted with severe sepsis and septic shock secondary to Serratia bacteremia and aortic valve endocarditis with his history of IV drug abuse. Multiple recent infections including pyelonephritis and osteomyelitis which were not treated adequately as he was keeps leaving AMA. Patient was intubated and required pressors in the beginning.  Extubated on 01/25/2022 and was weaned off from pressors. Blood pressure still on softer side. Repeat blood cultures on 9/7 remain negative in 24-hour. ID is on board. -Continue antibiotics per ID -On meropenem (previously cefepime, cipro) -Patient will need  long-term antibiotics and prolonged hospitalization.  Ischemic cerebrovascular accident (CVA) ([place]) Due to septic emboli. MRI brain on 01/22/22 showed Patchy small volume areas of diffusion  signal abnormality with associated postcontrast enhancement involving the right greater than left parieto-occipital regions. Findings felt to be most consistent with evolving subacute ischemic infarcts. Associated mild petechial hemorrhage without significant regional mass effect. Short interval follow-up MRI suggested to ensure that these changes resolve, and that no other underlying neoplastic or inflammatory process is present. --9/20 - Ongoing encephalopathy out of proportion to sedating meds used for agitation --Repeat MRI brain ordered -- shows multiple brain abscesses and new septic emboli  Bacteremia Continue antibiotic therapy per ID. Was on Cefepime, Cipro. Now on Merrem.  Aortic valve endocarditis Found to have large aortic valve vegetation with history of Serratia bacteremia and IV drug use. Not a suitable candidate for any surgical intervention according to [facility] cardi[facility]. 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    Now mental status is multifactorial but now likely due to brain abscesses and multiple septic emboli. He had been having intermittent agitation and delirium, requiring Haldol.  Sedating meds have since been held and minimized.  --Continue with as needed haloperidol (avoid if possible0.  --Methadone 20 mg daily --As needed trazodone at night. --Continue sitter one to one observation for safety as needed.      Osteomyelitis ([place]) Right ischial tuberosity osteomyelitis on MRI which is little unusual site for osteo.  No abscess or overlying ulcer.  Positive blood cultures, most likely blood seeding. -On appropriate antibiotics  Essential hypertension Patient was on amlodipine at home.  Admitted with septic shock  requiring pressors. Blood pressure still on softer side-weaned off from pressors.  Continue to hold on antihypertensive medications.   Acute HFrEF (heart failure with reduced ejection fraction) (HCC) EF of 45 to 50% with global hypokinesis.  Decreased from his prior echocardiogram done in July 2023.  Most likely secondary to aortic endocarditis. Clinically appears euvolemic.  No clinical signs of exacerbation.   Polysubstance (including opioids) dependence with physiol dependence ([place]) History of IV opioid use for many years. Psych was consulted for concern of opioid withdrawal and he was started on methadone to prevent another AMA.  Patient on methadone 20 mg daily with good toleration.   Type 2 diabetes mellitus (HCC) Mild hypoglycemia with CBG in 70s.  Fasting glucose this am 104, capillary 80, 232 and 166.  Plan to continue insulin sliding scale for glucose cover and monitoring, continue with basal and pre meal insulin therapy.   Normocytic anemia Most likely secondary to bone marrow suppression with sepsis. Anemia panel shows anemia of chronic disease with some iron deficiency. Continue with oral iron supplementation  Hgb is 7.5 - improved from 7.0 Monitor CBC.  Thrombocytopenia ([place]) Patient developed sudden onset thrombocytopenia to nadir of platelets 43k. HIT panel was negative. DIC labs were negative. Most likely secondary to bone marrow suppression with sepsis.  No obvious bleeding. Platelets improving 110k. Monitor CBC.  Protein-calorie malnutrition, severe Estimated body mass index is 15.67 kg/m as calculated from the following:   Height as of 01/14/22: 5' 8.11" (1.73 m).   Weight as of this encounter: 46.9 kg.   Continue with nutritional supplements.  Started on tube feeds by NG tube 02/08/22. Dietitian following.  Hypophosphatemia May be having refeeding syndrome. Phos this AM 1.7, afternoon 1.6. --IV K-phos ordered --Repeat phos level with AM  labs  Intracranial abscess Discussed with Neurosurgery - see their consult note.  No intervention at this time. Continue IV antibiotics and monitor closely.         Subjective: Patient was awake visiting with sister and family.  He reports feeling well today, at a burger earlier and has another coming for lunch.  He pulled out NG tube earlier.  See separate note regarding goals of care and decision-making conversation had today.     Physical Exam: Vitals:   02/09/22 2321 02/10/22 0432 02/10/22 0642 02/10/22 0908  BP: 129/69 (!) 146/72  131/70  Pulse: 72 95  81  Resp: 18 18    Temp: 99 F (37.2 C)     TempSrc: Oral     SpO2: 100% 91%  97%  Weight:   52.2 kg    General exam: awake, alert, conversational, no acute distress, underweight, frail appearing HEENT: NG tube was removed, moist mucus membranes, hearing grossly normal  Respiratory system: coarse rhonchi, normal respiratory effort. Cardiovascular system: normal S1/S2, RRR, no pedal edema.   Gastrointestinal system: sunken abdomen, non-tender Central nervous system: alert, oriented x 3 and to situation, no focal neurologic deficits, normal speech Extremities: no edema, normal tone Skin: dry, intact, normal temperature Psychiatry: normal mood, congruent affect, judgement and insight appear normal    Data Reviewed: Notable labs --- Na 133, glucose 122, CO2 21, phos 1.6, Cr 0.57,    Family Communication: sister Bjorn Loser and two other family members at bedside today.    Disposition: Status is: Inpatient Remains inpatient appropriate because: IV antibiotic therapy.  Close monitoring of cardiac and neurologic status given infectious complications as outlined.   Planned Discharge Destination: Home     Author: Pennie Banter, DO 02/10/2022 5:11 PM  For on call review [email or web address].

## 2022-02-10 NOTE — Progress Notes (Signed)
Confirmed with Dr. Arbutus Ped ok to leave the NG tube out for the evening. Pt has ate one and a half hamburgers today and drank enough for the day.

## 2022-02-10 NOTE — Assessment & Plan Note (Addendum)
Code status DNR. Palliative care following.

## 2022-02-10 NOTE — IPAL (Signed)
Discussion with patient today regarding his clinical condition and his wishes for goals of care.   Code status DNR - confirmed with patient and family.  Patient is fully alert, oriented to person, place, time, circumstances and grasps the severity of his illness and its potential for life-ending complications.    I had long discussion with patient today without any family members in the room to discuss his prognosis without ability to do surgery on his heart.  Explained that he could very likely pass away from either cardiac or neurologic complications of his infection.    Patient states he wants his family to make decisions for him, and specifically names his sister [name] as his preferred surrogate for decision-making.    Request chaplain to assist with advanced directive paperwork designating [name] as patient's HPOA.

## 2022-02-11 LAB — HEMOGLOBIN AND HEMATOCRIT, BLOOD
HCT: 27 % — ABNORMAL LOW (ref 39.0–52.0)
Hemoglobin: 8.9 g/dL — ABNORMAL LOW (ref 13.0–17.0)

## 2022-02-11 LAB — CBC
HCT: 21.2 % — ABNORMAL LOW (ref 39.0–52.0)
Hemoglobin: 6.7 g/dL — ABNORMAL LOW (ref 13.0–17.0)
MCH: 27.1 pg (ref 26.0–34.0)
MCHC: 31.6 g/dL (ref 30.0–36.0)
MCV: 85.8 fL (ref 80.0–100.0)
Platelets: 96 10*3/uL — ABNORMAL LOW (ref 150–400)
RBC: 2.47 MIL/uL — ABNORMAL LOW (ref 4.22–5.81)
RDW: 15.7 % — ABNORMAL HIGH (ref 11.5–15.5)
WBC: 9.3 10*3/uL (ref 4.0–10.5)
nRBC: 0 % (ref 0.0–0.2)

## 2022-02-11 LAB — BASIC METABOLIC PANEL
Anion gap: 6 (ref 5–15)
BUN: 12 mg/dL (ref 6–20)
CO2: 24 mmol/L (ref 22–32)
Calcium: 7.9 mg/dL — ABNORMAL LOW (ref 8.9–10.3)
Chloride: 102 mmol/L (ref 98–111)
Creatinine, Ser: 0.65 mg/dL (ref 0.61–1.24)
GFR, Estimated: >60 mL/min (ref >60)
Glucose, Bld: 112 mg/dL — ABNORMAL HIGH (ref 70–99)
Potassium: 3.6 mmol/L (ref 3.5–5.1)
Sodium: 132 mmol/L — ABNORMAL LOW (ref 135–145)

## 2022-02-11 LAB — TYPE AND SCREEN

## 2022-02-11 LAB — GLUCOSE, CAPILLARY
Glucose-Capillary: 109 mg/dL — ABNORMAL HIGH (ref 70–99)
Glucose-Capillary: 95 mg/dL (ref 70–99)
Glucose-Capillary: 174 mg/dL — ABNORMAL HIGH (ref 70–99)
Glucose-Capillary: 127 mg/dL — ABNORMAL HIGH (ref 70–99)
Glucose-Capillary: 177 mg/dL — ABNORMAL HIGH (ref 70–99)

## 2022-02-11 LAB — ABO/RH: ABO/RH(D): O POS

## 2022-02-11 LAB — PHOSPHORUS: Phosphorus: 3.2 mg/dL (ref 2.5–4.6)

## 2022-02-11 LAB — PREPARE RBC (CROSSMATCH)

## 2022-02-11 LAB — MAGNESIUM: Magnesium: 1.7 mg/dL (ref 1.7–2.4)

## 2022-02-11 MED ORDER — MAGNESIUM SULFATE 2 GM/50ML IV SOLN
2.0000 g | Freq: Once | INTRAVENOUS | Status: AC
  Administered 2022-02-11: 2 g via INTRAVENOUS
  Filled 2022-02-11: qty 50

## 2022-02-11 MED ORDER — FOLIC ACID 1 MG PO TABS
1.0000 mg | ORAL_TABLET | Freq: Every day | ORAL | Status: DC
  Administered 2022-02-12 – 2022-02-24 (×13): 1 mg via ORAL
  Filled 2022-02-11 (×13): qty 1

## 2022-02-11 MED ORDER — SENNA 8.6 MG PO TABS
1.0000 | ORAL_TABLET | Freq: Two times a day (BID) | ORAL | Status: DC
  Administered 2022-02-11 – 2022-02-24 (×20): 8.6 mg via ORAL
  Filled 2022-02-11 (×23): qty 1

## 2022-02-11 MED ORDER — PANTOPRAZOLE 2 MG/ML SUSPENSION
40.0000 mg | Freq: Every day | ORAL | Status: DC
  Filled 2022-02-11: qty 20

## 2022-02-11 MED ORDER — SODIUM CHLORIDE 0.9% IV SOLUTION: Freq: Once | INTRAVENOUS | Status: DC

## 2022-02-11 MED ORDER — PROSOURCE PLUS PO LIQD
60.0000 mL | Freq: Two times a day (BID) | ORAL | Status: DC
  Administered 2022-02-11 – 2022-02-12 (×2): 60 mL via ORAL
  Filled 2022-02-11: qty 60

## 2022-02-11 MED ORDER — ACETAMINOPHEN 325 MG PO TABS
975.0000 mg | ORAL_TABLET | Freq: Four times a day (QID) | ORAL | Status: DC
  Administered 2022-02-11 – 2022-02-18 (×22): 975 mg via ORAL
  Administered 2022-02-19: 325 mg via ORAL
  Administered 2022-02-19 – 2022-02-22 (×6): 975 mg via ORAL
  Filled 2022-02-11 (×35): qty 3

## 2022-02-11 MED ORDER — DOCUSATE SODIUM 50 MG/5ML PO LIQD: 100.0000 mg | Freq: Two times a day (BID) | ORAL | Status: DC | PRN

## 2022-02-11 MED ORDER — METHADONE HCL 10 MG PO TABS
20.0000 mg | ORAL_TABLET | Freq: Every day | ORAL | Status: DC
  Administered 2022-02-12 – 2022-02-24 (×13): 20 mg via ORAL
  Filled 2022-02-11 (×13): qty 2

## 2022-02-11 MED ORDER — ADULT MULTIVITAMIN W/MINERALS CH
1.0000 | ORAL_TABLET | Freq: Every day | ORAL | Status: DC
  Administered 2022-02-12 – 2022-02-24 (×13): 1 via ORAL
  Filled 2022-02-11 (×13): qty 1

## 2022-02-11 MED ORDER — TRAZODONE HCL 50 MG PO TABS
50.0000 mg | ORAL_TABLET | Freq: Every evening | ORAL | Status: DC | PRN
  Administered 2022-02-11 – 2022-02-18 (×5): 50 mg via ORAL
  Filled 2022-02-11 (×6): qty 1

## 2022-02-11 NOTE — Progress Notes (Signed)
Contacted by CSW Lattie Haw, to help with securing healthcare power of attorney. Patient was awake and alert when I visited. Spoke with nurse and sitter about him having capacity to complete an Scientist, physiological. I stayed present in the room for a significant period of time with the patient alert and talking the entire time but none of his conversation was rational. I am not medically trained nor am I  attempting to diagnose the patient. I am documenting this  for medical staff to see because the notary will ask specific questions to determine if this patient is aware, of this legal document that must be patient driven. Nurse did contact the sister I believe [name]) who will be named as healthcare power of attorney. I explained the document and Monday will be the next possible day to get this completed. I will add a follow up for the on call chaplain for Monday to follow up with this request. The named individual does not have to be present but the document need to be completed which I left in the room.

## 2022-02-11 NOTE — TOC Progression Note (Signed)
Transition of Care [facility]) - Progression Note    Patient Details  Name: Earl Moreno MRN: [id number] Date of Birth: 1965/06/26  [facility] of [facility]) CM/SW Contact  Zigmund Daniel Dorian Pod, RN Phone Number:[phone] 02/11/2022, 3:27 PM  Clinical Narrative:    Requested for on call Chaplin for establishment of the HPOA. RN has contacted the chaplin Helene Kelp [phone]) with this request today if possible. Information provided for consultation on the request. Mable Fill indicated she will visit with pt today to complete this task.  TOC remains available to assist with any ongoing discharge needs.   Expected Discharge Plan: [place] Barriers to Discharge: Continued Medical Work up  Expected Discharge Plan and Services Expected Discharge Plan: [place]   Discharge Planning Services: CM Consult   Living arrangements for the past 2 months: Apartment, Homeless                 DME Arranged: N/A DME Agency: NA                   Social Determinants of Health (SDOH) Interventions    Readmission Risk Interventions    01/23/2022   10:21 AM  Readmission Risk Prevention Plan  Transportation Screening Complete  Medication Review ([place]) Complete  PCP or Specialist appointment within 3-5 days of discharge Complete  HRI or [place] Complete  SW Recovery Care/Counseling Consult Complete  [place] Not Applicable

## 2022-02-11 NOTE — Progress Notes (Signed)
Pt refused lovenox this evening. Explained importance of lovenox and its purpose, pt continued to refuse. Pt irritable but cooperative with assessment.

## 2022-02-11 NOTE — Progress Notes (Signed)
Progress Note   Patient: Earl Moreno DOB: 01-07-1966 DOA: 01/21/2022     20 DOS: the patient was seen and examined on 02/11/2022   Brief hospital course: ICU transfer Following summary taken from prior notes. 56 yo M with history of IVDA, recurrent hospitalization and leaving AMA, presenting to [facility] ED on 01/21/22 via EMS complaining that he does not feel well.   Per chart review patient with multiple admissions for sepsis including pyelonephritis, GN bacteremia & suspected osteomyelitis in his right hip as recently as 01/15/22. There have been multiple attempts to treat with IV antibiotics & perform a TEE and rule out endocarditis but the patient continues to leave AMA.  On arrival to ED patient was febrile with leukocytosis, tachycardic, tachypneic, mild hyponatremia, lactic acidosis and elevated procalcitonin.  COVID-19 and influenza PCR was negative.  UDS positive for cocaine and opioids. ... Due to pt being very combative and agitated despite giving multiple doses of Ativan and Haldol, he was started on Precedex and maxed out without much change, resulted in intubation and mechanical ventilation.  Extubated on 01/25/2022.  Patient was found to have Serratia bacteremia with aortic valve endocarditis, infectious disease was consulted and patient is currently on dual coverage with cefepime and ciprofloxacin.  Repeat blood cultures on 01/26/2022 negative.  Patient was also evaluated by [facility] and deemed not a candidate for surgical intervention.  Psych was also consulted for opioid withdrawal and he was started on methadone on 01/26/2022 at 40 mg daily-dose will be titrated by psych to keep him comfortable so he will not leave another AMA.  Palliative care was also consulted and he was made DNR with current level of care and treat the treatable.  They continue to follow for goal of care discussion given overall poor prognosis given extent of his infection and  complications.  TRH to resume care from 01/27/2022.  9/12 and 9/20: UDS was checked at sister's request, suspecting pt's son giving him some drugs.  UDS only positive for methadone which he is taking it here.  No other concerns.  9/18 patient sustained a mechanical fall while trying to get out of bed. He attempted to go the restroom to urinate.   9/20 - repeated MRI brain given his persistent encephalopathy and lethargy, as initial showed subacute infarcts.  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  Reaching back out to CT surgery -- surgery not an option due to extremely high risk of hemorrhagic stroke given brain findings.   9/20: started on NG tube feeds  Patient will need prolonged hospitalization to complete IV antibiotic course (03/24/2022) secondary to his history of IV drug use.  Remains very high risk for deterioration and death.   Assessment and Plan: * Septic shock ([place]) Patient initially admitted with severe sepsis and septic shock secondary to Serratia bacteremia and aortic valve endocarditis with his history of IV drug abuse. Multiple recent infections including pyelonephritis and osteomyelitis which were not treated adequately as he was keeps leaving AMA. Patient was intubated and required pressors in the beginning.  Extubated on 01/25/2022 and was weaned off from pressors. Blood pressure still on softer side. Repeat blood cultures on 9/7 remain negative in 24-hour. ID is on board. -Continue antibiotics per ID -On meropenem (previously cefepime, cipro) -Patient will need  long-term antibiotics and prolonged hospitalization.  Ischemic cerebrovascular accident (CVA) (HCC) Due to septic emboli. MRI brain on 01/22/22 showed Patchy small volume areas of diffusion  signal abnormality with associated postcontrast enhancement involving the right greater than left parieto-occipital regions. Findings felt to be most consistent with evolving subacute ischemic infarcts. Associated mild petechial hemorrhage without significant regional mass effect. Short interval follow-up MRI suggested to ensure that these changes resolve, and that no other underlying neoplastic or inflammatory process is present. --9/20 - Ongoing encephalopathy out of proportion to sedating meds used for agitation --Repeat MRI brain ordered -- shows multiple brain abscesses and new septic emboli  Bacteremia Continue antibiotic therapy per ID. Was on Cefepime, Cipro. Now on Merrem.  Aortic valve endocarditis Found to have large aortic valve vegetation with history of Serratia bacteremia and IV drug use. Not a suitable candidate for any surgical intervention according to [facility] cardiothoracic surgery. 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    Now mental status is multifactorial but now likely due to brain abscesses and multiple septic emboli. He had been having intermittent agitation and delirium, requiring Haldol.  Sedating meds have since been held and minimized.  --Continue with as needed haloperidol (avoid if possible0.  --Methadone 20 mg daily --As needed trazodone at night. --Continue sitter one to one observation for safety as needed.      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  No abscess or overlying ulcer.  Positive blood cultures, most likely blood seeding. -On appropriate antibiotics  Essential hypertension Patient was on amlodipine at home.  Admitted with septic shock  requiring pressors. Blood pressure still on softer side-weaned off from pressors.  Continue to hold on antihypertensive medications.   Acute HFrEF (heart failure with reduced ejection fraction) (HCC) EF of 45 to 50% with global hypokinesis.  Decreased from his prior echocardiogram done in July 2023.  Most likely secondary to aortic endocarditis. Clinically appears euvolemic.  No clinical signs of exacerbation.   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 Psych was consulted for concern of opioid withdrawal and he was started on methadone to prevent another AMA.  Patient on methadone 20 mg daily with good toleration.   Type 2 diabetes mellitus (HCC) Mild hypoglycemia with CBG in 70s.  Fasting glucose this am 104, capillary 80, 232 and 166.  Plan to continue insulin sliding scale for glucose cover and monitoring, continue with basal and pre meal insulin therapy.   Normocytic anemia Most likely secondary to bone marrow suppression with sepsis. Anemia panel shows anemia of chronic disease with some iron deficiency. --Continue with oral iron supplementation  Hgb is 6.7 this AM - from 7.5. No obvious signs of bleeding --Type & screen --Transfuse 1 unit pRBC's --Post-transfusion H&H --Trend Hbg, transfuse if < 7.0  Thrombocytopenia (HCC) Patient developed sudden onset thrombocytopenia to nadir of platelets 43k. HIT panel was negative. DIC labs were negative. Most likely secondary to bone marrow suppression with sepsis.  No obvious bleeding. Platelets improving 110k. Monitor CBC.  Protein-calorie malnutrition, severe Estimated body mass index is 15.67 kg/m as calculated from the following:   Height as of 01/14/22: 5' 8.11" (1.73 m).   Weight as of this encounter: 46.9  kg.   Continue with nutritional supplements.  Started on tube feeds by NG tube 02/08/22. Dietitian following.   Goals of care,  counseling/discussion Code status DNR. 9/22 -- patient fully alert, oriented to person, place, time, circumstances and grasps the severity of his illness and its potential for life-ending complications.    I had long discussion with patient without any family members in the room.   Patient states he wants his family to make decisions for him, and specifically names his sister Earl Moreno as his preferred surrogate for decision-making.    Hypophosphatemia May be having refeeding syndrome. Phos this AM 1.7, afternoon 1.6. --IV K-phos ordered --Repeat phos level with AM labs  Intracranial abscess Discussed with Neurosurgery - see their consult note.  No intervention at this time. Continue IV antibiotics and monitor closely.         Subjective: Patient was awake sitting up in bed, son Earl Moreno at bedside this AM. Pt reports feeling well.  Sitter at bedside confirms he's been eating well.  Pt denies any acute complaints today.      Physical Exam: Vitals:   02/10/22 1944 02/10/22 2341 02/11/22 0500 02/11/22 0807  BP: 120/72 122/74 118/74 (!) 104/53  Pulse: 82 80 78 73  Resp: 17 18 18 18   Temp: 99 F (37.2 C) 98.8 F (37.1 C) 98.7 F (37.1 C) 98.4 F (36.9 C)  TempSrc: Oral Oral Oral Oral  SpO2: 95% 98% 98% 97%  Weight:       General exam: awake, appears drowsy, conversational, no acute distress, underweight, frail appearing HEENT: wearing glasses, moist mucus membranes, hearing grossly normal  Respiratory system: on room air, normal respiratory effort. Cardiovascular system:  RRR, diastolic murmur noted, no pedal edema.   Gastrointestinal system: sunken abdomen, non-tender Central nervous system: alert, oriented x 3 and to situation, no focal neurologic deficits, normal speech Extremities: no edema, normal tone Skin: pale appearing, dry, intact, normal temperature Psychiatry: normal mood, congruent affect    Data Reviewed: Notable labs --- Na 132, glucose 112, Ca 7.9, Hbg 6.7,  platelets 96k, low phos resolved (3.2), K 3.6, Mg 1.7   Family Communication: sister and two other family members at bedside 9/22. Son 10/22 present at bedside on rounds this AM 9/23.    Disposition: Status is: Inpatient Remains inpatient appropriate because: IV antibiotic therapy.  Close monitoring of cardiac and neurologic status given infectious complications as outlined.   Planned Discharge Destination: Home     Author: 10/23, DO 02/11/2022 11:47 AM  For on call review [email or web address].

## 2022-02-12 LAB — TYPE AND SCREEN
ABO/RH(D): O POS
Antibody Screen: NEGATIVE
Unit division: 0

## 2022-02-12 LAB — BPAM RBC
Blood Product Expiration Date: 202310312359
ISSUE DATE / TIME: 202309231538
Unit Type and Rh: 5100

## 2022-02-12 LAB — GLUCOSE, CAPILLARY
Glucose-Capillary: 135 mg/dL — ABNORMAL HIGH (ref 70–99)
Glucose-Capillary: 175 mg/dL — ABNORMAL HIGH (ref 70–99)

## 2022-02-12 MED ORDER — NICOTINE POLACRILEX 2 MG MT GUM
2.0000 mg | CHEWING_GUM | OROMUCOSAL | Status: DC | PRN
  Administered 2022-02-12 – 2022-02-14 (×5): 2 mg via ORAL
  Filled 2022-02-12 (×6): qty 1

## 2022-02-12 MED ORDER — PANTOPRAZOLE SODIUM 40 MG PO TBEC
40.0000 mg | DELAYED_RELEASE_TABLET | Freq: Every day | ORAL | Status: DC
  Administered 2022-02-12 – 2022-02-24 (×13): 40 mg via ORAL
  Filled 2022-02-12 (×13): qty 1

## 2022-02-12 NOTE — Progress Notes (Signed)
Patient generally pleasant and cooperative thus far in shift; however, he does become irritable very quickly. He is easily re-directed. Mood labile. Patient expresses being upset over not being able to smoke or leave his room. Patient receiving PRN nicotine gum (refused patch). Patient making several calls throughout shift in attempts to get family and friends to bring him cigarettes and a lighter.  Patient does have a history of violent behavior with previous incarceration. No violent behaviors noted this shift.

## 2022-02-12 NOTE — Plan of Care (Signed)
  Problem: Clinical Measurements: Goal: Respiratory complications will improve Outcome: Progressing   Problem: Clinical Measurements: Goal: Cardiovascular complication will be avoided Outcome: Progressing   Problem: Coping: Goal: Level of anxiety will decrease Outcome: Progressing   Problem: Elimination: Goal: Will not experience complications related to bowel motility Outcome: Progressing Goal: Will not experience complications related to urinary retention Outcome: Progressing   Problem: Elimination: Goal: Will not experience complications related to bowel motility Outcome: Progressing Goal: Will not experience complications related to urinary retention Outcome: Progressing   Problem: Pain Managment: Goal: General experience of comfort will improve Outcome: Progressing   Problem: Safety: Goal: Ability to remain free from injury will improve Outcome: Progressing   Problem: Coping: Goal: Ability to adjust to condition or change in health will improve Outcome: Progressing   Problem: Metabolic: Goal: Ability to maintain appropriate glucose levels will improve Outcome: Progressing   Problem: Skin Integrity: Goal: Risk for impaired skin integrity will decrease Outcome: Progressing

## 2022-02-12 NOTE — Plan of Care (Signed)
  Problem: Education: Goal: Knowledge of General Education information will improve Description: Including pain rating scale, medication(s)/side effects and non-pharmacologic comfort measures 02/12/2022 2334 by Lydia Guiles, RN Outcome: Progressing 02/12/2022 2327 by Lydia Guiles, RN Outcome: Progressing   Problem: Clinical Measurements: Goal: Respiratory complications will improve 02/12/2022 2334 by Lydia Guiles, RN Outcome: Progressing 02/12/2022 2327 by Lydia Guiles, RN Outcome: Progressing   Problem: Activity: Goal: Risk for activity intolerance will decrease 02/12/2022 2334 by Lydia Guiles, RN Outcome: Progressing 02/12/2022 2327 by Lydia Guiles, RN Outcome: Progressing   Problem: Nutrition: Goal: Adequate nutrition will be maintained 02/12/2022 2334 by Lydia Guiles, RN Outcome: Progressing 02/12/2022 2327 by Lydia Guiles, RN Outcome: Progressing   Problem: Coping: Goal: Level of anxiety will decrease 02/12/2022 2334 by Lydia Guiles, RN Outcome: Progressing 02/12/2022 2327 by Lydia Guiles, RN Outcome: Progressing   Problem: Elimination: Goal: Will not experience complications related to bowel motility 02/12/2022 2334 by Lydia Guiles, RN Outcome: Progressing 02/12/2022 2327 by Lydia Guiles, RN Outcome: Progressing Goal: Will not experience complications related to urinary retention 02/12/2022 2334 by Lydia Guiles, RN Outcome: Progressing 02/12/2022 2327 by Lydia Guiles, RN Outcome: Progressing   Problem: Pain Managment: Goal: General experience of comfort will improve 02/12/2022 2334 by Lydia Guiles, RN Outcome: Progressing 02/12/2022 2327 by Lydia Guiles, RN Outcome: Progressing   Problem: Safety: Goal: Ability to remain free from injury will improve 02/12/2022 2334 by Lydia Guiles, RN Outcome: Progressing 02/12/2022 2327 by Lydia Guiles, RN Outcome: Progressing

## 2022-02-12 NOTE — Plan of Care (Signed)
  Problem: Education: Goal: Knowledge of General Education information will improve Description: Including pain rating scale, medication(s)/side effects and non-pharmacologic comfort measures Outcome: Progressing   Problem: Health Behavior/Discharge Planning: Goal: Ability to manage health-related needs will improve Outcome: Progressing   Problem: Nutrition: Goal: Adequate nutrition will be maintained Outcome: Progressing   Problem: Activity: Goal: Risk for activity intolerance will decrease Outcome: Progressing   Problem: Coping: Goal: Level of anxiety will decrease Outcome: Progressing   Problem: Elimination: Goal: Will not experience complications related to bowel motility Outcome: Progressing Goal: Will not experience complications related to urinary retention Outcome: Progressing   Problem: Pain Managment: Goal: General experience of comfort will improve Outcome: Progressing   Problem: Safety: Goal: Ability to remain free from injury will improve Outcome: Progressing   Problem: Coping: Goal: Ability to adjust to condition or change in health will improve Outcome: Progressing

## 2022-02-12 NOTE — Progress Notes (Signed)
Patient states he is struggling with wanting a cigarette. Patient continues to refuse nicotine patch. Patient states he "just wants to go home to die." Patient states he feels like his family is just trying to dictate things and he "only signed the HCPOA paperwork to make them happy." Patient tearful stating he used to be the one who took care of everyone and was "so strong" both physically and mentally. Patient states he "just wants to die peacefully at home, not in a facility." Patient does go on to say he and his girlfriend are homeless and have been staying with friends, although she is currently hospitalized in [place]. Patient states his friend Joneen Boers would let him go live there.  Encouraged patient to share his wishes with MD during AM rounds.

## 2022-02-12 NOTE — Progress Notes (Addendum)
Chaplain responded to request by RN to clarify to family members their AD notarization request.  Earl Moreno had noted in chart yesterday that paperwork cannot be completed until tomorrow.  However, the  named HCPOA does not need to be present. This chaplain offered to review paperwork with pt and family present, and they accepted.  Pt moves between joking, friendly, and irritated/oppositional.  He appeared anxious about not being able to smoke and was trying to reach someone to bring him cigarettes.  His sister, Earl Moreno, was cooperative and calm. The sister seemed very familiar with the pt's oppositional behavior, and she appeared ready to chastise him and tell him to "be good"; she did this several times in a non-anxious manner, and the pt tended to acquiesce.     This chaplain asked the pt to state whom he wanted to name as his HCPOA. Pt clearly named sister Earl Moreno  first, and as a Passenger transport manager, his girlfriend.  (Acc. To sister, Earl Moreno is hospitalized in [place] for the "same thing".) Chaplain reviewed and clarified pt's wishes for HCPOA three times.  Chaplain assisted in noting the sister's and GF's information in the AD.  Chaplain offered general education on the LW section, but this part was left blank.  Pt seemed less able to follow the nuances of the LW scenarios and stated that he wants the named HCPOAs to have authority to make decisions for him.    Chaplain reiterated that our office has been advised to prioritize this paperwork on Monday and that the family does not have to be present for notarization.  Sister was appreciative. Please contact for further support before tomorrow.    Minus Liberty, [place] Pager:  [phone]   02/12/22 1500  Clinical Encounter Type  Visited With Patient and family together  Visit Type Initial  Referral From Family  Consult/Referral To Chaplain  Stress Factors  Patient Stress Factors Health changes  Family Stress Factors Family relationships

## 2022-02-12 NOTE — Progress Notes (Signed)
Pt did not have sitter overnight. Pt cooperative during night, no attempts to get out of bed. Bed alarm remained on. Pt remains pleasantly confused, making jokes at times. Can be very forgetful of date/time. Speech is clear, pt speaks often. Endorses headache, has scheduled tylenol, falls asleep frequently. Requests more pain medicine as he falls asleep. Pt given ice pack to aid with headache.

## 2022-02-12 NOTE — Progress Notes (Signed)
Progress Note   Patient: Earl Moreno DOB: August 29, 1965 DOA: 01/21/2022     21 DOS: the patient was seen and examined on 02/12/2022   Brief hospital course: ICU transfer Following summary taken from prior notes. 56 yo M with history of IVDA, recurrent hospitalization and leaving AMA, presenting to [facility] ED on 01/21/22 via EMS complaining that he does not feel well.   Per chart review patient with multiple admissions for sepsis including pyelonephritis, GN bacteremia & suspected osteomyelitis in his right hip as recently as 01/15/22. There have been multiple attempts to treat with IV antibiotics & perform a TEE and rule out endocarditis but the patient continues to leave AMA.  On arrival to ED patient was febrile with leukocytosis, tachycardic, tachypneic, mild hyponatremia, lactic acidosis and elevated procalcitonin.  COVID-19 and influenza PCR was negative.  UDS positive for cocaine and opioids. ... Due to pt being very combative and agitated despite giving multiple doses of Ativan and Haldol, he was started on Precedex and maxed out without much change, resulted in intubation and mechanical ventilation.  Extubated on 01/25/2022.  Patient was found to have Serratia bacteremia with aortic valve endocarditis, infectious disease was consulted and patient is currently on dual coverage with cefepime and ciprofloxacin.  Repeat blood cultures on 01/26/2022 negative.  Patient was also evaluated by [facility] and deemed not a candidate for surgical intervention.  Psych was also consulted for opioid withdrawal and he was started on methadone on 01/26/2022 at 40 mg daily-dose will be titrated by psych to keep him comfortable so he will not leave another AMA.  Palliative care was also consulted and he was made DNR with current level of care and treat the treatable.  They continue to follow for goal of care discussion given overall poor prognosis given extent of his infection and  complications.  TRH to resume care from 01/27/2022.  9/12 and 9/20: UDS was checked at sister's request, suspecting pt's son giving him some drugs.  UDS only positive for methadone which he is taking it here.  No other concerns.  9/18 patient sustained a mechanical fall while trying to get out of bed. He attempted to go the restroom to urinate.   9/20 - repeated MRI brain given his persistent encephalopathy and lethargy, as initial showed subacute infarcts.  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  Reaching back out to CT surgery -- surgery not an option due to extremely high risk of hemorrhagic stroke given brain findings.   9/20: started on NG tube feeds  Patient will need prolonged hospitalization to complete IV antibiotic course (03-17-22) secondary to his history of IV drug use.  Remains very high risk for deterioration and death.   Assessment and Plan: * Septic shock ([place]) Patient initially admitted with severe sepsis and septic shock secondary to Serratia bacteremia and aortic valve endocarditis with his history of IV drug abuse. Multiple recent infections including pyelonephritis and osteomyelitis which were not treated adequately as he was keeps leaving AMA. Patient was intubated and required pressors in the beginning.  Extubated on 01/25/2022 and was weaned off from pressors. Blood pressure still on softer side. Repeat blood cultures on 9/7 remain negative in 24-hour. ID is on board. -Continue antibiotics per ID -On meropenem (previously cefepime, cipro) -Patient will need  long-term antibiotics and prolonged hospitalization.  Ischemic cerebrovascular accident (CVA) (HCC) Due to septic emboli. MRI brain on 01/22/22 showed Patchy small volume areas of diffusion  signal abnormality with associated postcontrast enhancement involving the right greater than left parieto-occipital regions. Findings felt to be most consistent with evolving subacute ischemic infarcts. Associated mild petechial hemorrhage without significant regional mass effect. Short interval follow-up MRI suggested to ensure that these changes resolve, and that no other underlying neoplastic or inflammatory process is present. --9/20 - Ongoing encephalopathy out of proportion to sedating meds used for agitation --Repeat MRI brain ordered -- shows multiple brain abscesses and new septic emboli  Bacteremia Continue antibiotic therapy per ID. Was on Cefepime, Cipro. Now on Merrem.  Aortic valve endocarditis Found to have large aortic valve vegetation with history of Serratia bacteremia and IV drug use. Not a suitable candidate for any surgical intervention according to [facility] cardiothoracic surgery. 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  See Dr. Lucilla Lame note.  Surgery contraindicated due to very high risk of hemorrhagic stroke given MRI findings.  Altered mental status Initially was admitted with toxic metabolic encephalopathy secondary to sepsis and illicit drug use.    Now mental status is multifactorial but now likely due to brain abscesses and multiple septic emboli. He had been having intermittent agitation and delirium, requiring Haldol.  Sedating meds have since been held and minimized.  --Continue with as needed haloperidol (avoid if possible0.  --Methadone 20 mg daily --As needed trazodone at night. --Continue sitter one to one observation for safety as needed.      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  No abscess or overlying ulcer.  Positive blood cultures, most likely blood  seeding. -On appropriate antibiotics  Essential hypertension Patient was on amlodipine at home.  Admitted with septic shock requiring pressors. Blood pressure still on softer side-weaned off from pressors.  Continue to hold on antihypertensive medications.   Acute HFrEF (heart failure with reduced ejection fraction) (HCC) EF of 45 to 50% with global hypokinesis.  Decreased from his prior echocardiogram done in July 2023.  Most likely secondary to aortic endocarditis. Clinically appears euvolemic.  No clinical signs of exacerbation.   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 Psych was consulted for concern of opioid withdrawal and he was started on methadone to prevent another AMA.  Patient on methadone 20 mg daily with good toleration.   Type 2 diabetes mellitus (HCC) Mild hypoglycemia with CBG in 70s.  Fasting glucose this am 104, capillary 80, 232 and 166.  Plan to continue insulin sliding scale for glucose cover and monitoring, continue with basal and pre meal insulin therapy.   Normocytic anemia Most likely secondary to bone marrow suppression with sepsis. Anemia panel shows anemia of chronic disease with some iron deficiency. --Continue with oral iron supplementation  Transfused 1 unit RBC's on 9/23 for Hgb is 6.7.  No obvious signs of bleeding. Hbg this AM improved to 8.9 --Trend Hbg, transfuse if < 7.0  Thrombocytopenia (HCC) Patient developed sudden onset thrombocytopenia to nadir of platelets 43k. HIT panel was negative. DIC labs were negative. Most likely secondary to bone marrow suppression with sepsis.  No obvious bleeding. Platelets improving 110k. Monitor CBC.  Protein-calorie malnutrition, severe Estimated body mass index is 15.67 kg/m as calculated from  the following:   Height as of 01/14/22: 5' 8.11" (1.73 m).   Weight as of this encounter: 46.9 kg.   Continue with  nutritional supplements.  Started on tube feeds by NG tube 02/08/22. Patient pulled out NG tube. Eating well, consuming nearly 100% of all meals.   Continue encourage PO intake & hydration. Dietitian following.   Goals of care, counseling/discussion Code status DNR. Palliative care following.   Hypophosphatemia May be having refeeding syndrome. Phos this AM 1.7, afternoon 1.6. --IV K-phos ordered --Repeat phos level with AM labs  Intracranial abscess Discussed with Neurosurgery - see their consult note.  No intervention at this time. Continue IV antibiotics and monitor closely.  Repeat MRI brain in about 1 week or if changes in neurologic status.        Subjective: Patient was awake sitting up in bed this AM, no family present at the time.  He reports he feels well, he was finishing breakfast and had eaten all of it.  Reports good appetite and eating well.  Continues to want to go outside to smoke.  No acute events reported.   Physical Exam: Vitals:   02/12/22 0457 02/12/22 0500 02/12/22 0821 02/12/22 1158  BP: (!) 119/58  94/61 (!) 116/56  Pulse: 81  61 68  Resp: 16  17 16   Temp: 99.1 F (37.3 C)  98 F (36.7 C) 97.6 F (36.4 C)  TempSrc:   Oral Oral  SpO2: 97%  99% 99%  Weight:  51.9 kg     General exam: awake, alert, conversational, no acute distress, underweight, frail appearing HEENT: wearing glasses, moist mucus membranes, hearing grossly normal  Respiratory system: lungs clear b/l, on room air, normal respiratory effort. Cardiovascular system:  RRR, diastolic murmur noted, no pedal edema.   Central nervous system: alert, oriented x 3 and to situation, no focal neurologic deficits, normal speech Extremities: no edema, normal tone Skin: pale appearing, dry, intact, normal temperature Psychiatry: normal mood, congruent affect    Data Reviewed: Notable labs --- CBG's at goal.  Hbg 8.9 from 6.7 after transfusion yesterday   Family Communication: None at  bedside on rounds this morning.  Will attempt to call.    Disposition: Status is: Inpatient Remains inpatient appropriate because: IV antibiotic therapy.  Close monitoring of cardiac and neurologic status given infectious complications as outlined.   Planned Discharge Destination: Home     Author: , DO 02/12/2022 2:50 PM  For on call review [email or web address].

## 2022-02-13 DIAGNOSIS — I639 Cerebral infarction, unspecified: Secondary | ICD-10-CM

## 2022-02-13 DIAGNOSIS — R451 Restlessness and agitation: Secondary | ICD-10-CM

## 2022-02-13 LAB — BASIC METABOLIC PANEL
Anion gap: 5 (ref 5–15)
BUN: 10 mg/dL (ref 6–20)
CO2: 25 mmol/L (ref 22–32)
Calcium: 7.4 mg/dL — ABNORMAL LOW (ref 8.9–10.3)
Chloride: 104 mmol/L (ref 98–111)
Creatinine, Ser: 0.55 mg/dL — ABNORMAL LOW (ref 0.61–1.24)
GFR, Estimated: >60 mL/min (ref >60)
Glucose, Bld: 119 mg/dL — ABNORMAL HIGH (ref 70–99)
Potassium: 3.7 mmol/L (ref 3.5–5.1)
Sodium: 134 mmol/L — ABNORMAL LOW (ref 135–145)

## 2022-02-13 LAB — CBC
HCT: 24.3 % — ABNORMAL LOW (ref 39.0–52.0)
Hemoglobin: 8 g/dL — ABNORMAL LOW (ref 13.0–17.0)
MCH: 27.9 pg (ref 26.0–34.0)
MCHC: 32.9 g/dL (ref 30.0–36.0)
MCV: 84.7 fL (ref 80.0–100.0)
Platelets: 104 10*3/uL — ABNORMAL LOW (ref 150–400)
RBC: 2.87 MIL/uL — ABNORMAL LOW (ref 4.22–5.81)
RDW: 15.4 % (ref 11.5–15.5)
WBC: 9.8 10*3/uL (ref 4.0–10.5)
nRBC: 0 % (ref 0.0–0.2)

## 2022-02-13 LAB — MAGNESIUM: Magnesium: 1.9 mg/dL (ref 1.7–2.4)

## 2022-02-13 LAB — PHOSPHORUS: Phosphorus: 2.2 mg/dL — ABNORMAL LOW (ref 2.5–4.6)

## 2022-02-13 MED ORDER — K PHOS MONO-SOD PHOS DI & MONO 155-852-130 MG PO TABS
500.0000 mg | ORAL_TABLET | ORAL | Status: AC
  Administered 2022-02-13 – 2022-02-14 (×4): 500 mg via ORAL
  Filled 2022-02-13 (×4): qty 2

## 2022-02-13 NOTE — Progress Notes (Signed)
Progress Note   Patient: Earl Moreno MBW:[id number] DOB: 1965/11/24 DOA: 01/21/2022     22 DOS: the patient was seen and examined on 02/13/2022   Brief hospital course: ICU transfer Following summary taken from prior notes. 56 yo M with history of IVDA, recurrent hospitalization and leaving AMA, presenting to [facility] ED on 01/21/22 via EMS complaining that he does not feel well.   Per chart review patient with multiple admissions for sepsis including pyelonephritis, GN bacteremia & suspected osteomyelitis in his right hip as recently as 01/15/22. There have been multiple attempts to treat with IV antibiotics & perform a TEE and rule out endocarditis but the patient continues to leave AMA.  On arrival to ED patient was febrile with leukocytosis, tachycardic, tachypneic, mild hyponatremia, lactic acidosis and elevated procalcitonin.  COVID-19 and influenza PCR was negative.  UDS positive for cocaine and opioids. ... Due to pt being very combative and agitated despite giving multiple doses of Ativan and Haldol, he was started on Precedex and maxed out without much change, resulted in intubation and mechanical ventilation.  Extubated on 01/25/2022.  Patient was found to have Serratia bacteremia with aortic valve endocarditis, infectious disease was consulted and patient is currently on dual coverage with cefepime and ciprofloxacin.  Repeat blood cultures on 01/26/2022 negative.  Patient was also evaluated by [facility] and deemed not a candidate for surgical intervention.  Psych was also consulted for opioid withdrawal and he was started on methadone on 01/26/2022 at 40 mg daily-dose will be titrated by psych to keep him comfortable so he will not leave another AMA.  Palliative care was also consulted and he was made DNR with current level of care and treat the treatable.  They continue to follow for goal of care discussion given overall poor prognosis given extent of his infection and  complications.  TRH to resume care from 01/27/2022.  9/12 and 9/20: UDS was checked at sister's request, suspecting pt's son giving him some drugs.  UDS only positive for methadone which he is taking it here.  No other concerns.  9/18 patient sustained a mechanical fall while trying to get out of bed. He attempted to go the restroom to urinate.   9/20 - repeated MRI brain given his persistent encephalopathy and lethargy, as initial showed subacute infarcts.  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  Reaching back out to CT surgery -- surgery not an option due to extremely high risk of hemorrhagic stroke given brain findings.   9/20: started on NG tube feeds  Patient will need prolonged hospitalization to complete IV antibiotic course (March 08, 2022) secondary to his history of IV drug use.  Remains very high risk for deterioration and death.   Assessment and Plan: * Septic shock (HCC) Patient initially admitted with severe sepsis and septic shock secondary to Serratia bacteremia and aortic valve endocarditis with his history of IV drug abuse. Multiple recent infections including pyelonephritis and osteomyelitis which were not treated adequately as he was keeps leaving AMA. Patient was intubated and required pressors in the beginning.  Extubated on 01/25/2022 and was weaned off from pressors. Blood pressure still on softer side. Repeat blood cultures on 9/7 remain negative in 24-hour. ID is on board. -Continue antibiotics per ID -On meropenem (previously cefepime, cipro) -Patient will need  long-term antibiotics and prolonged hospitalization.  Ischemic cerebrovascular accident (CVA) (HCC) Due to septic emboli. MRI brain on 01/22/22 showed Patchy small volume areas of diffusion  signal abnormality with associated postcontrast enhancement involving the right greater than left parieto-occipital regions. Findings felt to be most consistent with evolving subacute ischemic infarcts. Associated mild petechial hemorrhage without significant regional mass effect. Short interval follow-up MRI suggested to ensure that these changes resolve, and that no other underlying neoplastic or inflammatory process is present. --9/20 - Ongoing encephalopathy out of proportion to sedating meds used for agitation --Repeat MRI brain ordered -- shows multiple brain abscesses and new septic emboli  Bacteremia Continue antibiotic therapy per ID. Was on Cefepime, Cipro. Now on Merrem.  Aortic valve endocarditis Found to have large aortic valve vegetation with history of Serratia bacteremia and IV drug use. Not a suitable candidate for any surgical intervention according to [facility] cardiothoracic surgery. 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  See Dr. Lucilla Lame note.  Surgery contraindicated due to very high risk of hemorrhagic stroke given MRI findings.  Altered mental status Initially was admitted with toxic metabolic encephalopathy secondary to sepsis and illicit drug use.    Now mental status is multifactorial but now likely due to brain abscesses and multiple septic emboli. He had been having intermittent agitation and delirium, requiring Haldol.  Sedating meds have since been held and minimized.  --Continue with as needed haloperidol (avoid if possible0.  --Methadone 20 mg daily --As needed trazodone at night. --Continue sitter one to one observation for safety as needed.      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  No abscess or overlying ulcer.  Positive blood cultures, most likely blood  seeding. -On appropriate antibiotics  Essential hypertension Patient was on amlodipine at home.  Admitted with septic shock requiring pressors. Blood pressure still on softer side-weaned off from pressors.  Continue to hold on antihypertensive medications.   Acute HFrEF (heart failure with reduced ejection fraction) (HCC) EF of 45 to 50% with global hypokinesis.  Decreased from his prior echocardiogram done in July 2023.  Most likely secondary to aortic endocarditis. Clinically appears euvolemic.  No clinical signs of exacerbation.   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 Psych was consulted for concern of opioid withdrawal and he was started on methadone to prevent another AMA.  Patient on methadone 20 mg daily with good toleration.   Type 2 diabetes mellitus (HCC) Mild hypoglycemia with CBG in 70s.  Fasting glucose this am 104, capillary 80, 232 and 166.  Plan to continue insulin sliding scale for glucose cover and monitoring, continue with basal and pre meal insulin therapy.   Normocytic anemia Most likely secondary to bone marrow suppression with sepsis. Anemia panel shows anemia of chronic disease with some iron deficiency. --Continue with oral iron supplementation  Transfused 1 unit RBC's on 9/23 for Hgb is 6.7.  No obvious signs of bleeding. Hbg this AM improved to 8.9 --Trend Hbg, transfuse if < 7.0  Thrombocytopenia (HCC) Patient developed sudden onset thrombocytopenia to nadir of platelets 43k. HIT panel was negative. DIC labs were negative. Most likely secondary to bone marrow suppression with sepsis.  No obvious bleeding. Platelets improving 110k. Monitor CBC.  Protein-calorie malnutrition, severe Estimated body mass index is 15.67 kg/m as calculated from  the following:   Height as of 01/14/22: 5' 8.11" (1.73 m).   Weight as of this encounter: 46.9 kg.   Continue with  nutritional supplements.  Started on tube feeds by NG tube 02/08/22. Patient pulled out NG tube. Eating well, consuming nearly 100% of all meals.   Continue encourage PO intake & hydration. Dietitian following.   Goals of care, counseling/discussion Code status DNR. Palliative care following.   Hypophosphatemia May be having refeeding syndrome. Phos this AM 2.2, --Replacement with PO K phos neutral today --Repeat phos level with AM labs  Intracranial abscess Discussed with Neurosurgery - see their consult note.  No intervention at this time. Continue IV antibiotics and monitor closely.  Repeat MRI brain in about 1 week or if changes in neurologic status.        Subjective: Patient was awake sitting up in bed this AM, no family present at the time.  He reports overall feeling well.  Continues to express wanting to go outside to smoke cigarettes.  Declines nicotine patch, does have Nicorette but states it does not help, just wants to smoke.  No other acute complaints at this time.   Physical Exam: Vitals:   02/12/22 2015 02/13/22 0503 02/13/22 0747 02/13/22 1130  BP: 112/67 109/61 110/66 100/73  Pulse: 76 88 87 70  Resp: 18 18 18 20   Temp: 97.9 F (36.6 C) 99 F (37.2 C) 99.6 F (37.6 C) 98.3 F (36.8 C)  TempSrc: Oral Axillary Axillary Oral  SpO2: 97% 100% 99% 98%  Weight:       General exam: awake, alert, conversational, no acute distress, underweight, frail appearing HEENT: moist mucus membranes, hearing grossly normal  Respiratory system: on room air, normal respiratory effort. Cardiovascular system:  RRR, diastolic murmur noted, no pedal edema.   Central nervous system: alert, oriented x 3 and to situation, no focal neurologic deficits, normal speech Extremities: no edema, normal tone Skin: dry, intact, normal temperature, color has improved less pale appearing Psychiatry: normal mood, congruent affect    Data Reviewed: Notable labs --- CBG's at goal.   Sodium 134, glucose 119, creatinine 0.55, Phos 2.2, hemoglobin 8.0 from 8.9, platelets 104 improved   Family Communication: None at bedside on rounds this morning.  Will attempt to call.    Disposition: Status is: Inpatient Remains inpatient appropriate because: IV antibiotic therapy.   Close monitoring of cardiac and neurologic status given infectious complications as outlined.   Planned Discharge Destination: Home     Author: Ezekiel Slocumb, DO 02/13/2022 1:00 PM  For on call review [email or web address].

## 2022-02-13 NOTE — Progress Notes (Signed)
                                                     Palliative Care Progress Note, Assessment & Plan   Patient Name: Earl Moreno       Date: 02/13/2022 DOB: September 08, 1965  Age: 56 y.o. MRN#: [id number] Attending Physician: Ezekiel Slocumb, DO Primary Care Physician: Pcp, No Admit Date: 01/21/2022  Reason for Consultation/Follow-up: Establishing goals of care  Subjective: Patient is lying in bed in no apparent distress.  He acknowledges my presence and is able to make his wishes known.  He is holding the phone and staring at it.  He is alert to self and place but is unable to tell me why he is in the hospital.  No family at bedside.  HPI: 56 y.o. male  with past medical history of multiple admissions for sepsis including pyelonephritis, GN bacteremia and suspected osteomyelitis in his right hip as recently as 01/15/2022 with multiple attempts to treat with IV antibiotics but patient continues to leave AMA, history of heroin abuse, DM 2, splenic infarct, admitted on 01/21/2022 with septic shock initially requiring intubation/ventilation.  Summary of counseling/coordination of care: After reviewing the patient's chart and assessing the patient at bedside, I attempted to speak with patient in regards to current plan of care.  When asked to share his understanding of his current medical situation he said he just wants to eat and go home. I again asked if patient could share with me why he was in the hospital and he said, "[place]". He continued to repeat that he just wanted to eat lunch and go to sleep.   I attempted to speak with patient's sister over the phone.  No answer.  Voicemail was full and I was unable to leave a voicemail.  Counseled with attending Dr. Arbutus Ped in regards to plan of care.  MRI is pending for Wednesday to determine effectiveness of IV  antibiotics.  PMT will continue to follow patient throughout his hospitalization.  Code Status: DNR  Prognosis: Unable to determine  Discharge Planning: To Be Determined  Physical Exam Vitals reviewed.  Constitutional:      Appearance: Normal appearance.  HENT:     Head: Normocephalic.     Mouth/Throat:     Mouth: Mucous membranes are moist.  Eyes:     Pupils: Pupils are equal, round, and reactive to light.  Cardiovascular:     Rate and Rhythm: Normal rate.     Pulses: Normal pulses.  Pulmonary:     Effort: Pulmonary effort is normal.  Abdominal:     Palpations: Abdomen is soft.  Musculoskeletal:        General: Normal range of motion.  Skin:    General: Skin is warm and dry.  Neurological:     Mental Status: He is alert.     Comments: Oriented to self, place             Palliative Assessment/Data: 50%    Total Time 25 minutes  Greater than 50%  of this time was spent counseling and coordinating care related to the above assessment and plan.  Thank you for allowing the Palliative Medicine Team to assist in the care of this patient.  [place] Ilsa Iha, FNP-BC Palliative Medicine Team Team Phone # [phone]

## 2022-02-13 NOTE — Progress Notes (Signed)
   02/13/22 1200  Clinical Encounter Type  Visited With Patient  Visit Type Initial;Follow-up  Referral From Chaplain  Consult/Referral To [place] responded to nurse consult for AD. Chaplain provided compassionate presence and reflective listening as patient spoke about health challenges.Chaplain and patient discussed HCPOA and patient clearly stated he wanted his son [name] to be his HCPOA. But patient later stated he wanted his deceased mother Stanton Kidney to make his health care decisions. Patient spoke about a Mariana Kaufman who helped his mother and he was very grateful for his services. Patient was fixated on a can of [employer] Tomato soup and then opened it and started eating the condensed version right out of the can during our visit. Patient had disordered thinking and did not follow one train of thought. Patient is not appropriate to complete any AD paperwork. Chaplain services are available for follow up as needed.

## 2022-02-13 NOTE — Progress Notes (Signed)
PT Cancellation Note  Patient Details Name: Earl Moreno MRN: [id number] DOB: 02-03-1966   Cancelled Treatment:    Reason Eval/Treat Not Completed:  Patient sleeping on arrival to room. He was holding a half eaten plate of food. Patient wakes up to light touch and reports he wants to finish eating lunch a take a nap. He reports feeling fatigued and ambuated a long distance in hallway with OT earlier. PT to continue with attempts.   Minna Merritts, PT, MPT  Percell Locus 02/13/2022, 2:20 PM

## 2022-02-13 NOTE — Progress Notes (Signed)
Nutrition Follow-up  DOCUMENTATION CODES:   Severe malnutrition in context of social or environmental circumstances  INTERVENTION:   -D/c Prosource plus -Continue Ensure Enlive po TID, each supplement provides 350 kcal and 20 grams of protein.  -MVI with minerals daily  NUTRITION DIAGNOSIS:   Severe Malnutrition related to social / environmental circumstances as evidenced by severe muscle depletion, severe fat depletion.  Ongoing  GOAL:   Patient will meet greater than or equal to 90% of their needs  Progressing   MONITOR:   PO intake, Supplement acceptance, Labs, Weight trends, Skin, I & O's  REASON FOR ASSESSMENT:   Consult Enteral/tube feeding initiation and management  ASSESSMENT:   56 y/o male with h/o DM, substance abuse and HTN who is admitted with severe sepsis with septic shock secondary to osteomyelitis in the R hip and aortic valve endocarditis. MRI brain consistent with evolving subacute ischemic infarcts.  9/7- methadone ordered by psych 9/10- safety sitter initiated 9/16- DNR 9/18- s/p fall when trying to get out of bed 9/20- NGT placed (placement in distal antrum of stomach), TF initiated 9/22- NGT removed  Reviewed I/O's: +240 ml x 24 hours and +9.6 L since 01/30/22  Pt unavailable at time of visit. Attempted to speak with pt via call to hospital room phone, however, unable to reach.   Pt's mental status has improved. Noted that he has been eating well; meal completions 40-100%. Pt is refusing Prosource, but drinking Ensure supplements.   Noted wt has increased over the past week, which is favorable given pt's malnutrition.   Palliative care following for goals of care discussions; considering transition to comfort measures.   Medications reviewed and include folic acid, miralax, senokot, and 0.9% sodium chloride infusion @ 50 ml/hr.   Labs reviewed: Na: 134, Phos: 2.2, CBGS: 135-177 (inpatient orders for glycemic control are none).    Diet  Order:   Diet Order             Diet regular Room service appropriate? Yes; Fluid consistency: Thin  Diet effective now                   EDUCATION NEEDS:   No education needs have been identified at this time  Skin:  Skin Assessment: Skin Integrity Issues: Skin Integrity Issues:: DTI DTI: lt buttocks  Last BM:  02/12/22 (type 5)  Height:   Ht Readings from Last 1 Encounters:  01/14/22 5' 8.11" (1.73 m)    Weight:   Wt Readings from Last 1 Encounters:  02/12/22 51.9 kg    Ideal Body Weight:  72.7 kg  BMI:  Body mass index is 17.34 kg/m.  Estimated Nutritional Needs:   Kcal:  5329-9242  Protein:  100-115 grams  Fluid:  > 1.7 L    Loistine Chance, RD, LDN, [place] Registered Dietitian II Certified Diabetes Care and Education Specialist Please refer to [facility] for RD and/or RD on-call/weekend/after hours pager

## 2022-02-13 NOTE — Progress Notes (Signed)
Occupational Therapy Treatment Patient Details Name: Earl Moreno MRN: [id number] DOB: Jun 30, 1965 Today's Date: 02/13/2022   History of present illness Pt is a 56 y.o. male  with past medical history of multiple admissions for sepsis including pyelonephritis, GN bacteremia and suspected osteomyelitis in his right hip as recently as 01/15/2022 with multiple attempts to treat with IV antibiotics but patient continues to leave AMA, history of heroin abuse, DM 2, splenic infarct, admitted on 01/21/2022 with septic shock initially requiring intubation/ventilation.   OT comments  Patient sitting EOB upon arrival, pleasant, very talkative during session, and able to voice needs. Pt required min A for UB dressing and transfers sit >stand. Pt was able to ambulate ~239ft around nursing station with min guard, holding onto IV pole.  1 LOB observed, and recliner following behind for safety. Pt required 1 rest break during ambulation, but declined sitting. Pt very adamant about going back up to old room, requiring frequent redirection. Once back in room pt was able to transfer to recliner with supervision. Pt left in recliner with chair alarm belt set, call bell in reach, and chaplain in room.    Recommendations for follow up therapy are one component of a multi-disciplinary discharge planning process, led by the attending physician.  Recommendations may be updated based on patient status, additional functional criteria and insurance authorization.    Follow Up Recommendations  Skilled nursing-short term rehab (<3 hours/day)    Assistance Recommended at Discharge Frequent or constant Supervision/Assistance  Patient can return home with the following  Assistance with cooking/housework;Direct supervision/assist for medications management;Direct supervision/assist for financial management;Assist for transportation;A little help with bathing/dressing/bathroom;A little help with walking and/or transfers   Equipment  Recommendations  Other (comment) (Defer to next venue of care.)    Recommendations for Other Services      Precautions / Restrictions Precautions Precautions: Fall Restrictions Weight Bearing Restrictions: No       Mobility Bed Mobility               General bed mobility comments: Pt sitting EOB upon arrival    Transfers Overall transfer level: Needs assistance Equipment used: None Transfers: Sit to/from Stand Sit to Stand: Min assist                 Balance Overall balance assessment: Needs assistance Sitting-balance support: Feet supported, No upper extremity supported Sitting balance-Leahy Scale: Good     Standing balance support: Bilateral upper extremity supported, During functional activity Standing balance-Leahy Scale: Fair                             ADL either performed or assessed with clinical judgement   ADL Overall ADL's : Needs assistance/impaired                 Upper Body Dressing : Minimal assistance Upper Body Dressing Details (indicate cue type and reason): gown                        Extremity/Trunk Assessment Upper Extremity Assessment Upper Extremity Assessment: Generalized weakness   Lower Extremity Assessment Lower Extremity Assessment: Generalized weakness         Cognition Arousal/Alertness: Awake/alert Behavior During Therapy: WFL for tasks assessed/performed Overall Cognitive Status: Impaired/Different from baseline  Pertinent Vitals/ Pain       Pain Assessment Pain Assessment: No/denies pain   Frequency  Min 2X/week        Progress Toward Goals  OT Goals(current goals can now be found in the care plan section)  Progress towards OT goals: Progressing toward goals  Acute Rehab OT Goals OT Goal Formulation: With patient  Plan Frequency remains appropriate;Discharge plan remains appropriate        AM-PAC OT "6 Clicks" Daily Activity     Outcome Measure   Help from another person eating meals?: None Help from another person taking care of personal grooming?: A Lot Help from another person toileting, which includes using toliet, bedpan, or urinal?: A Lot Help from another person bathing (including washing, rinsing, drying)?: A Lot Help from another person to put on and taking off regular upper body clothing?: A Little Help from another person to put on and taking off regular lower body clothing?: A Lot 6 Click Score: 15    End of Session Equipment Utilized During Treatment: Other (comment) (IV pole during ambulation.)  OT Visit Diagnosis: Unsteadiness on feet (R26.81);Muscle weakness (generalized) (M62.81)   Activity Tolerance Patient tolerated treatment well   Patient Left in chair;with chair alarm set;with call bell/phone within reach;Other (comment) (Chair alarm belt set, Chaplain in room.)   Nurse Communication Mobility status        Time: 1010-1042 OT Time Calculation (min): 32 min  Charges: OT General Charges $OT Visit: 1 Visit OT Treatments $Therapeutic Activity: 8-22 mins    Leyla Soliz, OTS 02/13/2022, 2:12 PM

## 2022-02-13 NOTE — Progress Notes (Signed)
RN requested support with sister who had questions regarding Advanced Directive. Chaplain spoke to sister who recognizes with chaplain and RN that Earl Moreno is not competent to sign AD due to confusion. Earl Moreno had periods of awareness during visit where he stated he is comfortable with death and feels supported by his family. He does not have a safe discharge due to being unhoused. Sister is aware he may look better than his body is signally. She is prepared to work with is son for decision making. Chaplain offered [employer] of comfort and prayer. He requested continued spiritual care support.     02/13/22 1800  Clinical Encounter Type  Visited With Patient and family together  Visit Type Follow-up  Referral From Chaplain  Consult/Referral To Chaplain

## 2022-02-14 ENCOUNTER — Inpatient Hospital Stay: Payer: Medicaid Other

## 2022-02-14 LAB — CBC
HCT: 23.7 % — ABNORMAL LOW (ref 39.0–52.0)
Hemoglobin: 7.5 g/dL — ABNORMAL LOW (ref 13.0–17.0)
MCH: 27.7 pg (ref 26.0–34.0)
MCHC: 31.6 g/dL (ref 30.0–36.0)
MCV: 87.5 fL (ref 80.0–100.0)
Platelets: 111 10*3/uL — ABNORMAL LOW (ref 150–400)
RBC: 2.71 MIL/uL — ABNORMAL LOW (ref 4.22–5.81)
RDW: 15.4 % (ref 11.5–15.5)
WBC: 9 10*3/uL (ref 4.0–10.5)
nRBC: 0 % (ref 0.0–0.2)

## 2022-02-14 LAB — PHOSPHORUS: Phosphorus: 2.7 mg/dL (ref 2.5–4.6)

## 2022-02-14 MED ORDER — GADOPICLENOL 0.5 MMOL/ML IV SOLN
5.0000 mL | Freq: Once | INTRAVENOUS | Status: AC | PRN
  Administered 2022-02-14: 5 mL via INTRAVENOUS

## 2022-02-14 NOTE — Progress Notes (Signed)
PT Cancellation Note  Patient Details Name: ELFORD EVILSIZER MRN: [id number] DOB: [birth year]   Cancelled Treatment:    Reason Eval/Treat Not Completed: Other (comment).  Pt deferred therapy stating that nursing would get onto him for getting up.  Pt assured that therapist spoke with nursing and it was ok as long as he had someone to walk with him.  Pt deferred ultimately and nursing notified of interaction.  Will re-attempt as medically appropriate.   Gwenlyn Saran, PT, DPT 02/14/22, 4:11 PM

## 2022-02-14 NOTE — Progress Notes (Signed)
Date of Admission:  01/21/2022     ID: Earl Moreno is a 56 y.o. 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)   Essential hypertension   Protein-calorie malnutrition, severe   Altered mental status   Bacteremia   Aortic valve endocarditis   Acute HFrEF (heart failure with reduced ejection fraction) (HCC)   Osteomyelitis (HCC)   Normocytic anemia   Thrombocytopenia (HCC)   Ischemic cerebrovascular accident (CVA) (HCC)   Intracranial abscess   Hypophosphatemia   Goals of care, counseling/discussion    Subjective: Pt is in bed the whole time  He told me he had PT and walked but as per PT 's note he refused      Medications:   sodium chloride   Intravenous Once   acetaminophen  975 mg Oral Q6H   Chlorhexidine Gluconate Cloth  6 each Topical Daily   enoxaparin (LOVENOX) injection  40 mg Subcutaneous Q24H   feeding supplement  237 mL Oral TID BM   Ferrous Fumarate  1 tablet Oral BID   folic acid  1 mg Oral Daily   methadone  20 mg Oral Daily   multivitamin with minerals  1 tablet Oral Daily   pantoprazole  40 mg Oral Daily   polyethylene glycol  17 g Per Tube Daily   senna  1 tablet Oral BID    Objective: Vital signs in last 24 hours:Patient Vitals for the past 24 hrs:  BP Temp Pulse Resp SpO2 Weight  02/14/22 1624 116/66 99.3 F (37.4 C) 90 16 98 % --  02/14/22 0735 (!) 100/50 98.5 F (36.9 C) 68 16 99 % --  02/14/22 0500 -- -- -- -- -- 55.3 kg  02/14/22 0455 113/66 98.7 F (37.1 C) 82 20 99 % --  02/13/22 2026 (!) 92/56 97.8 F (36.6 C) 61 16 99 % --      PHYSICAL EXAM:  General: sleeping , but wakes up to calling his name and is oriented and responds appropriately Follows commands, moves all extremities emaciated Lungs: b/l air entry Heart: s1s2 Abdomen: Soft, non-tender,not distended. Bowel sounds normal. No  masses   Lab Results Recent Labs    02/13/22 0500 02/14/22 0841  WBC 9.8 9.0  HGB 8.0* 7.5*  HCT 24.3* 23.7*  NA 134*  --   K 3.7  --   CL 104  --   CO2 25  --   BUN 10  --   CREATININE 0.55*  --      Microbiology: 01/22/22 - serratia 01/26/22 BC neg  MRI- Three ring enhancing lesions with edema around in the parietal lobe Multiple areas of microhemorrhages  Assessment/Plan: Encephalopathy secondary to sepsis and infarct - Due to multiple cerebral abscesses from septic emboli of aortic endocarditis. Has a vegetation > 1 cm- high risk for further emboli but he is not a candidate for surgery The antibiotics cefepime and cipro were also likely contributing to the encephalopathy- They were discontinued he is now on  meropenem and has been more alert ?  Serratia bacteremia with aortic valve endocarditis Repeat blood culture neg from 01/26/22 Will need for 6 weeks-- 2022-03-07 On Meropenem currently  Osteomyelitis rt pubic ramus  IVDA  heroin user - now on methadone  20mg - as he is sleeping all the time may consider reducing it to 15 mg   -No resp distress  Anemia   Thrombocytopenia    Previous bacteremias including serratia , enterobacter- not treated because of him leaving AMA ?Discussed the management with patient

## 2022-02-14 NOTE — Progress Notes (Signed)
Progress Note   Patient: Earl Moreno [id number] DOB: 05/18/1966 DOA: 01/21/2022     23 DOS: the patient was seen and examined on 02/14/2022   Brief hospital course: ICU transfer Following summary taken from prior notes. 56 yo M with history of IVDA, recurrent hospitalization and leaving AMA, presenting to [facility] ED on 01/21/22 via EMS complaining that he does not feel well.   Per chart review patient with multiple admissions for sepsis including pyelonephritis, GN bacteremia & suspected osteomyelitis in his right hip as recently as 01/15/22. There have been multiple attempts to treat with IV antibiotics & perform a TEE and rule out endocarditis but the patient continues to leave AMA.  On arrival to ED patient was febrile with leukocytosis, tachycardic, tachypneic, mild hyponatremia, lactic acidosis and elevated procalcitonin.  COVID-19 and influenza PCR was negative.  UDS positive for cocaine and opioids. ... Due to pt being very combative and agitated despite giving multiple doses of Ativan and Haldol, he was started on Precedex and maxed out without much change, resulted in intubation and mechanical ventilation.  Extubated on 01/25/2022.  Patient was found to have Serratia bacteremia with aortic valve endocarditis, infectious disease was consulted and patient was placed on dual coverage with cefepime and ciprofloxacin.  Repeat blood cultures on 01/26/2022 negative. Has since been transitioned to meropenem, with course to extend through 21-Mar-2022.  Patient was also evaluated by [facility] and deemed not a candidate for surgical intervention.  Psych was also consulted for opioid withdrawal and he was started on methadone on 01/26/2022 at 40 mg daily-dose will be titrated by psych to keep him comfortable so he will not leave another AMA.  Palliative care was consulted and he was made DNR with current level of care and treat the treatable.  They continue to follow for goal of care  discussion given overall poor prognosis given extent of his infection and complications.  TRH to resume care from 01/27/2022.  9/12 and 9/20: UDS was checked at sister's request, suspecting pt's son giving him some drugs.  UDS only positive for methadone which he is taking it here.  No other concerns.  9/18 patient sustained a mechanical fall while trying to get out of bed. He attempted to go the restroom to urinate. Head CT negative  9/20 - repeated MRI brain given his persistent encephalopathy and lethargy, as initial MRI showed subacute infarcts.  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  Reached back out to CT surgery -- surgery not an option due to extremely high risk of hemorrhagic stroke given brain findings.    9/20: started on NG tube feeds.  Pt pulled tube out next day.  He has since been eating well.   9/21--26: mental status improved, waxes and wanes but overall he's been alert & oriented, with disordered thinking at times.    Patient will need prolonged hospitalization to complete IV antibiotic course (March 21, 2022) secondary to his history of IV drug use.  Remains very high risk for deterioration and death.   Assessment and Plan: * Septic shock ([place]) Patient initially admitted with severe sepsis and septic shock secondary to Serratia bacteremia and aortic valve endocarditis with his history of IV drug abuse. Multiple recent infections including pyelonephritis and osteomyelitis which were not treated adequately as he was keeps  leaving AMA. Patient was intubated and required pressors in the beginning.  Extubated on 01/25/2022 and was weaned off from pressors. Blood pressure still on softer side. Repeat blood cultures on 9/7 remain negative in 24-hour. ID is on  board. -Continue antibiotics per ID -On meropenem (previously cefepime, cipro) -Patient will need long-term antibiotics and prolonged hospitalization.  Ischemic cerebrovascular accident (CVA) ([place]) Due to septic emboli. MRI brain on 01/22/22 showed Patchy small volume areas of diffusion signal abnormality with associated postcontrast enhancement involving the right greater than left parieto-occipital regions. Findings felt to be most consistent with evolving subacute ischemic infarcts. Associated mild petechial hemorrhage without significant regional mass effect. Short interval follow-up MRI suggested to ensure that these changes resolve, and that no other underlying neoplastic or inflammatory process is present. --9/20 - Ongoing encephalopathy out of proportion to sedating meds used for agitation.  Repeat MRI brain ordered -- showed multiple brain abscesses and new septic emboli.   --Repeat MRI tomorrow per neurosurgery   Bacteremia Continue antibiotic therapy per ID. Was on Cefepime, Cipro. Now on Merrem.  Aortic valve endocarditis Found to have large aortic valve vegetation with history of Serratia bacteremia and IV drug use. Not a suitable candidate for any surgical intervention according to [facility] cardiothoracic surgery. 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  See Dr. Abran Duke note.  Surgery contraindicated due to very high risk of hemorrhagic stroke given MRI findings.  Altered mental status Initially was admitted with toxic metabolic encephalopathy secondary to sepsis and illicit drug use.    Now mental status is multifactorial but now likely due to brain abscesses and multiple septic emboli. He had been having intermittent agitation and delirium, requiring Haldol.  Sedating meds have since been held and minimized.  --Continue with as needed haloperidol (avoid  if possible0.  --Methadone 20 mg daily --As needed trazodone at night. --Continue sitter one to one observation for safety as needed.      Osteomyelitis ([place]) Right ischial tuberosity osteomyelitis on MRI which is little unusual site for osteo.  No abscess or overlying ulcer.  Positive blood cultures, most likely blood seeding. -On appropriate antibiotics  Essential hypertension Patient was on amlodipine at home.  Admitted with septic shock requiring pressors. Blood pressure still on softer side-weaned off from pressors.  Continue to hold on antihypertensive medications.   Acute HFrEF (heart failure with reduced ejection fraction) (HCC) EF of 45 to 50% with global hypokinesis.  Decreased from his prior echocardiogram done in July 2023.  Most likely secondary to aortic endocarditis. Clinically appears euvolemic.  No clinical signs of exacerbation.   Polysubstance (including opioids) dependence with physiol dependence ([place]) History of IV opioid use for many years. Psych was consulted for concern of opioid withdrawal and he was started on methadone to prevent another AMA.  Patient on methadone 20 mg daily with good toleration.   Type 2 diabetes mellitus (HCC) Mild hypoglycemia with CBG in 70s.  Fasting glucose this am 104, capillary 80, 232 and 166.  Plan to continue insulin sliding scale for glucose cover and monitoring, continue with basal and pre meal insulin therapy.   Normocytic anemia Most likely secondary to bone marrow suppression with sepsis.  Anemia panel shows anemia of chronic disease with some iron deficiency. --Continue with oral iron supplementation  Transfused 1 unit RBC's on 9/23 for Hgb is 6.7.  No obvious signs of bleeding. Hbg since transfusion:  8.9 >> 8.0 >> 7.5 --Trend Hbg, transfuse if < 7.0  Thrombocytopenia ([place]) Patient developed sudden onset thrombocytopenia to nadir of platelets 43k. HIT panel was negative. DIC labs were negative. Most likely  secondary to bone marrow suppression with sepsis.  No obvious bleeding. Platelets improving 110k. Monitor CBC.  Protein-calorie malnutrition, severe Estimated body mass index is 15.67 kg/m as calculated from the following:   Height as of 01/14/22: 5' 8.11" (1.73 m).   Weight as of this encounter: 46.9 kg.   Continue with nutritional supplements.  Started on tube feeds by NG tube 02/08/22. Patient pulled out NG tube. Eating well, consuming nearly 100% of all meals.   Continue encourage PO intake & hydration. Dietitian following.   Goals of care, counseling/discussion Code status DNR. Palliative care following.   Hypophosphatemia May be having refeeding syndrome. Phos replaced on 9/25 for phos level 2.2, --Monitor phos & replace PRN  Intracranial abscess Discussed with Neurosurgery - see their consult note.  No intervention at this time. Continue IV antibiotics and monitor closely.  Repeat MRI brain in about 1 week or if changes in neurologic status - ordered for tomorrow 9/27.        Subjective: Patient was asleep hunched over in the bed, woke easily to voice.  His son [name] was in the bathroom when I began speaking with patient.  He is normally quite engaged when I round and he is visiting, but today would not make eye contact, was dismissive and very fidgety.  I heard what sounded like metal utensils before he came of the bathroom, and he was shoving something into his pocket when he came out.  Notified nursing and security was called as there have been concerns of drug use when he visits.  Pt reports feeling okay, continues to want to go outside to smoke.  No other acute complaints.    Physical Exam: Vitals:   02/13/22 2026 02/14/22 0455 02/14/22 0500 02/14/22 0735  BP: (!) 92/56 113/66  (!) 100/50  Pulse: 61 82  68  Resp: 16 20  16   Temp: 97.8 F (36.6 C) 98.7 F (37.1 C)  98.5 F (36.9 C)  TempSrc:      SpO2: 99% 99%  99%  Weight:   55.3 kg    General exam:  Sleeping but woke easily to voice and stayed awake and engaged, no acute distress, underweight, frail appearing HEENT: moist mucus membranes, hearing grossly normal  Respiratory system: CTAB, on room air, normal respiratory effort. Cardiovascular system:  RRR, diastolic murmur noted, no pedal edema.   Central nervous system: alert, oriented x 3 and to situation, no focal neurologic deficits, normal speech Extremities: no edema, normal tone Skin: dry, intact, normal temperature, color has improved less pale appearing Psychiatry: normal mood, congruent affect    Data Reviewed: Notable labs --- CBG's at goal.  Sodium 134, glucose 119, creatinine 0.55, Phos 2.2, hemoglobin 8.0 from 8.9, platelets 104 improved   Family Communication: Son [name] at bedside on rounds this morning.  He was in the patient's bathroom, concern for drug use in the bathroom.  Updated sister [name] by phone this afternoon.    Disposition: Status is: Inpatient Remains inpatient appropriate because: IV antibiotic therapy.   Close monitoring of cardiac and neurologic status given infectious complications as outlined.   Planned Discharge Destination: Home     Author: Ezekiel Slocumb, DO 02/14/2022 1:42 PM  For on call review [email or web address].

## 2022-02-14 NOTE — Progress Notes (Signed)
  Chaplain On-Call received referral from [place], who had met the patient's family last night.  This Chaplain has attempted three visits with the patient: at 1140; 1225; 1525 hours.  The patient remains unresponsive, and no family has been present.  Chaplains are available as requested.  Chaplain Pollyann Samples M.Div., [facility]

## 2022-02-15 MED ORDER — SODIUM CHLORIDE 0.9 % IV SOLN
2.0000 g | Freq: Three times a day (TID) | INTRAVENOUS | Status: DC
  Administered 2022-02-15 – 2022-02-24 (×26): 2 g via INTRAVENOUS
  Filled 2022-02-15 (×28): qty 40

## 2022-02-15 NOTE — Progress Notes (Signed)
Physical Therapy Treatment Patient Details Name: Earl Moreno MRN: [id number] DOB: 07/25/1965 Today's Date: 02/15/2022   History of Present Illness Pt is a 56 y.o. male  with past medical history of multiple admissions for sepsis including pyelonephritis, GN bacteremia and suspected osteomyelitis in his right hip as recently as 01/15/2022 with multiple attempts to treat with IV antibiotics but patient continues to leave AMA, history of heroin abuse, DM 2, splenic infarct, admitted on 01/21/2022 with septic shock initially requiring intubation/ventilation.    PT Comments    Patient was cooperative throughout session. He ambulated a lap in the hallway with rolling walker and only Min guard assistance. Occasional cues for safety with using rolling walker. Min A required for standing, patient patient performed bed mobility with no physical assistance. He was able to follow single step commands today with increased time with increased time for processing. He is making progress with functional independence. Recommend to continue PT to maximize independence.    Recommendations for follow up therapy are one component of a multi-disciplinary discharge planning process, led by the attending physician.  Recommendations may be updated based on patient status, additional functional criteria and insurance authorization.  Follow Up Recommendations  Skilled nursing-short term rehab (<3 hours/day) Can patient physically be transported by private vehicle: Yes   Assistance Recommended at Discharge Frequent or constant Supervision/Assistance  Patient can return home with the following A little help with walking and/or transfers;A little help with bathing/dressing/bathroom;Assistance with cooking/housework;Direct supervision/assist for financial management;Assist for transportation;Direct supervision/assist for medications management   Equipment Recommendations  Rolling walker (2 wheels)    Recommendations for  Other Services       Precautions / Restrictions Precautions Precautions: Fall Restrictions Weight Bearing Restrictions: No     Mobility  Bed Mobility Overal bed mobility: Needs Assistance Bed Mobility: Supine to Sit, Sit to Supine     Supine to sit: Supervision, HOB elevated Sit to supine: Supervision, HOB elevated   General bed mobility comments: cues for ttask initiation    Transfers Overall transfer level: Needs assistance Equipment used: Rolling walker (2 wheels) Transfers: Sit to/from Stand Sit to Stand: Min assist           General transfer comment: lifting assistance required for standing    Ambulation/Gait Ambulation/Gait assistance: Min guard Gait Distance (Feet): 175 Feet Assistive device: Rolling walker (2 wheels) Gait Pattern/deviations: Step-through pattern, Narrow base of support Gait velocity: decreased     General Gait Details: occasional cues for appropriate use of rolling walker as well as navigational cues. no loss of balance with ambulation. Sp02 98% on room air and heart rate in the mid 60's after walking   Stairs             Wheelchair Mobility    Modified Rankin (Stroke Patients Only)       Balance   Sitting-balance support: Feet supported, No upper extremity supported Sitting balance-Leahy Scale: Good     Standing balance support: Bilateral upper extremity supported, Reliant on assistive device for balance Standing balance-Leahy Scale: Fair                              Cognition Arousal/Alertness: Awake/alert Behavior During Therapy: WFL for tasks assessed/performed Overall Cognitive Status: No family/caregiver present to determine baseline cognitive functioning  General Comments: patient was able to follow single step commands with increased time. increased time for processing. able to hold a an appropriate conversation throughout the session         Exercises      General Comments        Pertinent Vitals/Pain Pain Assessment Pain Assessment: No/denies pain    Home Living                          Prior Function            PT Goals (current goals can now be found in the care plan section) Acute Rehab PT Goals Patient Stated Goal: to not die in the hospital PT Goal Formulation: With patient Time For Goal Achievement: 02/23/22 Progress towards PT goals: Progressing toward goals    Frequency    Min 2X/week      PT Plan Current plan remains appropriate    Co-evaluation              AM-PAC PT "6 Clicks" Mobility   Outcome Measure  Help needed turning from your back to your side while in a flat bed without using bedrails?: None Help needed moving from lying on your back to sitting on the side of a flat bed without using bedrails?: None Help needed moving to and from a bed to a chair (including a wheelchair)?: A Little Help needed standing up from a chair using your arms (e.g., wheelchair or bedside chair)?: A Little Help needed to walk in hospital room?: A Little Help needed climbing 3-5 steps with a railing? : A Lot 6 Click Score: 19    End of Session Equipment Utilized During Treatment: Gait belt Activity Tolerance: Patient tolerated treatment well Patient left: in bed;with call bell/phone within reach;with bed alarm set Nurse Communication: Mobility status PT Visit Diagnosis: Muscle weakness (generalized) (M62.81);Difficulty in walking, not elsewhere classified (R26.2);Unsteadiness on feet (R26.81)     Time: 6546-5035 PT Time Calculation (min) (ACUTE ONLY): 33 min  Charges:  $Gait Training: 8-22 mins $Therapeutic Activity: 8-22 mins                     Donna Bernard, PT, MPT    Ina Homes 02/15/2022, 2:33 PM

## 2022-02-15 NOTE — Progress Notes (Signed)
Earl Moreno  WUJ:[id number] DOB: Dec 01, 1965 DOA: 01/21/2022 PCP: Pcp, No  108A/108A-AA  LOS: 24 days   Brief hospital course: ICU transfer Following summary taken from prior notes. 56 yo M with history of IVDA, recurrent hospitalization and leaving AMA, presenting to [facility] ED on 01/21/22 via EMS complaining that he does not feel well.   Per chart review patient with multiple admissions for sepsis including pyelonephritis, GN bacteremia & suspected osteomyelitis in his right hip as recently as 01/15/22. There have been multiple attempts to treat with IV antibiotics & perform a TEE and rule out endocarditis but the patient continues to leave AMA.  On arrival to ED patient was febrile with leukocytosis, tachycardic, tachypneic, mild hyponatremia, lactic acidosis and elevated procalcitonin.  COVID-19 and influenza PCR was negative.  UDS positive for cocaine and opioids. ... Due to pt being very combative and agitated despite giving multiple doses of Ativan and Haldol, he was started on Precedex and maxed out without much change, resulted in intubation and mechanical ventilation.  Extubated on 01/25/2022.  Patient was found to have Serratia bacteremia with aortic valve endocarditis, infectious disease was consulted and patient was placed on dual coverage with cefepime and ciprofloxacin.  Repeat blood cultures on 01/26/2022 negative. Has since been transitioned to meropenem, with course to extend through 03/31/22.  Patient was also evaluated by [facility] and deemed not a candidate for surgical intervention.  Psych was also consulted for opioid withdrawal and he was started on methadone on 01/26/2022 at 40 mg daily-dose will be titrated by psych to keep him comfortable so he will not leave another AMA.  Palliative care was consulted and he was made DNR with current level of care and treat the treatable.  They continue to follow for goal of care discussion given  overall poor prognosis given extent of his infection and complications.  TRH to resume care from 01/27/2022.  9/12 and 9/20: UDS was checked at sister's request, suspecting pt's son giving him some drugs.  UDS only positive for methadone which he is taking it here.  No other concerns.  9/18 patient sustained a mechanical fall while trying to get out of bed. He attempted to go the restroom to urinate. Head CT negative  9/20 - repeated MRI brain given his persistent encephalopathy and lethargy, as initial MRI showed subacute infarcts.  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  Reached back out to CT surgery -- surgery not an option due to extremely high risk of hemorrhagic stroke given brain findings.    9/20: started on NG tube feeds.  Pt pulled tube out next day.  He has since been eating well.   9/21--26: mental status improved, waxes and wanes but overall he's been alert & oriented, with disordered thinking at times.    Patient will need prolonged hospitalization to complete IV antibiotic course (Mar 31, 2022) secondary to his history of IV drug use.  Remains very high risk for deterioration and death.   Assessment & Plan:  * Septic shock ([place]) Patient initially admitted with severe sepsis and septic shock secondary to Serratia bacteremia and aortic valve endocarditis with his history of IV drug abuse. Multiple recent infections including pyelonephritis and osteomyelitis which were not treated adequately as he was keeps leaving AMA. Patient  was intubated and required pressors in the beginning.  Extubated on 01/25/2022 and was weaned off from pressors. --abx per ID  Ischemic cerebrovascular accident (CVA) ([place]) Due to septic emboli. MRI brain on 01/22/22 showed Patchy small  volume areas of diffusion signal abnormality with associated postcontrast enhancement involving the right greater than left parieto-occipital regions. Findings felt to be most consistent with evolving subacute ischemic infarcts. Associated mild petechial hemorrhage without significant regional mass effect. Short interval follow-up MRI suggested to ensure that these changes resolve, and that no other underlying neoplastic or inflammatory process is present. --9/20 - Ongoing encephalopathy out of proportion to sedating meds used for agitation.  Repeat MRI brain ordered -- showed multiple brain abscesses and new septic emboli.   --Repeat MRI 9/26 showed new foci of infarcts  Bacteremia Serratia bacteremia with aortic valve endocarditis ID consulted. Repeat blood culture neg from 01/26/22 Will need for 6 weeks-- Mar 19, 2022 --cont meropenem  Aortic valve endocarditis Found to have large aortic valve vegetation with history of Serratia bacteremia and IV drug use. Not a suitable candidate for any surgical intervention according to [facility] cardiothoracic surgery. 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  See Dr. Abran Duke note.  Surgery contraindicated due to very high risk of hemorrhagic stroke given MRI findings. --Continue with antibiotics per ID  Altered mental status Initially was admitted with toxic metabolic encephalopathy secondary to sepsis and illicit drug use.    Now mental status is multifactorial but now likely due to brain abscesses and multiple septic emboli. He had been having intermittent agitation and delirium, requiring Haldol.  Sedating meds have since been held and minimized.  --Continue with as needed haloperidol (avoid if possible0.  --Methadone 20 mg daily --As needed trazodone at night. --Continue sitter one to one observation for safety as needed.      Drug abuse ([place]) --UDS pos for  cocaine and opioids  Osteomyelitis ([place]) Right ischial tuberosity osteomyelitis on MRI which is little unusual site for osteo.  No abscess or overlying ulcer.  Positive blood cultures, most likely blood seeding. -On appropriate antibiotics  Essential hypertension Patient was on amlodipine at home.  Admitted with septic shock requiring pressors. Blood pressure still on softer side-weaned off from pressors.  Continue to hold on antihypertensive medications.   Acute HFrEF (heart failure with reduced ejection fraction) (HCC) EF of 45 to 50% with global hypokinesis.  Decreased from his prior echocardiogram done in July 2023.  Most likely secondary to aortic endocarditis. Clinically appears euvolemic.  No clinical signs of exacerbation.   Polysubstance (including opioids) dependence with physiol dependence ([place]) History of IV opioid use for many years. Psych was consulted for concern of opioid withdrawal and he was started on methadone to prevent another AMA.  Patient on methadone 20 mg daily with good toleration.   Type 2 diabetes mellitus (HCC) --no need for BG checks and SSI  Normocytic anemia Most likely secondary to bone marrow suppression with sepsis.  Anemia panel shows anemia of chronic disease with some iron deficiency. --Continue with oral iron supplementation  Transfused 1 unit RBC's on 9/23 for Hgb is 6.7.  No obvious signs of bleeding. Hbg since transfusion: 8.9 >> 8.0 >> 7.5 --Trend Hbg, transfuse if < 7.0  Thrombocytopenia (HCC) Patient developed sudden onset thrombocytopenia to nadir of platelets 43k. HIT panel was negative. DIC labs were negative. Most likely secondary to bone marrow suppression with sepsis.  No obvious bleeding.  Platelets improving 110k. Monitor CBC.  Protein-calorie malnutrition, severe Estimated body mass index is 15.67 kg/m as calculated from the following:   Height as of 01/14/22: 5' 8.11" (1.73 m).   Weight as of this encounter: 46.9  kg.   Continue with nutritional supplements.  Started on tube feeds by NG tube 02/08/22. Patient pulled out NG tube. Eating well, consuming nearly 100% of all meals.   Continue encourage PO intake & hydration. Dietitian following.   Goals of care, counseling/discussion Code status DNR. Palliative care following.   Hypophosphatemia May be having refeeding syndrome. Phos replaced on 9/25 for phos level 2.2, --Monitor phos & replace PRN  Cerebral abscess (embolic) Discussed with Neurosurgery - see their consult note.  No intervention at this time.  Repeat MRI brain on 9/26 showed more abscesses Continue IV antibiotics and monitor closely.     DVT prophylaxis: Lovenox SQ Code Status: DNR  Family Communication:  Level of care: Telemetry Medical Dispo:   The patient is from: home Anticipated d/c is to: home Anticipated d/c date is: 03-14-2022 Patient currently is not medically ready to d/c due to: need IV abx   Subjective and Interval History:  No complaint today.     Objective: Vitals:   02/15/22 0500 02/15/22 0822 02/15/22 1624 02/15/22 2005  BP: 106/62 107/60 108/61 120/62  Pulse: 71 69 75 76  Resp: 18 18 18 19   Temp: 98.4 F (36.9 C) 97.9 F (36.6 C) 97.7 F (36.5 C) 98.7 F (37.1 C)  TempSrc:      SpO2: 100% 100% 96% 100%  Weight:        Intake/Output Summary (Last 24 hours) at 02/15/2022 2019 Last data filed at 02/15/2022 0900 Gross per 24 hour  Intake 360 ml  Output 400 ml  Net -40 ml   Filed Weights   02/12/22 0500 02/14/22 0500 02/15/22 0400  Weight: 51.9 kg 55.3 kg 49.4 kg    Examination:   Constitutional: NAD, AAOx3 HEENT: conjunctivae and lids normal, EOMI CV: No cyanosis.   RESP: normal respiratory effort, on RA Neuro: II - XII grossly intact.   Psych: Normal mood and affect.     Data Reviewed: I have personally reviewed labs and imaging studies  Time spent: 50 minutes  Enzo Bi, MD [facility] If 7PM-7AM, please contact  night-coverage 02/15/2022, 8:19 PM

## 2022-02-15 NOTE — Assessment & Plan Note (Signed)
--  UDS pos for cocaine and opioids

## 2022-02-15 NOTE — TOC Progression Note (Addendum)
Transition of Care [facility]) - Progression Note    Patient Details  Name: Earl Moreno MRN: [id number] Date of Birth: [birth year]  [facility] CM/SW Contact  Laurena Slimmer, RN Phone Number: 02/15/2022, 3:55 PM  Clinical Narrative:    Spoke with patient to discuss discharge plan for SNF per therapy recommendation. Patient advised without insurance a SNF would be private pay. Advised it may be possible to arrange [facility] via [facility] via [facility] rotation.   Referral sent to [facility]. Referral denied per Renold Don represenative.    Expected Discharge Plan: [place] Barriers to Discharge: Continued Medical Work up  Expected Discharge Plan and Services Expected Discharge Plan: [place]   Discharge Planning Services: CM Consult   Living arrangements for the past 2 months: Apartment, Homeless                 DME Arranged: N/A DME Agency: NA                   Social Determinants of Health (SDOH) Interventions    Readmission Risk Interventions    01/23/2022   10:21 AM  Readmission Risk Prevention Plan  Transportation Screening Complete  Medication Review ([place]) Complete  PCP or Specialist appointment within 3-5 days of discharge Complete  HRI or [place] Complete  SW Recovery Care/Counseling Consult Complete  [place] Not Applicable

## 2022-02-15 NOTE — Progress Notes (Signed)
Nutrition Follow-up  DOCUMENTATION CODES:   Severe malnutrition in context of social or environmental circumstances  INTERVENTION:   -Continue Ensure Enlive po TID, each supplement provides 350 kcal and 20 grams of protein.  -Continue MVI with minerals daily  NUTRITION DIAGNOSIS:   Severe Malnutrition related to social / environmental circumstances as evidenced by severe muscle depletion, severe fat depletion.  Ongoing  GOAL:   Patient will meet greater than or equal to 90% of their needs  Progressing   MONITOR:   PO intake, Supplement acceptance, Labs, Weight trends, Skin, I & O's  REASON FOR ASSESSMENT:   Consult Enteral/tube feeding initiation and management  ASSESSMENT:   56 y/o male with h/o DM, substance abuse and HTN who is admitted with severe sepsis with septic shock secondary to osteomyelitis in the R hip and aortic valve endocarditis. MRI brain consistent with evolving subacute ischemic infarcts.  9/7- methadone ordered by psych 9/10- safety sitter initiated 9/16- DNR 9/18- s/p fall when trying to get out of bed 9/20- NGT placed (placement in distal antrum of stomach), TF initiated 9/22- NGT removed  Reviewed I/O's: -40 ml x 24 hours and +7.6 L since 02/01/22  UOP: 400 ml x 24 hours   Pt remains on a regular diet with improved oral intake. Noted meal completions 20-100%. Pt is drinking Ensure supplements.   Wt has been stable over the past week.   Palliative care continues to follow for goals of care discussions. Pt to remains in the hospital until 03/30/2022 for completion of IV antibiotics.   Medications reviewed and include folic acid, miralax, and senokot.   Labs reviewed: Na: 134, CBGS: 135-177 (inpatient orders for glycemic control are none).    Diet Order:   Diet Order             Diet regular Room service appropriate? Yes; Fluid consistency: Thin  Diet effective now                   EDUCATION NEEDS:   No education needs have  been identified at this time  Skin:  Skin Assessment: Skin Integrity Issues: Skin Integrity Issues:: DTI DTI: lt buttocks  Last BM:  02/14/22 (type 6)  Height:   Ht Readings from Last 1 Encounters:  01/14/22 5' 8.11" (1.73 m)    Weight:   Wt Readings from Last 1 Encounters:  02/15/22 49.4 kg    Ideal Body Weight:  72.7 kg  BMI:  Body mass index is 16.51 kg/m.  Estimated Nutritional Needs:   Kcal:  6073-7106  Protein:  100-115 grams  Fluid:  > 1.7 L    Loistine Chance, RD, LDN, [place] Registered Dietitian II Certified Diabetes Care and Education Specialist Please refer to [facility] for RD and/or RD on-call/weekend/after hours pager

## 2022-02-15 NOTE — Plan of Care (Signed)
  Problem: Safety: Goal: Non-violent Restraint(s) Outcome: Progressing   Problem: Education: Goal: Knowledge of General Education information will improve Description: Including pain rating scale, medication(s)/side effects and non-pharmacologic comfort measures Outcome: Progressing   Problem: Health Behavior/Discharge Planning: Goal: Ability to manage health-related needs will improve Outcome: Progressing   Problem: Clinical Measurements: Goal: Ability to maintain clinical measurements within normal limits will improve Outcome: Progressing Goal: Will remain free from infection Outcome: Progressing Goal: Diagnostic test results will improve Outcome: Progressing Goal: Respiratory complications will improve Outcome: Progressing Goal: Cardiovascular complication will be avoided Outcome: Progressing   Problem: Activity: Goal: Risk for activity intolerance will decrease Outcome: Progressing   Problem: Nutrition: Goal: Adequate nutrition will be maintained Outcome: Progressing   Problem: Coping: Goal: Level of anxiety will decrease Outcome: Progressing   Problem: Elimination: Goal: Will not experience complications related to bowel motility Outcome: Progressing Goal: Will not experience complications related to urinary retention Outcome: Progressing   Problem: Pain Managment: Goal: General experience of comfort will improve Outcome: Progressing   Problem: Safety: Goal: Ability to remain free from injury will improve Outcome: Progressing   Problem: Skin Integrity: Goal: Risk for impaired skin integrity will decrease Outcome: Progressing   Problem: Education: Goal: Ability to describe self-care measures that may prevent or decrease complications (Diabetes Survival Skills Education) will improve Outcome: Progressing Goal: Individualized Educational Video(s) Outcome: Progressing   Problem: Coping: Goal: Ability to adjust to condition or change in health will  improve Outcome: Progressing   Problem: Fluid Volume: Goal: Ability to maintain a balanced intake and output will improve Outcome: Progressing   Problem: Health Behavior/Discharge Planning: Goal: Ability to identify and utilize available resources and services will improve Outcome: Progressing Goal: Ability to manage health-related needs will improve Outcome: Progressing   Problem: Metabolic: Goal: Ability to maintain appropriate glucose levels will improve Outcome: Progressing   Problem: Nutritional: Goal: Maintenance of adequate nutrition will improve Outcome: Progressing Goal: Progress toward achieving an optimal weight will improve Outcome: Progressing   Problem: Skin Integrity: Goal: Risk for impaired skin integrity will decrease Outcome: Progressing   Problem: Tissue Perfusion: Goal: Adequacy of tissue perfusion will improve Outcome: Progressing   

## 2022-02-15 NOTE — Progress Notes (Signed)
                                                     Palliative Care Progress Note, Assessment & Plan   Patient Name: Earl Moreno       Date: 02/15/2022 DOB: December 26, 1965  Age: 56 y.o. MRN#: [id number] Attending Physician: Enzo Bi, MD Primary Care Physician: Pcp, No Admit Date: 01/21/2022  Reason for Consultation/Follow-up: Establishing goals of care  Subjective: Patient is lying to his right side.  He is resting but is easily awakened.  He acknowledges my presence and is able to make his wishes known.  He reports he wants to just "sleep like a baby now". No family at bedside.   HPI: 56 y.o. male  with past medical history of multiple admissions for sepsis including pyelonephritis, GN bacteremia and suspected osteomyelitis in his right hip as recently as 01/15/2022 with multiple attempts to treat with IV antibiotics but patient continues to leave AMA, history of heroin abuse, DM 2, splenic infarct, admitted on 01/21/2022 with septic shock initially requiring intubation/ventilation.  Neuro and ID were consulted and following in addition to PMT.   Summary of counseling/coordination of care: After reviewing the patient's chart and assessing the patient at bedside, I attempted to discuss goals of care and current medical situation with patient.  He stares at me blankly.  He is unable to engage in complex medical decisions at this time. He asked for more cereal and that I leave him alone.  I counselled with dayshift RN Caryl Pina who shares patient has been compliant with working with PT and has had no issues with pain, agitation, or acute complaints today.   I attempted to speak with patient's sister [name] over the phone. No answer. HIPAA appropriate VM left.   PMT will continue to follow the patient throughout his hospitalization.   Code  Status: DNR  Prognosis: Unable to determine  Discharge Planning: To Be Determined  Physical Exam Vitals reviewed.  Constitutional:      General: He is not in acute distress.    Appearance: He is not toxic-appearing.  HENT:     Head: Normocephalic.     Mouth/Throat:     Mouth: Mucous membranes are moist.  Eyes:     Pupils: Pupils are equal, round, and reactive to light.  Cardiovascular:     Rate and Rhythm: Normal rate.     Pulses: Normal pulses.  Pulmonary:     Effort: Pulmonary effort is normal.  Abdominal:     Palpations: Abdomen is soft.  Musculoskeletal:        General: Normal range of motion.  Skin:    General: Skin is warm and dry.  Neurological:     Mental Status: He is alert.     Comments: Oriented to self and place             Palliative Assessment/Data: 50%    Total Time 25 minutes  Greater than 50%  of this time was spent counseling and coordinating care related to the above assessment and plan.  Thank you for allowing the Palliative Medicine Team to assist in the care of this patient.  [place]asket Ilsa Iha, FNP-BC Palliative Medicine Team Team Phone # [phone]

## 2022-02-15 NOTE — Progress Notes (Signed)
OT Cancellation Note  Patient Details Name: OWEN PRATTE MRN: [id number] DOB: 1966/01/04   Cancelled Treatment:    Reason Eval/Treat Not Completed: Patient declined, no reason specified. Pt supine in bed and is pleasant but reports fatigue and declines therapy this afternoon. Plans for shower tomorrow during OT session.  Darleen Crocker, MS, OTR/L , CBIS ascom [phone]  02/15/22, 3:42 PM

## 2022-02-15 NOTE — Progress Notes (Signed)
   02/15/22 1600  Clinical Encounter Type  Visited With Patient  Visit Type Follow-up  Referral From Chaplain  Consult/Referral To Chaplain   Chaplain followed up on request by on-call Chaplain to facilitate AD. Patient appears to have difficulty comprehending at this time to complete or understand forms.

## 2022-02-16 DIAGNOSIS — F1910 Other psychoactive substance abuse, uncomplicated: Secondary | ICD-10-CM

## 2022-02-16 NOTE — Progress Notes (Signed)
Date of Admission:  01/21/2022     ID: Earl Moreno is a 55 y.o. 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)   Essential hypertension   Protein-calorie malnutrition, severe   Drug abuse (HCC)   Altered mental status   Bacteremia   Aortic valve endocarditis   Acute HFrEF (heart failure with reduced ejection fraction) (HCC)   Osteomyelitis (HCC)   Normocytic anemia   Thrombocytopenia (HCC)   Ischemic cerebrovascular accident (CVA) (HCC)   Cerebral abscess (embolic)   Hypophosphatemia   Goals of care, counseling/discussion    Subjective: Pt is in bed the whole time  Refused PT today somnolent      Medications:   sodium chloride   Intravenous Once   acetaminophen  975 mg Oral Q6H   Chlorhexidine Gluconate Cloth  6 each Topical Daily   enoxaparin (LOVENOX) injection  40 mg Subcutaneous Q24H   feeding supplement  237 mL Oral TID BM   Ferrous Fumarate  1 tablet Oral BID   folic acid  1 mg Oral Daily   methadone  20 mg Oral Daily   multivitamin with minerals  1 tablet Oral Daily   pantoprazole  40 mg Oral Daily   polyethylene glycol  17 g Per Tube Daily   senna  1 tablet Oral BID    Objective: Vital signs in last 24 hours:Patient Vitals for the past 24 hrs:  BP Temp Pulse Resp SpO2  02/16/22 0843 (!) 107/57 99 F (37.2 C) 74 16 100 %  02/16/22 0422 (!) 106/57 98.2 F (36.8 C) 86 17 100 %  02/15/22 2005 120/62 98.7 F (37.1 C) 76 19 100 %      PHYSICAL EXAM:  General:somnolent , but wakes up to calling his name and is oriented and responds appropriately, but does not want to be bothered Follows commands, moves all extremities emaciated Lungs: b/l air entry Heart: s1s2 Abdomen: Soft, non-tender,not distended. Bowel sounds normal. No masses   Lab Results Recent Labs    02/14/22 0841  WBC 9.0  HGB 7.5*  HCT 23.7*      Microbiology: 01/22/22 - serratia 01/26/22 BC neg  MRI- Three ring enhancing lesions with edema around in the parietal lobe Multiple areas of microhemorrhages  Assessment/Plan: Encephalopathy secondary to sepsis and infarct - Due to multiple cerebral abscesses from septic emboli of aortic endocarditis. Has a vegetation > 1 cm- high risk for further emboli but he is not a candidate for surgery The antibiotics cefepime and cipro were also likely contributing to the encephalopathy- They were discontinued he is now on  meropenem and has been more alert ?  Serratia bacteremia with aortic valve endocarditis wiith the vegetation >of 1.8 cm , causing multiple septic emboli to the brain,which is ongoing leading to small abscesses Repeat blood culture neg from 01/26/22 Currently nearing  4 weeks of IV antibiotics..On Meropenem currently The echo repeated on 9/20 after 2/1/2 weeks of antibiotic did not show any significant change in size. High risk for ongoing emboli Will repeat 2 d echo to assess the size of the vegetation next week and then decide on PO Cipro    Osteomyelitis rt pubic ramus  IVDA  heroin user - now on methadone  20mg - as he is sleeping all the time may consider reducing it to 15 mg   -No resp distress   Anemia  Thrombocytopenia    Previous bacteremias including serratia , enterobacter- not treated because of him leaving AMA ?Discussed the management with care team

## 2022-02-16 NOTE — Progress Notes (Signed)
PROGRESS NOTE    Earl Moreno  XTK:[id number] DOB: 10/25/1965 DOA: 01/21/2022 PCP: Pcp, No  108A/108A-AA  LOS: 25 days   Brief hospital course: ICU transfer Following summary taken from prior notes. 56 yo M with history of IVDA, recurrent hospitalization and leaving AMA, presenting to [facility] ED on 01/21/22 via EMS complaining that he does not feel well.   Per chart review patient with multiple admissions for sepsis including pyelonephritis, GN bacteremia & suspected osteomyelitis in his right hip as recently as 01/15/22. There have been multiple attempts to treat with IV antibiotics & perform a TEE and rule out endocarditis but the patient continues to leave AMA.  On arrival to ED patient was febrile with leukocytosis, tachycardic, tachypneic, mild hyponatremia, lactic acidosis and elevated procalcitonin.  COVID-19 and influenza PCR was negative.  UDS positive for cocaine and opioids. ... Due to pt being very combative and agitated despite giving multiple doses of Ativan and Haldol, he was started on Precedex and maxed out without much change, resulted in intubation and mechanical ventilation.  Extubated on 01/25/2022.  Patient was found to have Serratia bacteremia with aortic valve endocarditis, infectious disease was consulted and patient was placed on dual coverage with cefepime and ciprofloxacin.  Repeat blood cultures on 01/26/2022 negative. Has since been transitioned to meropenem, with course to extend through 03/20/2022.  Patient was also evaluated by [facility] cardi[facility] and deemed not a candidate for surgical intervention.  Psych was also consulted for opioid withdrawal and he was started on methadone on 01/26/2022 at 40 mg daily-dose will be titrated by psych to keep him comfortable so he will not leave another AMA.  Palliative care was consulted and he was made DNR with current level of care and treat the treatable.  They continue to follow for goal of care discussion given  overall poor prognosis given extent of his infection and complications.  TRH to resume care from 01/27/2022.  9/12 and 9/20: UDS was checked at sister's request, suspecting pt's son giving him some drugs.  UDS only positive for methadone which he is taking it here.  No other concerns.  9/18 patient sustained a mechanical fall while trying to get out of bed. He attempted to go the restroom to urinate. Head CT negative  9/20 - repeated MRI brain given his persistent encephalopathy and lethargy, as initial MRI showed subacute infarcts.  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  Reached back out to CT surgery -- surgery not an option due to extremely high risk of hemorrhagic stroke given brain findings.    9/20: started on NG tube feeds.  Pt pulled tube out next day.  He has since been eating well.   9/21--26: mental status improved, waxes and wanes but overall he's been alert & oriented, with disordered thinking at times.    Patient will need prolonged hospitalization to complete IV antibiotic course (2022-03-20) secondary to his history of IV drug use.  Remains very high risk for deterioration and death.   Assessment & Plan:  * Septic shock (HCC) Patient initially admitted with severe sepsis and septic shock secondary to Serratia bacteremia and aortic valve endocarditis with his history of IV drug abuse. Multiple recent infections including pyelonephritis and osteomyelitis which were not treated adequately as he was keeps leaving AMA. Patient  was intubated and required pressors in the beginning.  Extubated on 01/25/2022 and was weaned off from pressors. --abx per ID  Ischemic cerebrovascular accident (CVA) ([place]) Due to septic emboli. MRI brain on 01/22/22 showed Patchy small  volume areas of diffusion signal abnormality with associated postcontrast enhancement involving the right greater than left parieto-occipital regions. Findings felt to be most consistent with evolving subacute ischemic infarcts. Associated mild petechial hemorrhage without significant regional mass effect. Short interval follow-up MRI suggested to ensure that these changes resolve, and that no other underlying neoplastic or inflammatory process is present. --9/20 - Ongoing encephalopathy out of proportion to sedating meds used for agitation.  Repeat MRI brain ordered -- showed multiple brain abscesses and new septic emboli.   --Repeat MRI 9/26 showed new foci of infarcts  Bacteremia Serratia bacteremia with aortic valve endocarditis ID consulted. Repeat blood culture neg from 01/26/22 --cont meropenem --plan to transition to Cipro on 10/2, per ID  Aortic valve endocarditis Found to have large aortic valve vegetation with history of Serratia bacteremia and IV drug use. Not a suitable candidate for any surgical intervention according to [facility] cardiothoracic surgery. 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  See Dr. Abran Duke note.  Surgery contraindicated due to very high risk of hemorrhagic stroke given MRI findings. --Continue with antibiotics per ID  Altered mental status Initially was admitted with toxic metabolic encephalopathy secondary to sepsis and illicit drug use.    Now mental status is multifactorial but now likely due to brain abscesses and multiple septic emboli. He had been having intermittent agitation and delirium, requiring Haldol.  Sedating meds have since been held and minimized.  --Continue with as needed haloperidol (avoid if possible0.  --Methadone 20 mg daily --As needed trazodone at night.    Drug abuse ([place]) --UDS pos for cocaine and opioids  Osteomyelitis ([place]) Right ischial  tuberosity osteomyelitis on MRI which is little unusual site for osteo.  No abscess or overlying ulcer.  Positive blood cultures, most likely blood seeding. -On appropriate antibiotics  Essential hypertension Patient was on amlodipine at home.  Admitted with septic shock requiring pressors. Blood pressure still on softer side-weaned off from pressors.  Continue to hold on antihypertensive medications.   Acute HFrEF (heart failure with reduced ejection fraction) (HCC) EF of 45 to 50% with global hypokinesis.  Decreased from his prior echocardiogram done in July 2023.  Most likely secondary to aortic endocarditis. Clinically appears euvolemic.  No clinical signs of exacerbation.   Polysubstance (including opioids) dependence with physiol dependence ([place]) History of IV opioid use for many years. Psych was consulted for concern of opioid withdrawal and he was started on methadone to prevent another AMA.  Patient on methadone 20 mg daily with good toleration.   Type 2 diabetes mellitus (HCC) --no need for BG checks and SSI  Normocytic anemia Most likely secondary to bone marrow suppression with sepsis.  Anemia panel shows anemia of chronic disease with some iron deficiency. --Continue with oral iron supplementation  Transfused 1 unit RBC's on 9/23 for Hgb is 6.7.  No obvious signs of bleeding. Hbg since transfusion: 8.9 >> 8.0 >> 7.5 --Trend Hbg, transfuse if < 7.0  Thrombocytopenia (HCC) Patient developed sudden onset thrombocytopenia to nadir of platelets 43k. HIT panel was negative. DIC labs were negative. Most likely secondary to bone marrow suppression with sepsis.  No obvious bleeding. Platelets improving 110k. Monitor CBC.  Protein-calorie malnutrition, severe  Estimated body mass index is 15.67 kg/m as calculated from the following:   Height as of 01/14/22: 5' 8.11" (1.73 m).   Weight as of this encounter: 46.9 kg.   Continue with nutritional supplements.  Started on  tube feeds by NG tube 02/08/22. Patient pulled out NG tube. Eating well, consuming nearly 100% of all meals.   Continue encourage PO intake & hydration. Dietitian following.   Goals of care, counseling/discussion Code status DNR. Palliative care following.   Hypophosphatemia May be having refeeding syndrome. Phos replaced on 9/25 for phos level 2.2, --Monitor phos & replace PRN  Cerebral abscess (embolic) Discussed with Neurosurgery - see their consult note.  No intervention at this time.  Repeat MRI brain on 9/26 showed more abscesses Continue IV antibiotics and monitor closely.     DVT prophylaxis: Lovenox SQ Code Status: DNR  Family Communication: sister Bjorn Loser updated on the phone today, discussed prognosis Level of care: Telemetry Medical Dispo:   The patient is from: home Anticipated d/c is to: undetermined Anticipated d/c date is: undetermined Patient currently is not medically ready to d/c due to: need IV abx   Subjective and Interval History:  No complaint.     Objective: Vitals:   02/15/22 1624 02/15/22 2005 02/16/22 0422 02/16/22 0843  BP: 108/61 120/62 (!) 106/57 (!) 107/57  Pulse: 75 76 86 74  Resp: 18 19 17 16   Temp: 97.7 F (36.5 C) 98.7 F (37.1 C) 98.2 F (36.8 C) 99 F (37.2 C)  TempSrc:      SpO2: 96% 100% 100% 100%  Weight:        Intake/Output Summary (Last 24 hours) at 02/16/2022 1937 Last data filed at 02/16/2022 1511 Gross per 24 hour  Intake 520 ml  Output 825 ml  Net -305 ml   Filed Weights   02/12/22 0500 02/14/22 0500 02/15/22 0400  Weight: 51.9 kg 55.3 kg 49.4 kg    Examination:   Constitutional: NAD, AAOx3, sitting at edge of bed HEENT: conjunctivae and lids normal, EOMI CV: No cyanosis.   RESP: normal respiratory effort, on RA Neuro: II - XII grossly intact.   Psych: subdued mood and affect.     Data Reviewed: I have personally reviewed labs and imaging studies  Time spent: 35 minutes  02/17/22, MD [facility] If 7PM-7AM, please contact night-coverage 02/16/2022, 7:37 PM

## 2022-02-16 NOTE — TOC Progression Note (Signed)
Transition of Care [facility]) - Progression Note    Patient Details  Name: Earl Moreno MRN: [id number] Date of Birth: December 09, 1965  Transition of Care [facility]) CM/SW Contact  Laurena Slimmer, RN Phone Number: 02/16/2022, 12:24 PM  Clinical Narrative:    Attempt to reach patient's sister Earl Moreno @ [phone] to discuss discharge plan.  No answer. Left a vm.    Expected Discharge Plan: [place] Barriers to Discharge: Continued Medical Work up  Expected Discharge Plan and Services Expected Discharge Plan: [place]   Discharge Planning Services: CM Consult   Living arrangements for the past 2 months: Apartment, Homeless                 DME Arranged: N/A DME Agency: NA                   Social Determinants of Health (SDOH) Interventions    Readmission Risk Interventions    01/23/2022   10:21 AM  Readmission Risk Prevention Plan  Transportation Screening Complete  Medication Review ([place]) Complete  PCP or Specialist appointment within 3-5 days of discharge Complete  HRI or [place] Complete  SW Recovery Care/Counseling Consult Complete  [place] Not Applicable

## 2022-02-16 NOTE — Progress Notes (Signed)
Palliative Care Progress Note, Assessment & Plan   Patient Name: Earl Moreno       Date: 02/16/2022 DOB: 09-02-1965  Age: 56 y.o. MRN#: [id number] Attending Physician: Darlin Priestly, MD Primary Care Physician: Pcp, No Admit Date: 01/21/2022  Reason for Consultation/Follow-up: Establishing goals of care  Subjective: Patient is sitting at edge of bed attempting to eat his breakfast.  He acknowledges my presence and is able to make his wishes.  He says that he just wants to sleep and wants to have a cigarette.  He continues to decline nicotine patch.  No family present at bedside during my visit.  HPI: 56 y.o. male  with past medical history of multiple admissions for sepsis including pyelonephritis, GN bacteremia and suspected osteomyelitis in his right hip as recently as 01/15/2022 with multiple attempts to treat with IV antibiotics but patient continues to leave AMA, history of heroin abuse, DM 2, splenic infarct, admitted on 01/21/2022 with septic shock initially requiring intubation/ventilation.   Neuro and ID were consulted and following in addition to PMT.   Summary of counseling/coordination of care: After reviewing the patient's chart and assessing the patient at bedside, I attempted to gauge patient's understanding of his current medical situation.  When asked why he was here, patient said "I just want a cigarette".  I outlined the patient has had infections in his brain and valve issues with his heart, both contributing to complex medical problems.  He started bleeding blankly and again stated, "I just want a cigarette".  Patient is unable to engage in goals of care/medical decision making discussion at this time.  After my visit with the patient, I spoke with patient's Sister Earl Moreno over the phone.  I  attempted to elicit her understanding of patient's current medical situation.  Earl Moreno shares she is getting mixed messages.    I gave brief medical update, including most recent MRI results.  We reviewed that his functional and nutritional status have mildly improved and that EOL/comfort only measures are not appropriate at this time.   However, we discussed the decline in his cognitive status and his inability to have meaningful/engaging interactions. Reviewed that he continues to ask for a cigarette and just wanted to go home. She shares she wants him to live drug free but is worried that he will start using again once he leaves the hospital. Discussed continuing use of methadone to curb cravings.  Therapeutic silence and active listening provided for Earl Moreno to share her thoughts and emotions regarding patient's current medical situation. Emotional support provided.   Earl Moreno shares she would like to speak with SW in regards to medicaid/POA to see how she can help make decisions for Earl Moreno. TOC made aware. She would also like to speak with attending provider. Dr. Fran Moreno made aware and plans to speak with Earl Moreno today.   I outlined the bigger picture of Earl Moreno's medical issues with Earl Moreno. Reviewed that MRI results reveals that his cognitive status is likely at baseline now, with potential to worsen with abscess and new infarcts, high risk of stroke if CT intervention occurred, and that overall prognosis is poor. She shares she has "bee here before" with Earl Moreno and just wants to keep  him safe/away from drugs.   Disposition if Earl Moreno's main concern at this time. TOC following closely.   Earl Moreno requests an in-person meeting with PMT. PMT provider will meet with [facility] bedside tomorrow, 9/29, at 8:30am to further discuss boundaries of care and [place] for Earl Moreno.   Code Status: DNR  Prognosis: Unable to determine  Discharge Planning: To Be Determined  Physical Exam Vitals reviewed.  Constitutional:       Appearance: Normal appearance.  HENT:     Head: Normocephalic.     Mouth/Throat:     Mouth: Mucous membranes are moist.  Eyes:     Pupils: Pupils are equal, round, and reactive to light.  Cardiovascular:     Rate and Rhythm: Normal rate.     Pulses: Normal pulses.  Pulmonary:     Effort: Pulmonary effort is normal.  Abdominal:     Palpations: Abdomen is soft.  Musculoskeletal:        General: Normal range of motion.  Skin:    General: Skin is warm and dry.  Neurological:     Mental Status: He is alert.     Comments: Oriented to self and place  Psychiatric:        Mood and Affect: Mood normal.        Behavior: Behavior normal.             Palliative Assessment/Data: 60%    Total Time 35 minutes  Greater than 50%  of this time was spent counseling and coordinating care related to the above assessment and plan.  Thank you for allowing the Palliative Medicine Team to assist in the care of this patient.  [place] Ilsa Iha, FNP-BC Palliative Medicine Team Team Phone # [phone]

## 2022-02-16 NOTE — Progress Notes (Signed)
OT Cancellation Note  Patient Details Name: Earl Moreno MRN: [id number] DOB: 01-25-1966   Cancelled Treatment:    Reason Eval/Treat Not Completed: Patient declined, no reason specified. Pt seated on EOB with shirt and no pants on. Pt refusing OT intervention and offer to ambulate and shower this session. He is pleasant but with flat affect and making wishes very clear that he does not want to participate in therapy at this time. Pt refusing gown and pants and bed alarm activated.   Darleen Crocker, [place], OTR/L , CBIS ascom [phone]  02/16/22, 4:03 PM

## 2022-02-17 LAB — BASIC METABOLIC PANEL
Anion gap: 7 (ref 5–15)
BUN: 8 mg/dL (ref 6–20)
CO2: 28 mmol/L (ref 22–32)
Calcium: 7.9 mg/dL — ABNORMAL LOW (ref 8.9–10.3)
Chloride: 96 mmol/L — ABNORMAL LOW (ref 98–111)
Creatinine, Ser: 0.61 mg/dL (ref 0.61–1.24)
GFR, Estimated: >60 mL/min (ref >60)
Glucose, Bld: 161 mg/dL — ABNORMAL HIGH (ref 70–99)
Potassium: 3.8 mmol/L (ref 3.5–5.1)
Sodium: 131 mmol/L — ABNORMAL LOW (ref 135–145)

## 2022-02-17 LAB — CBC
HCT: 23.9 % — ABNORMAL LOW (ref 39.0–52.0)
Hemoglobin: 7.5 g/dL — ABNORMAL LOW (ref 13.0–17.0)
MCH: 27.2 pg (ref 26.0–34.0)
MCHC: 31.4 g/dL (ref 30.0–36.0)
MCV: 86.6 fL (ref 80.0–100.0)
Platelets: 152 10*3/uL (ref 150–400)
RBC: 2.76 MIL/uL — ABNORMAL LOW (ref 4.22–5.81)
RDW: 15.4 % (ref 11.5–15.5)
WBC: 9.3 10*3/uL (ref 4.0–10.5)
nRBC: 0 % (ref 0.0–0.2)

## 2022-02-17 LAB — C-REACTIVE PROTEIN: CRP: 4.7 mg/dL — ABNORMAL HIGH (ref <1.0)

## 2022-02-17 LAB — MAGNESIUM: Magnesium: 2 mg/dL (ref 1.7–2.4)

## 2022-02-17 LAB — SEDIMENTATION RATE: Sed Rate: 70 mm/hr — ABNORMAL HIGH (ref 0–20)

## 2022-02-17 NOTE — Progress Notes (Signed)
       CROSS COVER NOTE  NAME: Earl Moreno MRN: [id number] DOB : 1966/04/24    HPI/Events of Note   Pt refusing night meds He states he is just tired of it wants to be left alone.   Assessment and  Interventions   Assessment: Admitted for septic shock related to bacteremia from endocarditis. Pt refused all meds tonight including antibiotic, lovenox.  Plan: Allow some time then continue to encourage X X

## 2022-02-17 NOTE — Progress Notes (Signed)
Date of Admission:  01/21/2022     ID: Earl Moreno is a 56 y.o. male  Principal Problem:   Septic shock ([place]) Active Problems:   Type 2 diabetes mellitus ([place])   Polysubstance (including opioids) dependence with physiol dependence ([place])   Essential hypertension   Protein-calorie malnutrition, severe   Drug abuse ([place])   Altered mental status   Bacteremia   Aortic valve endocarditis   Acute HFrEF (heart failure with reduced ejection fraction) (HCC)   Osteomyelitis (HCC)   Normocytic anemia   Thrombocytopenia (HCC)   Ischemic cerebrovascular accident (CVA) ([place])   Cerebral abscess (embolic)   Hypophosphatemia   Goals of care, counseling/discussion    Subjective: No change in his staus He sleeps the whole time Not motivated to work with PT      Medications:   sodium chloride   Intravenous Once   acetaminophen  975 mg Oral Q6H   Chlorhexidine Gluconate Cloth  6 each Topical Daily   enoxaparin (LOVENOX) injection  40 mg Subcutaneous Q24H   feeding supplement  237 mL Oral TID BM   Ferrous Fumarate  1 tablet Oral BID   folic acid  1 mg Oral Daily   methadone  20 mg Oral Daily   multivitamin with minerals  1 tablet Oral Daily   pantoprazole  40 mg Oral Daily   polyethylene glycol  17 g Per Tube Daily   senna  1 tablet Oral BID    Objective: Vital signs in last 24 hours:Patient Vitals for the past 24 hrs:  BP Temp Pulse Resp SpO2  02/17/22 0920 (!) 106/58 98.8 F (37.1 C) 83 20 100 %  02/17/22 0458 (!) 99/50 98.6 F (37 C) 78 17 99 %  02/16/22 1959 134/82 98.6 F (37 C) 83 18 98 %      PHYSICAL EXAM:  General:somnolent , does not want to be bothered Follows commands, moves all extremities emaciated Lungs: b/l air entry Heart: s1s2 Abdomen: Soft, non-tender,not distended. Bowel sounds normal. No masses   Lab Results Recent Labs    02/17/22 0530  WBC 9.3  HGB 7.5*  HCT 23.9*  NA 131*  K 3.8  CL 96*  CO2 28  BUN 8  CREATININE 0.61      Microbiology: 01/22/22 - serratia 01/26/22 BC neg  MRI- Three ring enhancing lesions with edema around in the parietal lobe Multiple areas of microhemorrhages  Assessment/Plan: Encephalopathy secondary to sepsis and infarct - Due to multiple cerebral abscesses from septic emboli of aortic endocarditis. Has a vegetation > 1 cm- high risk for further emboli but he is not a candidate for surgery The antibiotics cefepime and cipro were also likely contributing to the encephalopathy- They were discontinued he is now on  meropenem and has been more alert ?  Serratia bacteremia with aortic valve endocarditis wiith the vegetation >of 1.8 cm , causing multiple septic emboli to the brain,which is ongoing leading to small abscesses Repeat blood culture neg from 01/26/22 Currently nearing  4 weeks of IV antibiotics..On Meropenem currently The echo repeated on 9/20 after 2/1/2 weeks of antibiotic did not show any significant change in size. High risk for ongoing emboli Will repeat 2 d echo to assess the size of the vegetation next week and then decide on PO Cipro    Osteomyelitis rt pubic ramus  IVDA  heroin user - now on methadone  20mg - as he is sleeping all the time may consider reducing it to 15 mg   -  No resp distress   Anemia   Thrombocytopenia    Previous bacteremias including serratia , enterobacter- not treated because of him leaving AMA ?Discussed the management with care team

## 2022-02-17 NOTE — Progress Notes (Signed)
PROGRESS NOTE    LAREN WHALING  YQM:[id number] DOB: [birth year] DOA: 01/21/2022 PCP: Pcp, No  108A/108A-AA  LOS: 26 days   Brief hospital course: ICU transfer Following summary taken from prior notes. 56 yo M with history of IVDA, recurrent hospitalization and leaving AMA, presenting to [facility] ED on 01/21/22 via EMS complaining that he does not feel well.   Per chart review patient with multiple admissions for sepsis including pyelonephritis, GN bacteremia & suspected osteomyelitis in his right hip as recently as 01/15/22. There have been multiple attempts to treat with IV antibiotics & perform a TEE and rule out endocarditis but the patient continues to leave AMA.  On arrival to ED patient was febrile with leukocytosis, tachycardic, tachypneic, mild hyponatremia, lactic acidosis and elevated procalcitonin.  COVID-19 and influenza PCR was negative.  UDS positive for cocaine and opioids. ... Due to pt being very combative and agitated despite giving multiple doses of Ativan and Haldol, he was started on Precedex and maxed out without much change, resulted in intubation and mechanical ventilation.  Extubated on 01/25/2022.  Patient was found to have Serratia bacteremia with aortic valve endocarditis, infectious disease was consulted and patient was placed on dual coverage with cefepime and ciprofloxacin.  Repeat blood cultures on 01/26/2022 negative. Has since been transitioned to meropenem, with course to extend through 03/15/2022.  Patient was also evaluated by [facility] cardioth[facility] surgery and deemed not a candidate for surgical intervention.  Psych was also consulted for opioid withdrawal and he was started on methadone on 01/26/2022 at 40 mg daily-dose will be titrated by psych to keep him comfortable so he will not leave another AMA.  Palliative care was consulted and he was made DNR with current level of care and treat the treatable.  They continue to follow for goal of care discussion given  overall poor prognosis given extent of his infection and complications.  TRH to resume care from 01/27/2022.  9/12 and 9/20: UDS was checked at sister's request, suspecting pt's son giving him some drugs.  UDS only positive for methadone which he is taking it here.  No other concerns.  9/18 patient sustained a mechanical fall while trying to get out of bed. He attempted to go the restroom to urinate. Head CT negative  9/20 - repeated MRI brain given his persistent encephalopathy and lethargy, as initial MRI showed subacute infarcts.  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  Reached back out to CT surgery -- surgery not an option due to extremely high risk of hemorrhagic stroke given brain findings.    9/20: started on NG tube feeds.  Pt pulled tube out next day.  He has since been eating well.   9/21--26: mental status improved, waxes and wanes but overall he's been alert & oriented, with disordered thinking at times.    Patient will need prolonged hospitalization to complete IV antibiotic course (03/15/2022) secondary to his history of IV drug use.  Remains very high risk for deterioration and death.   Assessment & Plan:  * Septic shock (HCC) Patient initially admitted with severe sepsis and septic shock secondary to Serratia bacteremia and aortic valve endocarditis with his history of IV drug abuse. Multiple recent infections including pyelonephritis and osteomyelitis which were not treated adequately as he was keeps leaving AMA. Patient  was intubated and required pressors in the beginning.  Extubated on 01/25/2022 and was weaned off from pressors. --abx per ID  Ischemic cerebrovascular accident (CVA) ([place]) Due to septic emboli. MRI brain on 01/22/22 showed Patchy small  volume areas of diffusion signal abnormality with associated postcontrast enhancement involving the right greater than left parieto-occipital regions. Findings felt to be most consistent with evolving subacute ischemic infarcts. Associated mild petechial hemorrhage without significant regional mass effect. Short interval follow-up MRI suggested to ensure that these changes resolve, and that no other underlying neoplastic or inflammatory process is present. --9/20 - Ongoing encephalopathy out of proportion to sedating meds used for agitation.  Repeat MRI brain ordered -- showed multiple brain abscesses and new septic emboli.   --Repeat MRI 9/26 showed new foci of infarcts  Bacteremia Serratia bacteremia with aortic valve endocarditis ID consulted. 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 Not a suitable candidate for any surgical intervention according to [facility] cardioth[facility]acic surgery. 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  See Dr. Abran Duke note.  Surgery contraindicated due to very high risk of hemorrhagic stroke given MRI findings. --Continue with antibiotics per ID --repeat Echo next week to assess for size of vegetation  Altered mental status Initially was admitted with toxic metabolic encephalopathy secondary to sepsis and illicit drug use.    Now mental status is multifactorial but now likely due to brain abscesses and multiple septic emboli. He had been having intermittent agitation and delirium, requiring Haldol.  Sedating meds have since been held and minimized.  --Continue with as needed haloperidol (avoid if possible0.  --Methadone 20 mg daily --As needed  trazodone at night.    Drug abuse ([place]) --UDS pos for cocaine and opioids  Osteomyelitis ([place]) Right ischial tuberosity osteomyelitis on MRI which is little unusual site for osteo.  No abscess or overlying ulcer.  Positive blood cultures, most likely blood seeding. -On appropriate antibiotics  Essential hypertension Patient was on amlodipine at home.  Admitted with septic shock requiring pressors. Blood pressure still on softer side-weaned off from pressors.  Continue to hold on antihypertensive medications.   Acute HFrEF (heart failure with reduced ejection fraction) (HCC) EF of 45 to 50% with global hypokinesis.  Decreased from his prior echocardiogram done in July 2023.  Most likely secondary to aortic endocarditis. Clinically appears euvolemic.  No clinical signs of exacerbation.   Polysubstance (including opioids) dependence with physiol dependence ([place]) History of IV opioid use for many years. Psych was consulted for concern of opioid withdrawal and he was started on methadone to prevent another AMA.  Patient on methadone 20 mg daily with good toleration.   Type 2 diabetes mellitus (HCC) --no need for BG checks and SSI  Normocytic anemia Most likely secondary to bone marrow suppression with sepsis.  Anemia panel shows anemia of chronic disease with some iron deficiency. --Continue with oral iron supplementation  Transfused 1 unit RBC's on 9/23 for Hgb is 6.7.  No obvious signs of bleeding. Hbg since transfusion: 8.9 >> 8.0 >> 7.5 --Trend Hbg, transfuse if < 7.0  Thrombocytopenia (HCC) Patient developed sudden onset thrombocytopenia to nadir of platelets 43k. HIT panel was negative. DIC labs were negative. Most likely secondary  to bone marrow suppression with sepsis.  No obvious bleeding. Platelets improving 110k. Monitor CBC.  Protein-calorie malnutrition, severe Estimated body mass index is 15.67 kg/m as calculated from the following:   Height as of 01/14/22:  5' 8.11" (1.73 m).   Weight as of this encounter: 46.9 kg.   Continue with nutritional supplements.  Started on tube feeds by NG tube 02/08/22. Patient pulled out NG tube. Eating well, consuming nearly 100% of all meals.   Continue encourage PO intake & hydration. Dietitian following.   Goals of care, counseling/discussion Code status DNR. Palliative care following.   Hypophosphatemia May be having refeeding syndrome. Phos replaced on 9/25 for phos level 2.2, --Monitor phos & replace PRN  Cerebral abscess (embolic) Discussed with Neurosurgery - see their consult note.  No intervention at this time.  Repeat MRI brain on 9/26 showed more abscesses Continue IV antibiotics and monitor closely.     DVT prophylaxis: Lovenox SQ Code Status: DNR  Family Communication:  Level of care: Med-Surg Dispo:   The patient is from: home Anticipated d/c is to: undetermined Anticipated d/c date is: undetermined Patient currently is not medically ready to d/c due to: need IV abx   Subjective and Interval History:  Pt said he was upset with his sister for being upset with him for walking into the hall on his own unassisted.  I advised pt to call for assistance if he wants to get up to go walk.   Objective: Vitals:   02/16/22 1959 02/17/22 0458 02/17/22 0920 02/17/22 1639  BP: 134/82 (!) 99/50 (!) 106/58 (!) 114/59  Pulse: 83 78 83 70  Resp: 18 17 20 18   Temp: 98.6 F (37 C) 98.6 F (37 C) 98.8 F (37.1 C) 98.8 F (37.1 C)  TempSrc:      SpO2: 98% 99% 100% 96%  Weight:        Intake/Output Summary (Last 24 hours) at 02/17/2022 1928 Last data filed at 02/17/2022 1711 Gross per 24 hour  Intake --  Output 2200 ml  Net -2200 ml   Filed Weights   02/12/22 0500 02/14/22 0500 02/15/22 0400  Weight: 51.9 kg 55.3 kg 49.4 kg    Examination:   Constitutional: NAD, AAOx3 HEENT: conjunctivae and lids normal, EOMI CV: No cyanosis.   RESP: normal respiratory effort, on RA Neuro: II  - XII grossly intact.     Data Reviewed: I have personally reviewed labs and imaging studies  Time spent: 25 minutes  02/17/22, MD [facility] If 7PM-7AM, please contact night-coverage 02/17/2022, 7:28 PM

## 2022-02-17 NOTE — Progress Notes (Addendum)
Daily Progress Note   Patient Name: Earl Moreno       Date: 02/17/2022 DOB: 1966/03/20  Age: 56 y.o. MRN#: [id number] Attending Physician: Enzo Bi, MD Primary Care Physician: Pcp, No Admit Date: 01/21/2022  Reason for Consultation/Follow-up: Establishing goals of care  Subjective: Request from palliative team member to meet with family regarding goals of care.   In to see patient.  His Sister Earl Moreno, his aunt, and his cousin Earl Moreno were present at bedside.   Family stepped out for a moment for me to be able to talk to patient.  Patient is able to tell me his name, the year of 2026 which was quickly corrected to 2023, the president before the current president was Trump, and that he is at the hospital, and is not eating well.  Upon mentioning his other diagnoses he says he is aware of these things as well.  Patient has an advanced directive packet laying at bedside.  It is marked for his sister Earl Moreno who is present today to be primary healthcare power of attorney and his girlfriend Earl Moreno was marked to be secondary.  The forms have not been signed or notarized.  Inquired who patient would want to be his surrogate decision maker.  He says that he would want Earl Moreno to be his primary decision maker and his oldest daughter Earl Moreno to be his secondary.  Inquired about Earl Moreno who is listed on the form.  He states that this is his girlfriend and "I plan to marry her if I am able to ever get out of the hospital".  He states Earl Moreno is not here as she is currently hospitalized herself.  Upon discussing having the form notarized, he became very upset stating that nobody knows when he is going to die, only God knows.  Upon trying to explain the purpose of having the form put in place, he continued to state  that we were not the ones that would decide when he is supposed to die, and that only God can determine this.  With trying to further explain he became more confused and agitated.  Patient stated that he had nothing more to say, and I stepped out of room.  Met with male family members in meeting room.  They have been kept very well updated, and are able to articulate  his issues.  We discussed his becoming upset with speaking with me.  They advise that he has an ex-wife with my first name.  During meeting patient was heard walking through the hospital around the area of the meeting room yelling [name]'s name.  With discussion, she thought it best to continue with our meeting, as stepping out to speak with him may make his behavior worse.  I stepped out to speak with unit employee, and she states security had been called to escort him back to his room.    They state that patient has 5 children.  They state that 2 of his children were legally adopted by other families.  They state his oldest daughter Earl Moreno has drug dependence issues, and is not capable of making decisions for her father.  They state patient has 1 son who they do not have contact with.  They state he has 1 son who has estranged himself from the family but who they have contact information for.  Attempted to call Earl Moreno several times during the meeting on [name]'s phone unsuccessfully.  We were able to reach the son who she had contact information for, Earl Moreno.  Earl Moreno discusses his father's addiction.  He discusses his past, and discusses what if's and multiple scenarios.  He states in regards to quality of life and care moving forward he would like to shift to comfort focused care for the time that patient has left.  He states ultimately he would like his aunt Earl Moreno to make the decisions.   We discussed his diagnoses, prognosis, GOC, EOL wishes disposition and options.  Created space and opportunity for patient  to explore thoughts and  feelings regarding current medical information.   A detailed discussion was had today regarding advanced directives.  Concepts specific to code status, artifical feeding and hydration, IV antibiotics and rehospitalization were discussed.  The difference between an aggressive medical intervention path and a comfort care path was discussed.  Values and goals of care important to patient and family were attempted to be elicited.  Discussed limitations of medical interventions to prolong quality of life in some situations and discussed the concept of human mortality.  We discussed various scenarios and "what if's".  The ladies discuss girlfriend Earl Moreno, and tell me that Earl Moreno also has drug issues, and used the same needle that [place] did.  They advise Earl Moreno remains in the hospital and has had a stroke as a result of the same bloodstream infection, and this has resulted in paraplegia.  We discussed acceptable quality of life and Earl Moreno states patient has made statements about feeling like his time is limited.   Earl Moreno discusses comfort care.  We discussed symptom management with transition to comfort care.  Earl Moreno states the family is very confused as they have been advised by primary team that he is not a candidate for hospice or for comfort care.  Long discussion about his status, and to address her concerns.  Discussed that from a PMT standpoint, with proceeding with comfort care, and stopping life-prolonging care, he is a candidate for hospice facility placement.  We reviewed imaging and information on his status in detail.  We discussed declining to work with physical therapy yesterday.  Earl Moreno called their sister on speaker phone to discuss his status and wishes as Earl Moreno states sister is in disbelief of patient's status.  Status discussed and questions answered.  Total visit time was 2 hours.  We discussed that I will follow-up Tuesday on my return.  Length of Stay: 26  Current  Medications: Scheduled Meds:   sodium chloride   Intravenous Once   acetaminophen  975 mg Oral Q6H   Chlorhexidine Gluconate Cloth  6 each Topical Daily   enoxaparin (LOVENOX) injection  40 mg Subcutaneous Q24H   feeding supplement  237 mL Oral TID BM   Ferrous Fumarate  1 tablet Oral BID   folic acid  1 mg Oral Daily   methadone  20 mg Oral Daily   multivitamin with minerals  1 tablet Oral Daily   pantoprazole  40 mg Oral Daily   polyethylene glycol  17 g Per Tube Daily   senna  1 tablet Oral BID    Continuous Infusions:  sodium chloride 10 mL/hr at 02/12/22 0113   meropenem (MERREM) IV 2 g (02/17/22 0513)    PRN Meds: sodium chloride, bisacodyl, docusate, haloperidol lactate, ipratropium-albuterol, nicotine polacrilex, sodium phosphate, traZODone  Physical Exam Pulmonary:     Effort: Pulmonary effort is normal.  Neurological:     Mental Status: He is alert.             Vital Signs: BP (!) 106/58 (BP Location: Right Arm)   Pulse 83   Temp 98.8 F (37.1 C)   Resp 20   Wt 49.4 kg   SpO2 100%   BMI 16.51 kg/m  SpO2: SpO2: 100 % O2 Device: O2 Device: Room Air O2 Flow Rate:    Intake/output summary:  Intake/Output Summary (Last 24 hours) at 02/17/2022 1359 Last data filed at 02/17/2022 1257 Gross per 24 hour  Intake 280 ml  Output 1200 ml  Net -920 ml   LBM: Last BM Date : 02/15/22 Baseline Weight: Weight: 53.2 kg Most recent weight: Weight: 49.4 kg  Flowsheet Rows    Flowsheet Row Most Recent Value  Intake Tab   Referral Department Hospitalist  Unit at Time of Referral Intermediate Care Unit  Palliative Care Primary Diagnosis Sepsis/Infectious Disease  Date Notified 01/25/22  Palliative Care Type New Palliative care  Reason for referral Clarify Goals of Care  Date of Admission 01/21/22  Date first seen by Palliative Care 01/26/22  # of days Palliative referral response time 1 Day(s)  # of days IP prior to Palliative referral 4  Clinical Assessment    Palliative Performance Scale Score 40%  Pain Max last 24 hours Not able to report  Pain Min Last 24 hours Not able to report  Dyspnea Max Last 24 Hours Not able to report  Dyspnea Min Last 24 hours Not able to report  Psychosocial & Spiritual Assessment   Palliative Care Outcomes        Patient Active Problem List   Diagnosis Date Noted   Hypophosphatemia 02/10/2022   Goals of care, counseling/discussion 02/10/2022   Cerebral abscess (embolic)    Ischemic cerebrovascular accident (CVA) ([place]) 02/08/2022   Normocytic anemia 01/27/2022   Thrombocytopenia ([place]) 01/27/2022   Osteomyelitis ([place])    Aortic valve endocarditis 01/24/2022   Acute HFrEF (heart failure with reduced ejection fraction) ([place]) 01/24/2022   Septic shock ([place]) 01/22/2022   Altered mental status    Bacteremia    Hypokalemia 01/15/2022   Pelvic mass 01/15/2022   Behavioral problem 01/15/2022   Abscess of multiple sites    Drug abuse ([place])    Protein-calorie malnutrition, severe 02/11/2020   Type 2 diabetes mellitus ([place]) 02/09/2020   Hyperglycemia due to diabetes mellitus ([place]) 02/09/2020   Abscess 02/09/2020   Polysubstance (including opioids) dependence with physiol  dependence ([place]) 02/09/2020   Hyponatremia 02/09/2020   Essential hypertension 02/09/2020   Cellulitis of left hand 06/02/2018    Palliative Care Assessment & Plan    Recommendations/Plan:  Earl Moreno states the family is very confused as they have been advised by primary team x2 that he is not a candidate for hospice or for comfort care.  Discussed that from a PMT standpoint, with proceeding with comfort care, and stopping life-prolonging care, he is a candidate for hospice facility placement.  Reviewed status and imaging results with them.  Discussed his declining to work with therapy yesterday.  Discussed overall picture.  Family would like to speak further and will let the attending team know what their wishes are when they are ready to  make a decision.  Discussed their requesting to speak to the ID physician if they would like to.   We discussed that I will follow-up Tuesday on my return.    Code Status:    Code Status Orders  (From admission, onward)           Start     Ordered   01/23/22 1236  Do not attempt resuscitation (DNR)  Continuous       Question Answer Comment  In the event of cardiac or respiratory ARREST Do not call a "code blue"   In the event of cardiac or respiratory ARREST Do not perform Intubation, CPR, defibrillation or ACLS   In the event of cardiac or respiratory ARREST Use medication by any route, position, wound care, and other measures to relive pain and suffering. May use oxygen, suction and manual treatment of airway obstruction as needed for comfort.      01/23/22 1235           Code Status History     Date Active Date Inactive Code Status Order ID Comments User Context   01/22/2022 0500 01/23/2022 1235 Full Code [id number]  Rust-Chester, Huel Cote, NP ED   01/15/2022 0323 01/15/2022 2145 DNR [id number]  Sidney Ace, Arvella Merles, MD ED   01/15/2022 0320 01/15/2022 0323 Full Code [id number]  Mansy, Arvella Merles, MD ED   11/29/2021 0951 12/02/2021 1556 Full Code [id number]  Collier Bullock, MD ED   10/19/2020 1226 10/20/2020 2117 Full Code [id number]  Collier Bullock, MD ED   02/09/2020 0823 02/14/2020 1942 Full Code [id number]  Collier Bullock, MD ED   02/09/2020 0559 02/09/2020 0822 Full Code [id number]  Athena Masse, MD ED   06/02/2018 2043 06/07/2018 1646 Full Code [id number]  Dustin Flock, MD Inpatient       Thank you for allowing the Palliative Medicine Team to assist in the care of this patient.  Asencion Gowda, NP  Please contact Palliative Medicine Team phone at [phone] for questions and concerns.

## 2022-02-17 NOTE — Progress Notes (Signed)
Occupational Therapy Treatment Patient Details Name: Earl Moreno MRN: [id number] DOB: 04-02-1966 Today's Date: 02/17/2022   History of present illness Pt is a 56 y.o. male  with past medical history of multiple admissions for sepsis including pyelonephritis, GN bacteremia and suspected osteomyelitis in his right hip as recently as 01/15/2022 with multiple attempts to treat with IV antibiotics but patient continues to leave AMA, history of heroin abuse, DM 2, splenic infarct, admitted on 01/21/2022 with septic shock initially requiring intubation/ventilation.   OT comments  Patient in bed upon arrival and agreeable to OT services. Patient declined all ADL tasks. Patient was able to perform bed mobility with supervision. Patient was able to retrieve items from floor with no LOB. Patient was also able to ambulate ~8 feet to recliner and back with supervision. No physical assistance needed during session. Patient left in bed with bed alarm set, call bed in reach, and all needs met.    Recommendations for follow up therapy are one component of a multi-disciplinary discharge planning process, led by the attending physician.  Recommendations may be updated based on patient status, additional functional criteria and insurance authorization.    Follow Up Recommendations  No OT follow up    Assistance Recommended at Discharge Frequent or constant Supervision/Assistance  Patient can return home with the following  Assistance with cooking/housework;Direct supervision/assist for medications management;Direct supervision/assist for financial management;Assist for transportation;A little help with bathing/dressing/bathroom;A little help with walking and/or transfers   Equipment Recommendations  None recommended by OT    Recommendations for Other Services      Precautions / Restrictions Precautions Precautions: Fall Restrictions Weight Bearing Restrictions: No       Mobility Bed Mobility   Bed  Mobility: Supine to Sit, Sit to Supine     Supine to sit: Supervision Sit to supine: Supervision        Transfers Overall transfer level: Needs assistance   Transfers: Sit to/from Stand Sit to Stand: Supervision                 Balance Overall balance assessment: Needs assistance Sitting-balance support: Feet supported, No upper extremity supported Sitting balance-Leahy Scale: Good     Standing balance support: No upper extremity supported Standing balance-Leahy Scale: Good                             ADL either performed or assessed with clinical judgement   ADL                                         General ADL Comments: Patient declined.    Extremity/Trunk Assessment Upper Extremity Assessment Upper Extremity Assessment: Overall WFL for tasks assessed   Lower Extremity Assessment Lower Extremity Assessment: Generalized weakness        Vision Patient Visual Report: No change from baseline            Cognition Arousal/Alertness: Awake/alert Behavior During Therapy: WFL for tasks assessed/performed Overall Cognitive Status: No family/caregiver present to determine baseline cognitive functioning                                                     Pertinent Vitals/ Pain  Pain Assessment Pain Assessment: No/denies pain   Frequency  Min 2X/week        Progress Toward Goals  OT Goals(current goals can now be found in the care plan section)  Progress towards OT goals: Progressing toward goals  Acute Rehab OT Goals OT Goal Formulation: With patient  Plan Frequency remains appropriate;Discharge plan remains appropriate       AM-PAC OT "6 Clicks" Daily Activity     Outcome Measure   Help from another person eating meals?: None Help from another person taking care of personal grooming?: A Lot Help from another person toileting, which includes using toliet, bedpan, or urinal?: A  Lot Help from another person bathing (including washing, rinsing, drying)?: A Lot Help from another person to put on and taking off regular upper body clothing?: A Little Help from another person to put on and taking off regular lower body clothing?: A Lot 6 Click Score: 15    End of Session Equipment Utilized During Treatment:  (none)  OT Visit Diagnosis: Unsteadiness on feet (R26.81);Muscle weakness (generalized) (M62.81)   Activity Tolerance Patient tolerated treatment well   Patient Left in bed;with bed alarm set;with call bell/phone within reach   Nurse Communication Mobility status;Patient requests pain meds;Other (comment) (nicorette gum)        Time: 1840-3754 OT Time Calculation (min): 13 min  Charges: OT General Charges $OT Visit: 1 Visit OT Treatments $Therapeutic Activity: 8-22 mins     Sarinity Dicicco, OTS 02/17/2022, 1:37 PM

## 2022-02-17 NOTE — TOC Initial Note (Signed)
Transition of Care [facility]) - Initial/Assessment Note    Patient Details  Name: Earl Moreno MRN: [id number] Date of Birth: 1965/08/18  Transition of Care [facility]) CM/SW Contact:    Laurena Slimmer, RN Phone Number: 02/17/2022, 9:01 AM  Clinical Narrative:                 Attempt to reach patient's sister, [name]. No answer. Unable to leave a vm.   Expected Discharge Plan: Skilled Nursing Facility Barriers to Discharge: Continued Medical Work up   Patient Goals and CMS Choice Patient states their goals for this hospitalization and ongoing recovery are:: patient unable to state, intubated      Expected Discharge Plan and Services Expected Discharge Plan: [place]   Discharge Planning Services: CM Consult   Living arrangements for the past 2 months: Apartment, Homeless                 DME Arranged: N/A DME Agency: NA                  Prior Living Arrangements/Services Living arrangements for the past 2 months: Apartment, Homeless Lives with:: Significant Other, Friends Patient language and need for interpreter reviewed:: Yes Do you feel safe going back to the place where you live?: No   patietn is homeless- has been living with a friend for a few days  Need for Family Participation in Patient Care: Yes (Comment) Care giver support system in place?: Yes (comment) (son, sister, friend) Current home services: DME (cane) Criminal Activity/Legal Involvement Pertinent to Current Situation/Hospitalization: No - Comment as needed  Activities of Daily Living Home Assistive Devices/Equipment: None ADL Screening (condition at time of admission) Patient's cognitive ability adequate to safely complete daily activities?: Yes Is the patient deaf or have difficulty hearing?: No Does the patient have difficulty seeing, even when wearing glasses/contacts?: No Does the patient have difficulty concentrating, remembering, or making decisions?: Yes Patient able to express  need for assistance with ADLs?: Yes Does the patient have difficulty dressing or bathing?: No Independently performs ADLs?: Yes (appropriate for developmental age) Communication: Independent Dressing (OT): Independent Is this a change from baseline?: Pre-admission baseline Grooming: Independent Is this a change from baseline?: Pre-admission baseline Feeding: Independent Bathing: Independent Is this a change from baseline?: Pre-admission baseline Toileting: Independent In/Out Bed: Independent Walks in Home: Independent Does the patient have difficulty walking or climbing stairs?: Yes Weakness of Legs: Both Weakness of Arms/Hands: None  Permission Sought/Granted Permission sought to share information with : Case Manager, Family Supports Permission granted to share information with : Yes, Verbal Permission Granted  Share Information with NAME: Kenyun Lumley     Permission granted to share info w Relationship: son  Permission granted to share info w Contact Information: [phone]  Emotional Assessment Appearance:: Appears older than stated age Attitude/Demeanor/Rapport: Intubated (Following Commands or Not Following Commands) Affect (typically observed): Unable to Assess   Alcohol / Substance Use: Illicit Drugs Psych Involvement: No (comment)  Admission diagnosis:  Agitation [R45.1] Severe sepsis (HCC) [A41.9, R65.20] Altered mental status, unspecified altered mental status type [R41.82] Acute sepsis [facility]) [A41.9] Patient Active Problem List   Diagnosis Date Noted   Hypophosphatemia 02/10/2022   Goals of care, counseling/discussion 02/10/2022   Cerebral abscess (embolic)    Ischemic cerebrovascular accident (CVA) ([place]) 02/08/2022   Normocytic anemia 01/27/2022   Thrombocytopenia ([place]) 01/27/2022   Osteomyelitis ([place])    Aortic valve endocarditis 01/24/2022   Acute HFrEF (heart failure with reduced ejection  fraction) ([place]) 01/24/2022   Septic shock ([place]) 01/22/2022    Altered mental status    Bacteremia    Hypokalemia 01/15/2022   Pelvic mass 01/15/2022   Behavioral problem 01/15/2022   Abscess of multiple sites    Drug abuse ([place])    Protein-calorie malnutrition, severe 02/11/2020   Type 2 diabetes mellitus ([place]) 02/09/2020   Hyperglycemia due to diabetes mellitus ([place]) 02/09/2020   Abscess 02/09/2020   Polysubstance (including opioids) dependence with physiol dependence ([place]) 02/09/2020   Hyponatremia 02/09/2020   Essential hypertension 02/09/2020   Cellulitis of left hand 06/02/2018   PCP:  Pcp, No Pharmacy:   [facility] [address]), [place])  [address] Phone: [phone] Fax: [place] [address] Phone: [phone] Fax: [phone]     Social Determinants of Health (SDOH) Interventions    Readmission Risk Interventions    01/23/2022   10:21 AM  Readmission Risk Prevention Plan  Transportation Screening Complete  Medication Review ([place]) Complete  PCP or Specialist appointment within 3-5 days of discharge Complete  HRI or [place] Complete  SW Recovery Care/Counseling Consult Complete  [place] Not Applicable

## 2022-02-17 NOTE — TOC Progression Note (Signed)
Transition of Care [facility]) - Progression Note    Patient Details  Name: Earl Moreno MRN: [id number] Date of Birth: [birth year]  Transition [facility]) CM/SW Contact  Laurena Slimmer, RN Phone Number: 02/17/2022, 12:08 PM  Clinical Narrative:    Attempt to reach patient's sister, Earl Moreno to discuss his discharge plan. No answer. VM is full. Unable to leave  a message.    Expected Discharge Plan: [place] Barriers to Discharge: Continued Medical Work up  Expected Discharge Plan and Services Expected Discharge Plan: [place]   Discharge Planning Services: CM Consult   Living arrangements for the past 2 months: Apartment, Homeless                 DME Arranged: N/A DME Agency: NA                   Social Determinants of Health (SDOH) Interventions    Readmission Risk Interventions    01/23/2022   10:21 AM  Readmission Risk Prevention Plan  Transportation Screening Complete  Medication Review ([place]) Complete  PCP or Specialist appointment within 3-5 days of discharge Complete  HRI or [place] Complete  SW Recovery Care/Counseling Consult Complete  [place] Not Applicable

## 2022-02-17 NOTE — Progress Notes (Signed)
PT Cancellation Note  Patient Details Name: Earl Moreno MRN: [id number] DOB: June 17, 1965   Cancelled Treatment:    Reason Eval/Treat Not Completed: Patient declined, no reason specified.  Chart reviewed.  Pt resting in bed upon PT arrival (pt initially appearing to be sleeping but pt reported he was not sleeping when asked).  Pt refusing any therapy at this time (pt reporting d/t needing to go back to sleep).  Attempted to encourage pt to participate in therapy but pt continued to refuse.  Will re-attempt PT session at a later date/time.  Leitha Bleak, PT 02/17/22, 4:44 PM

## 2022-02-18 NOTE — Plan of Care (Addendum)
Pt alert and oriented x 2-3 from what can be assessed. Pt refuses to answer questions. Pt refused hs meds. Attempted x 4. Pt refused assessment. Dr. Damita Dunnings aware. Pt also refused am antibiotic. Pt finally agreed to take. Pt complaint of shortness of breath. Sats WNL. Neb given and pt reported effective.  Problem: Education: Goal: Knowledge of General Education information will improve Description: Including pain rating scale, medication(s)/side effects and non-pharmacologic comfort measures Outcome: Not Progressing   Problem: Health Behavior/Discharge Planning: Goal: Ability to manage health-related needs will improve Outcome: Not Progressing   Problem: Coping: Goal: Level of anxiety will decrease Outcome: Not Progressing   Problem: Clinical Measurements: Goal: Ability to maintain clinical measurements within normal limits will improve Outcome: Progressing Goal: Will remain free from infection Outcome: Progressing Goal: Diagnostic test results will improve Outcome: Progressing Goal: Respiratory complications will improve Outcome: Progressing Goal: Cardiovascular complication will be avoided Outcome: Progressing   Problem: Activity: Goal: Risk for activity intolerance will decrease Outcome: Progressing   Problem: Nutrition: Goal: Adequate nutrition will be maintained Outcome: Progressing   Problem: Elimination: Goal: Will not experience complications related to bowel motility Outcome: Progressing Goal: Will not experience complications related to urinary retention Outcome: Progressing   Problem: Pain Managment: Goal: General experience of comfort will improve Outcome: Progressing   Problem: Safety: Goal: Ability to remain free from injury will improve Outcome: Progressing   Problem: Skin Integrity: Goal: Risk for impaired skin integrity will decrease Outcome: Progressing   Problem: Coping: Goal: Ability to adjust to condition or change in health will  improve Outcome: Progressing   Problem: Fluid Volume: Goal: Ability to maintain a balanced intake and output will improve Outcome: Progressing   Problem: Health Behavior/Discharge Planning: Goal: Ability to identify and utilize available resources and services will improve Outcome: Progressing Goal: Ability to manage health-related needs will improve Outcome: Progressing   Problem: Metabolic: Goal: Ability to maintain appropriate glucose levels will improve Outcome: Progressing   Problem: Nutritional: Goal: Maintenance of adequate nutrition will improve Outcome: Progressing Goal: Progress toward achieving an optimal weight will improve Outcome: Progressing   Problem: Skin Integrity: Goal: Risk for impaired skin integrity will decrease Outcome: Progressing   Problem: Tissue Perfusion: Goal: Adequacy of tissue perfusion will improve Outcome: Progressing

## 2022-02-18 NOTE — Progress Notes (Signed)
PROGRESS NOTE    Earl Moreno  VOZ:[id number] DOB: 02-12-1966 DOA: 01/21/2022 PCP: Pcp, No  108A/108A-AA  LOS: 27 days   Brief hospital course: ICU transfer Following summary taken from prior notes. 56 yo M with history of IVDA, recurrent hospitalization and leaving AMA, presenting to [facility] ED on 01/21/22 via EMS complaining that he does not feel well.   Per chart review patient with multiple admissions for sepsis including pyelonephritis, GN bacteremia & suspected osteomyelitis in his right hip as recently as 01/15/22. There have been multiple attempts to treat with IV antibiotics & perform a TEE and rule out endocarditis but the patient continues to leave AMA.  On arrival to ED patient was febrile with leukocytosis, tachycardic, tachypneic, mild hyponatremia, lactic acidosis and elevated procalcitonin.  COVID-19 and influenza PCR was negative.  UDS positive for cocaine and opioids. ... Due to pt being very combative and agitated despite giving multiple doses of Ativan and Haldol, he was started on Precedex and maxed out without much change, resulted in intubation and mechanical ventilation.  Extubated on 01/25/2022.  Patient was found to have Serratia bacteremia with aortic valve endocarditis, infectious disease was consulted and patient was placed on dual coverage with cefepime and ciprofloxacin.  Repeat blood cultures on 01/26/2022 negative. Has since been transitioned to meropenem, with course to extend through 03/15/22.  Patient was also evaluated by [facility] cardiothor[facility] surgery and deemed not a candidate for surgical intervention.  Psych was also consulted for opioid withdrawal and he was started on methadone on 01/26/2022 at 40 mg daily-dose will be titrated by psych to keep him comfortable so he will not leave another AMA.  Palliative care was consulted and he was made DNR with current level of care and treat the treatable.  They continue to follow for goal of care discussion given  overall poor prognosis given extent of his infection and complications.  TRH to resume care from 01/27/2022.  9/12 and 9/20: UDS was checked at sister's request, suspecting pt's son giving him some drugs.  UDS only positive for methadone which he is taking it here.  No other concerns.  9/18 patient sustained a mechanical fall while trying to get out of bed. He attempted to go the restroom to urinate. Head CT negative  9/20 - repeated MRI brain given his persistent encephalopathy and lethargy, as initial MRI showed subacute infarcts.  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  Reached back out to CT surgery -- surgery not an option due to extremely high risk of hemorrhagic stroke given brain findings.    9/20: started on NG tube feeds.  Pt pulled tube out next day.  He has since been eating well.   9/21--26: mental status improved, waxes and wanes but overall he's been alert & oriented, with disordered thinking at times.    Patient will need prolonged hospitalization to complete IV antibiotic course (03-15-2022) secondary to his history of IV drug use.  Remains very high risk for deterioration and death.   Assessment & Plan:  * Septic shock (HCC) Patient initially admitted with severe sepsis and septic shock secondary to Serratia bacteremia and aortic valve endocarditis with his history of IV drug abuse. Multiple recent infections including pyelonephritis and osteomyelitis which were not treated adequately as he was keeps leaving AMA. Patient  was intubated and required pressors in the beginning.  Extubated on 01/25/2022 and was weaned off from pressors. --abx per ID  Ischemic cerebrovascular accident (CVA) ([place]) Due to septic emboli. MRI brain on 01/22/22 showed Patchy small  volume areas of diffusion signal abnormality with associated postcontrast enhancement involving the right greater than left parieto-occipital regions. Findings felt to be most consistent with evolving subacute ischemic infarcts. Associated mild petechial hemorrhage without significant regional mass effect. Short interval follow-up MRI suggested to ensure that these changes resolve, and that no other underlying neoplastic or inflammatory process is present. --9/20 - Ongoing encephalopathy out of proportion to sedating meds used for agitation.  Repeat MRI brain ordered -- showed multiple brain abscesses and new septic emboli.   --Repeat MRI 9/26 showed new foci of infarcts  Bacteremia Serratia bacteremia with aortic valve endocarditis ID consulted. 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 Not a suitable candidate for any surgical intervention according to [facility] cardiothoracic surgery. 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  See Dr. Abran Duke note.  Surgery contraindicated due to very high risk of hemorrhagic stroke given MRI findings. --Continue with antibiotics per ID --repeat Echo next week to assess for size of vegetation  Altered mental status Initially was admitted with toxic metabolic encephalopathy secondary to sepsis and illicit drug use.    Now mental status is multifactorial but now likely due to brain abscesses and multiple septic emboli. He had been having intermittent agitation and delirium, requiring Haldol.  Sedating meds have since been held and minimized.  --Continue with as needed haloperidol (avoid if possible0.  --Methadone 20 mg daily --As needed  trazodone at night.    Drug abuse ([place]) --UDS pos for cocaine and opioids  Osteomyelitis ([place]) Right ischial tuberosity osteomyelitis on MRI which is little unusual site for osteo.  No abscess or overlying ulcer.  Positive blood cultures, most likely blood seeding. -On appropriate antibiotics  Essential hypertension Patient was on amlodipine at home.  Admitted with septic shock requiring pressors. Blood pressure still on softer side-weaned off from pressors.  Continue to hold on antihypertensive medications.   Acute HFrEF (heart failure with reduced ejection fraction) (HCC) EF of 45 to 50% with global hypokinesis.  Decreased from his prior echocardiogram done in July 2023.  Most likely secondary to aortic endocarditis. Clinically appears euvolemic.  No clinical signs of exacerbation.   Polysubstance (including opioids) dependence with physiol dependence ([place]) History of IV opioid use for many years. Psych was consulted for concern of opioid withdrawal and he was started on methadone to prevent another AMA.  Patient on methadone 20 mg daily with good toleration.   Type 2 diabetes mellitus (HCC) --no need for BG checks and SSI  Normocytic anemia Most likely secondary to bone marrow suppression with sepsis.  Anemia panel shows anemia of chronic disease with some iron deficiency. --Continue with oral iron supplementation  Transfused 1 unit RBC's on 9/23 for Hgb is 6.7.  No obvious signs of bleeding. Hbg since transfusion: 8.9 >> 8.0 >> 7.5 --Trend Hbg, transfuse if < 7.0  Thrombocytopenia (HCC) Patient developed sudden onset thrombocytopenia to nadir of platelets 43k. HIT panel was negative. DIC labs were negative. Most likely secondary  to bone marrow suppression with sepsis.  No obvious bleeding. Platelets improving 110k. Monitor CBC.  Protein-calorie malnutrition, severe Estimated body mass index is 15.67 kg/m as calculated from the following:   Height as of 01/14/22:  5' 8.11" (1.73 m).   Weight as of this encounter: 46.9 kg.   Continue with nutritional supplements.  Started on tube feeds by NG tube 02/08/22. Patient pulled out NG tube. Eating well, consuming nearly 100% of all meals.   Continue encourage PO intake & hydration. Dietitian following.   Goals of care, counseling/discussion Code status DNR. Palliative care following.   Hypophosphatemia May be having refeeding syndrome. Phos replaced on 9/25 for phos level 2.2, --Monitor phos & replace PRN  Cerebral abscess (embolic) Discussed with Neurosurgery - see their consult note.  No intervention at this time.  Repeat MRI brain on 9/26 showed more abscesses Continue IV antibiotics and monitor closely.     DVT prophylaxis: Lovenox SQ Code Status: DNR  Family Communication:  Level of care: Med-Surg Dispo:   The patient is from: home Anticipated d/c is to: undetermined Anticipated d/c date is: undetermined Patient currently is not medically ready to d/c due to: need IV abx   Subjective and Interval History:  No acute event.  Sister reported pt's feet turn purple when standing, and that his legs and testicles were swollen.   Objective: Vitals:   02/17/22 2015 02/18/22 0434 02/18/22 0747 02/18/22 1628  BP: (!) 105/58 122/64 113/62 128/65  Pulse: 76 89 78 77  Resp: 18 16 17 17   Temp: 99 F (37.2 C) 98.5 F (36.9 C) 97.7 F (36.5 C) 98.4 F (36.9 C)  TempSrc:  Oral    SpO2: 99% 97% 98% 100%  Weight:       No intake or output data in the 24 hours ending 02/18/22 1756  Filed Weights   02/12/22 0500 02/14/22 0500 02/15/22 0400  Weight: 51.9 kg 55.3 kg 49.4 kg    Examination:   Constitutional: NAD CV: No cyanosis.   RESP: normal respiratory effort, on RA   Data Reviewed: I have personally reviewed labs and imaging studies  Time spent: 25 minutes  02/17/22, MD [facility] If 7PM-7AM, please contact night-coverage 02/18/2022, 5:56 PM

## 2022-02-18 NOTE — Progress Notes (Signed)
Pt continues to refuse at this time.   02/17/22 2300  Notify: Provider  Provider Name/Title H. Damita Dunnings  Date Provider Notified 02/17/22  Time Provider Notified 2355  Method of Notification  (amion)  Notification Reason Other (Comment);Red med refusal (Pt refused lovenox, antibiotics)  Provider response Other (Comment) (recommended to try again.)  Date of Provider Response 02/18/22  Time of Provider Response 0001

## 2022-02-18 NOTE — TOC Progression Note (Addendum)
Transition of Care [facility]) - Progression Note    Patient Details  Name: JULEN RUBERT MRN: [id number] Date of Birth: 03/25/1966  Transition of Care [facility] CM/SW [place], LCSW Phone Number: 02/18/2022, 4:16 PM  Clinical Narrative:   Call to sister [name] per her request.  She stated she is still needing to do HCPOA paperwork, requested Chaplain consult.  She stated she is trying to figure out the plan for patient. She stated patient "doesn't have a home to go to", that patient was living with friend and cannot return, and then living in his car prior to admission. She stated she is trying to apply for Medicaid to get him placed in an appropriate nursing or group home. CSW reached out to [employer] and requested they assist her with Medicaid application process.    Expected Discharge Plan: [place] Barriers to Discharge: Continued Medical Work up  Expected Discharge Plan and Services Expected Discharge Plan: [place]   Discharge Planning Services: CM Consult   Living arrangements for the past 2 months: Apartment, Homeless                 DME Arranged: N/A DME Agency: NA                   Social Determinants of Health (SDOH) Interventions    Readmission Risk Interventions    01/23/2022   10:21 AM  Readmission Risk Prevention Plan  Transportation Screening Complete  Medication Review ([place]) Complete  PCP or Specialist appointment within 3-5 days of discharge Complete  HRI or [place] Complete  SW Recovery Care/Counseling Consult Complete  [place] Not Applicable

## 2022-02-19 NOTE — Plan of Care (Signed)
  Problem: Clinical Measurements: Goal: Ability to maintain clinical measurements within normal limits will improve Outcome: Progressing   

## 2022-02-19 NOTE — Plan of Care (Signed)
°  Problem: Coping: °Goal: Level of anxiety will decrease °Outcome: Progressing °  °

## 2022-02-19 NOTE — Progress Notes (Addendum)
PROGRESS NOTE    Earl Moreno  FBP:[id number] DOB: 1965-10-14 DOA: 01/21/2022 PCP: Pcp, No  108A/108A-AA  LOS: 28 days   Brief hospital course: ICU transfer Following summary taken from prior notes. 56 yo M with history of IVDA, recurrent hospitalization and leaving AMA, presenting to [facility] ED on 01/21/22 via EMS complaining that he does not feel well.   Per chart review patient with multiple admissions for sepsis including pyelonephritis, GN bacteremia & suspected osteomyelitis in his right hip as recently as 01/15/22. There have been multiple attempts to treat with IV antibiotics & perform a TEE and rule out endocarditis but the patient continues to leave AMA.  On arrival to ED patient was febrile with leukocytosis, tachycardic, tachypneic, mild hyponatremia, lactic acidosis and elevated procalcitonin.  COVID-19 and influenza PCR was negative.  UDS positive for cocaine and opioids. ... Due to pt being very combative and agitated despite giving multiple doses of Ativan and Haldol, he was started on Precedex and maxed out without much change, resulted in intubation and mechanical ventilation.  Extubated on 01/25/2022.  Patient was found to have Serratia bacteremia with aortic valve endocarditis, infectious disease was consulted and patient was placed on dual coverage with cefepime and ciprofloxacin.  Repeat blood cultures on 01/26/2022 negative. Has since been transitioned to meropenem, with course to extend through 03/24/2022.  Patient was also evaluated by [facility] and deemed not a candidate for surgical intervention.  Psych was also consulted for opioid withdrawal and he was started on methadone on 01/26/2022 at 40 mg daily-dose will be titrated by psych to keep him comfortable so he will not leave another AMA.  Palliative care was consulted and he was made DNR with current level of care and treat the treatable.  They continue to follow for goal of care discussion given  overall poor prognosis given extent of his infection and complications.  TRH to resume care from 01/27/2022.  9/12 and 9/20: UDS was checked at sister's request, suspecting pt's son giving him some drugs.  UDS only positive for methadone which he is taking it here.  No other concerns.  9/18 patient sustained a mechanical fall while trying to get out of bed. He attempted to go the restroom to urinate. Head CT negative  9/20 - repeated MRI brain given his persistent encephalopathy and lethargy, as initial MRI showed subacute infarcts.  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  Reached back out to CT surgery -- surgery not an option due to extremely high risk of hemorrhagic stroke given brain findings.    9/20: started on NG tube feeds.  Pt pulled tube out next day.  He has since been eating well.   9/21--26: mental status improved, waxes and wanes but overall he's been alert & oriented, with disordered thinking at times.    Patient will need prolonged hospitalization to complete IV antibiotic course (03/24/22) secondary to his history of IV drug use.  Remains very high risk for deterioration and death.   Assessment & Plan:  * Septic shock ([place]) Patient initially admitted with severe sepsis and septic shock secondary to Serratia bacteremia and aortic valve endocarditis with his history of IV drug abuse. Multiple recent infections including pyelonephritis and osteomyelitis which were not treated adequately as he was keeps leaving AMA. Patient  was intubated and required pressors in the beginning.  Extubated on 01/25/2022 and was weaned off from pressors. --abx per ID  Cerebral abscess (embolic) Discussed with Neurosurgery - see their consult note.  No intervention at this time.   Repeat MRI brain on 9/26 showed more abscesses Continue IV antibiotics and monitor closely.    Ischemic cerebrovascular accident (CVA) (HCC) Due to septic emboli. MRI brain on 01/22/22 showed Patchy small volume areas of diffusion signal abnormality with associated postcontrast enhancement involving the right greater than left parieto-occipital regions. Findings felt to be most consistent with evolving subacute ischemic infarcts. Associated mild petechial hemorrhage without significant regional mass effect.  --9/20 - Ongoing encephalopathy out of proportion to sedating meds used for agitation.  Repeat MRI brain ordered -- showed multiple brain abscesses and new septic emboli, likely causing infarcts --Repeat MRI 9/26 showed new foci of infarcts --Pt is at high risk for suffering life-threatening and debilitating infarcts if a large piece of septic emboli causes a large area of infarct.  Bacteremia Serratia bacteremia with aortic valve endocarditis ID consulted. 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 Not a suitable candidate for any surgical intervention according to [facility] cardiothoracic surgery. 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  See Dr. Lucilla Lame note.  Surgery contraindicated due to very high risk of hemorrhagic stroke given MRI findings. --Continue with antibiotics per ID --repeat Echo next week to assess for size of vegetation  Altered mental status Initially was admitted with toxic metabolic encephalopathy secondary to sepsis and illicit drug use.    Now mental status is multifactorial but now likely due to brain abscesses and multiple septic emboli. He had  been having intermittent agitation and delirium, requiring Haldol.  Sedating meds have since been held and minimized.  --Continue with as needed haloperidol (avoid if possible0.  --Methadone 20 mg daily --As needed trazodone at night.    Drug abuse (HCC) --UDS pos for cocaine and opioids  Osteomyelitis (HCC) Right ischial tuberosity osteomyelitis on MRI which is little unusual site for osteo.  No abscess or overlying ulcer.  Positive blood cultures, most likely blood seeding. -On appropriate antibiotics  Essential hypertension Patient was on amlodipine at home.  Admitted with septic shock requiring pressors. Blood pressure still on softer side-weaned off from pressors.  Continue to hold on antihypertensive medications.   Acute HFrEF (heart failure with reduced ejection fraction) (HCC) EF of 45 to 50% with global hypokinesis.  Decreased from his prior echocardiogram done in July 2023.  Most likely secondary to aortic endocarditis. Clinically appears euvolemic.  No clinical signs of exacerbation.   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 Psych was consulted for concern of opioid withdrawal and he was started on methadone to prevent another AMA.  Patient on methadone 20 mg daily with good toleration.   Type 2 diabetes mellitus (HCC) --no need for BG checks and SSI  Normocytic anemia Most likely secondary to bone marrow suppression with sepsis.  Anemia panel shows anemia of chronic disease with some iron deficiency. --Continue with oral iron supplementation  Transfused 1 unit RBC's on 9/23 for Hgb is 6.7.  No obvious signs of  bleeding. Hbg since transfusion: 8.9 >> 8.0 >> 7.5 --Trend Hbg, transfuse if < 7.0  Thrombocytopenia (HCC) Patient developed sudden onset thrombocytopenia to nadir of platelets 43k. HIT panel was negative. DIC labs were negative. Most likely secondary to bone marrow  suppression with sepsis.  No obvious bleeding. --plt now back to normal  Protein-calorie malnutrition, severe Estimated body mass index is 15.67 kg/m as calculated from the following:   Height as of 01/14/22: 5' 8.11" (1.73 m).   Weight as of this encounter: 46.9 kg.   Continue with nutritional supplements.  Started on tube feeds by NG tube 02/08/22. Patient pulled out NG tube. Eating well, consuming nearly 100% of all meals.   Continue encourage PO intake & hydration. Dietitian following.   Goals of care, counseling/discussion Code status DNR. Palliative care following.   Hypophosphatemia May be having refeeding syndrome. Phos replaced on 9/25 for phos level 2.2, --Monitor phos & replace PRN   DVT prophylaxis: Lovenox SQ Code Status: DNR  Family Communication: son updated at bedside today Level of care: Med-Surg Dispo:   The patient is from: home Anticipated d/c is to: undetermined Anticipated d/c date is: undetermined Patient currently is not medically ready to d/c due to: on IV abx  Discussed with Dr. Rivka Safer about pt's prognosis on 02/16/22.   Pt has significant bacterial burden due to the large aortic valve vegetation, which is breaking off little pieces and traveling to other areas of the body, in particular, to the brain as septic emboli which is also causing infarcts.  With prolonged abx therapy, there is a chance of clearing infection, however, at any given point, pt could have a large piece of septic embolus breaking off the vegetation and ends up in the brain causing life-threatening or debilitating strokes.  Currently pt is improving, and is not a candidate for hospice facility, however, pt remains a very high risk for deterioration and death due to above reason.  This prognosis is related to pt's sister Earl Moreno who is his designated Management consultant.   Subjective and Interval History:  Pt denied any swelling in his scrota.  Visiting with son  today.   Objective: Vitals:   02/19/22 0445 02/19/22 0500 02/19/22 0909 02/19/22 1636  BP: 127/68  116/71 (!) 110/52  Pulse: 87  86 80  Resp: 19  18 16   Temp: 99.5 F (37.5 C)  99.8 F (37.7 C) 98.9 F (37.2 C)  TempSrc:   Oral   SpO2: 97%  98% 100%  Weight:  45.2 kg      Intake/Output Summary (Last 24 hours) at 02/19/2022 1812 Last data filed at 02/19/2022 1136 Gross per 24 hour  Intake 580 ml  Output 1000 ml  Net -420 ml    Filed Weights   02/14/22 0500 02/15/22 0400 02/19/22 0500  Weight: 55.3 kg 49.4 kg 45.2 kg    Examination:   Constitutional: NAD, AAOx3, eating his meal HEENT: conjunctivae and lids normal, EOMI CV: No cyanosis.   RESP: normal respiratory effort, on RA Extremities: No effusions, edema in BLE SKIN: warm, dry Neuro: II - XII grossly intact.     Data Reviewed: I have personally reviewed labs and imaging studies  Time spent: 25 minutes  04/21/22, MD [facility] If 7PM-7AM, please contact night-coverage 02/19/2022, 6:12 PM

## 2022-02-19 NOTE — Progress Notes (Signed)
PT Cancellation Note  Patient Details Name: Earl Moreno MRN: [id number] DOB: 09-09-1965   Cancelled Treatment:    Reason Eval/Treat Not Completed: Fatigue/lethargy limiting ability to participate  Pt offered and encouraged session.  Pt refused stating "I am very very tired."  Will return at a later date.   Chesley Noon 02/19/2022, 10:43 AM

## 2022-02-20 NOTE — Progress Notes (Signed)
   02/20/22 1000  Clinical Encounter Type  Visited With Patient and family together  Visit Type Follow-up  Referral From Physician  Consult/Referral To [place] responded to page from physician to complete AD paperwork. Chaplain provided compassionate presence and reflective listening as patient spoke about health challenges. Patient communicated he was sad today and was teary. Patient has disordered thinking and does not follow conversations. Chaplain provided support for sister who was bedside as well. Patient is not a candidate for completing AD paperwork. Chaplain services are available for follow up as needed.

## 2022-02-20 NOTE — Progress Notes (Signed)
Date of Admission:  01/21/2022     ID: Earl Moreno is a 56 y.o. male  Principal Problem:   Septic shock ([place]) Active Problems:   Type 2 diabetes mellitus ([place])   Polysubstance (including opioids) dependence with physiol dependence ([place])   Essential hypertension   Protein-calorie malnutrition, severe   Drug abuse ([place])   Altered mental status   Bacteremia   Aortic valve endocarditis   Acute HFrEF (heart failure with reduced ejection fraction) (HCC)   Osteomyelitis (HCC)   Normocytic anemia   Thrombocytopenia (HCC)   Ischemic cerebrovascular accident (CVA) ([place])   Cerebral abscess (embolic)   Hypophosphatemia   Goals of care, counseling/discussion    Subjective: Pt sitting in chair Says he walked with PT Sister and friends at bed side      Medications:   sodium chloride   Intravenous Once   acetaminophen  975 mg Oral Q6H   enoxaparin (LOVENOX) injection  40 mg Subcutaneous Q24H   feeding supplement  237 mL Oral TID BM   Ferrous Fumarate  1 tablet Oral BID   folic acid  1 mg Oral Daily   methadone  20 mg Oral Daily   multivitamin with minerals  1 tablet Oral Daily   pantoprazole  40 mg Oral Daily   polyethylene glycol  17 g Per Tube Daily   senna  1 tablet Oral BID    Objective: Vital signs in last 24 hours:Patient Vitals for the past 24 hrs:  BP Temp Temp src Pulse Resp SpO2 Weight  02/20/22 0500 -- -- -- -- -- -- 45.2 kg  02/20/22 0453 136/65 99.4 F (37.4 C) -- 85 -- 99 % --  02/19/22 2000 (!) 116/59 98.6 F (37 C) Axillary 89 16 98 % --  02/19/22 1636 (!) 110/52 98.9 F (37.2 C) -- 80 16 100 % --      PHYSICAL EXAM:  General:awake and alert, speech slow, emaciated Sitting in chair Lungs: b/l air entry Heart: s1s2 Abdomen: Soft,  CNS non focal     Latest Ref Rng & Units 02/17/2022    5:30 AM 02/14/2022    8:41 AM 02/13/2022    5:00 AM  CBC  WBC 4.0 - 10.5 K/uL 9.3  9.0  9.8   Hemoglobin 13.0 - 17.0 g/dL 7.5  7.5  8.0   Hematocrit 39.0 -  52.0 % 23.9  23.7  24.3   Platelets 150 - 400 K/uL 152  111  104        Latest Ref Rng & Units 02/17/2022    5:30 AM 02/13/2022    5:00 AM 02/11/2022    8:42 AM  CMP  Glucose 70 - 99 mg/dL 161  119  112   BUN 6 - 20 mg/dL 8  10  12    Creatinine 0.61 - 1.24 mg/dL 0.61  0.55  0.65   Sodium 135 - 145 mmol/L 131  134  132   Potassium 3.5 - 5.1 mmol/L 3.8  3.7  3.6   Chloride 98 - 111 mmol/L 96  104  102   CO2 22 - 32 mmol/L 28  25  24    Calcium 8.9 - 10.3 mg/dL 7.9  7.4  7.9        Microbiology: 01/22/22 - serratia 01/26/22 BC neg  MRI- Three ring enhancing lesions with edema around in the parietal lobe Multiple areas of microhemorrhages  Assessment/Plan: Encephalopathy secondary to sepsis and infarct - Due to multiple cerebral abscesses from septic  emboli of aortic endocarditis. Has a vegetation > 1 cm- high risk for further emboli but he is not a candidate for surgery The antibiotics cefepime and cipro were also likely contributing to the encephalopathy- They were discontinued he is now on  meropenem and has been more alert ?  Serratia bacteremia with aortic valve endocarditis wiith the vegetation >of 1.8 cm , causing multiple septic emboli to the brain,which is ongoing leading to small abscesses Repeat blood culture neg from 01/26/22 Day 30/42 antibiotic .On Meropenem currently The echo repeated on 9/20 after 2/1/2 weeks of antibiotic did not show any significant change in size. High risk for ongoing emboli Will repeat 2 d echo todayto look  the size of the vegetation  and then decide on PO Cipro    Osteomyelitis rt pubic ramus  IVDA  heroin user - now on methadone  20mg - as he is sleeping all the time may consider reducing it to 15 mg   -No resp distress   Anemia   Thrombocytopenia resolved    Previous bacteremias including serratia , enterobacter- not treated because of him leaving AMA ?Discussed the management with sister and hospitalist

## 2022-02-20 NOTE — Progress Notes (Signed)
Occupational Therapy Treatment Patient Details Name: Earl Moreno MRN: [id number] DOB: Oct 17, 1965 Today's Date: 02/20/2022   History of present illness Pt is a 56 y.o. male  with past medical history of multiple admissions for sepsis including pyelonephritis, GN bacteremia and suspected osteomyelitis in his right hip as recently as 01/15/2022 with multiple attempts to treat with IV antibiotics but patient continues to leave AMA, history of heroin abuse, DM 2, splenic infarct, admitted on 01/21/2022 with septic shock initially requiring intubation/ventilation.   OT comments  Patient in recliner upon arrival and agreeable to OT treatment. Family present in room. Patient declined ambulation outside of room and showering, but agreed to complete grooming tasks. Patient was able to transfer sit <>stand with supervision; ambulating ~12 to sink to perform grooming tasks with supervision (brushing teeth, washing face and hands) Patient required VC to initiate tasks. No LOB while standing at sink and ambulating. Patient left in recliner with chair alarm set, call bell in reach, family in room, and all needs met.    Recommendations for follow up therapy are one component of a multi-disciplinary discharge planning process, led by the attending physician.  Recommendations may be updated based on patient status, additional functional criteria and insurance authorization.    Follow Up Recommendations  No OT follow up    Assistance Recommended at Discharge Frequent or constant Supervision/Assistance  Patient can return home with the following  Assistance with cooking/housework;Direct supervision/assist for medications management;Direct supervision/assist for financial management;Assist for transportation;A little help with bathing/dressing/bathroom;A little help with walking and/or transfers   Equipment Recommendations  None recommended by OT    Recommendations for Other Services      Precautions /  Restrictions Precautions Precautions: Fall Restrictions Weight Bearing Restrictions: No       Mobility Bed Mobility               General bed mobility comments: Patient in recliner upon arrival.    Transfers                         Balance Overall balance assessment: Needs assistance Sitting-balance support: Feet supported, Bilateral upper extremity supported Sitting balance-Leahy Scale: Good     Standing balance support: No upper extremity supported Standing balance-Leahy Scale: Good                             ADL either performed or assessed with clinical judgement   ADL       Grooming: Wash/dry face;Oral care;Wash/dry hands;Supervision/safety                                      Extremity/Trunk Assessment Upper Extremity Assessment Upper Extremity Assessment: Overall WFL for tasks assessed   Lower Extremity Assessment Lower Extremity Assessment: Overall WFL for tasks assessed        Vision Patient Visual Report: No change from baseline            Cognition Arousal/Alertness: Awake/alert Behavior During Therapy: WFL for tasks assessed/performed, Restless Overall Cognitive Status: History of cognitive impairments - at baseline  Pertinent Vitals/ Pain       Pain Assessment Pain Assessment: No/denies pain   Frequency  Min 2X/week        Progress Toward Goals  OT Goals(current goals can now be found in the care plan section)  Progress towards OT goals: Progressing toward goals  Acute Rehab OT Goals OT Goal Formulation: With patient  Plan Frequency remains appropriate;Discharge plan remains appropriate       AM-PAC OT "6 Clicks" Daily Activity     Outcome Measure   Help from another person eating meals?: None Help from another person taking care of personal grooming?: A Little Help from another person toileting, which  includes using toliet, bedpan, or urinal?: A Lot Help from another person bathing (including washing, rinsing, drying)?: A Lot Help from another person to put on and taking off regular upper body clothing?: A Little Help from another person to put on and taking off regular lower body clothing?: A Lot 6 Click Score: 16    End of Session Equipment Utilized During Treatment: Other (comment) (none)  OT Visit Diagnosis: Unsteadiness on feet (R26.81);Muscle weakness (generalized) (M62.81)   Activity Tolerance Patient tolerated treatment well   Patient Left in chair;with chair alarm set;with family/visitor present;with call bell/phone within reach   Nurse Communication Mobility status        Time: 0802-2336 OT Time Calculation (min): 21 min  Charges: OT General Charges $OT Visit: 1 Visit OT Treatments $Self Care/Home Management : 8-22 mins    Suhayb Anzalone, OTS 02/20/2022, 11:37 AM

## 2022-02-20 NOTE — Progress Notes (Signed)
Physical Therapy Treatment Patient Details Name: Earl Moreno MRN: [id number] DOB: 1965-09-19 Today's Date: 02/20/2022   History of Present Illness Pt is a 56 y.o. male  with past medical history of multiple admissions for sepsis including pyelonephritis, GN bacteremia and suspected osteomyelitis in his right hip as recently as 01/15/2022 with multiple attempts to treat with IV antibiotics but patient continues to leave AMA, history of heroin abuse, DM 2, splenic infarct, admitted on 01/21/2022 with septic shock initially requiring intubation/ventilation.    PT Comments    Pt in bed.  Sister in room.  He reports being not wanting to move but does agrees with min encouragement.  Takes extended time to get to EOB.  Steady in sitting.  Eventually he does initiate standing.  Pulls up on walker despite cues.  Assist to steady walker but no assist to stand.  He is able to complete lap on unit with RW and very slow gait with min a at times to correct walker direction and light assist balance.  Varied gait speed often stopping with cues to continue.  Singing at times.  He does remain up in chair with safety precautions in place and family in room.     Recommendations for follow up therapy are one component of a multi-disciplinary discharge planning process, led by the attending physician.  Recommendations may be updated based on patient status, additional functional criteria and insurance authorization.  Follow Up Recommendations  Skilled nursing-short term rehab (<3 hours/day)     Assistance Recommended at Discharge Frequent or constant Supervision/Assistance  Patient can return home with the following A little help with walking and/or transfers;A little help with bathing/dressing/bathroom;Assistance with cooking/housework;Direct supervision/assist for financial management;Assist for transportation;Direct supervision/assist for medications management   Equipment Recommendations  Rolling walker (2  wheels)    Recommendations for Other Services       Precautions / Restrictions Precautions Precautions: Fall Restrictions Weight Bearing Restrictions: No     Mobility  Bed Mobility Overal bed mobility: Needs Assistance Bed Mobility: Supine to Sit     Supine to sit: Min assist     General bed mobility comments: to get fully to EOB as he was sitting too far back and after time given was not moving forward to EOB    Transfers Overall transfer level: Needs assistance Equipment used: Rolling walker (2 wheels) Transfers: Sit to/from Stand Sit to Stand: Min guard           General transfer comment: pulls up on walker and walker stabalized by Clinical research associate    Ambulation/Gait Ambulation/Gait assistance: Min guard, Min assist Gait Distance (Feet): 175 Feet Assistive device: Rolling walker (2 wheels) Gait Pattern/deviations: Step-through pattern, Narrow base of support Gait velocity: decreased     General Gait Details: occasional cues for appropriate use of rolling walker as well as navigational cues. no loss of balance with ambulation.   Stairs             Wheelchair Mobility    Modified Rankin (Stroke Patients Only)       Balance Overall balance assessment: Needs assistance Sitting-balance support: Feet supported, No upper extremity supported Sitting balance-Leahy Scale: Good     Standing balance support: Bilateral upper extremity supported Standing balance-Leahy Scale: Good Standing balance comment: walker for assist                            Cognition Arousal/Alertness: Awake/alert Behavior During Therapy: WFL for tasks assessed/performed,  Restless Overall Cognitive Status: History of cognitive impairments - at baseline                                 General Comments: patient was able to follow single step commands with increased time. increased time for processing. able to hold a an appropriate conversation throughout the  session        Exercises      General Comments        Pertinent Vitals/Pain Pain Assessment Pain Assessment: No/denies pain    Home Living                          Prior Function            PT Goals (current goals can now be found in the care plan section) Progress towards PT goals: Progressing toward goals    Frequency    Min 2X/week      PT Plan Current plan remains appropriate    Co-evaluation              AM-PAC PT "6 Clicks" Mobility   Outcome Measure  Help needed turning from your back to your side while in a flat bed without using bedrails?: None Help needed moving from lying on your back to sitting on the side of a flat bed without using bedrails?: A Little Help needed moving to and from a bed to a chair (including a wheelchair)?: A Little Help needed standing up from a chair using your arms (e.g., wheelchair or bedside chair)?: A Little Help needed to walk in hospital room?: A Little Help needed climbing 3-5 steps with a railing? : A Lot 6 Click Score: 18    End of Session Equipment Utilized During Treatment: Gait belt Activity Tolerance: Patient tolerated treatment well Patient left: in chair;with call bell/phone within reach;with chair alarm set;with family/visitor present Nurse Communication: Mobility status PT Visit Diagnosis: Muscle weakness (generalized) (M62.81);Difficulty in walking, not elsewhere classified (R26.2);Unsteadiness on feet (R26.81)     Time: 3888-2800 PT Time Calculation (min) (ACUTE ONLY): 27 min  Charges:  $Gait Training: 23-37 mins                   Chesley Noon, PTA 02/20/22, 9:51 AM

## 2022-02-20 NOTE — Progress Notes (Signed)
Pt refused meds and lab draw.

## 2022-02-20 NOTE — Progress Notes (Signed)
[place] [facility] [place] NAME: Earl Moreno    MR#:  [id number]  DATE OF BIRTH:  1966/05/09  SUBJECTIVE:  patient seen earlier. He was with his sister Earl Moreno at bedside. Per RN patient had refused medications last night. Patient was communicating well with me. He had periods of forgetfulness. Eating relatively better. I encouraged him to work with physical therapy.    VITALS:  Blood pressure 136/65, pulse 85, temperature 99.4 F (37.4 C), resp. rate 16, weight 45.2 kg, SpO2 99 %.  PHYSICAL EXAMINATION:   GENERAL:  56 y.o.-year-old patient lying in the bed with no acute distress. Thin LUNGS: Normal breath sounds bilaterally, no wheezing, rales, rhonchi.  CARDIOVASCULAR: S1, S2 normal.  ABDOMEN: Soft, nontender, nondistended. Bowel sounds present.  EXTREMITIES: No  edema b/l. Multiple tattoos over the upper body NEUROLOGIC: nonfocal  patient is alert and awake   LABORATORY PANEL:  CBC Recent Labs  Lab 02/17/22 0530  WBC 9.3  HGB 7.5*  HCT 23.9*  PLT 152    Chemistries  Recent Labs  Lab 02/17/22 0530  NA 131*  K 3.8  CL 96*  CO2 28  GLUCOSE 161*  BUN 8  CREATININE 0.61  CALCIUM 7.9*  MG 2.0    Assessment and Plan  56 yo M with history of IVDA, recurrent hospitalization and leaving AMA, presenting to [facility] ED on 01/21/22 via EMS complaining that he does not feel well.    Per chart review patient with multiple admissions for sepsis including pyelonephritis, GN bacteremia & suspected osteomyelitis in his right hip as recently as 01/15/22. There have been multiple attempts to treat with IV antibiotics & perform a TEE and rule out endocarditis but the patient continues to leave AMA. Patient has been in the hospital for more than four weeks. Please review chart for details.  Septic shock cerebral abscess neutral septic emboli --Patient initially admitted with severe sepsis and septic shock secondary to Serratia bacteremia and  aortic valve endocarditis with his history of IV drug abuse. Multiple recent infections including pyelonephritis and osteomyelitis which were not treated adequately as he was keeps leaving AMA. Patient was intubated and required pressors in the beginning.  Extubated on 01/25/2022 and was weaned off from pressors. --abx per ID  Serratia bacteremia with aortic valve endocarditis ID consulted. Repeat blood culture neg from 01/26/22 --cont meropenem --repeat Echo next week to assess for size of vegetation and then decide on oral Cipro, per ID -- patient to get repeat echo today to assess for size of vegetation -- per cardiothoracic consultation is not a candidate for aortic valve replacement due to his history of stroke   Ischemic cerebrovascular accident (CVA) ([place]) Due to septic emboli. MRI brain on 01/22/22 showed Patchy small volume areas of diffusion signal abnormality with associated postcontrast enhancement involving the right greater than left parieto-occipital regions. Findings felt to be most consistent with evolving subacute ischemic infarcts. Associated mild petechial hemorrhage without significant regional mass effect.  --9/20 -  Repeat MRI brain ordered -- showed multiple brain abscesses and new septic emboli, likely causing infarcts --Repeat MRI 9/26 showed new foci of infarcts --Pt is at high risk for suffering life-threatening and debilitating infarcts if a large piece of septic emboli causes a large area of infarct.  Drug abuse ([place]) --UDS pos for cocaine and opioids   Osteomyelitis ([place]) --Right ischial tuberosity osteomyelitis on MRI which is little unusual site for osteo.  No abscess or overlying ulcer.  Positive blood cultures, most likely blood seeding. -On appropriate antibiotics   Chronic HFrEF (heart failure with reduced ejection fraction) (HCC) --EF of 45 to 50% with global hypokinesis.  Decreased from his prior echocardiogram done in July 2023.  Most likely secondary to  aortic endocarditis. --Clinically appears euvolemic.  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 Psych was consulted for concern of opioid withdrawal and he was started on methadone to prevent another AMA.   Patient on methadone 20 mg daily with good toleration.    Type 2 diabetes mellitus (HCC) --no need for BG checks and SSI   Normocytic anemia Most likely secondary to bone marrow suppression with sepsis.  Anemia panel shows anemia of chronic disease with some iron deficiency. --Continue with oral iron supplementation   Transfused 1 unit RBC's on 9/23 for Hgb is 6.7.  No obvious signs of bleeding. Hbg since transfusion: 8.9 >> 8.0 >> 7.5 --Trend Hbg, transfuse if < 7.0   Thrombocytopenia (HCC) Patient developed sudden onset thrombocytopenia to nadir of platelets 43k. HIT panel was negative. DIC labs were negative. Most likely secondary to bone marrow suppression with sepsis.  No obvious bleeding. --plt now back to normal   Protein-calorie malnutrition, severe Estimated body mass index is 15.67 kg/m as calculated from the following: Continue recommendations by dietitian  palliative care consultation appreciated. Patient is DNR.  Discussed at length with patient's sister Earl Moreno who will be point of contact. Will continue medical management with antibiotic. Earl Moreno understands patient's has multiple medical issues and can deteriorate with his health condition any time. Patient remains a DNR. Will discuss with TOC regarding discharge planning.   Family communication : Earl Moreno sister at bedside Consults : ID CODE STATUS: DNR DVT Prophylaxis : Lovenox Level of care: Med-Surg Status is: Inpatient Remains inpatient appropriate because: continue treatment of sepsis    TOTAL TIME TAKING CARE OF THIS PATIENT: 35 minutes.  >50% time spent on counselling and coordination of care  Note: This dictation was  prepared with Dragon dictation along with smaller phrase technology. Any transcriptional errors that result from this process are unintentional.  Enedina Finner M.D    [facility]   CC: Primary care physician; Pcp, No

## 2022-02-21 ENCOUNTER — Inpatient Hospital Stay (HOSPITAL_COMMUNITY)
Admit: 2022-02-21 | Discharge: 2022-02-21 | Disposition: A | Discharge status: Home / Self Care | Payer: Medicaid Other | Attending: Infectious Diseases | Admitting: Infectious Diseases

## 2022-02-21 DIAGNOSIS — I38 Endocarditis, valve unspecified: Secondary | ICD-10-CM

## 2022-02-21 LAB — ECHOCARDIOGRAM COMPLETE
AR max vel: 2.42 cm2
AV Area VTI: 2.69 cm2
AV Area mean vel: 2.49 cm2
AV Mean grad: 3 mmHg
AV Peak grad: 5.2 mmHg
Ao pk vel: 1.14 m/s
Area-P 1/2: 5.66 cm2
P 1/2 time: 194 msec
S' Lateral: 3.57 cm
Weight: 1559.09 oz

## 2022-02-21 LAB — CBC
HCT: 32.4 % — ABNORMAL LOW (ref 39.0–52.0)
Hemoglobin: 10.2 g/dL — ABNORMAL LOW (ref 13.0–17.0)
MCH: 27.1 pg (ref 26.0–34.0)
MCHC: 31.5 g/dL (ref 30.0–36.0)
MCV: 86.2 fL (ref 80.0–100.0)
Platelets: 213 10*3/uL (ref 150–400)
RBC: 3.76 MIL/uL — ABNORMAL LOW (ref 4.22–5.81)
RDW: 15.6 % — ABNORMAL HIGH (ref 11.5–15.5)
WBC: 11.4 10*3/uL — ABNORMAL HIGH (ref 4.0–10.5)
nRBC: 0 % (ref 0.0–0.2)

## 2022-02-21 LAB — MAGNESIUM: Magnesium: 2.3 mg/dL (ref 1.7–2.4)

## 2022-02-21 LAB — BASIC METABOLIC PANEL
Anion gap: 6 (ref 5–15)
BUN: 13 mg/dL (ref 6–20)
CO2: 24 mmol/L (ref 22–32)
Calcium: 8.1 mg/dL — ABNORMAL LOW (ref 8.9–10.3)
Chloride: 98 mmol/L (ref 98–111)
Creatinine, Ser: 0.73 mg/dL (ref 0.61–1.24)
GFR, Estimated: >60 mL/min (ref >60)
Glucose, Bld: 84 mg/dL (ref 70–99)
Potassium: 4.6 mmol/L (ref 3.5–5.1)
Sodium: 128 mmol/L — ABNORMAL LOW (ref 135–145)

## 2022-02-21 NOTE — Progress Notes (Addendum)
Daily Progress Note   Patient Name: Earl Moreno       Date: 02/21/2022 DOB: Apr 16, 1966  Age: 56 y.o. MRN#: [id number] Attending Physician: Fritzi Mandes, MD Primary Care Physician: Pcp, No Admit Date: 01/21/2022  Reason for Consultation/Follow-up: Establishing goals of care  Subjective: Notes and labs reviewed.  Discussed case with infectious disease doctor. In to see patient, he is sitting on side of bed. He tells me "something doesn't feel right". He is unable to explain further, and is unable  to tell me if this is a physical feeling or emotional/spiritual feeling.  He speaks very little and appears to be deep in thought.  With conversation, he indicates that he has a feeling of something to come, and states he worries all the time.  He states he cannot worry about his mother any longer because she is deceased.  He tells me he first started using drugs around 5 years ago when he cut his toe off with a lawnmower.  He states he was seen at the hospital but continued using beyond that.  He asked that I would pray for him.  He desires to speak to the chaplain.  At this time he would like to continue life-prolonging care.  I alerted the chaplain that patient would like to speak with him.  Reviewed finalized echocardiogram from today.  Spoke with his Bary Richard via phone.  She confirms that patient cut his toe off with a lawn more and was seen in [facility].  We discussed that currently patient would like to continue care to treat the treatable.  We discussed prognostication based on his multiple diagnoses, and wishes to continue life-prolonging care versus not continuing life-prolonging care.  We discussed quality of life and multiple scenarios.  Would recommend chaplain service continue to  follow for support.  Would recommend outpatient palliative to follow.  Length of Stay: 30  Current Medications: Scheduled Meds:   sodium chloride   Intravenous Once   acetaminophen  975 mg Oral Q6H   enoxaparin (LOVENOX) injection  40 mg Subcutaneous Q24H   feeding supplement  237 mL Oral TID BM   Ferrous Fumarate  1 tablet Oral BID   folic acid  1 mg Oral Daily   methadone  20 mg Oral Daily   multivitamin with minerals  1  tablet Oral Daily   pantoprazole  40 mg Oral Daily   polyethylene glycol  17 g Per Tube Daily   senna  1 tablet Oral BID    Continuous Infusions:  sodium chloride 10 mL/hr at 02/19/22 1136   meropenem (MERREM) IV 2 g (02/21/22 0544)    PRN Meds: sodium chloride, bisacodyl, docusate, haloperidol lactate, ipratropium-albuterol, nicotine polacrilex, sodium phosphate, traZODone  Physical Exam Pulmonary:     Effort: Pulmonary effort is normal.  Neurological:     Mental Status: He is alert.             Vital Signs: BP 137/62 (BP Location: Right Arm)   Pulse 92   Temp 98.8 F (37.1 C)   Resp 18   Wt 44.2 kg   SpO2 97%   BMI 14.77 kg/m  SpO2: SpO2: 97 % O2 Device: O2 Device: Room Air O2 Flow Rate:    Intake/output summary:  Intake/Output Summary (Last 24 hours) at 02/21/2022 1119 Last data filed at 02/21/2022 0544 Gross per 24 hour  Intake 280 ml  Output 300 ml  Net -20 ml   LBM: Last BM Date : 02/15/22 Baseline Weight: Weight: 53.2 kg Most recent weight: Weight: 44.2 kg   Flowsheet Rows    Flowsheet Row Most Recent Value  Intake Tab   Referral Department Hospitalist  Unit at Time of Referral Intermediate Care Unit  Palliative Care Primary Diagnosis Sepsis/Infectious Disease  Date Notified 01/25/22  Palliative Care Type New Palliative care  Reason for referral Clarify Goals of Care  Date of Admission 01/21/22  Date first seen by Palliative Care 01/26/22  # of days Palliative referral response time 1 Day(s)  # of days IP prior to  Palliative referral 4  Clinical Assessment   Palliative Performance Scale Score 40%  Pain Max last 24 hours Not able to report  Pain Min Last 24 hours Not able to report  Dyspnea Max Last 24 Hours Not able to report  Dyspnea Min Last 24 hours Not able to report  Psychosocial & Spiritual Assessment   Palliative Care Outcomes        Patient Active Problem List   Diagnosis Date Noted   Hypophosphatemia 02/10/2022   Goals of care, counseling/discussion 02/10/2022   Cerebral abscess (embolic)    Ischemic cerebrovascular accident (CVA) (HCC) 02/08/2022   Normocytic anemia 01/27/2022   Thrombocytopenia (HCC) 01/27/2022   Osteomyelitis (HCC)    Aortic valve endocarditis 01/24/2022   Acute HFrEF (heart failure with reduced ejection fraction) (HCC) 01/24/2022   Septic shock (HCC) 01/22/2022   Altered mental status    Bacteremia    Hypokalemia 01/15/2022   Pelvic mass 01/15/2022   Behavioral problem 01/15/2022   Abscess of multiple sites    Drug abuse (HCC)    Protein-calorie malnutrition, severe 02/11/2020   Type 2 diabetes mellitus (HCC) 02/09/2020   Hyperglycemia due to diabetes mellitus (HCC) 02/09/2020   Abscess 02/09/2020   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   Code Status:    Code Status Orders  (From admission, onward)           Start     Ordered   01/23/22 1236  Do not attempt resuscitation (DNR)  Continuous       Question Answer Comment  In  the event of cardiac or respiratory ARREST Do not call a "code blue"   In the event of cardiac or respiratory ARREST Do not perform Intubation, CPR, defibrillation or ACLS   In the event of cardiac or respiratory ARREST Use medication by any route, position, wound care, and other  measures to relive pain and suffering. May use oxygen, suction and manual treatment of airway obstruction as needed for comfort.      01/23/22 1235           Code Status History     Date Active Date Inactive Code Status Order ID Comments User Context   01/22/2022 0500 01/23/2022 1235 Full Code [id number]  Rust-Chester, Cecelia Byars, NP ED   01/15/2022 0323 01/15/2022 2145 DNR [id number]  Arville Care, Vernetta Honey, MD ED   01/15/2022 0320 01/15/2022 0323 Full Code [id number]  Mansy, Vernetta Honey, MD ED   11/29/2021 0951 12/02/2021 1556 Full Code [id number]  Lucile Shutters, MD ED   10/19/2020 1226 10/20/2020 2117 Full Code [id number]  Lucile Shutters, MD ED   02/09/2020 0823 02/14/2020 1942 Full Code [id number]  Lucile Shutters, MD ED   02/09/2020 0559 02/09/2020 0822 Full Code [id number]  Andris Baumann, MD ED   06/02/2018 2043 06/07/2018 1646 Full Code [id number]  Auburn Bilberry, MD Inpatient       Care plan was discussed with ID doctor  Thank you for allowing the Palliative Medicine Team to assist in the care of this patient.    Morton Stall, NP  Please contact Palliative Medicine Team phone at [phone] for questions and concerns.

## 2022-02-21 NOTE — Progress Notes (Signed)
   02/21/22 1100  Clinical Encounter Type  Visited With Patient  Visit Type Follow-up  Referral From Palliative care team  Consult/Referral To [place] responded to referral from Palliative care to visit with patient. Patient is contemplating end of life, eternity, words that his mother has talked to him about while she was still alive. Chaplain allowed him to express himself and give opportunity to "make amends" with his Creator. Patient asked for Chaplin to pray for him.

## 2022-02-21 NOTE — Plan of Care (Signed)
  Problem: Health Behavior/Discharge Planning: Goal: Ability to manage health-related needs will improve Outcome: Not Progressing   

## 2022-02-21 NOTE — Progress Notes (Signed)
*  PRELIMINARY RESULTS* Echocardiogram 2D Echocardiogram has been performed.  Earl Moreno 02/21/2022, 9:38 AM

## 2022-02-21 NOTE — TOC Progression Note (Signed)
Transition of Care [facility]) - Progression Note    Patient Details  Name: Earl Moreno MRN: [id number] Date of Birth: 16-May-1966  [facility] of [facility]) CM/SW Contact  Laurena Slimmer, RN Phone Number: 02/21/2022, 3:40 PM  Clinical Narrative:    Per email retrieved from financial counselor. Patient information was referred to [facility].    Expected Discharge Plan: [place] Barriers to Discharge: Continued Medical Work up  Expected Discharge Plan and Services Expected Discharge Plan: [place]   Discharge Planning Services: CM Consult   Living arrangements for the past 2 months: Apartment, Homeless                 DME Arranged: N/A DME Agency: NA                   Social Determinants of Health (SDOH) Interventions    Readmission Risk Interventions    01/23/2022   10:21 AM  Readmission Risk Prevention Plan  Transportation Screening Complete  Medication Review ([place]) Complete  PCP or Specialist appointment within 3-5 days of discharge Complete  HRI or [place] Complete  SW Recovery Care/Counseling Consult Complete  [place] Not Applicable

## 2022-02-21 NOTE — Progress Notes (Signed)
Date of Admission:  01/21/2022     ID: Earl Moreno is a 56 y.o. 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)   Essential hypertension   Protein-calorie malnutrition, severe   Drug abuse (HCC)   Altered mental status   Bacteremia   Aortic valve endocarditis   Acute HFrEF (heart failure with reduced ejection fraction) (HCC)   Osteomyelitis (HCC)   Normocytic anemia   Thrombocytopenia (HCC)   Ischemic cerebrovascular accident (CVA) (HCC)   Cerebral abscess (embolic)   Hypophosphatemia   Goals of care, counseling/discussion    Subjective: Pt sitting in chair Says he walked with PT Sister and friends at bed side      Medications:   sodium chloride   Intravenous Once   acetaminophen  975 mg Oral Q6H   enoxaparin (LOVENOX) injection  40 mg Subcutaneous Q24H   feeding supplement  237 mL Oral TID BM   Ferrous Fumarate  1 tablet Oral BID   folic acid  1 mg Oral Daily   methadone  20 mg Oral Daily   multivitamin with minerals  1 tablet Oral Daily   pantoprazole  40 mg Oral Daily   polyethylene glycol  17 g Per Tube Daily   senna  1 tablet Oral BID    Objective: Vital signs in last 24 hours:Patient Vitals for the past 24 hrs:  BP Temp Pulse Resp SpO2 Weight  02/21/22 0841 137/62 98.8 F (37.1 C) 92 18 97 % --  02/21/22 0513 124/68 98.2 F (36.8 C) 89 18 98 % --  02/21/22 0406 -- -- -- -- -- 44.2 kg  02/20/22 2046 (!) 116/53 99.7 F (37.6 C) 90 18 98 % --  02/20/22 1534 (!) 111/53 97.8 F (36.6 C) 70 18 95 % --      PHYSICAL EXAM:  General:awake and alert, speech slow, emaciated Sitting in chair Lungs: b/l air entry Heart: s1s2 Abdomen: Soft,  CNS non focal     Latest Ref Rng & Units 02/21/2022    5:23 AM 02/17/2022    5:30 AM 02/14/2022    8:41 AM  CBC  WBC 4.0 - 10.5 K/uL 11.4  9.3  9.0   Hemoglobin 13.0 - 17.0 g/dL 22.0   7.5  7.5   Hematocrit 39.0 - 52.0 % 32.4  23.9  23.7   Platelets 150 - 400 K/uL 213  152  111        Latest Ref Rng & Units 02/21/2022    5:23 AM 02/17/2022    5:30 AM 02/13/2022    5:00 AM  CMP  Glucose 70 - 99 mg/dL 84  254  270   BUN 6 - 20 mg/dL 13  8  10    Creatinine 0.61 - 1.24 mg/dL  6.23  7.62   Sodium 135 - 145 mmol/L 128  131  134   Potassium 3.5 - 5.1 mmol/L 4.6  3.8  3.7   Chloride 98 - 111 mmol/L 98  96  104   CO2 22 - 32 mmol/L 24  28  25    Calcium 8.9 - 10.3 mg/dL 8.1  7.9  7.4        Microbiology: 01/22/22 - serratia 01/26/22 BC neg  MRI- Three ring enhancing lesions with edema around in the parietal lobe Multiple areas of microhemorrhages  Assessment/Plan: Encephalopathy secondary to sepsis and infarct -  Due to multiple cerebral abscesses from septic emboli of aortic endocarditis. Has a vegetation > 1 cm- high risk for further emboli but he is not a candidate for surgery The antibiotics cefepime and cipro were also likely contributing to the encephalopathy- They were discontinued he is now on  meropenem and has been more alert ?  Serratia bacteremia with aortic valve endocarditis wiith the vegetation >of 1.8 cm , causing multiple septic emboli to the brain,which is ongoing leading to small abscesses Repeat blood culture neg from 01/26/22 Day 30/42 antibiotic .On Meropenem currently The echo repeated on 9/20 after 2/1/2 weeks of antibiotic did not show any significant change in size. Will repeat 2 d echo today to look  the size of the vegetation  and then decide on PO Cipro    Osteomyelitis rt pubic ramus  IVDA  heroin user - now on methadone  20mg - as he is sleeping all the time may consider reducing it to 15 mg   -No resp distress   Anemia   Thrombocytopenia resolved    Previous bacteremias including serratia , enterobacter- not treated because of him leaving AMA ?Discussed the management with sister and hospitalist

## 2022-02-21 NOTE — Progress Notes (Addendum)
Physical Therapy Treatment Patient Details Name: Earl Moreno MRN: [id number] DOB: [birth year] Today's Date: 02/21/2022   History of Present Illness Pt is a 56 y.o. male  with past medical history of multiple admissions for sepsis including pyelonephritis, GN bacteremia and suspected osteomyelitis in his right hip as recently as 01/15/2022 with multiple attempts to treat with IV antibiotics but patient continues to leave AMA, history of heroin abuse, DM 2, splenic infarct, admitted on 01/21/2022 with septic shock initially requiring intubation/ventilation.    PT Comments    OOB and completes x 1 lap on unit with RW and min assist.  Continues to need general cues for safety and light min assist for guiding walker and balance.  Motivated today to get OOB for breakfast.  Remained in chair.  Tech already ordered and awaiting meal.  Safety precautions in place.  Recommendations for follow up therapy are one component of a multi-disciplinary discharge planning process, led by the attending physician.  Recommendations may be updated based on patient status, additional functional criteria and insurance authorization.  Follow Up Recommendations  Skilled nursing-short term rehab (<3 hours/day)     Assistance Recommended at Discharge Frequent or constant Supervision/Assistance  Patient can return home with the following A little help with walking and/or transfers;A little help with bathing/dressing/bathroom;Assistance with cooking/housework;Direct supervision/assist for financial management;Assist for transportation;Direct supervision/assist for medications management   Equipment Recommendations  Rolling walker (2 wheels)    Recommendations for Other Services       Precautions / Restrictions Precautions Precautions: Fall Restrictions Weight Bearing Restrictions: No     Mobility  Bed Mobility Overal bed mobility: Needs Assistance Bed Mobility: Supine to Sit     Supine to sit: Min assist      General bed mobility comments: to get fully to EOB as he was sitting too far back and after time given was not moving forward to EOB    Transfers Overall transfer level: Needs assistance Equipment used: Rolling walker (2 wheels) Transfers: Sit to/from Stand Sit to Stand: Min guard           General transfer comment: pulls up on walker and walker stabalized by Probation officer    Ambulation/Gait Ambulation/Gait assistance: Min guard, Min assist   Assistive device: Rolling walker (2 wheels) Gait Pattern/deviations: Step-through pattern, Narrow base of support Gait velocity: decreased     General Gait Details: occasional cues for appropriate use of rolling walker as well as navigational cues. no loss of balance with ambulation.   Stairs             Wheelchair Mobility    Modified Rankin (Stroke Patients Only)       Balance Overall balance assessment: Needs assistance Sitting-balance support: Feet supported, Bilateral upper extremity supported Sitting balance-Leahy Scale: Good     Standing balance support: No upper extremity supported Standing balance-Leahy Scale: Fair Standing balance comment: walker for assist                            Cognition Arousal/Alertness: Awake/alert Behavior During Therapy: WFL for tasks assessed/performed, Restless Overall Cognitive Status: History of cognitive impairments - at baseline                                          Exercises      General Comments  Pertinent Vitals/Pain Pain Assessment Pain Assessment: No/denies pain    Home Living                          Prior Function            PT Goals (current goals can now be found in the care plan section) Progress towards PT goals: Progressing toward goals    Frequency    Min 2X/week      PT Plan Current plan remains appropriate    Co-evaluation              AM-PAC PT "6 Clicks" Mobility   Outcome  Measure  Help needed turning from your back to your side while in a flat bed without using bedrails?: None Help needed moving from lying on your back to sitting on the side of a flat bed without using bedrails?: A Little Help needed moving to and from a bed to a chair (including a wheelchair)?: A Little Help needed standing up from a chair using your arms (e.g., wheelchair or bedside chair)?: A Little Help needed to walk in hospital room?: A Little Help needed climbing 3-5 steps with a railing? : A Lot 6 Click Score: 18    End of Session Equipment Utilized During Treatment: Gait belt Activity Tolerance: Patient tolerated treatment well Patient left: in chair;with call bell/phone within reach;with chair alarm set;with family/visitor present Nurse Communication: Mobility status PT Visit Diagnosis: Muscle weakness (generalized) (M62.81);Difficulty in walking, not elsewhere classified (R26.2);Unsteadiness on feet (R26.81)     Time: 0981-1914 PT Time Calculation (min) (ACUTE ONLY): 15 min  Charges:  $Gait Training: 8-22 mins                     Danielle Dess, PTA 02/21/22, 8:39 AM

## 2022-02-21 NOTE — Progress Notes (Signed)
[place] at [place] NAME: Earl Moreno    MR#:  [id number]  DATE OF BIRTH:  06-23-1965  SUBJECTIVE:  patient seen earlier. He was with his sister Earl Moreno at bedside. Per RN patient had refused medications last night. Patient was communicating well with me. He had periods of forgetfulness. Eating relatively better. I encouraged him to work with physical therapy.   VITALS:  Blood pressure 137/62, pulse 92, temperature 98.8 F (37.1 C), resp. rate 18, weight 44.2 kg, SpO2 97 %.  PHYSICAL EXAMINATION:   GENERAL:  56 y.o.-year-old patient lying in the bed with no acute distress. Thin LUNGS: Normal breath sounds bilaterally, no wheezing, rales, rhonchi.  CARDIOVASCULAR: S1, S2 normal.  ABDOMEN: Soft, nontender, nondistended. Bowel sounds present.  EXTREMITIES: No  edema b/l. Multiple tattoos over the upper body NEUROLOGIC: nonfocal  patient is alert and awake  LABORATORY PANEL:  CBC Recent Labs  Lab 02/21/22 0523  WBC 11.4*  HGB 10.2*  HCT 32.4*  PLT 213     Chemistries  Recent Labs  Lab 02/21/22 0523  NA 128*  K 4.6  CL 98  CO2 24  GLUCOSE 84  BUN 13  CREATININE 0.73  CALCIUM 8.1*  MG 2.3     Assessment and Plan  56 yo M with history of IVDA, recurrent hospitalization and leaving AMA, presenting to [facility] ED on 01/21/22 via EMS complaining that he does not feel well.    Per chart review patient with multiple admissions for sepsis including pyelonephritis, GN bacteremia & suspected osteomyelitis in his right hip as recently as 01/15/22. There have been multiple attempts to treat with IV antibiotics & perform a TEE and rule out endocarditis but the patient continues to leave AMA. Patient has been in the hospital for more than four weeks. Please review chart for details.  Septic shock cerebral abscess neutral septic emboli --Patient initially admitted with severe sepsis and septic shock secondary to Serratia bacteremia and  aortic valve endocarditis with his history of IV drug abuse. Multiple recent infections including pyelonephritis and osteomyelitis which were not treated adequately as he was keeps leaving AMA. Patient was intubated and required pressors in the beginning.  Extubated on 01/25/2022 and was weaned off from pressors. --abx per ID  Serratia bacteremia with aortic valve endocarditis ID consulted. Repeat blood culture neg from 01/26/22 --cont meropenem --repeat Echo next week to assess for size of vegetation and then decide on oral Cipro, per ID -- patient to get repeat echo today to assess for size of vegetation -- per cardiothoracic consultation is not a candidate for aortic valve replacement due to his history of stroke   Ischemic cerebrovascular accident (CVA) ([place]) Due to septic emboli. MRI brain on 01/22/22 showed Patchy small volume areas of diffusion signal abnormality with associated postcontrast enhancement involving the right greater than left parieto-occipital regions. Findings felt to be most consistent with evolving subacute ischemic infarcts. Associated mild petechial hemorrhage without significant regional mass effect.  --9/20 -  Repeat MRI brain ordered -- showed multiple brain abscesses and new septic emboli, likely causing infarcts --Repeat MRI 9/26 showed new foci of infarcts --Pt is at high risk for suffering life-threatening and debilitating infarcts if a large piece of septic emboli causes a large area of infarct.  Drug abuse ([place]) --UDS positive for cocaine and opioids   Osteomyelitis ([place]) --Right ischial tuberosity osteomyelitis on MRI which is little unusual site for osteo.  No abscess or overlying ulcer.  Positive blood cultures, most likely blood seeding. -On appropriate antibiotics   Chronic HFrEF (heart failure with reduced ejection fraction) (HCC) --EF of 45 to 50% with global hypokinesis.  Decreased from his prior echocardiogram done in July 2023.  Most likely  secondary to aortic endocarditis. --Clinically appears euvolemic.  Polysubstance (including opioids) dependence with physiol dependence ([place]) History of IV opioid use for many years. Psych was consulted for concern of opioid withdrawal and he was started on methadone to prevent another AMA.   Patient on methadone 20 mg daily with good toleration.    Type 2 diabetes mellitus (HCC) --no need for BG checks and SSI   Normocytic anemia Most likely secondary to bone marrow suppression with sepsis.  Anemia panel shows anemia of chronic disease with some iron deficiency. --Continue with oral iron supplementation   Transfused 1 unit RBC's on 9/23 for Hgb is 6.7.  No obvious signs of bleeding. Hbg since transfusion: 8.9 >> 8.0 >> 7.5 --Trend Hbg, transfuse if < 7.0   Thrombocytopenia (HCC) Patient developed sudden onset thrombocytopenia to nadir of platelets 43k. HIT panel was negative. DIC labs were negative. --plt now back to normal   Protein-calorie malnutrition, severe Estimated body mass index is 15.67 kg/m as calculated from the following: Continue recommendations by dietitian  palliative care consultation appreciated. Patient is DNR.  Discussed at length with patient's sister Earl Moreno who will be point of contact. Will continue medical management with antibiotic. Earl Moreno understands patient's has multiple medical issues and can deteriorate with his health condition any time. Patient remains a DNR. Will discuss with TOC regarding discharge planning.   Family communication : [name] sister  Consults : ID CODE STATUS: DNR DVT Prophylaxis : Lovenox Level of care: Med-Surg Status is: Inpatient Remains inpatient appropriate because: continue treatment of sepsis    TOTAL TIME TAKING CARE OF THIS PATIENT: 35 minutes.  >50% time spent on counselling and coordination of care  Note: This dictation was prepared with Dragon dictation along with smaller phrase technology. Any  transcriptional errors that result from this process are unintentional.  Fritzi Mandes M.D    [facility]   CC: Primary care physician; Pcp, No

## 2022-02-22 DIAGNOSIS — D649 Anemia, unspecified: Secondary | ICD-10-CM

## 2022-02-22 LAB — CBC
HCT: 29 % — ABNORMAL LOW (ref 39.0–52.0)
Hemoglobin: 9.1 g/dL — ABNORMAL LOW (ref 13.0–17.0)
MCH: 26.6 pg (ref 26.0–34.0)
MCHC: 31.4 g/dL (ref 30.0–36.0)
MCV: 84.8 fL (ref 80.0–100.0)
Platelets: 237 10*3/uL (ref 150–400)
RBC: 3.42 MIL/uL — ABNORMAL LOW (ref 4.22–5.81)
RDW: 15.5 % (ref 11.5–15.5)
WBC: 10.9 10*3/uL — ABNORMAL HIGH (ref 4.0–10.5)
nRBC: 0 % (ref 0.0–0.2)

## 2022-02-22 LAB — URINALYSIS, ROUTINE W REFLEX MICROSCOPIC
Bacteria, UA: NONE SEEN
Bilirubin Urine: NEGATIVE
Glucose, UA: NEGATIVE mg/dL
Hgb urine dipstick: NEGATIVE
Ketones, ur: NEGATIVE mg/dL
Leukocytes,Ua: NEGATIVE
Nitrite: NEGATIVE
Protein, ur: 100 mg/dL — AB
Specific Gravity, Urine: 1.014 (ref 1.005–1.030)
pH: 8 (ref 5.0–8.0)

## 2022-02-22 LAB — BASIC METABOLIC PANEL
Anion gap: 2 — ABNORMAL LOW (ref 5–15)
BUN: 15 mg/dL (ref 6–20)
CO2: 26 mmol/L (ref 22–32)
Calcium: 8.3 mg/dL — ABNORMAL LOW (ref 8.9–10.3)
Chloride: 102 mmol/L (ref 98–111)
Creatinine, Ser: 0.7 mg/dL (ref 0.61–1.24)
GFR, Estimated: >60 mL/min (ref >60)
Glucose, Bld: 90 mg/dL (ref 70–99)
Potassium: 4.8 mmol/L (ref 3.5–5.1)
Sodium: 130 mmol/L — ABNORMAL LOW (ref 135–145)

## 2022-02-22 LAB — MAGNESIUM: Magnesium: 2.3 mg/dL (ref 1.7–2.4)

## 2022-02-22 MED ORDER — ACETAMINOPHEN 325 MG PO TABS
975.0000 mg | ORAL_TABLET | Freq: Four times a day (QID) | ORAL | Status: DC | PRN
  Administered 2022-02-22 – 2022-02-24 (×3): 975 mg via ORAL
  Filled 2022-02-22 (×4): qty 3

## 2022-02-22 NOTE — Progress Notes (Addendum)
Daily Progress Note   Patient Name: Earl Moreno       Date: 02/22/2022 DOB: 1965-07-19  Age: 56 y.o. MRN#: [id number] Attending Physician: Enedina Finner, MD Primary Care Physician: Pcp, No Admit Date: 01/21/2022  Reason for Consultation/Follow-up: Establishing goals of care  Subjective: In to see patient. Staff is working with patient. No family is at bedside. Spoke with OT. Discussed his status from their standpoint. Per staff, his cognitive status waxes and wanes. Spoke with chaplain to try to determine any assistance options following D/C. Chaplain is looking into this; will wait to hear outcome.  Yesterday patient requested to continue to treat the treatable.    Length of Stay: 31  Current Medications: Scheduled Meds:   sodium chloride   Intravenous Once   enoxaparin (LOVENOX) injection  40 mg Subcutaneous Q24H   feeding supplement  237 mL Oral TID BM   Ferrous Fumarate  1 tablet Oral BID   folic acid  1 mg Oral Daily   methadone  20 mg Oral Daily   multivitamin with minerals  1 tablet Oral Daily   pantoprazole  40 mg Oral Daily   polyethylene glycol  17 g Per Tube Daily   senna  1 tablet Oral BID    Continuous Infusions:  sodium chloride 10 mL/hr at 02/19/22 1136   meropenem (MERREM) IV 2 g (02/22/22 1343)    PRN Meds: sodium chloride, acetaminophen, bisacodyl, docusate, haloperidol lactate, ipratropium-albuterol, nicotine polacrilex, sodium phosphate, traZODone  Physical Exam Pulmonary:     Effort: Pulmonary effort is normal.  Neurological:     Mental Status: He is alert.             Vital Signs: BP 112/62 (BP Location: Right Arm)   Pulse 66   Temp 98.5 F (36.9 C) (Oral)   Resp 17   Wt 42.8 kg   SpO2 99%   BMI 14.30 kg/m  SpO2: SpO2: 99 % O2 Device: O2  Device: Room Air O2 Flow Rate:    Intake/output summary:  Intake/Output Summary (Last 24 hours) at 02/22/2022 1419 Last data filed at 02/22/2022 1300 Gross per 24 hour  Intake 720 ml  Output 1050 ml  Net -330 ml   LBM: Last BM Date : 02/20/22 Baseline Weight: Weight: 53.2 kg Most recent weight: Weight: 42.8 kg  Flowsheet Rows    Flowsheet Row Most Recent Value  Intake Tab   Referral Department Hospitalist  Unit at Time of Referral Intermediate Care Unit  Palliative Care Primary Diagnosis Sepsis/Infectious Disease  Date Notified 01/25/22  Palliative Care Type New Palliative care  Reason for referral Clarify Goals of Care  Date of Admission 01/21/22  Date first seen by Palliative Care 01/26/22  # of days Palliative referral response time 1 Day(s)  # of days IP prior to Palliative referral 4  Clinical Assessment   Palliative Performance Scale Score 40%  Pain Max last 24 hours Not able to report  Pain Min Last 24 hours Not able to report  Dyspnea Max Last 24 Hours Not able to report  Dyspnea Min Last 24 hours Not able to report  Psychosocial & Spiritual Assessment   Palliative Care Outcomes        Patient Active Problem List   Diagnosis Date Noted   Hypophosphatemia 02/10/2022   Goals of care, counseling/discussion 02/10/2022   Cerebral abscess (embolic)    Ischemic cerebrovascular accident (CVA) (HCC) 02/08/2022   Normocytic anemia 01/27/2022   Thrombocytopenia (HCC) 01/27/2022   Osteomyelitis (HCC)    Aortic valve endocarditis 01/24/2022   Acute HFrEF (heart failure with reduced ejection fraction) (HCC) 01/24/2022   Septic shock (HCC) 01/22/2022   Altered mental status    Bacteremia    Hypokalemia 01/15/2022   Pelvic mass 01/15/2022   Behavioral problem 01/15/2022   Abscess of multiple sites    Drug abuse (HCC)    Protein-calorie malnutrition, severe 02/11/2020   Type 2 diabetes mellitus (HCC) 02/09/2020   Hyperglycemia due to diabetes mellitus (HCC)  02/09/2020   Abscess 02/09/2020   Polysubstance (including opioids) dependence with physiol dependence (HCC) 02/09/2020   Hyponatremia 02/09/2020   Essential hypertension 02/09/2020   Cellulitis of left hand 06/02/2018    Palliative Care Assessment & Plan    Recommendations/Plan: Continue to treat the treatable.     Code Status:    Code Status Orders  (From admission, onward)           Start     Ordered   01/23/22 1236  Do not attempt resuscitation (DNR)  Continuous       Question Answer Comment  In the event of cardiac or respiratory ARREST Do not call a "code blue"   In the event of cardiac or respiratory ARREST Do not perform Intubation, CPR, defibrillation or ACLS   In the event of cardiac or respiratory ARREST Use medication by any route, position, wound care, and other measures to relive pain and suffering. May use oxygen, suction and manual treatment of airway obstruction as needed for comfort.      01/23/22 1235           Code Status History     Date Active Date Inactive Code Status Order ID Comments User Context   01/22/2022 0500 01/23/2022 1235 Full Code [id number]  Rust-Chester, Cecelia Byars, NP ED   01/15/2022 0323 01/15/2022 2145 DNR [id number]  Arville Care, Vernetta Honey, MD ED   01/15/2022 0320 01/15/2022 0323 Full Code [id number]  Mansy, Vernetta Honey, MD ED   11/29/2021 0951 12/02/2021 1556 Full Code [id number]  Lucile Shutters, MD ED   10/19/2020 1226 10/20/2020 2117 Full Code [id number]  Lucile Shutters, MD ED   02/09/2020 0823 02/14/2020 1942 Full Code [id number]  Lucile Shutters, MD ED   02/09/2020 0559 02/09/2020 0822 Full Code [id number]  Andris Baumann, MD ED   06/02/2018  2043 06/07/2018 1646 Full Code [id number]  Dustin Flock, MD Inpatient      Thank you for allowing the Palliative Medicine Team to assist in the care of this patient.    Asencion Gowda, NP  Please contact Palliative Medicine Team phone at [phone] for questions and concerns.

## 2022-02-22 NOTE — Progress Notes (Signed)
[place] at [place] NAME: Earl Moreno    MR#:  [id number]  DATE OF BIRTH:  June 11, 1965  SUBJECTIVE:  patient seen earlier.continues with creatinine of intermittent education, refusal to work with PT, refusal to take meds. No family at bedside   VITALS:  Blood pressure 112/62, pulse 66, temperature 98.5 F (36.9 C), temperature source Oral, resp. rate 17, weight 42.8 kg, SpO2 99 %.  PHYSICAL EXAMINATION:   GENERAL:  56 y.o.-year-old patient lying in the bed with no acute distress. Thin LUNGS: Normal breath sounds bilaterally, no wheezing, rales, rhonchi.  CARDIOVASCULAR: S1, S2 normal.  ABDOMEN: Soft, nontender, nondistended. Bowel sounds present.  EXTREMITIES: No  edema b/l. Multiple tattoos over the upper body NEUROLOGIC: nonfocal  patient is alert and awake  LABORATORY PANEL:  CBC Recent Labs  Lab 02/22/22 0509  WBC 10.9*  HGB 9.1*  HCT 29.0*  PLT 237     Chemistries  Recent Labs  Lab 02/22/22 0509  NA 130*  K 4.8  CL 102  CO2 26  GLUCOSE 90  BUN 15  CREATININE 0.70  CALCIUM 8.3*  MG 2.3     Assessment and Plan  56 yo M with history of IVDA, recurrent hospitalization and leaving AMA, presenting to [facility] ED on 01/21/22 via EMS complaining that he does not feel well.    Per chart review patient with multiple admissions for sepsis including pyelonephritis, GN bacteremia & suspected osteomyelitis in his right hip as recently as 01/15/22. There have been multiple attempts to treat with IV antibiotics & perform a TEE and rule out endocarditis but the patient continues to leave AMA. Patient has been in the hospital for more than four weeks. Please review chart for details.  Septic shock cerebral abscess neutral septic emboli --Patient initially admitted with severe sepsis and septic shock secondary to Serratia bacteremia and aortic valve endocarditis with his history of IV drug abuse. Multiple recent infections  including pyelonephritis and osteomyelitis which were not treated adequately as he was keeps leaving AMA. Patient was intubated and required pressors in the beginning.  Extubated on 01/25/2022 and was weaned off from pressors. --abx per ID  Serratia bacteremia with aortic valve endocarditis ID consulted. Repeat blood culture neg from 01/26/22 --cont meropenem --repeat Echo next week to assess for size of vegetation and then decide on oral Cipro, per ID -- patient to get repeat echo today to assess for size of vegetation -- per cardiothoracic consultation is not a candidate for aortic valve replacement due to his history of stroke   Ischemic cerebrovascular accident (CVA) ([place]) Due to septic emboli. MRI brain on 01/22/22 showed Patchy small volume areas of diffusion signal abnormality with associated postcontrast enhancement involving the right greater than left parieto-occipital regions. Findings felt to be most consistent with evolving subacute ischemic infarcts. Associated mild petechial hemorrhage without significant regional mass effect.  --9/20 -  Repeat MRI brain ordered -- showed multiple brain abscesses and new septic emboli, likely causing infarcts --Repeat MRI 9/26 showed new foci of infarcts --Pt is at high risk for suffering life-threatening and debilitating infarcts if a large piece of septic emboli causes a large area of infarct.  Drug abuse ([place]) --UDS positive for cocaine and opioids   Osteomyelitis ([place]) --Right ischial tuberosity osteomyelitis on MRI which is little unusual site for osteo.  No abscess or overlying ulcer.  Positive blood cultures, most likely blood seeding. -On appropriate antibiotics   Chronic HFrEF (heart failure  with reduced ejection fraction) (HCC) --EF of 45 to 50% with global hypokinesis.  Decreased from his prior echocardiogram done in July 2023.  Most likely secondary to aortic endocarditis. --Clinically appears euvolemic.  Polysubstance (including  opioids) dependence with physiol dependence (HCC) History of IV opioid use for many years. Psych was consulted for concern of opioid withdrawal and he was started on methadone to prevent another AMA.   Patient on methadone 20 mg daily with good toleration.    Type 2 diabetes mellitus (HCC) --no need for BG checks and SSI   Normocytic anemia Most likely secondary to bone marrow suppression with sepsis.  Anemia panel shows anemia of chronic disease with some iron deficiency. --Continue with oral iron supplementation   Transfused 1 unit RBC's on 9/23 for Hgb is 6.7.  No obvious signs of bleeding. Hbg since transfusion: 8.9 >> 8.0 >> 7.5 --Trend Hbg, transfuse if < 7.0   Thrombocytopenia (HCC) Patient developed sudden onset thrombocytopenia to nadir of platelets 43k. HIT panel was negative. DIC labs were negative. --plt now back to normal   Protein-calorie malnutrition, severe Estimated body mass index is 15.67 kg/m as calculated from the following: Continue recommendations by dietitian  palliative care consultation appreciated. Patient is DNR.  Discussed at length with patient's sister Bjorn Loser who will be point of contact. Will continue medical management with antibiotic. Bjorn Loser understands patient's has multiple medical issues and can deteriorate with his health condition any time. Patient remains a DNR. Will discuss with TOC regarding discharge planning.   Family communication : [name] sister  Consults : ID CODE STATUS: DNR DVT Prophylaxis : Lovenox Level of care: Med-Surg Status is: Inpatient Remains inpatient appropriate because: continue treatment of sepsis and no place to go!  EDD--unknown    TOTAL TIME TAKING CARE OF THIS PATIENT: 25 minutes.  >50% time spent on counselling and coordination of care  Note: This dictation was prepared with Dragon dictation along with smaller phrase technology. Any transcriptional errors that result from this process are  unintentional.  Enedina Finner M.D    [facility]   CC: Primary care physician; Pcp, No

## 2022-02-22 NOTE — Progress Notes (Signed)
Occupational Therapy Treatment Patient Details Name: Earl Moreno MRN: [id number] DOB: 10/31/1965 Today's Date: 02/22/2022   History of present illness Pt is a 56 y.o. male  with past medical history of multiple admissions for sepsis including pyelonephritis, GN bacteremia and suspected osteomyelitis in his right hip as recently as 01/15/2022 with multiple attempts to treat with IV antibiotics but patient continues to leave AMA, history of heroin abuse, DM 2, splenic infarct, admitted on 01/21/2022 with septic shock initially requiring intubation/ventilation.   OT comments  Patient in bed upon arrival and agreeable to OT services. Declined ADL tasks. Patient was able to perform bed mobility and transfers with supervision. Ambulated ~200 ft around nursing station with supervision, holding on to IV pole for safety. Patient with c/o left shoulder pain, RN notified. Patient left in bed with bed alarm set, call bell in reach and all needs met. Patient will need supervision for safety, medication management, and IADLs. No OT follow up recommended. OT to complete order.    Recommendations for follow up therapy are one component of a multi-disciplinary discharge planning process, led by the attending physician.  Recommendations may be updated based on patient status, additional functional criteria and insurance authorization.    Follow Up Recommendations  No OT follow up    Assistance Recommended at Discharge Frequent or constant Supervision/Assistance  Patient can return home with the following  Assistance with cooking/housework;Direct supervision/assist for medications management;Direct supervision/assist for financial management;Assist for transportation;A little help with bathing/dressing/bathroom;A little help with walking and/or transfers   Equipment Recommendations  None recommended by OT    Recommendations for Other Services      Precautions / Restrictions Precautions Precautions:  Fall Restrictions Weight Bearing Restrictions: No       Mobility Bed Mobility Overal bed mobility: Needs Assistance Bed Mobility: Supine to Sit, Sit to Supine     Supine to sit: Supervision Sit to supine: Supervision        Transfers   Equipment used: None (IV pole) Transfers: Sit to/from Stand Sit to Stand: Supervision                 Balance Overall balance assessment: Needs assistance Sitting-balance support: Feet supported, Bilateral upper extremity supported Sitting balance-Leahy Scale: Good     Standing balance support: No upper extremity supported Standing balance-Leahy Scale: Good                             ADL either performed or assessed with clinical judgement   ADL                                         General ADL Comments: Patient declined.    Extremity/Trunk Assessment Upper Extremity Assessment Upper Extremity Assessment: Overall WFL for tasks assessed   Lower Extremity Assessment Lower Extremity Assessment: Overall WFL for tasks assessed        Vision Patient Visual Report: No change from baseline            Cognition Arousal/Alertness: Awake/alert Behavior During Therapy: WFL for tasks assessed/performed, Restless Overall Cognitive Status: History of cognitive impairments - at baseline  Pertinent Vitals/ Pain       Pain Assessment Pain Score: 7  Pain Location: Left shoulder Pain Descriptors / Indicators: Aching, Grimacing, Discomfort Pain Intervention(s): Monitored during session, Patient requesting pain meds-RN notified, Limited activity within patient's tolerance, Repositioned   Progress Toward Goals  OT Goals(current goals can now be found in the care plan section)  Progress towards OT goals: Goals met/education completed, patient discharged from OT  Acute Rehab OT Goals OT Goal Formulation: With patient  Plan  All goals met and education completed, patient discharged from OT services       AM-PAC OT "6 Clicks" Daily Activity     Outcome Measure   Help from another person eating meals?: None Help from another person taking care of personal grooming?: A Little Help from another person toileting, which includes using toliet, bedpan, or urinal?: A Little Help from another person bathing (including washing, rinsing, drying)?: A Little Help from another person to put on and taking off regular upper body clothing?: None Help from another person to put on and taking off regular lower body clothing?: A Little 6 Click Score: 20    End of Session Equipment Utilized During Treatment: Other (comment) (IV Pole)  OT Visit Diagnosis: Muscle weakness (generalized) (M62.81);Repeated falls (R29.6)   Activity Tolerance Patient tolerated treatment well;Patient limited by pain   Patient Left in bed;with call bell/phone within reach;with bed alarm set   Nurse Communication Mobility status;Patient requests pain meds        Time: [id number] OT Time Calculation (min): 25 min  Charges: OT General Charges $OT Visit: 1 Visit OT Treatments $Therapeutic Activity: 8-22 mins     Infiniti Hoefling, OTS 02/22/2022, 1:31 PM

## 2022-02-22 NOTE — Progress Notes (Signed)
Nutrition Follow-up  DOCUMENTATION CODES:   Severe malnutrition in context of social or environmental circumstances  INTERVENTION:   -Continue Ensure Enlive po TID, each supplement provides 350 kcal and 20 grams of protein -Magic cup TID with meals, each supplement provides 290 kcal and 9 grams of protein  -Continue MVI with minerals daily  NUTRITION DIAGNOSIS:   Severe Malnutrition related to social / environmental circumstances as evidenced by severe muscle depletion, severe fat depletion.  Ongoing  GOAL:   Patient will meet greater than or equal to 90% of their needs  Progressing   MONITOR:   PO intake, Supplement acceptance, Labs, Weight trends, Skin, I & O's  REASON FOR ASSESSMENT:   Consult Enteral/tube feeding initiation and management  ASSESSMENT:   56 y/o male with h/o DM, substance abuse and HTN who is admitted with severe sepsis with septic shock secondary to osteomyelitis in the R hip and aortic valve endocarditis. MRI brain consistent with evolving subacute ischemic infarcts.  9/7- methadone ordered by psych 9/10- safety sitter initiated 9/16- DNR 9/18- s/p fall when trying to get out of bed 9/20- NGT placed (placement in distal antrum of stomach), TF initiated 9/22- NGT removed  Reviewed I/O's: -810 ml x 24 hours and -633 ml since 02/08/22  UOP: 1.1 L x 24 hours   Pt sleeping soddenly at time of visit and did not arouse to voice.   Pt remains on a regular diet with improved oral intake. Noted meal completions 35-100%. Pt also consuming Ensure supplements- noted two opened on tray table.   Per palliative care notes, pt desires full scope care at this time. Pt to remain in the hospital until 03/14/22 for completion of IV antibiotics.   Medications reviewed and include folic acid, miralax, and senokot.   Labs reviewed: Na: 130, CBGS: 135 (inpatient orders for glycemic control are none).    Diet Order:   Diet Order             Diet regular Room  service appropriate? Yes; Fluid consistency: Thin  Diet effective now                   EDUCATION NEEDS:   No education needs have been identified at this time  Skin:  Skin Assessment: Skin Integrity Issues: Skin Integrity Issues:: DTI DTI: lt buttocks  Last BM:  02/14/22 (type 6)  Height:   Ht Readings from Last 1 Encounters:  01/14/22 5' 8.11" (1.73 m)    Weight:   Wt Readings from Last 1 Encounters:  02/22/22 42.8 kg    Ideal Body Weight:  72.7 kg  BMI:  Body mass index is 14.3 kg/m.  Estimated Nutritional Needs:   Kcal:  5366-4403  Protein:  100-115 grams  Fluid:  > 1.7 L    Loistine Chance, RD, LDN, [place] Registered Dietitian II Certified Diabetes Care and Education Specialist Please refer to [facility] for RD and/or RD on-call/weekend/after hours pager

## 2022-02-22 NOTE — Progress Notes (Signed)
       CROSS COVER NOTE  NAME: Earl Moreno MRN: [id number] DOB : Mar 17, 1966    Date of Service   02/22/2022   HPI/Events of Note   Message received from RN communicating patient report of burning with urination and difficulty starting flow. No reports of urinary retention.  Interventions   Assessment/Plan:  Dysuria PMH Pyelonephritis. No documented history of BPH in chart. UA--> negative      This document was prepared using Dragon voice recognition software and may include unintentional dictation errors.  Neomia Glass DNP, MHA, FNP-BC Nurse Practitioner [facility] Pager [phone]

## 2022-02-22 NOTE — Progress Notes (Signed)
Id Sitting in bed C/o pain all over Wants pain meds Did not work with PT today Sleeping most of the time  O/e awake and alert now Patient Vitals for the past 24 hrs:  BP Temp Temp src Pulse Resp SpO2 Weight  02/22/22 1619 (!) 81/53 97.9 F (36.6 C) -- 65 18 98 % --  02/22/22 0500 112/62 98.5 F (36.9 C) Oral 66 17 99 % --  02/22/22 0310 -- -- -- -- -- -- 42.8 kg  02/21/22 2100 (!) 118/59 98.1 F (36.7 C) Oral 65 17 99 % --    No  distress Chest b/l air entry EGB1D1 - systolic murmur Abd soft Cns non focal Skin - tattoos  Labs    Latest Ref Rng & Units 02/22/2022    5:09 AM 02/21/2022    5:23 AM 02/17/2022    5:30 AM  CBC  WBC 4.0 - 10.5 K/uL 10.9  11.4  9.3   Hemoglobin 13.0 - 17.0 g/dL 9.1  10.2  7.5   Hematocrit 39.0 - 52.0 % 29.0  32.4  23.9   Platelets 150 - 400 K/uL 237  213  152        Latest Ref Rng & Units 02/22/2022    5:09 AM 02/21/2022    5:23 AM 02/17/2022    5:30 AM  CMP  Glucose 70 - 99 mg/dL 90  84  161   BUN 6 - 20 mg/dL 15  13  8    Creatinine 0.61 - 1.24 mg/dL 0.70  0.73  0.61   Sodium 135 - 145 mmol/L 130  128  131   Potassium 3.5 - 5.1 mmol/L 4.8  4.6  3.8   Chloride 98 - 111 mmol/L 102  98  96   CO2 22 - 32 mmol/L 26  24  28    Calcium 8.9 - 10.3 mg/dL 8.3  8.1  7.9      Micro 01/26/22 BC neg   MRI- Three ring enhancing lesions with edema around in the parietal lobe Multiple areas of microhemorrhages  Impression/recommendation Encephalopathy secondary to sepsis, infarcts, meds- resolved  Serratia bacteremia with aortic valve endocarditis which is 1.8cm Pt on meropenem There eis no reduction in the size of the bvegetaiton in the past month inspite of it causing septic emboli to the brain And also been on IV antibiotics for 4 weeks. On 03/02/22 will switch to PO cipro for another month or so   Osteomyelitis rt pubic ramus  Polysubstance use/IVDA poorly compliant with health care in the past In the hospital he has been on methadone  20mg   Anemia  H/o recurrent bacteremia ( untreated) due to IVDA and leaving AMA multiple times  ID will follow him peripherally

## 2022-02-22 NOTE — Progress Notes (Signed)
PT Cancellation Note  Patient Details Name: Earl Moreno MRN: [id number] DOB: 14-Mar-1966   Cancelled Treatment:    Reason Eval/Treat Not Completed: Patient declined, no reason specified;Fatigue/lethargy limiting ability to participate. Patient was sleeping on arrival, declines PT at this time. Will re-attempt tomorrow morning.    Gamal Todisco 02/22/2022, 3:18 PM

## 2022-02-23 ENCOUNTER — Inpatient Hospital Stay: Payer: Medicaid Other

## 2022-02-23 ENCOUNTER — Encounter: Payer: Self-pay | Admitting: Internal Medicine

## 2022-02-23 LAB — HCV RNA QUANT
HCV Quantitative Log: 3.76 log10 IU/mL (ref >1.70)
HCV Quantitative: 5750 IU/mL (ref >50)

## 2022-02-23 MED ORDER — FERROUS SULFATE 325 (65 FE) MG PO TABS
325.0000 mg | ORAL_TABLET | Freq: Every day | ORAL | Status: DC
  Administered 2022-02-23 – 2022-02-24 (×2): 325 mg via ORAL
  Filled 2022-02-23 (×2): qty 1

## 2022-02-23 MED ORDER — IOHEXOL 300 MG/ML  SOLN
80.0000 mL | Freq: Once | INTRAMUSCULAR | Status: AC | PRN
  Administered 2022-02-23: 80 mL via INTRAVENOUS

## 2022-02-23 NOTE — Progress Notes (Signed)
PT Cancellation Note  Patient Details Name: Earl Moreno MRN: [id number] DOB: 17-Aug-1965   Cancelled Treatment:    Reason Eval/Treat Not Completed: Patient declined, no reason specified Patient asks that I come back after lunch. Will re-attempt later.  Malachy Coleman 02/23/2022, 10:37 AM

## 2022-02-23 NOTE — Progress Notes (Signed)
Daily Progress Note   Patient Name: Earl Moreno       Date: 02/23/2022 DOB: 04-Jun-1965  Age: 56 y.o. MRN#: [id number] Attending Physician: Fritzi Mandes, MD Primary Care Physician: Pcp, No Admit Date: 01/21/2022  Reason for Consultation/Follow-up: Establishing goals of care  Subjective: Notes and labs reviewed. In to see patient. Breakfast tray is at bedside with a few bites missing. He states he is not hungry. He states he just wants to sleep. Patient taken to CT.   Per nursing this morning, patient's oral intake is better if someone stands by him to encourage him to eat and drink. Per staff, he has been declining therapy.   Spoke with attending. Per conversation, attending will talk with ID and speak with family.   Given his diagnoses, I believe that if patient and family choose to proceed with comfort care and treating symptoms, he would be a candidate for a hospice facility with prognosis of < 6-8 weeks. If his oral intake is declining, he could be a candidate for hospice facility with prognosis of < 2 weeks.   Length of Stay: 32  Current Medications: Scheduled Meds:   sodium chloride   Intravenous Once   enoxaparin (LOVENOX) injection  40 mg Subcutaneous Q24H   feeding supplement  237 mL Oral TID BM   ferrous sulfate  325 mg Oral Q breakfast   folic acid  1 mg Oral Daily   methadone  20 mg Oral Daily   multivitamin with minerals  1 tablet Oral Daily   pantoprazole  40 mg Oral Daily   polyethylene glycol  17 g Per Tube Daily   senna  1 tablet Oral BID    Continuous Infusions:  sodium chloride 10 mL/hr at 02/19/22 1136   meropenem (MERREM) IV 2 g (02/23/22 0522)    PRN Meds: sodium chloride, acetaminophen, bisacodyl, docusate, haloperidol lactate, ipratropium-albuterol,  nicotine polacrilex, sodium phosphate, traZODone  Physical Exam Pulmonary:     Effort: Pulmonary effort is normal.  Neurological:     Mental Status: He is alert.             Vital Signs: BP 120/78 (BP Location: Right Arm)   Pulse 92   Temp 99.8 F (37.7 C) (Oral)   Resp 18   Wt 43.9 kg   SpO2 100%  BMI 14.67 kg/m  SpO2: SpO2: 100 % O2 Device: O2 Device: Room Air O2 Flow Rate:    Intake/output summary:  Intake/Output Summary (Last 24 hours) at 02/23/2022 1106 Last data filed at 02/23/2022 0900 Gross per 24 hour  Intake 734.07 ml  Output --  Net 734.07 ml   LBM: Last BM Date : 02/23/22 Baseline Weight: Weight: 53.2 kg Most recent weight: Weight: 43.9 kg        Flowsheet Rows    Flowsheet Row Most Recent Value  Intake Tab   Referral Department Hospitalist  Unit at Time of Referral Intermediate Care Unit  Palliative Care Primary Diagnosis Sepsis/Infectious Disease  Date Notified 01/25/22  Palliative Care Type New Palliative care  Reason for referral Clarify Goals of Care  Date of Admission 01/21/22  Date first seen by Palliative Care 01/26/22  # of days Palliative referral response time 1 Day(s)  # of days IP prior to Palliative referral 4  Clinical Assessment   Palliative Performance Scale Score 40%  Pain Max last 24 hours Not able to report  Pain Min Last 24 hours Not able to report  Dyspnea Max Last 24 Hours Not able to report  Dyspnea Min Last 24 hours Not able to report  Psychosocial & Spiritual Assessment   Palliative Care Outcomes        Patient Active Problem List   Diagnosis Date Noted   Hypophosphatemia 02/10/2022   Goals of care, counseling/discussion 02/10/2022   Cerebral abscess (embolic)    Ischemic cerebrovascular accident (CVA) ([place]) 02/08/2022   Normocytic anemia 01/27/2022   Thrombocytopenia ([place]) 01/27/2022   Osteomyelitis ([place])    Aortic valve endocarditis 01/24/2022   Acute HFrEF (heart failure with reduced ejection fraction)  ([place]) 01/24/2022   Septic shock ([place]) 01/22/2022   Altered mental status    Bacteremia    Hypokalemia 01/15/2022   Pelvic mass 01/15/2022   Behavioral problem 01/15/2022   Abscess of multiple sites    Drug abuse ([place])    Protein-calorie malnutrition, severe 02/11/2020   Type 2 diabetes mellitus ([place]) 02/09/2020   Hyperglycemia due to diabetes mellitus ([place]) 02/09/2020   Abscess 02/09/2020   Polysubstance (including opioids) dependence with physiol dependence ([place]) 02/09/2020   Hyponatremia 02/09/2020   Essential hypertension 02/09/2020   Cellulitis of left hand 06/02/2018    Palliative Care Assessment & Plan    Recommendations/Plan: Spoke with attending. Per conversation, attending will talk with ID and speak with family.   Given his diagnoses and overall status, I believe that if patient and family choose to proceed with comfort care and treating symptoms, he would be a candidate for a hospice facility with prognosis of < 6-8 weeks. If his oral intake is declining, he could be a candidate for hospice facility with prognosis of < 2 weeks.  Code Status:    Code Status Orders  (From admission, onward)           Start     Ordered   01/23/22 1236  Do not attempt resuscitation (DNR)  Continuous       Question Answer Comment  In the event of cardiac or respiratory ARREST Do not call a "code blue"   In the event of cardiac or respiratory ARREST Do not perform Intubation, CPR, defibrillation or ACLS   In the event of cardiac or respiratory ARREST Use medication by any route, position, wound care, and other measures to relive pain and suffering. May use oxygen, suction and manual treatment of airway obstruction  as needed for comfort.      01/23/22 1235           Code Status History     Date Active Date Inactive Code Status Order ID Comments User Context   01/22/2022 0500 01/23/2022 1235 Full Code [id number]  Rust-Chester, Huel Cote, NP ED   01/15/2022 0323 01/15/2022 2145 DNR  [id number]  Sidney Ace, Arvella Merles, MD ED   01/15/2022 0320 01/15/2022 0323 Full Code [id number]  Mansy, Arvella Merles, MD ED   11/29/2021 0951 12/02/2021 1556 Full Code [id number]  Collier Bullock, MD ED   10/19/2020 1226 10/20/2020 2117 Full Code [id number]  Collier Bullock, MD ED   02/09/2020 0823 02/14/2020 1942 Full Code [id number]  Collier Bullock, MD ED   02/09/2020 0559 02/09/2020 0822 Full Code [id number]  Athena Masse, MD ED   06/02/2018 2043 06/07/2018 1646 Full Code [id number]  Dustin Flock, MD Inpatient       Care plan was discussed with attending, RD, and nursing.   Thank you for allowing the Palliative Medicine Team to assist in the care of this patient.     Asencion Gowda, NP  Please contact Palliative Medicine Team phone at [phone] for questions and concerns.

## 2022-02-23 NOTE — Progress Notes (Signed)
Pt family (sister) asked if could bring in food this evening. Family informed calorie count in progress for patient. Informed the family that we must have  calorie figure for any foods brought to patient in order to provide information for calorie count study.

## 2022-02-23 NOTE — Progress Notes (Signed)
[facility]  - [place] [facility]   PATIENT NAME: Earl Moreno    MR#:  [id number]  DATE OF BIRTH:  05/18/1966  SUBJECTIVE:  patient seen earlier. No family at bedside Did not eat much BF C/o left shoulder pain Refusing meds and ambulation. Sleeping most of the time  VITALS:  Blood pressure 120/78, pulse 92, temperature 99.8 F (37.7 C), temperature source Oral, resp. rate 18, weight 43.9 kg, SpO2 100 %.  PHYSICAL EXAMINATION:   GENERAL:  56 y.o.-year-old patient Thin LUNGS: Normal breath sounds bilaterally CARDIOVASCULAR: S1, S2 normal.  ABDOMEN: Soft, nontender, nondistended. Bowel sounds present.  EXTREMITIES: No  edema b/l. Multiple tattoos over the upper body NEUROLOGIC: nonfocal  patient is alert and awake Tattoos +  LABORATORY PANEL:  CBC Recent Labs  Lab 02/22/22 0509  WBC 10.9*  HGB 9.1*  HCT 29.0*  PLT 237     Chemistries  Recent Labs  Lab 02/22/22 0509  NA 130*  K 4.8  CL 102  CO2 26  GLUCOSE 90  BUN 15  CREATININE 0.70  CALCIUM 8.3*  MG 2.3     Assessment and Plan  56 yo M with history of IVDA, recurrent hospitalization and leaving AMA, presenting [facility] ED on 01/21/22 via EMS complaining that he does not feel well.    Per chart review patient with multiple admissions for sepsis including pyelonephritis, GN bacteremia & suspected osteomyelitis in his right hip as recently as 01/15/22. There have been multiple attempts to treat with IV antibiotics & perform a TEE and rule out endocarditis but the patient continues to leave AMA. Patient has been in the hospital for more than four weeks. Please review chart for details.  Septic shock cerebral abscess /septic emboli --Patient initially admitted with severe sepsis and septic shock secondary to Serratia bacteremia and aortic valve endocarditis with his history of IV drug abuse. Multiple recent infections including pyelonephritis and osteomyelitis which were not treated  adequately as he was keeps leaving AMA. Patient was intubated and required pressors in the beginning.  Extubated on 01/25/2022 and was weaned off from pressors. --abx per ID  Serratia bacteremia with aortic valve endocarditis ID consulted. Repeat blood culture neg from 01/26/22 --cont meropenem --repeat Echo next week to assess for size of vegetation and then decide on oral Cipro, per ID -- per cardiothoracic consultation is not a candidate for aortic valve replacement due to his history of stroke --repeat echo 10/4--showed Same size of vegetation. ID recommends cont IV meropenem till 03/02/22 and change to po cipro for another month   Ischemic cerebrovascular accident (CVA) (HCC) Due to septic emboli. MRI brain on 01/22/22 showed Patchy small volume areas of diffusion signal abnormality with associated postcontrast enhancement involving the right greater than left parieto-occipital regions. Findings felt to be most consistent with evolving subacute ischemic infarcts. Associated mild petechial hemorrhage without significant regional mass effect.  --9/20 -  Repeat MRI brain ordered -- showed multiple brain abscesses and new septic emboli, likely causing infarcts --Repeat MRI 9/26 showed new foci of infarcts --Pt is at high risk for suffering life-threatening and debilitating infarcts if a large piece of septic emboli causes a large area of infarct.  Drug abuse (HCC) --UDS positive for cocaine and opioids   Osteomyelitis (HCC) --Right ischial tuberosity osteomyelitis on MRI which is little unusual site for osteo.  No abscess or overlying ulcer.  Positive blood cultures, most likely blood seeding. -On appropriate antibiotics   Chronic HFrEF (heart failure  with reduced ejection fraction) (HCC) --EF of 45 to 50% with global hypokinesis.  Decreased from his prior echocardiogram done in July 2023.  Most likely secondary to aortic endocarditis. --Clinically appears euvolemic.  Polysubstance  (including opioids) dependence with physiol dependence ([place]) History of IV opioid use for many years. Psych was consulted for concern of opioid withdrawal and he was started on methadone to prevent another AMA.   Patient on methadone 20 mg daily   Type 2 diabetes mellitus (HCC) --no need for BG checks and SSI   Normocytic anemia Most likely secondary to bone marrow suppression with sepsis.  Anemia panel shows anemia of chronic disease with some iron deficiency. --Continue with oral iron supplementation   Transfused 1 unit RBC's on 9/23 for Hgb is 6.7.  No obvious signs of bleeding. Hbg since transfusion: 8.9 >> 8.0 >> 7.5 --Trend Hbg, transfuse if < 7.0   Thrombocytopenia (HCC) Patient developed sudden onset thrombocytopenia to nadir of platelets 43k. HIT panel was negative. DIC labs were negative. --plt now back to normal   Protein-calorie malnutrition, severe Estimated body mass index is 15.67 kg/m as calculated from the following: Continue recommendations by dietitian --calorie count to be initiated. Pt's intake is variable  Left shoulder pain --CT shoulder--negative for RC or fluid collection --prn pin meds  palliative care consultation appreciated. Patient is DNR.  Discussed at length with patient's sister Earl Moreno who will be point of contact. Will continue medical management with antibiotic. Earl Moreno understands patient's has multiple medical issues and can deteriorate with his health condition any time. Patient remains a DNR. Will discuss with TOC regarding discharge planning. Pt has a grave prognosis   Family communication : Earl Moreno sister  Consults : ID CODE STATUS: DNR DVT Prophylaxis : Lovenox Level of care: Med-Surg Status is: Inpatient Remains inpatient appropriate because: continue treatment of sepsis and no place to go!  EDD--unknown    TOTAL TIME TAKING CARE OF THIS PATIENT: 25 minutes.  >50% time spent on counselling and coordination of care  Note:  This dictation was prepared with Dragon dictation along with smaller phrase technology. Any transcriptional errors that result from this process are unintentional.  Fritzi Mandes M.D    [facility]   CC: Primary care physician; Pcp, No

## 2022-02-24 LAB — MAGNESIUM: Magnesium: 2.5 mg/dL — ABNORMAL HIGH (ref 1.7–2.4)

## 2022-02-24 MED ORDER — GLYCOPYRROLATE 0.2 MG/ML IJ SOLN: 0.2000 mg | INTRAMUSCULAR | Status: DC | PRN

## 2022-02-24 MED ORDER — MORPHINE SULFATE (PF) 2 MG/ML IV SOLN
1.0000 mg | INTRAVENOUS | Status: DC | PRN
  Administered 2022-02-24 – 2022-02-25 (×2): 2 mg via INTRAVENOUS
  Filled 2022-02-24 (×2): qty 1

## 2022-02-24 MED ORDER — LORAZEPAM 2 MG/ML IJ SOLN
0.5000 mg | INTRAMUSCULAR | Status: DC | PRN
  Administered 2022-02-24: 1 mg via INTRAVENOUS
  Filled 2022-02-24: qty 1

## 2022-02-24 NOTE — Plan of Care (Signed)
?  Problem: Clinical Measurements: ?Goal: Will remain free from infection ?Outcome: Progressing ?Goal: Respiratory complications will improve ?Outcome: Progressing ?  ?Problem: Education: ?Goal: Knowledge of General Education information will improve ?Description: Including pain rating scale, medication(s)/side effects and non-pharmacologic comfort measures ?Outcome: Not Progressing ?  ?Problem: Health Behavior/Discharge Planning: ?Goal: Ability to manage health-related needs will improve ?Outcome: Not Progressing ?  ?Problem: Clinical Measurements: ?Goal: Ability to maintain clinical measurements within normal limits will improve ?Outcome: Not Progressing ?Goal: Diagnostic test results will improve ?Outcome: Not Progressing ?  ?

## 2022-02-24 NOTE — Progress Notes (Signed)
After discussion with me and palliative care family understands despite all the treatment and work up overall prognosis is very poor. Family decided to opt for comfort care. Orders give. To RN Hospice informed

## 2022-02-24 NOTE — Progress Notes (Signed)
[place] [facility] [place] NAME: Earl Moreno    MR#:  [id number]  DATE OF BIRTH:  1966/01/03  SUBJECTIVE:  patient seen earlier. Met with Earl Moreno and aunt Refusing meds and ambulation. Sleeping most of the time  Intermittent agitation Calorie count in progress VITALS:  Blood pressure 102/60, pulse 85, temperature 100.1 F (37.8 C), resp. rate 16, weight 41.8 kg, SpO2 98 %.  PHYSICAL EXAMINATION:   GENERAL:  56 y.o.-year-old patient Thin LUNGS: Normal breath sounds bilaterally CARDIOVASCULAR: S1, S2 normal.  ABDOMEN: Soft, nontender, nondistended. Bowel sounds present.  EXTREMITIES: No  edema b/l. Multiple tattoos over the upper body NEUROLOGIC: nonfocal  patient is alert and awake Tattoos +  LABORATORY PANEL:  CBC Recent Labs  Lab 02/22/22 0509  WBC 10.9*  HGB 9.1*  HCT 29.0*  PLT 237     Chemistries  Recent Labs  Lab 02/22/22 0509 02/24/22 0610  NA 130*  --   K 4.8  --   CL 102  --   CO2 26  --   GLUCOSE 90  --   BUN 15  --   CREATININE 0.70  --   CALCIUM 8.3*  --   MG 2.3 2.5*     Assessment and Plan  56 yo M with history of IVDA, recurrent hospitalization and leaving AMA, presenting to [facility] ED on 01/21/22 via EMS complaining that he does not feel well.    Per chart review patient with multiple admissions for sepsis including pyelonephritis, GN bacteremia & suspected osteomyelitis in his right hip as recently as 01/15/22. There have been multiple attempts to treat with IV antibiotics & perform a TEE and rule out endocarditis but the patient continues to leave AMA. Patient has been in the hospital for more than four weeks. Please review chart for details.  Septic shock cerebral abscess /septic emboli --Patient initially admitted with severe sepsis and septic shock secondary to Serratia bacteremia and aortic valve endocarditis with his history of IV drug abuse. Multiple recent infections including pyelonephritis and  osteomyelitis which were not treated adequately as he was keeps leaving AMA. Patient was intubated and required pressors in the beginning.  Extubated on 01/25/2022 and was weaned off from pressors. --abx per ID  Serratia bacteremia with aortic valve endocarditis ID consulted. Repeat blood culture neg from 01/26/22 --cont meropenem --repeat Echo next week to assess for size of vegetation and then decide on oral Cipro, per ID -- per cardiothoracic consultation is not a candidate for aortic valve replacement due to his history of stroke --repeat echo 10/4--showed Same size of vegetation. ID recommends cont IV meropenem till 03/02/22 and change to po cipro  --10/6-- spoke with patient's sister Earl Moreno and and at bedside. Patient has a poor prognosis. They also understand the results of the echocardiogram and persistent large vegetation which is at risk of getting reinfected and also risk for septic emboli. Overall prognosis remains poor. Family asking about comfort care. Discussed with them it is up to family to decide. He has been having discussions with palliative care   Ischemic cerebrovascular accident (CVA) ([place]) Due to septic emboli. MRI brain on 01/22/22 showed Patchy small volume areas of diffusion signal abnormality with associated postcontrast enhancement involving the right greater than left parieto-occipital regions. Findings felt to be most consistent with evolving subacute ischemic infarcts. Associated mild petechial hemorrhage without significant regional mass effect.  --9/20 -  Repeat MRI brain ordered -- showed multiple brain abscesses and new  septic emboli, likely causing infarcts --Repeat MRI 9/26 showed new foci of infarcts --Pt is at high risk for suffering life-threatening and debilitating infarcts if a large piece of septic emboli causes a large area of infarct.  Drug abuse ([place]) --UDS positive for cocaine and opioids   Osteomyelitis ([place]) --Right ischial tuberosity osteomyelitis  on MRI which is little unusual site for osteo.  No abscess or overlying ulcer.  Positive blood cultures, most likely blood seeding.   Chronic HFrEF (heart failure with reduced ejection fraction) (HCC) --EF of 45 to 50% with global hypokinesis.  Decreased from his prior echocardiogram done in July 2023.  Most likely secondary to aortic endocarditis. --Clinically appears euvolemic.  Polysubstance (including opioids) dependence with physiol dependence ([place]) History of IV opioid use for many years. Psych was consulted for concern of opioid withdrawal and he was started on methadone to prevent another AMA.   Patient on methadone 20 mg daily   Type 2 diabetes mellitus (HCC) --no need for BG checks and SSI   Normocytic anemia Most likely secondary to bone marrow suppression with sepsis.  Anemia panel shows anemia of chronic disease with some iron deficiency. --Continue with oral iron supplementation   Transfused 1 unit RBC's on 9/23 for Hgb is 6.7.  No obvious signs of bleeding. Hbg since transfusion: 8.9 >> 8.0 >> 7.5 --Trend Hbg, transfuse if < 7.0   Thrombocytopenia (HCC) Patient developed sudden onset thrombocytopenia to nadir of platelets 43k. HIT panel was negative. DIC labs were negative. --plt now back to normal   Protein-calorie malnutrition, severe Estimated body mass index is 15.67 kg/m as calculated from the following: Continue recommendations by dietitian --calorie count to be initiated. Pt's intake is variable  Left shoulder pain --CT shoulder--negative for RC or fluid collection --prn pin meds  palliative care consultation appreciated. Patient is DNR.  patient remains a DNR. Pt has a grave prognosis.    Family communication : [name] sister  Consults : ID CODE STATUS: DNR DVT Prophylaxis : Lovenox Level of care: Med-Surg Status is: Inpatient overall prognosis. Patient still getting IV antibiotics. At present patient does not have any definite discharge  plan. EDD--unknown    TOTAL TIME TAKING CARE OF THIS PATIENT: 25 minutes.  >50% time spent on counselling and coordination of care  Note: This dictation was prepared with Dragon dictation along with smaller phrase technology. Any transcriptional errors that result from this process are unintentional.  Fritzi Mandes M.D    [facility]   CC: Primary care physician; Pcp, No

## 2022-02-24 NOTE — TOC Progression Note (Signed)
Transition of Care [facility]) - Progression Note    Patient Details  Name: Earl Moreno MRN: [id number] Date of Birth: 1965-12-24  Transition of [facility]) CM/SW Contact  Laurena Slimmer, RN Phone Number: 02/24/2022, 3:03 PM  Clinical Narrative:    Damaris Schooner with Stefanie Libel from [place]. She stated a signed letter stating patient is incapacitated is needed to proceed with medicaid application. Attending MD signed letter. Letter was sent. Confirm letter was received by Marlan [place] assistance.    Per Palliative NP, Asencion Gowda, family is interested in hospice. Referral sent to Flo Shanks. From [facility].   Expected Discharge Plan: [place] Barriers to Discharge: Continued Medical Work up  Expected Discharge Plan and Services Expected Discharge Plan: [place]   Discharge Planning Services: CM Consult   Living arrangements for the past 2 months: Apartment, Homeless                 DME Arranged: N/A DME Agency: NA                   Social Determinants of Health (SDOH) Interventions    Readmission Risk Interventions    01/23/2022   10:21 AM  Readmission Risk Prevention Plan  Transportation Screening Complete  Medication Review ([place]) Complete  PCP or Specialist appointment within 3-5 days of discharge Complete  HRI or [place] Complete  SW Recovery Care/Counseling Consult Complete  [place] Not Applicable

## 2022-02-24 NOTE — Progress Notes (Signed)
PT Cancellation Note  Patient Details Name: Earl Moreno MRN: [id number] DOB: Jul 23, 1965   Cancelled Treatment:    Reason Eval/Treat Not Completed: Medical issues which prohibited therapy;Patient not medically ready (Per chart pt is medically declining, PMT now involved, careteam reassessing goals of care. Will sign off at this time. Please reconsult our services if POC should change.)   2:31 PM, 02/24/22 Etta Grandchild, PT, DPT Physical Therapist - [facility]  [phone] ([place])   [place] C 02/24/2022, 2:28 PM

## 2022-02-24 NOTE — Progress Notes (Addendum)
Daily Progress Note   Patient Name: Earl Moreno       Date: 02/24/2022 DOB: 10-Oct-1965  Age: 56 y.o. MRN#: [id number] Attending Physician: Fritzi Mandes, MD Primary Care Physician: Pcp, No Admit Date: 01/21/2022  Reason for Consultation/Follow-up: Establishing goals of care  Subjective: Spoke with attending. Discussed poor prognosis and suffering. Discussed comfort focused care. Discussed that I would talk to family.   Spoke with [employer]. He is tired and sleepy. Discussed his status and prognosis. He states he wants [place] to make the decision on care. Discussed thoughts on comfort focus. He states this would be fine. Discussed calling [name] to discuss.   [name] entered shortly after to bedside.  She stepped out to speak with me as patient was sitting up in bed working with nursing.  She has been updated by primary team.  We discussed Earl Moreno's status and the conversation he had not had.  We discussed prognosis and quality of life.  She is amenable to transitioning to comfort and hospice facility placement.  Discussed alerting family members so that they would know the plans moving forward.  Patient has previously indicated he would want [name] to be his primary healthcare decision maker.  We spoke to son Edison Nasuti during previous meeting and Edison Nasuti was in favor of comfort care until death.  Family discussed their concern for daughter Trixie's ability to make decisions for her father as Sela Hilding has significant illicit drug use. We attempted to call daughter Trixie multiple times at time of last meeting unsuccessfully.  We attempted to call daughter Trixie at this time, but still unable to reach her.  Trixie shares her grandmother's cell phone.  I attempted to call son [name] at the number we have on file x2, but phone  was off and went directly to a generic voicemail. [name] confirms she does not have a way to reach son [name] and states the phone number that we have is no longer his phone number.  Spoke with Earl Moreno and sister together.  His questions were answered along with her questions regarding comfort care and placement.  Patient signed a MOST form, sister signed as well.  I completed a MOST form today and the signed original was placed in the chart. The form was scanned and sent to medical records for it to be uploaded under  ACP tab in Epic. A photocopy was also placed in the chart to be scanned into EMR. The patient outlined their wishes for the following treatment decisions:  Cardiopulmonary Resuscitation: Do Not Attempt Resuscitation (DNR/No CPR)  Medical Interventions: Comfort Measures: Keep clean, warm, and dry. Use medication by any route, positioning, wound care, and other measures to relieve pain and suffering. Use oxygen, suction and manual treatment of airway obstruction as needed for comfort. Do not transfer to the hospital unless comfort needs cannot be met in current location.  Antibiotics: No antibiotics (use other measures to relieve symptoms)  IV Fluids: No IV fluids (provide other measures to ensure comfort)  Feeding Tube: No feeding tube     Length of Stay: 33  Current Medications: Scheduled Meds:   sodium chloride   Intravenous Once   enoxaparin (LOVENOX) injection  40 mg Subcutaneous Q24H   feeding supplement  237 mL Oral TID BM   ferrous sulfate  325 mg Oral Q breakfast   folic acid  1 mg Oral Daily   methadone  20 mg Oral Daily   multivitamin with minerals  1 tablet Oral Daily   pantoprazole  40 mg Oral Daily   polyethylene glycol  17 g Per Tube Daily   senna  1 tablet Oral BID    Continuous Infusions:  sodium chloride 10 mL/hr at 02/19/22 1136   meropenem (MERREM) IV 2 g (02/24/22 0615)    PRN Meds: sodium chloride, acetaminophen, bisacodyl, docusate, haloperidol  lactate, ipratropium-albuterol, nicotine polacrilex, sodium phosphate, traZODone  Physical Exam Pulmonary:     Effort: Pulmonary effort is normal.  Neurological:     Mental Status: He is alert.             Vital Signs: BP 102/60 (BP Location: Right Arm)   Pulse 85   Temp 100.1 F (37.8 C)   Resp 16   Wt 41.8 kg   SpO2 98%   BMI 13.97 kg/m  SpO2: SpO2: 98 % O2 Device: O2 Device: Room Air O2 Flow Rate:    Intake/output summary:  Intake/Output Summary (Last 24 hours) at 02/24/2022 1021 Last data filed at 02/23/2022 1932 Gross per 24 hour  Intake 240 ml  Output --  Net 240 ml   LBM: Last BM Date : 02/23/22 Baseline Weight: Weight: 53.2 kg Most recent weight: Weight: 41.8 kg  Flowsheet Rows    Flowsheet Row Most Recent Value  Intake Tab   Referral Department Hospitalist  Unit at Time of Referral Intermediate Care Unit  Palliative Care Primary Diagnosis Sepsis/Infectious Disease  Date Notified 01/25/22  Palliative Care Type New Palliative care  Reason for referral Clarify Goals of Care  Date of Admission 01/21/22  Date first seen by Palliative Care 01/26/22  # of days Palliative referral response time 1 Day(s)  # of days IP prior to Palliative referral 4  Clinical Assessment   Palliative Performance Scale Score 40%  Pain Max last 24 hours Not able to report  Pain Min Last 24 hours Not able to report  Dyspnea Max Last 24 Hours Not able to report  Dyspnea Min Last 24 hours Not able to report  Psychosocial & Spiritual Assessment   Palliative Care Outcomes        Patient Active Problem List   Diagnosis Date Noted   Hypophosphatemia 02/10/2022   Goals of care, counseling/discussion 02/10/2022   Cerebral abscess (embolic)    Ischemic cerebrovascular accident (CVA) ([place]) 02/08/2022   Normocytic anemia 01/27/2022   Thrombocytopenia (  [place]) 01/27/2022   Osteomyelitis ([place])    Aortic valve endocarditis 01/24/2022   Acute HFrEF (heart failure with reduced ejection  fraction) ([place]) 01/24/2022   Septic shock ([place]) 01/22/2022   Altered mental status    Bacteremia    Hypokalemia 01/15/2022   Pelvic mass 01/15/2022   Behavioral problem 01/15/2022   Abscess of multiple sites    Drug abuse ([place])    Protein-calorie malnutrition, severe 02/11/2020   Type 2 diabetes mellitus ([place]) 02/09/2020   Hyperglycemia due to diabetes mellitus ([place]) 02/09/2020   Abscess 02/09/2020   Polysubstance (including opioids) dependence with physiol dependence ([place]) 02/09/2020   Hyponatremia 02/09/2020   Essential hypertension 02/09/2020   Cellulitis of left hand 06/02/2018    Palliative Care Assessment & Plan     Recommendations/Plan: Comfort care with hospice facility placement.   Code Status:    Code Status Orders  (From admission, onward)           Start     Ordered   01/23/22 1236  Do not attempt resuscitation (DNR)  Continuous       Question Answer Comment  In the event of cardiac or respiratory ARREST Do not call a "code blue"   In the event of cardiac or respiratory ARREST Do not perform Intubation, CPR, defibrillation or ACLS   In the event of cardiac or respiratory ARREST Use medication by any route, position, wound care, and other measures to relive pain and suffering. May use oxygen, suction and manual treatment of airway obstruction as needed for comfort.      01/23/22 1235           Code Status History     Date Active Date Inactive Code Status Order ID Comments User Context   01/22/2022 0500 01/23/2022 1235 Full Code [id number]  Rust-Chester, Huel Cote, NP ED   01/15/2022 0323 01/15/2022 2145 DNR [id number]  Sidney Ace, Arvella Merles, MD ED   01/15/2022 0320 01/15/2022 0323 Full Code [id number]  Mansy, Arvella Merles, MD ED   11/29/2021 0951 12/02/2021 1556 Full Code [id number]  Collier Bullock, MD ED   10/19/2020 1226 10/20/2020 2117 Full Code [id number]  Collier Bullock, MD ED   02/09/2020 0823 02/14/2020 1942 Full Code [id number]  Collier Bullock, MD ED   02/09/2020 0559  02/09/2020 0822 Full Code [id number]  Athena Masse, MD ED   06/02/2018 2043 06/07/2018 1646 Full Code [id number]  Dustin Flock, MD Inpatient       Prognosis:  weeks    Thank you for allowing the Palliative Medicine Team to assist in the care of this patient.     Asencion Gowda, NP  Please contact Palliative Medicine Team phone at [phone] for questions and concerns.

## 2022-02-24 NOTE — Progress Notes (Signed)
Pt sitting with stool. Attempted to assist pt to BR. Pt refused to stand up, swinging his arms at nurse yelling -"get off of me, leave me alone". RN backed up away from pt. Family upset because pt in stool. RN explained she could not force pt to get up, nor could she put her own safety at risk trying to get him to get up. Pt remains as was. Will continue to monitor and encourage pt to allow [place] to help him clean up with intentional rounding.

## 2022-02-24 NOTE — Progress Notes (Signed)
[facility] Liaison note:  Referral received from Palliative NP Asencion Gowda for family interest in [facility] home in [place]. TOC Mariea Clonts made aware.  Writer met with patient's sister [name] to initiate education regarding hospice services, philosophy and team approach to care with understanding voiced. Hospice information given to [facility].  Patient has been approved by hospice physician Dr. Eulas Post..   Attending Dr. Posey Pronto and family made aware that the [facility] hospital liaison will follow up tomorrow 10/7 regarding bed availability.  Thank you for the opportunity to be involved in the care of this patient and his family.  Flo Shanks BSN, [facility] [employer] [phone]

## 2022-02-24 NOTE — Progress Notes (Signed)
PT is now comfort care ID will sign off

## 2022-02-25 LAB — HEPATITIS C GENOTYPE

## 2022-02-25 MED ORDER — MORPHINE SULFATE (PF) 2 MG/ML IV SOLN: 1.0000 mg | INTRAVENOUS | 0 refills | Status: AC | PRN

## 2022-02-25 MED ORDER — LORAZEPAM 2 MG/ML IJ SOLN: 0.5000 mg | INTRAMUSCULAR | 0 refills | Status: AC | PRN

## 2022-02-25 MED ORDER — SCOPOLAMINE 1 MG/3DAYS TD PT72: 1.0000 | MEDICATED_PATCH | TRANSDERMAL | 12 refills | Status: AC

## 2022-02-25 NOTE — Progress Notes (Signed)
MD order received in [facility] to discharge pt to the [place] today;  Hospital previously prepared paperwork and placed in discharge envelope; report called to Judson Roch at the [facility] [phone], no further questions voiced at this time; discharge pending arrival of EMS for nonemergency transport

## 2022-02-25 NOTE — Progress Notes (Signed)
[facility] Liaison Note  Bed offer made and family accepted bed offer.  Able to transport to the hospice home today once consents are completed.    Hospital team notified.  Dimas Aguas RN [phone]

## 2022-02-25 NOTE — Plan of Care (Signed)

## 2022-02-25 NOTE — Progress Notes (Signed)
EMS present for pt discharge; discharge packet given to EMS personnel along with verbal report; pt discharged via stretcher by EMS personnel to the [place]

## 2022-02-25 NOTE — Progress Notes (Signed)
[employer] Liaison Note  Consents have been completed by patient's sister, [name].  Notified Barbie TOC and Robyn RN transport can be arranged and to please call report to the hospice home at [phone].  Thank you for allowing participation in patient's care. Dimas Aguas, RN [phone]

## 2022-02-25 NOTE — TOC Progression Note (Addendum)
Transition of Care [facility]) - Progression Note    Patient Details  Name: Earl Moreno MRN: [id number] Date of Birth: [birth year]  Transition of Care [facility]) CM/SW Contact  Izola Price, RN Phone Number: 02/25/2022, 10:03 AM  Clinical Narrative:  02/25/22: Contacted hospice liaison on call today at [phone] to check on bed availability and they will get in touch with TOC when bed availability is confirmed.  Updated provider as to status. Simmie Davies RN CM   1022 am: [facility] [employer] home in [place] has accepted for a bed that is available today. Liaison obtaining consents. RN CM will complete and print to unit the EMS forms excluding the DNR form to be completed by provider. Simmie Davies RN CM   205 pm [facility] called for transport. Simmie Davies RN CM   Expected Discharge Plan: Skilled Nursing Facility Barriers to Discharge: Continued Medical Work up  Expected Discharge Plan and Services Expected Discharge Plan: [place]   Discharge Planning Services: CM Consult   Living arrangements for the past 2 months: Apartment, Homeless                 DME Arranged: N/A DME Agency: NA                   Social Determinants of Health (SDOH) Interventions    Readmission Risk Interventions    01/23/2022   10:21 AM  Readmission Risk Prevention Plan  Transportation Screening Complete  Medication Review ([place]) Complete  PCP or Specialist appointment within 3-5 days of discharge Complete  HRI or [place] Complete  SW Recovery Care/Counseling Consult Complete  [place] Not Applicable

## 2022-02-25 NOTE — Discharge Summary (Signed)
Physician Discharge Summary   Patient: Earl Moreno MRN: [id number] DOB: 16-Apr-1966  Admit date:     01/21/2022  Discharge date: 02/25/22  Discharge Physician: Fritzi Mandes   PCP: Pcp, No    Discharge Diagnoses: Principal Problem:   Septic shock ([place]) Active Problems:   Ischemic cerebrovascular accident (CVA) ([place])   Cerebral abscess (embolic)   Bacteremia   Aortic valve endocarditis   Drug abuse ([place])   Altered mental status   Osteomyelitis ([place])   Essential hypertension   Acute HFrEF (heart failure with reduced ejection fraction) (HCC)   Polysubstance (including opioids) dependence with physiol dependence ([place])   Type 2 diabetes mellitus (HCC)   Normocytic anemia   Thrombocytopenia (HCC)   Protein-calorie malnutrition, severe   Hypophosphatemia   Goals of care, counseling/discussion   Hospital Course:  56 yo M with history of IVDA, recurrent hospitalization and leaving AMA, presenting to [facility] ED on 01/21/22 via EMS complaining that he does not feel well.    Per chart review patient with multiple admissions for sepsis including pyelonephritis, GN bacteremia & suspected osteomyelitis in his right hip as recently as 01/15/22. There have been multiple attempts to treat with IV antibiotics & perform a TEE and rule out endocarditis but the patient continues to leave AMA. Patient has been in the hospital for more than four weeks. Please review chart for details.   Septic shock cerebral abscess /septic emboli --Patient initially admitted with severe sepsis and septic shock secondary to Serratia bacteremia and aortic valve endocarditis with his history of IV drug abuse. Multiple recent infections including pyelonephritis and osteomyelitis which were not treated adequately as he was keeps leaving AMA. Patient was intubated and required pressors in the beginning.  Extubated on 01/25/2022 and was weaned off from pressors. --abx per ID   Serratia bacteremia with aortic valve  endocarditis ID consulted. Repeat blood culture neg from 01/26/22 --cont meropenem --repeat Echo next week to assess for size of vegetation and then decide on oral Cipro, per ID -- per cardiothoracic consultation is not a candidate for aortic valve replacement due to his history of stroke --repeat echo 10/4--showed Same size of vegetation. ID recommends cont IV meropenem till 03/02/22 and change to po cipro  --10/6-- spoke with patient's sister [name] and and at bedside. Patient has a poor prognosis. They also understand the results of the echocardiogram and persistent large vegetation which is at risk of getting reinfected and also risk for septic emboli. Overall prognosis remains poor. Family asking about comfort care. Discussed with them it is up to family to decide. They have been having discussions with palliative care. --10/7-- pt is now on Comfort care. Awaiting to reach family to sign paper work for it. Left VM    Ischemic cerebrovascular accident (CVA) ([place]) Due to septic emboli. MRI brain on 01/22/22 showed Patchy small volume areas of diffusion signal abnormality with associated postcontrast enhancement involving the right greater than left parieto-occipital regions. Findings felt to be most consistent with evolving subacute ischemic infarcts. Associated mild petechial hemorrhage without significant regional mass effect.  --9/20 -  Repeat MRI brain ordered -- showed multiple brain abscesses and new septic emboli, likely causing infarcts --Repeat MRI 9/26 showed new foci of infarcts --Pt is at high risk for suffering life-threatening and debilitating infarcts if a large piece of septic emboli causes a large area of infarct.   Drug abuse ([place]) --UDS positive for cocaine and opioids   Osteomyelitis ([place]) --Right ischial tuberosity osteomyelitis on MRI  Chronic HFrEF (heart failure with reduced ejection fraction) (HCC) --EF of 45 to 50% with global hypokinesis.  Decreased from his prior  echocardiogram done in July 2023.  Most likely secondary to aortic endocarditis. --Clinically appears euvolemic.   Polysubstance (including opioids) dependence with physiol dependence ([place]) History of IV opioid use for many years. Psych was consulted for concern of opioid withdrawal and he was started on methadone to prevent another AMA.   Type 2 diabetes mellitus (HCC) --no need for BG checks and SSI   Normocytic anemia Most likely secondary to bone marrow suppression with sepsis.  Anemia panel shows anemia of chronic disease with some iron deficiency.  Transfused 1 unit RBC's on 9/23 for Hgb is 6.7.    Thrombocytopenia ([place]) Patient developed sudden onset thrombocytopenia to nadir of platelets 43k. HIT panel was negative. DIC labs were negative. --plt now back to normal   Protein-calorie malnutrition, severe Estimated body mass index is 15.67 kg/m as calculated from the following:  Left shoulder pain --CT shoulder--negative for RC or fluid collection --prn pain meds   palliative care consultation appreciated. Patient is DNR.   Pt has a grave prognosis.       Family communication : spoke with son Edison Nasuti to have [place] callback Consults : ID CODE STATUS: DNR DVT Prophylaxis : Lovenox Level of care: Med-Surg        Disposition: Hospice care Diet recommendation:  Discharge Diet Orders (From admission, onward)     Start     Ordered   02/25/22 0000  Diet - low sodium heart healthy        02/25/22 1041            DISCHARGE MEDICATION: Allergies as of 02/25/2022   No Active Allergies      Medication List     STOP taking these medications    amLODipine 10 MG tablet Commonly known as: NORVASC   Baclofen 5 MG Tabs   Blood Glucose Monitor System w/Device Kit   HumaLOG Mix 75/25 KwikPen (75-25) 100 UNIT/ML Kwikpen Generic drug: Insulin Lispro Prot & Lispro   Rightest GL300 Lancets Misc   Rightest GM550 Blood Glucose w/Device Kit   Rightest GS550  Blood Glucose test strip Generic drug: glucose blood   Unifine Pentips 32G X 4 MM Misc Generic drug: Insulin Pen Needle       TAKE these medications    LORazepam 2 MG/ML injection Commonly known as: ATIVAN Inject 0.25-0.5 mLs (0.5-1 mg total) into the vein every 4 (four) hours as needed for anxiety.   morphine (PF) 2 MG/ML injection Inject 0.5-1 mLs (1-2 mg total) into the vein every 4 (four) hours as needed (shortness of breath).   scopolamine 1 MG/3DAYS Commonly known as: TRANSDERM-SCOP Place 1 patch (1.5 mg total) onto the skin every 3 (three) days.               Discharge Care Instructions  (From admission, onward)           Start     Ordered   02/25/22 0000  Discharge wound care:       Comments: Foam padding as needed   02/25/22 1041            Condition at discharge: poor  The results of significant diagnostics from this hospitalization (including imaging, microbiology, ancillary and laboratory) are listed below for reference.   Imaging Studies: CT SHOULDER LEFT W CONTRAST  Result Date: 02/23/2022 CLINICAL DATA:  Left shoulder pain. Bacteremia. History of  IV drug abuse EXAM: CT OF THE UPPER LEFT EXTREMITY WITH CONTRAST TECHNIQUE: Multidetector CT imaging of the left shoulder was performed according to the standard protocol following intravenous contrast administration. RADIATION DOSE REDUCTION: This exam was performed according to the departmental dose-optimization program which includes automated exposure control, adjustment of the mA and/or kV according to patient size and/or use of iterative reconstruction technique. CONTRAST:  64m OMNIPAQUE IOHEXOL 300 MG/ML  SOLN COMPARISON:  None Available. FINDINGS: Bones/Joint/Cartilage No acute fracture. No dislocation. Glenohumeral joint space is preserved. No appreciable glenohumeral joint effusion is seen. Mild degenerative changes of the acromioclavicular joint. No large subacromial-subdeltoid bursal fluid  collection. Remaining visualized osseous structures appear grossly intact. No erosion or periosteal elevation. Ligaments Suboptimally assessed by CT. Muscles and Tendons Preserved muscle bulk of the rotator cuff musculature without significant atrophy or fatty infiltration. Rotator cuff tendons appear grossly intact within the limitations of CT. Soft tissues No collection is identified within the soft tissues. No axillary lymphadenopathy. Cachectic body habitus with lack of subcutaneous fat. Small bilateral pleural effusions. Thoracic aortic atherosclerosis. IMPRESSION: 1. No acute osseous abnormality of the left shoulder. 2. No findings to suggest septic arthritis or osteomyelitis by CT. 3. Small bilateral pleural effusions. Aortic Atherosclerosis (ICD10-I70.0). Electronically Signed   By: NDavina PokeD.O.   On: 02/23/2022 10:20   ECHOCARDIOGRAM COMPLETE  Result Date: 02/21/2022    ECHOCARDIOGRAM REPORT   Patient Name:   Earl MALKIEWICZDate of Exam: 02/21/2022 Medical Rec #:  [id number]      Height:       68.1 in Accession #:    [id number]     Weight:       97.4 lb Date of Birth:  808-15-1967       BSA:          1.507 m Patient Age:    89years        BP:           137/62 mmHg Patient Gender: M               HR:           92 bpm. Exam Location:  [facility] Procedure: 2D Echo, Cardiac Doppler and Color Doppler Indications:     Endocarditis I38  History:         Patient has prior history of Echocardiogram examinations, most                  recent 02/08/2022. Risk Factors:Hypertension and Diabetes.  Sonographer:     JSherrie SportReferring Phys:  [id number] BillingDiagnosing Phys: CNelva BushMD  Sonographer Comments: Image acquisition challenging due to patient body habitus. IMPRESSIONS  1. Left ventricular ejection fraction, by estimation, is 50 to 55%. The left ventricle has low normal function. The left ventricle has no regional wall motion abnormalities. Left ventricular diastolic parameters were  normal.  2. Right ventricular systolic function is normal. The right ventricular size is normal. There is moderately elevated pulmonary artery systolic pressure.  3. Moderate pleural effusion in the left lateral region.  4. The mitral valve is abnormal. Moderate mitral valve regurgitation.  5. Tricuspid valve regurgitation is mild to moderate.  6. Large vegetation again noted on the aortic valve, measuring approximately 1.8 x 1.3 cm. 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 The aortic valve is abnormal. Aortic valve regurgitation is severe. No aortic stenosis is present.  7. The inferior vena cava is dilated in size  with <50% respiratory variability, suggesting right atrial pressure of 15 mmHg. Comparison(s): A prior study was performed on 02/08/2022. No significant change from prior study. FINDINGS  Left Ventricle: Left ventricular ejection fraction, by estimation, is 50 to 55%. The left ventricle has low normal function. The left ventricle has no regional wall motion abnormalities. The left ventricular internal cavity size was normal in size. There is borderline left ventricular hypertrophy. Left ventricular diastolic parameters were normal. Right Ventricle: The right ventricular size is normal. No increase in right ventricular wall thickness. Right ventricular systolic function is normal. There is moderately elevated pulmonary artery systolic pressure. The tricuspid regurgitant velocity is 2.82 m/s, and with an assumed right atrial pressure of 15 mmHg, the estimated right ventricular systolic pressure is 18.8 mmHg. Left Atrium: Left atrial size was normal in size. Right Atrium: Right atrial size was normal in size. Pericardium: Trivial pericardial effusion is present. Mitral Valve: The mitral valve is abnormal. There is mild thickening of the mitral valve leaflet(s). Moderate mitral valve regurgitation. Tricuspid Valve: The tricuspid valve is normal in structure. Tricuspid valve regurgitation is  mild to moderate. Aortic Valve: Large vegetation again noted on the aortic valve, measuring approximately 1.8 x 1.3 cm. 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 The aortic valve is abnormal. Aortic valve regurgitation is severe. Aortic regurgitation PHT measures 194 msec. No aortic stenosis is present. Aortic valve mean gradient measures 3.0 mmHg. Aortic valve peak gradient measures 5.2 mmHg. Aortic valve area, by VTI measures 2.69 cm. Pulmonic Valve: The pulmonic valve was not well visualized. Aorta: The aortic root is normal in size and structure. Pulmonary Artery: The pulmonary artery is not well seen. Venous: The inferior vena cava is dilated in size with less than 50% respiratory variability, suggesting right atrial pressure of 15 mmHg. IAS/Shunts: No atrial level shunt detected by color flow Doppler. Additional Comments: There is a moderate pleural effusion in the left lateral region.  LEFT VENTRICLE PLAX 2D LVIDd:         4.77 cm   Diastology LVIDs:         3.57 cm   LV e' medial:    10.90 cm/s LV PW:         0.85 cm   LV E/e' medial:  7.4 LV IVS:        0.90 cm   LV e' lateral:   11.90 cm/s LVOT diam:     2.00 cm   LV E/e' lateral: 6.7 LV SV:         39 LV SV Index:   26 LVOT Area:     3.14 cm  RIGHT VENTRICLE RV Basal diam:  4.30 cm RV Mid diam:    3.70 cm LEFT ATRIUM             Index        RIGHT ATRIUM           Index LA diam:        3.60 cm 2.39 cm/m   RA Area:     12.20 cm LA Vol (A2C):   45.3 ml 30.05 ml/m  RA Volume:   27.60 ml  18.31 ml/m LA Vol (A4C):   47.3 ml 31.38 ml/m LA Biplane Vol: 47.9 ml 31.77 ml/m  AORTIC VALVE AV Area (Vmax):    2.42 cm AV Area (Vmean):   2.49 cm AV Area (VTI):     2.69 cm AV Vmax:           114.00  cm/s AV Vmean:          71.400 cm/s AV VTI:            0.146 m AV Peak Grad:      5.2 mmHg AV Mean Grad:      3.0 mmHg LVOT Vmax:         87.80 cm/s LVOT Vmean:        56.600 cm/s LVOT VTI:          0.125 m LVOT/AV VTI ratio: 0.86 AI PHT:             194 msec  AORTA Ao Root diam: 3.10 cm MITRAL VALVE               TRICUSPID VALVE MV Area (PHT): 5.66 cm    TR Peak grad:   31.8 mmHg MV Decel Time: 134 msec    TR Vmax:        282.00 cm/s MV E velocity: 80.20 cm/s MV A velocity: 44.00 cm/s  SHUNTS MV E/A ratio:  1.82        Systemic VTI:  0.12 m                            Systemic Diam: 2.00 cm Nelva Bush MD Electronically signed by Nelva Bush MD Signature Date/Time: 02/21/2022/12:10:40 PM    Final    MR BRAIN W WO CONTRAST  Result Date: 02/14/2022 CLINICAL DATA:  Concern for abscesses and increased septic emboli EXAM: MRI HEAD WITHOUT AND WITH CONTRAST TECHNIQUE: Multiplanar, multiecho pulse sequences of the brain and surrounding structures were obtained without and with intravenous contrast. CONTRAST:  5 mL Vueway COMPARISON:  02/08/2022 FINDINGS: Brain: New areas of restricted diffusion with ADC correlate in the right cerebellum (series 5, images 6-12), which do not correlate with contrast enhancement. Redemonstrated patchy areas of restricted diffusion in the right parietal lobe, which correlate with rim enhancing lesions and appear overall similar to the prior exam when accounting for differences in slice thickness. New curvilinear enhancement in the left occipital lobe (series 18, image 65-67), which is not definitively associated with restricted diffusion or significant edema. Redemonstrated foci of restricted diffusion without enhancement in the occipital lobes bilaterally, likely in the subependymal occipital horn (series 5, image 21). Redemonstrated chronic microhemorrhage in the left occipital lobe and right lateral cerebellum. Overall unchanged hemorrhage associated with the right parietal lesions. No definite hemorrhage associated with the new left occipital enhancement. No hydrocephalus. Vascular: Normal arterial flow voids. Normal arterial and venous enhancement. Skull and upper cervical spine: Normal marrow signal. Sinuses/Orbits: No  acute finding. Other: Fluid in the right mastoid air cells. IMPRESSION: 1. New foci of restricted diffusion with ADC correlate in the right cerebellum, which do not correlate with contrast enhancement and are concerning for acute and/or early subacute infarcts. These are adjacent to but do not correlate specifically with previously noted microhemorrhage in the right cerebellum. 2. New foci of enhancement in the left occipital lobe, which are not definitively associated with restricted diffusion or significant edema but may represent a new area of abscess formation. Attention on follow-up. 3. Redemonstrated ring enhancing right parietal lesions with surrounding edema, overall unchanged when accounting for differences in slice thickness, which remain concerning for abscesses. Electronically Signed   By: Merilyn Baba M.D.   On: 02/14/2022 22:37   ECHOCARDIOGRAM COMPLETE  Result Date: 02/08/2022    ECHOCARDIOGRAM REPORT   Patient Name:   Earl  L Moreno Date of Exam: 02/08/2022 Medical Rec #:  [id number]       Height:       68.1 in Accession #:    [id number]      Weight:       104.1 lb Date of Birth:  [birth year]        BSA:          1.550 m Patient Age:    15 years        BP:           130/83 mmHg Patient Gender: M               HR:           79 bpm. Exam Location:  [facility] Procedure: 2D Echo, Color Doppler and Cardiac Doppler Indications:     I38 Endocarditis  History:         Patient has prior history of Echocardiogram examinations, most                  recent 01/22/2022. Risk Factors:Diabetes.  Sonographer:     Charmayne Sheer Referring Phys:  [id number] Claiborne Billings A GRIFFITH Diagnosing Phys: Nelva Bush MD  Sonographer Comments: Suboptimal apical window. Image acquisition challenging due to patient body habitus, Image acquisition challenging due to patient behavioral factors. and Image acquisition challenging due to uncooperative patient. IMPRESSIONS  1. Left ventricular ejection fraction, by estimation, is 50 to 55%. The left  ventricle has low normal function. The left ventricle has no regional wall motion abnormalities. Left ventricular diastolic parameters were normal.  2. There is turbulent flow in the right ventricular outflow tract just below the pulmonic valve adjacent to the infected aortic valve. While this could represent worsening pulmonic regurgitation, it is concerning for fistula formation from the left coronary cusp into the RVOT. Right ventricular systolic function is normal. The right ventricular size is normal. There is mildly elevated pulmonary artery systolic pressure.  3. The mitral valve is normal in structure. Moderate mitral valve regurgitation. No evidence of mitral stenosis.  4. There is a large vegetation arising from the noncoronary aortic valve leaflet measuring 1.8 x 1.1 cm. The aortic valve is tricuspid. Aortic valve regurgitation is severe. No aortic stenosis is present.  5. The inferior vena cava is dilated in size with <50% respiratory variability, suggesting right atrial pressure of 15 mmHg. Comparison(s): A prior study was performed on 01/22/2022. Aortic valve vegetation is similar in size to the prior study. However, aortic regurgitation has worsened (now severe). There is also concern for interval development of aorto-RVOT fistula. Conclusion(s)/Recommendation(s): Consider TEE for further evaluation. FINDINGS  Left Ventricle: Left ventricular ejection fraction, by estimation, is 50 to 55%. The left ventricle has low normal function. The left ventricle has no regional wall motion abnormalities. The left ventricular internal cavity size was normal in size. There is borderline left ventricular hypertrophy. Left ventricular diastolic parameters were normal. Right Ventricle: There is turbulent flow in the right ventricular outflow tract just below the pulmonic valve adjacent to the infected aortic valve. While this could represent worsening pulmonic regurgitation, it is concerning for fistula formation from  the left coronary cusp into the RVOT. The right ventricular size is normal. No increase in right ventricular wall thickness. Right ventricular systolic function is normal. There is mildly elevated pulmonary artery systolic pressure. The tricuspid regurgitant velocity is 2.65 m/s, and with an assumed right atrial pressure of 15 mmHg, the estimated right ventricular systolic pressure is 61.9 mmHg.  Left Atrium: Left atrial size was normal in size. Right Atrium: Right atrial size was normal in size. Pericardium: Trivial pericardial effusion is present. Mitral Valve: The mitral valve is normal in structure. Moderate mitral valve regurgitation. No evidence of mitral valve stenosis. Tricuspid Valve: The tricuspid valve is normal in structure. Tricuspid valve regurgitation is mild. Aortic Valve: There is a large vegetation arising from the noncoronary aortic valve leaflet measuring 1.8 x 1.1 cm. The aortic valve is tricuspid. Aortic valve regurgitation is severe. Aortic regurgitation PHT measures 233 msec. No aortic stenosis is present. Aortic valve mean gradient measures 6.0 mmHg. Aortic valve peak gradient measures 12.0 mmHg. Aortic valve area, by VTI measures 1.95 cm. Pulmonic Valve: The pulmonic valve was not well visualized. Pulmonic valve regurgitation is not visualized. No evidence of pulmonic stenosis. Aorta: The aortic root is normal in size and structure. Pulmonary Artery: The pulmonary artery is of normal size. Venous: The inferior vena cava is dilated in size with less than 50% respiratory variability, suggesting right atrial pressure of 15 mmHg. IAS/Shunts: No atrial level shunt detected by color flow Doppler.  LEFT VENTRICLE PLAX 2D LVIDd:         5.10 cm   Diastology LVIDs:         3.30 cm   LV e' medial:    9.79 cm/s LV PW:         1.00 cm   LV E/e' medial:  8.0 LV IVS:        0.90 cm   LV e' lateral:   14.40 cm/s LVOT diam:     1.90 cm   LV E/e' lateral: 5.4 LV SV:         63 LV SV Index:   41 LVOT Area:      2.84 cm  LEFT ATRIUM           Index        RIGHT ATRIUM           Index LA diam:      3.70 cm 2.39 cm/m   RA Area:     10.40 cm LA Vol (A4C): 47.5 ml 30.64 ml/m  RA Volume:   19.10 ml  12.32 ml/m  AORTIC VALVE                     PULMONIC VALVE AV Area (Vmax):    1.90 cm      PV Vmax:       1.05 m/s AV Area (Vmean):   1.85 cm      PV Peak grad:  4.4 mmHg AV Area (VTI):     1.95 cm AV Vmax:           173.00 cm/s AV Vmean:          113.000 cm/s AV VTI:            0.322 m AV Peak Grad:      12.0 mmHg AV Mean Grad:      6.0 mmHg LVOT Vmax:         116.00 cm/s LVOT Vmean:        73.900 cm/s LVOT VTI:          0.222 m LVOT/AV VTI ratio: 0.69 AI PHT:            233 msec  AORTA Ao Root diam: 3.00 cm MITRAL VALVE               TRICUSPID VALVE MV Area (  PHT): 3.12 cm    TR Peak grad:   28.1 mmHg MV Decel Time: 243 msec    TR Vmax:        265.00 cm/s MV E velocity: 78.10 cm/s MV A velocity: 38.80 cm/s  SHUNTS MV E/A ratio:  2.01        Systemic VTI:  0.22 m                            Systemic Diam: 1.90 cm Nelva Bush MD Electronically signed by Nelva Bush MD Signature Date/Time: 02/08/2022/4:21:06 PM    Final    DG Abd 1 View  Result Date: 02/08/2022 CLINICAL DATA:  NG tube placement EXAM: ABDOMEN - 1 VIEW COMPARISON:  01/22/2022 FINDINGS: Tip of enteric tube is seen in the region of distal antrum of the stomach. There is presence of gas in slightly dilated small bowel loops in upper abdomen. Lower abdomen and pelvis are not included in the image and not evaluated. There is prominence of interstitial markings in right lower lung fields. IMPRESSION: Tip of enteric tube is seen in the region of distal antrum of the stomach. Electronically Signed   By: Elmer Picker M.D.   On: 02/08/2022 13:52   MR BRAIN W WO CONTRAST  Addendum Date: 02/08/2022   ADDENDUM REPORT: 02/08/2022 11:20 ADDENDUM: These results were called by telephone at the time of interpretation on 02/08/2022 at 11:20 am to provider  [facility] , who verbally acknowledged these results. Electronically Signed   By: Franchot Gallo M.D.   On: 02/08/2022 11:20   Result Date: 02/08/2022 CLINICAL DATA:  Mental status change. History of IV drug abuse. Profound encephalopathy. EXAM: MRI HEAD WITHOUT AND WITH CONTRAST TECHNIQUE: Multiplanar, multiecho pulse sequences of the brain and surrounding structures were obtained without and with intravenous contrast. CONTRAST:  4 mL Vueway IV COMPARISON:  MRI head 01/22/2022 FINDINGS: Brain: Patchy areas of restricted diffusion in the right parietal lobe. Most of these are stable but there is a area of mild progression in the cortex on diffusion-weighted imaging number 29. Following contrast infusion, there is rim enhancement of these areas. Rim enhancing has become more prominent with thicker enhancement compared to the recent MRI. Ring-enhancing lesions in the right parietal lobe measure 18 mm, 12 mm, and 9 mm in diameter. Surrounding white matter edema is stable. Mild associated hemorrhage in the lesions with slight progression. New areas of restricted diffusion without enhancement in the occipital lobes bilaterally. These are immediately adjacent to the occipital pole and likely are areas of subependymal restricted diffusion. No associated restricted diffusion or hemorrhage. Small areas of restricted diffusion or new in the cerebellum bilaterally without abnormal enhancement. Chronic microhemorrhage left occipital lobe unchanged. New area of microhemorrhage right lateral cerebellum. Small area of subarachnoid hemorrhage right parietal convexity is new. Slight progression of hemorrhage in the right parietal lesions. Ventricle size normal.  No midline shift Vascular: Normal arterial flow voids at the skull base. Skull and upper cervical spine: No focal skeletal lesion. Sinuses/Orbits: Paranasal sinuses clear.  Negative orbit Other: None IMPRESSION: 1. Three ring-enhancing right parietal lobe lesions with  surrounding edema. Enhancement is slightly more prominent. These areas continue to show restricted diffusion with mild progression from the prior study. Mild progression of petechial hemorrhage in the lesions. Given history of IV drug abuse and ring-enhancing lesion progression, brain abscesses are considered most likely. Subacute noninfected infarct considered less likely. 2. 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 3. New small area of microhemorrhage in the right lateral cerebellum. Stable microhemorrhage left occipital lobe. New area of small subarachnoid hemorrhage right parietal convexity Electronically Signed: By: Franchot Gallo M.D. On: 02/08/2022 11:12   CT HEAD WO CONTRAST (5MM)  Result Date: 02/07/2022 CLINICAL DATA:  Initial evaluation for acute trauma, unwitnessed fall. EXAM: CT HEAD WITHOUT CONTRAST TECHNIQUE: Contiguous axial images were obtained from the base of the skull through the vertex without intravenous contrast. RADIATION DOSE REDUCTION: This exam was performed according to the departmental dose-optimization program which includes automated exposure control, adjustment of the mA and/or kV according to patient size and/or use of iterative reconstruction technique. COMPARISON:  Prior MRI from 01/22/2022. 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 No evidence for associated hemorrhage or significant mass effect. No other new large vessel territory infarct. No intracranial hemorrhage elsewhere within the brain. No mass lesion, significant mass effect, or midline shift. No hydrocephalus or extra-axial fluid collection. Vascular: No abnormal hyperdense vessel. Scattered vascular calcifications noted within the carotid siphons. Skull: Scalp soft  tissues demonstrate no acute finding. Calvarium intact. Sinuses/Orbits: Globes orbital soft tissues within normal limits. Paranasal sinuses are clear. No significant mastoid effusion. Other: None. IMPRESSION: 1. 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 No associated hemorrhage or significant regional mass effect. 2. No other new acute intracranial abnormality. No evidence for traumatic injury status post fall. Electronically Signed   By: Jeannine Boga M.D.   On: 02/07/2022 03:15    Microbiology: Results for orders placed or performed during the hospital encounter of 01/21/22  Resp Panel by RT-PCR (Flu A&B, Covid) Urine, Clean Catch     Status: None   Collection Time: 01/22/22  1:50 AM   Specimen: Urine, Clean Catch; Nasal Swab  Result Value Ref Range Status   SARS Coronavirus 2 by RT PCR NEGATIVE NEGATIVE Final    Comment: (NOTE) SARS-CoV-2 target nucleic acids are NOT DETECTED.  The SARS-CoV-2 RNA is generally detectable in upper respiratory specimens during the acute phase of infection. The lowest concentration of SARS-CoV-2 viral copies this assay can detect is 138 copies/mL. A negative result does not preclude SARS-Cov-2 infection and should not be used as the sole basis for treatment or other patient management decisions. A negative result may occur with  improper specimen collection/handling, submission of specimen other than nasopharyngeal swab, presence of viral mutation(s) within the areas targeted by this assay, and inadequate number of viral copies(<138 copies/mL). A negative result must be combined with clinical observations, patient history, and epidemiological information. The expected result is Negative.  Fact Sheet for Patients:  EntrepreneurPulse.com.au  Fact Sheet for Healthcare Providers:   IncredibleEmployment.be  This test is no t yet approved or cleared by the [place] FDA and  has been authorized for detection and/or diagnosis of SARS-CoV-2 by FDA under an Emergency Use Authorization (EUA). This EUA will remain  in effect (meaning this test can be used) for the duration of the COVID-19 declaration under Section 564(b)(1) of the Act, 21 U.S.C.section 360bbb-3(b)(1), unless the authorization is terminated  or revoked sooner.       Influenza A by PCR NEGATIVE NEGATIVE Final   Influenza B by PCR NEGATIVE NEGATIVE Final    Comment: (NOTE) The  Xpert Xpress SARS-CoV-2/FLU/RSV plus assay is intended as an aid in the diagnosis of influenza from Nasopharyngeal swab specimens and should not be used as a sole basis for treatment. Nasal washings and aspirates are unacceptable for Xpert Xpress SARS-CoV-2/FLU/RSV testing.  Fact Sheet for Patients: EntrepreneurPulse.com.au  Fact Sheet for Healthcare Providers: IncredibleEmployment.be  This test is not yet approved or cleared by the [place] FDA and has been authorized for detection and/or diagnosis of SARS-CoV-2 by FDA under an Emergency Use Authorization (EUA). This EUA will remain in effect (meaning this test can be used) for the duration of the COVID-19 declaration under Section 564(b)(1) of the Act, 21 U.S.C. section 360bbb-3(b)(1), unless the authorization is terminated or revoked.  Performed at [facility], [address] [place], [place] [address]   Urine Culture     Status: Abnormal   Collection Time: 01/22/22  1:50 AM   Specimen: In/Out Cath Urine  Result Value Ref Range Status   Specimen Description   Final    IN/OUT CATH URINE Performed at [facility], [address] [place], [place] [address]    Special Requests   Final    NONE Performed at [facility], [address] [place], [place] [address]     Culture MULTIPLE SPECIES PRESENT, SUGGEST RECOLLECTION (A)  Final   Report Status 01/23/2022 FINAL  Final  Blood Culture (routine x 2)     Status: Abnormal   Collection Time: 01/22/22  2:55 AM   Specimen: BLOOD  Result Value Ref Range Status   Specimen Description   Final    BLOOD LEFT ANTECUBITAL Performed at [facility], [address] [place], [place] [address]    Special Requests   Final    BOTTLES DRAWN AEROBIC AND ANAEROBIC Blood Culture results may not be optimal due to an inadequate volume of blood received in culture bottles Performed at [facility], [address] [place], [place] [address]    Culture  Setup Time   Final    GRAM NEGATIVE RODS IN BOTH AEROBIC AND ANAEROBIC BOTTLES CRITICAL RESULT CALLED TO, READ BACK BY AND VERIFIED WITH: RACHEL RODRIGUEZ GUZMAN 01/22/22 @ 1248 BY SB    Culture SERRATIA MARCESCENS (A)  Final   Report Status 01/24/2022 FINAL  Final   Organism ID, Bacteria SERRATIA MARCESCENS  Final      Susceptibility   Serratia marcescens - MIC*    CEFAZOLIN >=64 RESISTANT Resistant     CEFEPIME <=0.12 SENSITIVE Sensitive     CEFTAZIDIME <=1 SENSITIVE Sensitive     CEFTRIAXONE <=0.25 SENSITIVE Sensitive     CIPROFLOXACIN <=0.25 SENSITIVE Sensitive     GENTAMICIN <=1 SENSITIVE Sensitive     TRIMETH/SULFA <=20 SENSITIVE Sensitive     * SERRATIA MARCESCENS  Blood Culture ID Panel (Reflexed)     Status: Abnormal   Collection Time: 01/22/22  2:55 AM  Result Value Ref Range Status   Enterococcus faecalis NOT DETECTED NOT DETECTED Final   Enterococcus Faecium NOT DETECTED NOT DETECTED Final   Listeria monocytogenes NOT DETECTED NOT DETECTED Final   Staphylococcus species NOT DETECTED NOT DETECTED Final   Staphylococcus aureus (BCID) NOT DETECTED NOT DETECTED Final   Staphylococcus epidermidis NOT DETECTED NOT DETECTED Final   Staphylococcus lugdunensis NOT DETECTED NOT DETECTED Final   Streptococcus species NOT DETECTED NOT DETECTED  Final   Streptococcus agalactiae NOT DETECTED NOT DETECTED Final   Streptococcus pneumoniae NOT DETECTED NOT DETECTED Final   Streptococcus pyogenes NOT DETECTED  NOT DETECTED Final   A.calcoaceticus-baumannii NOT DETECTED NOT DETECTED Final   Bacteroides fragilis NOT DETECTED NOT DETECTED Final   Enterobacterales DETECTED (A) NOT DETECTED Final    Comment: Enterobacterales represent a large order of gram negative bacteria, not a single organism. CRITICAL RESULT CALLED TO, READ BACK BY AND VERIFIED WITH: RACHEL RODRIGUES GUZMAN 01/22/22@ 1248 BY SB    Enterobacter cloacae complex NOT DETECTED NOT DETECTED Final   Escherichia coli NOT DETECTED NOT DETECTED Final   Klebsiella aerogenes NOT DETECTED NOT DETECTED Final   Klebsiella oxytoca NOT DETECTED NOT DETECTED Final   Klebsiella pneumoniae NOT DETECTED NOT DETECTED Final   Proteus species NOT DETECTED NOT DETECTED Final   Salmonella species NOT DETECTED NOT DETECTED Final   Serratia marcescens DETECTED (A) NOT DETECTED Corrected    Comment: CRITICAL RESULT CALLED TO, READ BACK BY AND VERIFIED WITH: RACHEL RODRIGUES GUZMAN 01/22/22@ 1248 BY SB CORRECTED ON 09/04 AT 0420: PREVIOUSLY REPORTED AS DETECTED RBV RACHEL RODRIGUES GUZMAN 01/22/22@ 1248 BY SB    Haemophilus influenzae NOT DETECTED NOT DETECTED Final   Neisseria meningitidis NOT DETECTED NOT DETECTED Final   Pseudomonas aeruginosa NOT DETECTED NOT DETECTED Final   Stenotrophomonas maltophilia NOT DETECTED NOT DETECTED Final   Candida albicans NOT DETECTED NOT DETECTED Final   Candida auris NOT DETECTED NOT DETECTED Final   Candida glabrata NOT DETECTED NOT DETECTED Final   Candida krusei NOT DETECTED NOT DETECTED Final   Candida parapsilosis NOT DETECTED NOT DETECTED Final   Candida tropicalis NOT DETECTED NOT DETECTED Final   Cryptococcus neoformans/gattii NOT DETECTED NOT DETECTED Final   CTX-M ESBL NOT DETECTED NOT DETECTED Final   Carbapenem resistance IMP NOT DETECTED NOT  DETECTED Final   Carbapenem resistance KPC NOT DETECTED NOT DETECTED Final   Carbapenem resistance NDM NOT DETECTED NOT DETECTED Final   Carbapenem resist OXA 48 LIKE NOT DETECTED NOT DETECTED Final   Carbapenem resistance VIM NOT DETECTED NOT DETECTED Final    Comment: Performed at [facility], [address] [place], [place] [address]  Blood Culture (routine x 2)     Status: Abnormal   Collection Time: 01/22/22  3:40 AM   Specimen: BLOOD  Result Value Ref Range Status   Specimen Description   Final    BLOOD BLOOD LEFT ARM Performed at [facility], [address] [place], [place] [address]    Special Requests   Final    BOTTLES DRAWN AEROBIC AND ANAEROBIC Blood Culture results may not be optimal due to an excessive volume of blood received in culture bottles Performed at [facility], [address] [place], [place] [address]    Culture  Setup Time   Final    GRAM NEGATIVE RODS IN BOTH AEROBIC AND ANAEROBIC BOTTLES CRITICAL RESULT CALLED TO, READ BACK BY AND VERIFIED WITH: RACHEL RODRIGUEZ GUZMAN 01/22/22 @ 1245 BY SB Performed at [facility], [address] [place], [place] [address]    Culture (A)  Final    SERRATIA MARCESCENS SUSCEPTIBILITIES PERFORMED ON PREVIOUS CULTURE WITHIN THE LAST 5 DAYS. Performed at [facility], [place] [place], [place] [address]    Report Status 01/24/2022 FINAL  Final  MRSA Next Gen by PCR, Nasal     Status: None   Collection Time: 01/22/22  8:44 PM   Specimen: Nasal Mucosa; Nasal Swab  Result Value Ref Range Status   MRSA by PCR Next Gen NOT DETECTED NOT DETECTED Final    Comment: (  NOTE) The GeneXpert MRSA Assay (FDA approved for NASAL specimens only), is one component of a comprehensive MRSA colonization surveillance program. It is not intended to diagnose MRSA infection nor to guide or monitor treatment for MRSA infections. Test performance is not FDA approved in patients less than 26  years old. Performed at [facility], [address] [place], [place] [address]   Culture, blood (Routine X 2) w Reflex to ID Panel     Status: None   Collection Time: 01/26/22  8:09 AM   Specimen: BLOOD  Result Value Ref Range Status   Specimen Description BLOOD LEFT ARM  Final   Special Requests   Final    AEROBIC BOTTLE ONLY Blood Culture results may not be optimal due to an excessive volume of blood received in culture bottles   Culture   Final    NO GROWTH 5 DAYS Performed at [facility], [address] [place], [place] [address]    Report Status 01/31/2022 FINAL  Final  Culture, blood (Routine X 2) w Reflex to ID Panel     Status: None   Collection Time: 01/26/22 10:14 AM   Specimen: BLOOD RIGHT HAND  Result Value Ref Range Status   Specimen Description BLOOD RIGHT HAND  Final   Special Requests   Final    BOTTLES DRAWN AEROBIC AND ANAEROBIC Blood Culture adequate volume   Culture   Final    NO GROWTH 5 DAYS Performed at [facility], [address] [place], [place] [address]    Report Status 01/31/2022 FINAL  Final    Labs: CBC: Recent Labs  Lab 02/21/22 0523 02/22/22 0509  WBC 11.4* 10.9*  HGB 10.2* 9.1*  HCT 32.4* 29.0*  MCV 86.2 84.8  PLT 213 597   Basic Metabolic Panel: Recent Labs  Lab 02/21/22 0523 02/22/22 0509 02/24/22 0610  NA 128* 130*  --   K 4.6 4.8  --   CL 98 102  --   CO2 24 26  --   GLUCOSE 84 90  --   BUN 13 15  --   CREATININE 0.73 0.70  --   CALCIUM 8.1* 8.3*  --   MG 2.3 2.3 2.5*   Liver Function Tests: No results for input(s): "AST", "ALT", "ALKPHOS", "BILITOT", "PROT", "ALBUMIN" in the last 168 hours. CBG: No results for input(s): "GLUCAP" in the last 168 hours.  Discharge time spent: greater than 30 minutes.  Signed: Fritzi Mandes, MD [facility] 02/25/2022

## 2022-03-22 DEATH — deceased

## 2022-11-10 ENCOUNTER — Other Ambulatory Visit (HOSPITAL_BASED_OUTPATIENT_CLINIC_OR_DEPARTMENT_OTHER): Payer: Self-pay
